# Patient Record
Sex: Male | Born: 1945
Health system: Southern US, Community
[De-identification: ages and names within clinical notes are randomized; demographics above are authoritative.]

## PROBLEM LIST (undated history)

## (undated) DIAGNOSIS — Z720 Tobacco use: Secondary | ICD-10-CM

## (undated) DIAGNOSIS — I2510 Atherosclerotic heart disease of native coronary artery without angina pectoris: Secondary | ICD-10-CM

## (undated) DIAGNOSIS — M51369 Other intervertebral disc degeneration, lumbar region without mention of lumbar back pain or lower extremity pain: Secondary | ICD-10-CM

## (undated) DIAGNOSIS — Z9289 Personal history of other medical treatment: Secondary | ICD-10-CM

## (undated) DIAGNOSIS — R0602 Shortness of breath: Secondary | ICD-10-CM

## (undated) DIAGNOSIS — I10 Essential (primary) hypertension: Secondary | ICD-10-CM

## (undated) DIAGNOSIS — I209 Angina pectoris, unspecified: Secondary | ICD-10-CM

## (undated) DIAGNOSIS — I219 Acute myocardial infarction, unspecified: Secondary | ICD-10-CM

## (undated) DIAGNOSIS — H269 Unspecified cataract: Secondary | ICD-10-CM

## (undated) DIAGNOSIS — E785 Hyperlipidemia, unspecified: Secondary | ICD-10-CM

## (undated) DIAGNOSIS — E663 Overweight: Secondary | ICD-10-CM

## (undated) DIAGNOSIS — I429 Cardiomyopathy, unspecified: Secondary | ICD-10-CM

## (undated) DIAGNOSIS — I739 Peripheral vascular disease, unspecified: Secondary | ICD-10-CM

## (undated) DIAGNOSIS — M5136 Other intervertebral disc degeneration, lumbar region: Secondary | ICD-10-CM

## (undated) HISTORY — DX: Cardiomyopathy, unspecified: I42.9

## (undated) HISTORY — DX: Peripheral vascular disease, unspecified: I73.9

## (undated) HISTORY — PX: OTHER SURGICAL HISTORY: SHX169

## (undated) HISTORY — DX: Atherosclerotic heart disease of native coronary artery without angina pectoris: I25.10

## (undated) HISTORY — DX: Tobacco use: Z72.0

## (undated) HISTORY — DX: Acute myocardial infarction, unspecified: I21.9

## (undated) HISTORY — DX: Overweight: E66.3

## (undated) HISTORY — PX: FEMORAL-FEMORAL BYPASS GRAFT: SHX936

## (undated) HISTORY — PX: CORONARY ANGIOPLASTY WITH STENT PLACEMENT: SHX49

## (undated) HISTORY — DX: Hyperlipidemia, unspecified: E78.5

## (undated) HISTORY — PX: KNEE SURGERY: SHX244

## (undated) HISTORY — PX: SPINE SURGERY: SHX786

## (undated) HISTORY — PX: COLONOSCOPY: SHX174

---

## 2001-02-25 ENCOUNTER — Inpatient Hospital Stay (HOSPITAL_COMMUNITY): Admission: RE | Admit: 2001-02-25 | Discharge: 2001-02-26 | Payer: Self-pay | Admitting: *Deleted

## 2001-02-25 ENCOUNTER — Encounter (INDEPENDENT_AMBULATORY_CARE_PROVIDER_SITE_OTHER): Payer: Self-pay | Admitting: *Deleted

## 2003-02-22 ENCOUNTER — Ambulatory Visit (HOSPITAL_COMMUNITY): Admission: RE | Admit: 2003-02-22 | Discharge: 2003-02-24 | Payer: Self-pay | Admitting: Cardiology

## 2003-09-15 ENCOUNTER — Ambulatory Visit (HOSPITAL_COMMUNITY): Admission: RE | Admit: 2003-09-15 | Discharge: 2003-09-15 | Payer: Self-pay | Admitting: Vascular Surgery

## 2003-09-17 ENCOUNTER — Emergency Department (HOSPITAL_COMMUNITY): Admission: EM | Admit: 2003-09-17 | Discharge: 2003-09-17 | Payer: Self-pay

## 2003-09-28 ENCOUNTER — Inpatient Hospital Stay (HOSPITAL_COMMUNITY): Admission: RE | Admit: 2003-09-28 | Discharge: 2003-10-06 | Payer: Self-pay | Admitting: Vascular Surgery

## 2004-03-30 ENCOUNTER — Ambulatory Visit: Payer: Self-pay | Admitting: Cardiology

## 2004-07-26 ENCOUNTER — Ambulatory Visit (HOSPITAL_COMMUNITY): Admission: RE | Admit: 2004-07-26 | Discharge: 2004-07-26 | Payer: Self-pay | Admitting: Vascular Surgery

## 2004-07-30 ENCOUNTER — Inpatient Hospital Stay (HOSPITAL_COMMUNITY): Admission: RE | Admit: 2004-07-30 | Discharge: 2004-08-02 | Payer: Self-pay | Admitting: Vascular Surgery

## 2004-07-30 ENCOUNTER — Ambulatory Visit: Payer: Self-pay | Admitting: Cardiology

## 2004-09-05 ENCOUNTER — Ambulatory Visit: Payer: Self-pay | Admitting: Cardiology

## 2004-10-22 ENCOUNTER — Ambulatory Visit: Payer: Self-pay | Admitting: Cardiology

## 2005-05-27 ENCOUNTER — Ambulatory Visit: Payer: Self-pay | Admitting: Cardiology

## 2006-01-23 ENCOUNTER — Ambulatory Visit: Payer: Self-pay | Admitting: Cardiology

## 2006-08-21 ENCOUNTER — Encounter: Payer: Self-pay | Admitting: Physician Assistant

## 2006-08-21 ENCOUNTER — Ambulatory Visit: Payer: Self-pay | Admitting: Cardiology

## 2006-08-27 ENCOUNTER — Ambulatory Visit: Payer: Self-pay | Admitting: Cardiology

## 2007-01-13 ENCOUNTER — Ambulatory Visit: Payer: Self-pay | Admitting: Cardiology

## 2007-05-05 ENCOUNTER — Ambulatory Visit: Payer: Self-pay | Admitting: Internal Medicine

## 2007-05-05 ENCOUNTER — Encounter: Payer: Self-pay | Admitting: Internal Medicine

## 2007-05-05 ENCOUNTER — Ambulatory Visit (HOSPITAL_COMMUNITY): Admission: RE | Admit: 2007-05-05 | Discharge: 2007-05-05 | Payer: Self-pay | Admitting: Internal Medicine

## 2007-05-05 HISTORY — PX: OTHER SURGICAL HISTORY: SHX169

## 2007-10-21 ENCOUNTER — Encounter: Payer: Self-pay | Admitting: Cardiology

## 2007-11-27 ENCOUNTER — Ambulatory Visit: Payer: Self-pay | Admitting: Cardiology

## 2007-11-27 ENCOUNTER — Encounter: Payer: Self-pay | Admitting: Physician Assistant

## 2007-12-29 ENCOUNTER — Ambulatory Visit: Payer: Self-pay | Admitting: Vascular Surgery

## 2008-07-27 ENCOUNTER — Ambulatory Visit: Payer: Self-pay | Admitting: Cardiology

## 2008-09-26 ENCOUNTER — Encounter: Payer: Self-pay | Admitting: Cardiology

## 2008-11-23 ENCOUNTER — Encounter: Payer: Self-pay | Admitting: Physician Assistant

## 2009-02-16 ENCOUNTER — Encounter: Payer: Self-pay | Admitting: Cardiology

## 2009-02-16 DIAGNOSIS — E663 Overweight: Secondary | ICD-10-CM | POA: Insufficient documentation

## 2009-02-16 DIAGNOSIS — I428 Other cardiomyopathies: Secondary | ICD-10-CM

## 2009-02-16 DIAGNOSIS — E785 Hyperlipidemia, unspecified: Secondary | ICD-10-CM

## 2009-02-16 DIAGNOSIS — I739 Peripheral vascular disease, unspecified: Secondary | ICD-10-CM | POA: Insufficient documentation

## 2009-02-16 DIAGNOSIS — I2510 Atherosclerotic heart disease of native coronary artery without angina pectoris: Secondary | ICD-10-CM | POA: Insufficient documentation

## 2009-02-17 ENCOUNTER — Ambulatory Visit: Payer: Self-pay | Admitting: Cardiology

## 2009-02-17 ENCOUNTER — Encounter: Payer: Self-pay | Admitting: Physician Assistant

## 2009-02-17 DIAGNOSIS — R0602 Shortness of breath: Secondary | ICD-10-CM

## 2009-02-17 DIAGNOSIS — R29898 Other symptoms and signs involving the musculoskeletal system: Secondary | ICD-10-CM

## 2009-02-17 DIAGNOSIS — E7800 Pure hypercholesterolemia, unspecified: Secondary | ICD-10-CM | POA: Insufficient documentation

## 2009-02-17 DIAGNOSIS — R609 Edema, unspecified: Secondary | ICD-10-CM

## 2009-02-17 DIAGNOSIS — Z9861 Coronary angioplasty status: Secondary | ICD-10-CM

## 2009-02-17 DIAGNOSIS — I10 Essential (primary) hypertension: Secondary | ICD-10-CM | POA: Insufficient documentation

## 2009-02-17 DIAGNOSIS — I70299 Other atherosclerosis of native arteries of extremities, unspecified extremity: Secondary | ICD-10-CM

## 2009-02-17 DIAGNOSIS — I2510 Atherosclerotic heart disease of native coronary artery without angina pectoris: Secondary | ICD-10-CM

## 2009-02-20 ENCOUNTER — Encounter (INDEPENDENT_AMBULATORY_CARE_PROVIDER_SITE_OTHER): Payer: Self-pay | Admitting: *Deleted

## 2009-10-11 ENCOUNTER — Ambulatory Visit: Payer: Self-pay | Admitting: Cardiology

## 2009-10-26 ENCOUNTER — Encounter (INDEPENDENT_AMBULATORY_CARE_PROVIDER_SITE_OTHER): Payer: Self-pay | Admitting: *Deleted

## 2009-10-27 ENCOUNTER — Encounter: Payer: Self-pay | Admitting: Cardiology

## 2009-11-03 ENCOUNTER — Encounter (INDEPENDENT_AMBULATORY_CARE_PROVIDER_SITE_OTHER): Payer: Self-pay | Admitting: *Deleted

## 2010-06-19 NOTE — Letter (Signed)
Summary: Generic Engineer, agricultural at [facility] [address]   [place], [place] [address]   Phone: [phone]  Fax: [phone]        October 26, 2009 MRN: [id number]    GEVORK AYYAD [address] [place], [place]  [address]    Dear Mr. Picha,    You were asked to have fasting lab work done following your May 25th office visit to check you cholesterol and liver function. It does not appear this lab work has been completed.  Please, take the enclosed order to the [facility] to have this lab work done as soon as possible. Do not eat or drink after midnight.   If you will not be able to complete this lab work, please notify our office so that we can properly document this in your chart.      Sincerely,  Cyril Loosen, RN, BSN  This letter has been electronically signed by your physician.

## 2010-06-19 NOTE — Letter (Signed)
Summary: Generic Engineer, agricultural at [facility] [address]. [address]   [place], [place] [address]   Phone: [phone]  Fax: [phone]        November 03, 2009 MRN: [id number]    Logan [name] [address] [place], [place]  [address]    Dear Mr. Saddler,   Enclosed you will find information regarding lowering your triglycerides and increasing your HDL as we discussed on Friday, November 03, 2009.      Sincerely,  Cyril Loosen, RN, BSN  This letter has been electronically signed by your physician.

## 2010-06-19 NOTE — Assessment & Plan Note (Signed)
Summary: 6 MO FU PER APRIL REMINDER-SRS  Medications Added METOPROLOL TARTRATE 50 MG TABS (METOPROLOL TARTRATE) Take 1 tablet by mouth two times a day LISINOPRIL 40 MG TABS (LISINOPRIL) Take 1 tablet by mouth once a day FISH OIL 1000 MG CAPS (OMEGA-3 FATTY ACIDS) Take 2 tablet by mouth two times a day SIMVASTATIN 20 MG TABS (SIMVASTATIN) Take 1 tablet by mouth once a day SMZ-TMP DS 800-160 MG TABS (SULFAMETHOXAZOLE-TRIMETHOPRIM) Take 1 tablet by mouth two times a day      Allergies Added:   Visit Type:  Follow-up Primary Provider:  Kirstie Peri, MD  CC:  CAD.  History of Present Illness: The patient is seen for followup of coronary disease.  I saw him last in October, 2010.  Is not having chest pain.  He does have some discomfort in his legs when walking but overall he is relatively stable.  Preventive Screening-Counseling & Management  Alcohol-Tobacco     Smoking Status: current     Smoking Cessation Counseling: yes     Packs/Day: 1 PPD  Current Medications (verified): 1)  Metoprolol Tartrate 50 Mg Tabs (Metoprolol Tartrate) .... Take 1 Tablet By Mouth Two Times A Day 2)  Metformin Hcl 500 Mg Tabs (Metformin Hcl) .... 2 Tabs Two Times A Day 3)  Glimepiride 4 Mg Tabs (Glimepiride) .... Two Times A Day 4)  Lisinopril 40 Mg Tabs (Lisinopril) .... Take 1 Tablet By Mouth Once A Day 5)  Actos 45 Mg Tabs (Pioglitazone Hcl) .... Once Daily 6)  Aspirin 81 Mg Tbec (Aspirin) .... Take One Tablet By Mouth Daily 7)  Lantus 100 Unit/ml Soln (Insulin Glargine) .... As Directed 8)  Fish Oil 1000 Mg Caps (Omega-3 Fatty Acids) .... Take 2 Tablet By Mouth Two Times A Day 9)  Multivitamins   Tabs (Multiple Vitamin) .... Once Daily 10)  Simvastatin 20 Mg Tabs (Simvastatin) .... Take 1 Tablet By Mouth Once A Day 11)  Smz-Tmp Ds 800-160 Mg Tabs (Sulfamethoxazole-Trimethoprim) .... Take 1 Tablet By Mouth Two Times A Day  Allergies (verified): 1)  ! Pcn 2)  ! Asa  Comments:  Nurse/Medical  Assistant: The patient's medications and allergies were reviewed with the patient and were updated in the Medication and Allergy Lists. Bottles reviewed.  Past History:  Past Medical History: Last updated: 02/17/2009  OVERWEIGHT (ICD-278.02) DYSLIPIDEMIA (ICD-272.4) CARDIOMYOPATHY (ICD-425.4)...ejection fraction 35% in the past improved to 50% by echo April, 2000 and DM (ICD-250.00) CAD (ICD-414.00)... taxis DES stent circumflex October, 2004.. residual total distal circumflex and 50% LAD / Cardiolite April, 2008.. inferior scar no ischemia PERIPHERAL VASCULAR DISEASE (ICD-443.9)..right to left fem-fem bypass 1997.../ aortobifemoral bypass 2005../ right to left fem-fem bypass March 2006.  Secondary to occluded right limb of aortobifemoral bypass.. tobacco abuse  Social History: Smoking Status:  current Packs/Day:  1 PPD  Review of Systems       Patient denies fever, chills, headache, sweats, rash, change in vision, change in hearing, chest pain, cough, shortness of breath, nausea vomiting, urinary symptoms.  All of the systems are reviewed and are negative  Vital Signs:  Patient profile:   65 year old male Height:      69 inches Weight:      237 pounds Pulse rate:   91 / minute BP sitting:   110 / 71  (left arm) Cuff size:   large  Vitals Entered By: Carlye Grippe (Oct 11, 2009 12:41 PM) CC: CAD Comments Pt requesting refill on Simvastatin. No FLP/LFT done per pt  since Sept. Pt will have labs done.    Physical Exam  General:  patient stable. Eyes:  no xanthelasma. Neck:  no jugular venous distention. Lungs:  lungs are clear respiratory effort is normal. Heart:  cardiac exam reveals S1 and S2.  There no clicks or significant. Abdomen:  abdomen soft. Msk:  no musculoskeletal deformities. Extremities:  no peripheral edema. Psych:  patient is oriented to person time and place affect is normal.   Impression & Recommendations:  Problem # 1:  EDEMA (ICD-782.3) Patient  is not having significant edema.  No change in therapy.  I did talk to him before but be careful with salt and fluid intake.  Problem # 2:  HYPERTENSION (ICD-401.9) Blood pressure is controlled.  No change in therapy.  Problem # 3:  SHORTNESS OF BREATH (ICD-786.05) Patient is not having any shortness of breath at this time.  No change in therapy.  Problem # 4:  CORONARY ATHEROSCLEROSIS NATIVE CORONARY ARTERY (ICD-414.01) Coronary disease is stable.  No further workup needed at this time.  Problem # 5:  DYSLIPIDEMIA (ICD-272.4)  His updated medication list for this problem includes:    Simvastatin 20 Mg Tabs (Simvastatin) .Marland Kitchen... Take 1 tablet by mouth once a day Patient is receiving meds for his lipids.  Problem # 6:  PERIPHERAL VASCULAR DISEASE (ICD-443.9) The patient's peripheral disease stable.  I reminded him that walking is very good for his problem.  Other Orders: T-Lipid Profile [id number]) T-Hepatic Function [phone])  Patient Instructions: 1)  Your physician wants you to follow-up in: 9 months. You will receive a reminder letter in the mail one-two months in advance. If you don't receive a letter, please call our office to schedule the follow-up appointment. 2)  Your physician recommends that you continue on your current medications as directed. Please refer to the Current Medication list given to you today. Prescriptions: SIMVASTATIN 20 MG TABS (SIMVASTATIN) Take 1 tablet by mouth once a day  #30 x 0   Entered by:   Cyril Loosen, RN, BSN   Authorized by:   Talitha Givens, MD, [facility]   Signed by:   Cyril Loosen, RN, BSN on 10/11/2009   Method used:   Electronically to        [facility] (retail)       [address]       [place], [place]  [address]       Ph: [phone]       Fax: [phone]   RxID:   [id number]

## 2010-07-23 ENCOUNTER — Ambulatory Visit (INDEPENDENT_AMBULATORY_CARE_PROVIDER_SITE_OTHER): Payer: Medicare HMO | Admitting: Cardiology

## 2010-07-23 ENCOUNTER — Encounter: Payer: Self-pay | Admitting: Cardiology

## 2010-07-23 DIAGNOSIS — I2510 Atherosclerotic heart disease of native coronary artery without angina pectoris: Secondary | ICD-10-CM

## 2010-07-23 DIAGNOSIS — F17200 Nicotine dependence, unspecified, uncomplicated: Secondary | ICD-10-CM | POA: Insufficient documentation

## 2010-07-31 NOTE — Assessment & Plan Note (Signed)
Summary: [name]  Medications Added HYDROCHLOROTHIAZIDE 25 MG TABS (HYDROCHLOROTHIAZIDE) Take 1 tablet by mouth once a day        Visit Type:  Follow-up Primary Provider:  Kirstie Peri, MD  CC:  CAD.  History of Present Illness: The patient is seen for followup of coronary artery disease.  Is not have any chest pain.  He is functioning adequately with his peripheral vascular disease.  Preventive Screening-Counseling & Management  Alcohol-Tobacco     Smoking Status: current     Smoking Cessation Counseling: yes     Packs/Day: 1/2 PPD  Allergies: 1)  ! Pcn 2)  ! [name]  Past History:  Past Medical History:  OVERWEIGHT (ICD-278.02) DYSLIPIDEMIA (ICD-272.4) CARDIOMYOPATHY (ICD-425.4)...ejection fraction 35% in the past improved to 50% by echo April, 2000 and DM (ICD-250.00) CAD (ICD-414.00)... taxis DES stent circumflex October, 2004.. residual total distal circumflex and 50% LAD / Cardiolite April, 2008.. inferior scar no ischemia PERIPHERAL VASCULAR DISEASE (ICD-443.9)..right to left fem-fem bypass 1997.../ aortobifemoral bypass 2005../ right to left fem-fem bypass March 2006.  Secondary to occluded right limb of aortobifemoral bypass.. tobacco abuse..  Social History: Packs/Day:  1/2 PPD  Review of Systems       Patient denies fever, chills, headache, sweats, rash, change in vision, change in hearing, chest pain, cough, nausea vomiting, urinary symptoms.  All of the systems are reviewed and are negative.  Vital Signs:  Patient profile:   65 year old male Height:      69 inches Weight:      244 pounds BMI:     36.16 Pulse rate:   92 / minute BP sitting:   120 / 79  (left arm) Cuff size:   large  Vitals Entered By: Carlye Grippe (July 23, 2010 3:18 PM)  Nutrition Counseling: Patient's BMI is greater than 25 and therefore counseled on weight management options.  Physical Exam  General:  The patient is stable. Eyes:  no xanthelasma. Neck:  no jugular venous  distention. Lungs:  lungs are clear.  Respiratory effort is nonlabored. Heart:  cardiac exam reveals S1-S2.  No clicks or significant murmurs. Abdomen:  abdomen soft. Extremities:  no peripheral edema. Psych:  patient is oriented to person time and place.  Affect is normal.   Impression & Recommendations:  Problem # 1:  EDEMA (ICD-782.3) no significant edema.  No change in therapy.  Problem # 2:  HYPERTENSION (ICD-401.9) Blood pressure control.  No change in therapy.  Problem # 3:  CORONARY ATHEROSCLEROSIS NATIVE CORONARY ARTERY (ICD-414.01) Coronary disease is stable.  EKG is done and reviewed by me.  There is no significant abnormality.  Further testing is needed.  Problem # 4:  DYSLIPIDEMIA (ICD-272.4) the patient's lipids are treated.  No change in therapy.  Problem # 5:  DYSLIPIDEMIA (ICD-272.4) The patient's LDL is treated.  Labs have been checked within the past year revealing some elevation of his triglycerides.  I have spoken with him about his diet.  Problem # 6:  TOBACCO ABUSE (ICD-305.1) The patient continues to smoke one half pack per day.  Counseled him to stop.  Other Orders: EKG w/ Interpretation (93000)  Patient Instructions: 1)  Your physician wants you to follow-up in: 1 year. You will receive a reminder letter in the mail one-two months in advance. If you don't receive a letter, please call our office to schedule the follow-up appointment. 2)  Your physician recommends that you continue on your current medications as directed. Please refer to  the Current Medication list given to you today.

## 2010-10-02 NOTE — Assessment & Plan Note (Signed)
[name]                          [facility] OFF[facility] NOTE   EREZ, MCCALLUM                         MRN:          [id number]  DATE:07/27/2008                            DOB:          [birth year]    Logan [name] is stable.  He has known coronary artery disease and  significant peripheral vascular disease.  He does have  hypercholesterolemia.  Crestor has become too expensive for him and we  are suggesting that it be changed to simvastatin.  He did have some  problems taking Pravachol in the past.  He will have to be followed to  be sure that he feels well on simvastatin.   The patient is not having any significant chest pain.  He has no  significant shortness of breath.  He is going about full activities.   PAST MEDICAL HISTORY:   ALLERGIES:  PENICILLIN.  He tolerates low-dose aspirin.   MEDICATIONS:  Metoprolol, Crestor (to be switched to simvastatin),  metformin, glimepiride, lisinopril, Actos, aspirin, Lantus, fish oil,  and multivitamin.   OTHER MEDICAL PROBLEMS:  See the list on the note of November 27, 2007.   REVIEW OF SYSTEMS:  He is not having any fevers or chills.  He has no  skin rashes.  He has no GI or GU symptoms.  There are no headaches.   REVIEW OF SYSTEMS:  Otherwise is negative.   PHYSICAL EXAMINATION:  Blood pressure is 135/81 with a pulse of 85.  His  weight is up to 252.  The patient is oriented to person, time, and place.  Affect is normal.  HEENT:  No xanthelasma.  He has normal extraocular motion.  There are no  carotid bruits.  There is no jugular venous distention.  LUNGS:  Clear.  Respiratory effort is not labored.  CARDIAC:  S1 and S2.  There are no clicks or significant murmurs.  ABDOMEN:  Soft.  There is no significant peripheral edema.   No labs were done today.   Problems are listed extensively on the note of November 27, 2007.  I had a  long discussion with him about his medications.  His coronary artery  disease is stable.  He does not need any further testing.  Hyperlipidemia has been treated with Crestor.  He cannot afford it and  he will be  switched to simvastatin.  He will need followup labs.  I will see him  back for Cardiology followup in 6 months.     Luis Abed, MD, [facility]  Electronically Signed    JDK/MedQ  DD: 07/27/2008  DT: 07/28/2008  Job #: [id number]   cc:   Kirstie Peri, MD

## 2010-10-02 NOTE — Assessment & Plan Note (Signed)
[facility]                          [facility] NOTE   Logan [name], Logan [name]                         MRN:          [id number]  DATE:11/27/2007                            DOB:          [birth year]    CARDIOLOGIST:  Luis Abed, MD, [facility].   PRIMARY CARE PHYSICIAN:  Kirstie Peri, MD.   REASON FOR FOLLOWUP:  A 43-month followup.   HISTORY OF PRESENT ILLNESS:  Logan [name] is a 65 year old male patient  with a history of significant peripheral arterial disease as well as  coronary artery disease who presents to the office day for followup.  He  last saw Dr. Myrtis Ser in August 2008.  At that time, he was doing well.  Since last being seen, he denies any development of exertional chest  discomfort.  He does have exertional shortness of breath.  He describes  NYHA class IIB symptoms.  He denies any significant change.  He denies  orthopnea, PND, or pedal edema.  He denies syncope or near syncope.  He  does note some bilateral lower extremity numbness with exertion that  seems to go away with rest.  He says that these are similar symptoms to  what he had prior to his revascularization surgery for his lower  extremity peripheral arterial disease.   CURRENT MEDICATIONS:  1. Metformin 500 mg 2 tablets b.i.d.  2. Actos 45 mg daily.  3. Aspirin 81 mg daily.  4. Metoprolol 25 mg b.i.d.  5. Crestor 10 mg nightly.  6. Fish oil 1000 mg 2 tablets b.i.d.  7. Glimepiride 4 mg b.i.d.  8. Lantus insulin 65 units daily.   PHYSICAL EXAMINATION:  GENERAL:  He is a well nourished and well  developed man in no distress.  VITAL SIGNS:  Blood pressure 144/88, pulse 78, and weight 246.8 pounds.  HEENT:  Normal.  NECK:  Without JVD.  LYMPHATICS:  No lymphadenopathy.  CARDIAC:  Normal S1 and S2.  Regular rate and rhythm without murmur.  LUNGS:  Clear to auscultation bilaterally.  ABDOMEN:  Soft and nontender.  EXTREMITIES:  1+ edema bilaterally.  NEUROLOGIC:  He is  alert and oriented x3.  Cranial nerves II-XII are  grossly intact.  VASCULAR:  No carotid bruits noted bilaterally.  Dorsalis pedis and  posterior tibialis pulses are difficult to palpate bilaterally.   Electrocardiogram reveals sinus rhythm with a heart rate of 78, normal  axis, and no acute changes.   IMPRESSION:  1. Bilateral lower extremity exertional numbness.  2. Peripheral arterial disease.      a.     Status post left-to-right femoral-femoral bypass graft,       March 2006, secondary to an occluded right limb of the       aortobifemoral bypass graft.      b.     Status post multiple prior left lower extremity bypass       grafts:  Right-to-left femoral-femoral bypass graft in 1997;       aortobifemoral bypass graft in 2005.  3. Coronary artery disease.      a.  Status post Taxus stenting to the circumflex, October 2004.      b.     Residual totally occluded distal circumflex and 50% ostial       left anterior descending.      c.     Cardiolite study in April 2008 with inferior scar.  No       ischemia.  4. Ischemic cardiomyopathy with a previous ejection fraction of 35%.      a.     Ejection fraction improved to 50-55% by echocardiogram in       April 2008.  5. Diabetes mellitus.  6. Hyperlipidemia.  7. Obesity.   PLAN:  1. Mr. Wernick returns for followup.  He does complain of some      exertional leg numbness with relief upon rest.  This is similar to      what he has had in the past with his peripheral arterial disease.      This is somewhat new.  We will set the patient up for ankle-      brachial indexes and arterial Dopplers to reassess his peripheral      arterial disease.  2. He is a diabetic and his blood pressure is elevated.  He was on      lisinopril in the past.  I have talked to him briefly about the      benefits of ACE inhibitors on diabetics.  He would prefer to follow      up with his primary care physician to further discuss this.  3. The patient  will be brought back in follow up with Dr. Myrtis Ser in the      next 6 months or sooner p.r.n.      Tereso Newcomer, PA-C  Electronically Signed      Luis Abed, MD, [facility]  Electronically Signed   SW/MedQ  DD: 11/27/2007  DT: 11/28/2007  Job #: [id number]   cc:   Kirstie Peri, MD

## 2010-10-02 NOTE — Op Note (Signed)
NAME:  Logan [name], Logan [name]                  ACCOUNT NO.:  [id number]   MEDICAL RECORD NO.:  [id number]          PATIENT TYPE:  AMB   LOCATION:  DAY                           FACILITY:  [facility]   PHYSICIAN:  R. Roetta Sessions, M.D. DATE OF BIRTH:  [birth year]   DATE OF PROCEDURE:  05/05/2007  DATE OF DISCHARGE:                               OPERATIVE REPORT   PROCEDURE PERFORMED:  Ileocolonscopy and snare polypectomy.   INDICATIONS FOR PROCEDURE:  A 65 year old [place] American gentleman  with history of colonic adenomas removed from his colon previously.  His  last colonoscopy was in 2000 at [facility].  At that time he had  no polyps.  He has no lower GI tract symptoms.  There is no family  history of colorectal neoplasia.  He is here now for surveillance.  This  approach has been discussed with the patient at length.  The potential  risks, benefits and alternatives have been reviewed.  Questions are  answered.  He is agreeable.  Please see the documentation in the medical  record.   PROCEDURE NOTE:  O2 saturation, blood pressure, pulse and respirations  were monitored throughout the entire procedure.  Conscious sedation with  Versed 5 mg IV and Demerol 75 mg IV in divided doses.   INSTRUMENT USED:  Pentax video chip system.   FINDINGS:  Digital rectal examination revealed no abnormalities.  The  prep was adequate.   COLON:  Colonic mucosa was surveyed from the rectosigmoid junction to  the left transverse right colon.  The appendiceal orifice, ileocecal  valve and cecum __________  were well seen and photographed for the  record.  The terminal ileum was then surveyed to 10 cm, from this level  the scope was slowly withdrawn.  All previous mucosal surfaces were  again seen.  The patient was noted to have the following abnormalities.  1. Pancolonic diverticula.  2. A 5 mm pedunculated polyp in the mid descending colon, and a second      7 mm pedunculated polyp.  The smaller one was  cold snared, the      larger was hot snared.  Both polyps were recovered through the      scope.  The remainder of the colonic mucosa appeared normal.  The      scope was pulled down to the rectum, where a thorough examination      of the rectal mucosa including retroflexed view of the anal verge,      demonstrated only a prominent anal papilla.   The patient tolerated the procedure well and was reacted in endoscopy.   IMPRESSION:  1. Anal papilla, otherwise normal rectum pancolonic diverticula.  2. Pedunculated mid descending colon polyps, removed with snare; as      described above.  The remainder of the colonic mucosa and terminal      ileum mucosa appeared normal.   RECOMMENDATIONS:  Diverticulosis literature provided to Mr. Schupp.  Prompt on pathology.  Further recommendations are to follow.      Jonathon Bellows, M.D.  Electronically Signed  RMR/MEDQ  D:  05/05/2007  T:  05/05/2007  Job:  [id number]

## 2010-10-02 NOTE — Assessment & Plan Note (Signed)
OFFICE VISIT   DHANUSH, JOKERST  DOB:  [birth year]                                       12/29/2007  WUJWJ#:[id number]   This is an office visit.  The patient is a patient well known to our  practice having multiple vascular procedures including aortobifemoral  bypass, multiple right to left femoral-femoral bypass grafts and a left  popliteal to posterior tibial bypass graft many years ago which is known  to be occluded.  He has stable claudication in his left leg, ambulating  about one block.  He has no history of rest pain but does occasionally  develop numbness in the left foot with ambulation.  He has had no  nonhealing ulcers or gangrene.  He denies any neurologic symptoms such  as TIAs, amaurosis fugax, diplopia, blurred vision or syncope and has no  chest pain.   PHYSICAL EXAM:  Vital signs:  Blood pressure 122/76, heart rate 70,  respirations 14.  Neck:  Carotid pulses are 3+, no audible bruits.  Neurological:  Normal.  No palpable adenopathy in the neck.  Chest:  Clear to auscultation.  Abdomen:  Is obese.  No palpable masses.  Vascular:  He has 3+ femoral pulses and a pulse in the femoral-femoral  crossover graft at 3+.  The right leg has a 2-3+ popliteal and 2-3+  posterior tibial pulse with 1+ edema.  The left leg has absent popliteal  and distal pulses.  Both feet are adequately perfused.  ABIs done  recently at [facility] are 0.67 on the left and 1.0 on the  right.   He is certainly stable and has stable claudication and the only reason  to do further bypasses in this gentleman with severe vascular disease  would be if he develops limb threatening ischemia or severe claudication  which he does not have at the present time.  If he has worsening of the  symptoms he will be back in touch with [place].   Quita Skye Hart [place], M.D.  Electronically Signed   JDL/MEDQ  D:  12/29/2007  T:  12/30/2007  Job:  [id number]   cc:   Luis Abed, MD, [facility]  Tereso Newcomer, PA-C

## 2010-10-02 NOTE — Assessment & Plan Note (Signed)
[facility] HEALTHCARE                          [place] CARDIOLOGY OFFICE NOTE   [name], Logan                         MRN:          [id number]  DATE:01/13/2007                            DOB:          Nov 29, 1945    Logan [name] is actually doing well. He had been seen in the office in  April of 2008. At that time, decision was made to proceed with a  followup echocardiogram. Fortunately, he has had improvement in his LV  function, and his ejection fraction is 50-55% with some posterolateral  hypokinesis. He does have some diastolic dysfunction. There is trace MR  and trace TR. Since that time, he is doing relatively well. He is not  having any chest pain or shortness of breath. He has had no syncope or  presyncope.   PAST MEDICAL HISTORY:  ALLERGIES:  PENICILLIN.   MEDICATIONS:  1. Metformin.  2. Actos.  3. Aspirin.  4. Fish oil.  5. Metoprolol.  6. Lantus.  7. Crestor.  8. Multivitamin.   OTHER MEDICAL PROBLEMS:  See the complete list below.   REVIEW OF SYSTEMS:  He actually has no major complaints at this time.  Review of systems otherwise is negative.   PHYSICAL EXAMINATION:  Blood pressure today is 131/84 with a pulse of  85. His weight is 240 pounds. The patient is oriented to person, time  and place. Affect is normal.  HEENT:  Reveals no xanthelasma. He has normal extraocular motion. There  are no carotid bruits. There is no jugular venous distention.  LUNGS:  Are clear. Respiratory effort is not labored.  CARDIAC EXAM:  Reveals a S1 with a S2. There is a 2/6 crescendo,  decrescendo systolic murmur.  The abdomen is soft. He has normal bowel sounds.  There is trace peripheral edema. There are no major musculoskeletal  deformities.   Logan [name] is stable. Problems include:  1. Coronary disease post high-grade circumflex stenosis treated with      drug-eluting stent in the past.  2. Diabetes.  3. Peripheral vascular disease.  4. Lipid  abnormalities treated with Crestor.  5. History of nausea from penicillin.  6. History of right knee surgery.  7. Status post aorta to right common femoral and left profunda femoral      bypass graft in 2005 and status post FEM-FEM surgery.  8. Ejection fraction. Most recent one was 50-55% in April of 2008.  9. Residual 100% distal circumflex stenosis and 50% ostial left      anterior descending artery and a Cardiolite scan with no ischemia      in October 2005.  10.Obesity.  11.Smoking, and of course, he is always encouraged to smoke no longer.   Logan [name] is stable. I have not changed any of his medications. Because  he is diabetic, considering adding an ACE inhibitor would certainly be  worthwhile. I have chosen not to push for more medications at this time,  but he will be seeing Dr. Sherryll Burger soon.     Luis Abed, MD, [facility]  Electronically Signed    JDK/MedQ  DD: 01/13/2007  DT: 01/13/2007  Job #: [id number]   cc:   Kirstie Peri, MD

## 2010-10-05 NOTE — H&P (Signed)
NAME:  Logan [name], Logan [name]                  ACCOUNT NO.:  [id number]   MEDICAL RECORD NO.:  [id number]          PATIENT TYPE:  OIB   LOCATION:                               FACILITY:  [facility]   PHYSICIAN:  Quita Skye. Hart Rochester, M.D.  DATE OF BIRTH:  1945-12-26   DATE OF ADMISSION:  07/26/2004  DATE OF DISCHARGE:                                HISTORY & PHYSICAL   CHIEF COMPLAINT:  Recurrent right leg claudication symptoms for two months.   HISTORY OF PRESENT ILLNESS:  Mr. Fluckiger is a 65 year old African American  male patient who underwent aortobifemoral bypass grafting by Dr. Hart Rochester, in  May 2005, for severe aortoiliac occlusive disease and bilateral claudication  symptoms.  His bypass extended to the right common femoral on the left  profundis femoris artery.  He had a chronically occluded lower extremity  graft down to the tibial vessels on the left, performed by Dr. Nedra Hai many  years ago and this has been stable.  He had excellent resolution of his  symptoms for six months but two months ago developed recurrent symptoms in  the right leg only, beginning in the buttocks extending into the calf and  foot, after walking less than a block with numbness and tingling in the foot  and occasional rest pain.  He has had no non-healing ulcers.  Upon return  today for further evaluation, he was found to have an ABI of 0.43 on the  right compared to 1.0 in October 2005 and a 0.59 on the left.  He will be  admitted for left to right femoral femoral bypass grafting.   PAST MEDICAL HISTORY:  1.  Diabetes mellitus, type 2.  2.  Hyperlipidemia.  3.  Obesity.  4.  Coronary artery disease, status post PTCA and stenting, October 2004.  Negative  for stroke, emphysema, or hypertension.   PREVIOUS SURGERY:  1.  Hemorrhoidectomy.  2.  Right knee arthroscopy.  3.  Left post auricular lymph node removal, in 2002, which was benign.  4.  Several left lower extremity  bypass grafts by Dr. Micah Noel, in 1995,      with  left femoral to tibial graft and a left popliteal to posterior      tibial vein grafts with thrombectomy of the same in 1997.  A new right      to left femoral femoral bypass graft by Dr. Hart Rochester in 1997.  Also      aortobifemoral bypass graft by Dr. Hart Rochester in 2005.   MEDICATIONS:  1.  Glucophage 500 mg b.i.d.  2.  Actos 45 mg once a day.  3.  Coated aspirin one per day.   ALLERGIES:  1.  Develops a rash with PENICILLIN.  2.  Had a bleeding peptic ulcer on ASPIRIN but no problems with enteric      coated aspirin.   FAMILY HISTORY:  Mother died age 19 had a history of arthritis.  Had an  uncle died at age 79 with arthritis.   SOCIAL HISTORY:  He married and has three children.  He is a  retired  Curator.  He smoked from 84 to 2002.  He currently does not smoke and  does not use alcohol.   REVIEW OF SYSTEMS:  Denies any dyspnea on exertion, PND, orthopnea, angina  pectoris.  He has no history of non-healing ulcers.  No current GI symptoms  or GU symptoms.   PHYSICAL EXAMINATION:  VITAL SIGNS:  Blood pressure 158/80, heart rate is  94, respirations are 18.  GENERAL:  He is a healthy-appearing male in no apparent distress.  He is  alert and oriented  x 3.  NECK:  Supple.  He has 3+ carotid pulses palpable.  No bruits are audible.  No palpable adenopathy in the neck.  NEUROLOGIC:  Normal.  UPPER EXTREMITIES:  Pulses are 2 to 3+ bilaterally.  CHEST:  Clear to auscultation.  CARDIOVASCULAR:  Reveals a regular rhythm.  No murmurs.  ABDOMEN:  Obese.  No palpable masses.  LOWER EXTREMITIES:  Left leg has a 3+ femoral with absent distal pulses.  No  evidence of ischemia.  Right leg has an absent femoral and distal pulses  which the right foot is slightly cool and pale with no evidence of  infection.   IMPRESSION:  1.  Occlusion to right limb of aortobifemoral bypass graft with recurrent      severe claudication symptoms right leg.  2.  Coronary artery disease, status post  percutaneous transluminal coronary      angioplasty in 2004, now stable.      1.  He had a stress test done, in the Fall 2005, which was unremarkable.   PLAN:  We will admit, the patient on July 30, 2004, for an elective left to  right femoral femoral bypass graft.  The risks have been discussed with the  patient in the office and he is ready to proceed.       ___________________________________________  Quita Skye Hart Rochester, M.D.    JDL/MEDQ  D:  07/17/2004  T:  07/17/2004  Job:  [id number]   cc:   Marylene Land, Dr.

## 2010-10-05 NOTE — Op Note (Signed)
NAME:  ACIE, CUSTIS NO.:  [id number]   MEDICAL RECORD NO.:  [id number]                   PATIENT TYPE:  INP   LOCATION:  2313                                 FACILITY:  [facility]   PHYSICIAN:  Quita Skye. Hart Rochester, M.D.               DATE OF BIRTH:  Apr 18, 1946   DATE OF PROCEDURE:  09/28/2003  DATE OF DISCHARGE:                                 OPERATIVE REPORT   PREOPERATIVE DIAGNOSES:  Severe aortoiliac occlusive disease with occluded  femoral-femoral bypass and severe claudication of both lower extremities.   POSTOPERATIVE DIAGNOSES:  Severe aortoiliac occlusive disease with occluded  femoral-femoral bypass and severe claudication of both lower extremities.   OPERATION/PROCEDURE:  Aorto to right common femoral and left profunda  femoris bypass using a 14 x 8 mm Hemashield Dacron graft.   SURGEON:  Dr. Hart Rochester.   FIRST ASSISTANT:  Dr. Tawanna Cooler Early.   SECOND ASSISTANT:  Pecola Leisure.   ANESTHESIA:  General endotracheal.   PROCEDURE:  The patient was taken to the operating room and placed in the  supine position at which time, satisfactory general endotracheal anesthesia  was administered.  Anesthesia inserted a Swan-Ganz catheter and a left  radial arterial line for monitoring purposes.  The abdomen and groins were  prepped with Betadine scrub and solution and draped in the routine sterile  manner.  The incisions were made through the previous scars in the inguinal  regions bilaterally, carried down through scar tissue and the femoral-  femoral cross over graft which was chronically occluded  was dissected free  as was the common, superficial and profunda femoris arteries bilaterally.  On the right side, the common femoral artery was patent.  On the left side,  the common femoral artery was occluded and the profunda was dissected down  passed its secondary branches.  The midline incision was then made from  xiphoid to pubis, carried down through the  subcutaneous tissue and the linea  alba using the Bovie.  The peritoneal cavity was entered and thoroughly  explored.  The stomach, duodenum, small bowel and colon were unremarkable.  The liver was smooth with no palpable masses.  The gallbladder appeared  normal.  No stones were palpable.  The transverse colon was elevated and the  intestines were reflected to the right side.  The retroperitoneum was  incised, exposing the aorta from the renal arteries for bifurcation.  Circumferential control of the aorta was obtained and it was relatively free  of disease.  The iliac arteries were severely diseased bilaterally with  calcific plaque.  Retroperitoneal tunnels were created posterior to the  ureters.  The patient was given 25 g of Mannitol and then heparinized.  The  aorta was occluded distal to the renal arteries, transected 2 cm distally  and the distal end oversewn with two layers of 3-0 Prolene.  A 14 x 8 mm  Hemashield  Dacron graft was anastomosed end-to-end to the aortic stump with  continuous 3-0 Prolene, buttressing this with a strip of felt.  This was  checked for leaks.  None were present.  The limbs were developed through the  retroperitoneal tunnels.  On the right side, the femoral-femoral graft was  excised from the common femoral artery and some superficial laminated  thrombus was removed from the femoral artery leaving a very smooth surface  with good back bleeding from the profunda and the superficial femoral artery  on the right.  End-to-side anastomosis was done from graft to femoral artery  with 5-0 Prolene.  On the left side, the common femoral artery was  chronically occluded as was the superficial femoral artery.  The profunda  was transected at its origin and limited endarterectomy performed and an end-  to-end anastomosis was done between the left limb of the graft and the  profunda with a 6-0 Prolene.  Following this, the legs were opened  sequentially with no  significant hypotension.  Protamine was given to  reverse the heparin following adequate hemostasis.  The wounds were  irrigated with saline, closed in layers with Vicryl in a subcuticular  fashion.  The retroperitoneum was approximated over the graft with 3-0  Vicryl, the linea alba with #1 Prolene, the skin with clips and sterile  dressing applied.  The patient was taken to the recovery room in  satisfactory condition.  He received two units of packed blood cells from  the blood bank.  No Cell Saver blood, had an excellent urinary output and  was stable hemodynamically throughout the case.                                               Quita Skye Hart Rochester, M.D.    JDL/MEDQ  D:  09/28/2003  T:  09/29/2003  Job:  [id number]

## 2010-10-05 NOTE — Discharge Summary (Signed)
   NAME:  ADDIS, TUOHY NO.:  [id number]   MEDICAL RECORD NO.:  [id number]                   PATIENT TYPE:  OIB   LOCATION:  6531                                 FACILITY:  [facility]   PHYSICIAN:  Vida Roller, M.D.                DATE OF BIRTH:  June 30, 1945   DATE OF ADMISSION:  02/22/2003  DATE OF DISCHARGE:  02/24/2003                           DISCHARGE SUMMARY - REFERRING   ADDENDUM:  This is an addendum that will outline the plan for Mr. Lover.  Basically, there are some medications that are new that have been added:  1. He will go home on an ACE inhibitor, lisinopril, 10 mg daily.  2. Zocor 40 mg tablet one half tablet bedtime daily.  3. He will also have followup with Dr. Myrtis Ser in the [facility] office Wednesday,     March 02, 2003 at 11:30 in the morning, and he will have a fasting     lipid profile October 13 at 8:30 in the morning. It is also anticipated     that he will start on a beta blocker at the office visit on October 13,     most certainly for a decreased ejection fraction which is 35% by heart     catheterization October 5. Beta blocker of choice being possibly Coreg     with up titration.      Maple Mirza, P.A.                    Vida Roller, M.D.    GM/MEDQ  D:  02/24/2003  T:  02/24/2003  Job:  [id number]   cc:   Sherryll Burger, M.D.   [facility]  [address]  [place], [place] [address]

## 2010-10-05 NOTE — H&P (Signed)
NAME:  Logan [name], Logan [name]                            ACCOUNT NO.:  [id number]   MEDICAL RECORD NO.:  [id number]                   PATIENT TYPE:  INP   LOCATION:  NA                                   FACILITY:  [facility]   PHYSICIAN:  Quita Skye. Hart Rochester, M.D.               DATE OF BIRTH:  1945-12-19   DATE OF ADMISSION:  09/28/2003  DATE OF DISCHARGE:                                HISTORY & PHYSICAL   PRIMARY CARE PHYSICIAN:  A.C. Sherryll Burger, M.D.   CARDIOLOGIST:  Willa Rough, M.D.   CHIEF COMPLAINT:  Leg pain when walking for seven to eight months.   HISTORY OF PRESENT ILLNESS:  Logan [name] is a 65 year old African-American  male whose past medical history is significant for severe peripheral  vascular occlusive disease, requiring multiple surgeries which include a  left femoral to posterior tibial artery bypass graft and insertion of a  right femoral to left femoral bypass graft. He has required several  thrombectomies of these grafts in the past. Most recently in September, he  reported increasing leg pain with walking. Pain was worse in the right leg  than in the left. His pain was so severe that he eventually presented to the  emergency department. Following this visit, he was referred to Dr. Hart Rochester  for evaluation of his peripheral vascular occlusive disease. He was seen by  Dr. Hart Rochester at the [facility] office on February 15, 2003. At that time, ABIs were  done showing a reading of 0.36 on the right and 0.58 on the left. Dr. Hart Rochester  felt this was consistent with bilateral iliac occlusions. Based on these  findings and the patient's symptoms, Dr. Hart Rochester did feel the patient would  benefit from an aortobifemoral bypass graft; however, before scheduling it,  the patient did undergo a Cardiolite study which was positive. The patient  did eventually require PTCA and stenting of his coronary arteries followed  by six month course of Plavix. It was felt that after his six month course  of Plavix had expired  it would be safe to resume scheduling for his  aortobifemoral bypass grafting surgery. The patient has been off Plavix  since September 15, 2003. On the same day, he also underwent an abdominal  aortogram with bilateral lower extremity run via left transbrachial  approach. Findings showed total occlusion of the left common iliac artery,  left external iliac artery, and left femoral artery with patent left  profunda femoris artery. In addition, it showed chronic occlusion of the  left superficial femoral artery with patent popliteal artery below the knee  and one vessel run off to the peroneal artery, and occlusion of the right  external iliac artery with patent right common femoral, superficial femoral,  and profunda femoris artery, and good run off below the knee on the right.  After discussing the risks, benefits, and alternatives with Mr. [name], the  patient did  agreed to proceed, and his surgery was tentatively scheduled for  Sep 28, 2003. The patient does deny rest pain or skin ulcers.   PAST MEDICAL HISTORY:  1. Peripheral vascular occlusive disease.  2. Diabetes mellitus type 2.  3. History of back injury secondary to a fall in 1993.  4. Hyperlipidemia.  5. Obesity.  6. Coronary artery disease status post PTCA and stenting in October 2004.   PAST SURGICAL HISTORY:  1. Status post PTCA and stenting for coronary artery disease in October     2004.  2. History of left post auricular lymph node removal in 2002 which was     reportedly benign.  3. History of hemorrhoidectomy and polypectomy in 1998.  4. History of right knee arthroscopic surgery in April of 1993.  5. On May 01, 1992, he underwent a retroperitoneal approach to the left     common and external iliac arteries with endarterectomy and Dacron patch     angioplasty as well as a left above knee to superficial femoral artery to     posterior tibial artery bypass graft using a reversed saphenous vein     graft by Dr.  Nedra Hai.  6. On January 17, 1994, he underwent a left femoral thrombectomy with a right     to left femoral to femoral bypass graft by Dr. Madilyn Fireman using an 8-mm Dacron     graft.  7. On January 17, 1994, he underwent a thrombectomy of an occluded left     femoral to tibial bypass graft with an endarterectomy of the proximal     anastomosis and Gortex patch angioplasty by Dr. Nedra Hai.  8. On February 08, 1994, he underwent a thrombectomy of the left popliteal     to posterior tibial saphenous vein graft bypass graft by Dr. Nedra Hai.  9. On November 29, 1995, he underwent a thrombectomy of his right to left     femoral to femoral bypass graft with Dacron patch angioplasty of the left     common femoral artery.  10.      On December 01, 1995, he underwent insertion of a new right femoral to     left femoral bypass graft using an 8-mm Dacron Hemashield graft by Dr.     Hart Rochester.   MEDICATIONS:  1. Lantus insulin 35 units subcutaneously q.h.s.  2. Enteric-coated aspirin 325 mg one p.o. q.d.  3. Actos 30 mg one p.o. q.d.  4. Glucophage 1,000 mg p.o. b.i.d.  5. Percocet one to two tablets p.o. q.4-6h. p.r.n. pain.  6. He reportedly was told by Dr. Myrtis Ser that he may discontinue taking Plavix     75 mg one p.o. q.d. His last dose was on September 15, 2003.  7. Zocor 20 mg one half tablet p.o. q.h.s.; however, he quit this medication     on his own on September 15, 2003 due to joint pains. He is yet to discuss     these side effects with his primary physician.   ALLERGIES:  He develops a rash with PENICILLIN and has developed a bleeding  peptic ulcer on ASPIRIN; however, he was done well on enteric-coated  aspirin.   REVIEW OF SYSTEMS:  Please refer to history for significant positives and  negatives. In addition, he denies lower extremity swelling, chest pain,  shortness of breath, dyspnea on exertion, nausea, vomiting, diarrhea, or hematochezia. He does occasional mild urinary discomfort but denies urinary  frequency or  hematuria.   FAMILY HISTORY:  His  mother died at age 46 and had a history of arthritis.  He also had a uncle who died at age 36 with history of arthritis.   SOCIAL HISTORY:  He is married with three children. He is a retired  Curator. He denies alcohol use. He smoked one pack of cigarettes per day  from 1959 to 2002. He currently does not smoke.   PHYSICAL EXAMINATION:  VITAL SIGNS:  Blood pressure 142/92 in his left arm,  pulse 88, respirations 20.  GENERAL:  This is a 65 year old African-American male who was moderately  obese. He is alert, cooperative, and in no acute distress.  HEENT:  His head is normocephalic, atraumatic. His pupils are equal, round,  and reactive to light and accommodation. His extraocular movements are  intact. His sclerae are nonicteric. He does have complete upper dentures. He  is missing several of his lower teeth. His oral mucous membranes are pink  and moist. No lesions or erythema are noted.  NECK:  His neck is supple. No JVD, lymphadenopathy, or thyromegaly was  noted. He does have 2+ carotid pulses. No bruits were auscultated.  CHEST:  His chest is symmetrical on inspiration. His lung sounds were clear  throughout.  CARDIAC:  His heart has a regular rate and rhythm. No murmurs, rubs, or  gallops  were noted.  ABDOMEN:  His abdomen is soft, nontender, nondistended. His bowel sounds are  normal active x4 quadrants. No masses or hepatosplenomegaly were noted.  GENITOURINARY/RECTAL:  These exams were deferred.  EXTREMITIES:  His extremities are warm and without edema or ulcerations. He  has nonpalpable femoral pulses. He does have brisk Doppler signals of his  right posterior tibial and dorsalis pedis pulses. He has fairly weak Doppler  signals on the left of his dorsalis pedis and posterior tibial pulses. His  pulses are otherwise nonpalpable in his lower extremities.  NEUROLOGICAL:  His neurological exam is grossly intact. He is alert and  oriented x4.  His speech is clear, and his gait is steady. His bilateral  upper and lower muscle strength is 5/5.   ASSESSMENT/PLAN:  Mr. Goyer is a 65 year old African-American male with  significant history of peripheral vascular occlusive disease requiring  multiple surgeries. He now presents with debilitating lower extremity  claudication symptoms that have been worsening over the past seven to eight  months. Based on his symptoms and diagnostic findings, he will be admitted  to [facility] on Sep 28, 2003 where he will undergo an  aortobifemoral bypass graft by Dr. Josephina Gip. Dr. Hart Rochester has seen and  evaluated this patient prior to this admission and has explained the risks,  benefits of the procedure, and the patient has agreed to proceed. The  patient was given one bottle of mag citrate with instructions for use to  take prior to surgery.     Jerold Coombe, P.A.                  Quita Skye Hart Rochester, M.D.    AWZ/MEDQ  D:  09/26/2003  T:  09/26/2003  Job:  [id number]   cc:   Leia Alf. Sherryll Burger, M.D.   Willa Rough, M.D.

## 2010-10-05 NOTE — Consult Note (Signed)
NAME:  Logan [name], Logan [name]                  ACCOUNT NO.:  [id number]   MEDICAL RECORD NO.:  [id number]          PATIENT TYPE:  INP   LOCATION:  2899                         FACILITY:  [facility]   PHYSICIAN:  Olga Millers, M.D. [facility] OF BIRTH:  Oct 26, 1945   DATE OF CONSULTATION:  07/30/2004  DATE OF DISCHARGE:                                   CONSULTATION   PRIMARY CARE PHYSICIAN:  Dr. Clelia Croft, in [place] and Dr. Myrtis Ser, also in [place] at [address], [place], [place] [address].   SUMMARY OF HISTORY:  Logan [name] is a 65 year old African-American male who is  followed long term by [facility] for peripheral vascular disease.  He was seen on  July 17, 2004, by Dr. Hart Rochester and the patient had been complaining of  reoccurring right leg discomfort in the hip extending into his foot with  occasional breast discomfort.  ABIs on July 17, 2004, showed a right ABI  greater than 1.0, on the left 0.56.  He was brought in on the 9th for  angiogram with plans for admission on the 13th for surgery.   Today Logan [name] underwent a right-to-left fem-fem bypass secondary to his  progressive peripheral vascular disease by his angiogram on July 26, 2004,  without difficulty.  Estimated blood loss was approximately 150 mL.  During  surgery, vital signs remained stable.  In PACU, as patient awoke, his blood  pressure was noted to increase and he became tachycardic with a pulse  approximately in the 110s-120s, patient was asymptomatic.  On interview with  Logan [name], patient was unaware of any palpitations.  He denied any chest  discomfort or shortness of breath.  Preoperatively, patient denied any  nitroglycerin use problems or chest discomfort, palpitations or any cardiac  issues.  We were asked to consult secondary to postoperative tachycardia.   PAST MEDICAL HISTORY:  He is ALLERGIC TO PENICILLIN FOR WHICH HE DEVELOPS  RASH.  SUPPOSEDLY HE HAD A HISTORY OF STATIN INTOLERANCE; HOWEVER, WE CANNOT  FIND ANY DOCUMENTATION  IN OUR OFFICE NOTES OF THIS.  He also has a GI  intolerance to full strength uncoated aspirin.   MEDICATIONS PRIOR TO ADMISSION INCLUDED:  1.  Glucophage 500 mg b.i.d.  2.  Actos 45 mg daily.  3.  Enteric coated aspirin 81 mg daily.  4.  Fish oil.   MEDICAL HISTORY:  Is notable for abnormal Cardiolite in September, 2004  which led to a catheterization on February 23, 2003.  This showed an EF of 35%  and at which time he underwent dug-eluting stenting to the circumflex.  His  last stress __________ was on March 07, 2004, in [place], which showed an EF  of 39%, inferior scar but no ischemia.  His history is also notable for  hyperlipidemia, obesity, back injury, hemorrhoidectomy and right knee  surgery and diabetes type 2.  His peripheral vascular history is extensive  with interventions with a fem-fem, a left fem-pop and an aorta right CFA and  LP grafting in May, 2005.   SOCIAL HISTORY:  He resides in  [place] with his wife and is a retired Curator.  According to the patient, he does continue to smoke and is probably doing so  for at least the last 40+ years.  He also consumes some alcohol but cannot  elaborate at this time.  His exercise and diet is unclear.   FAMILY HISTORY AND EXTENSIVE REVIEW OF SYSTEMS:  Is unobtainable due to his  grogginess postoperatively.   PHYSICAL EXAM:  GENERAL:  A well-nourished, well-developed, pleasant African-  American male in no apparent distress, very groggy but arousable.  Blood  pressure is 160/82, pulse is 98 and regular, respirations 16, sats 99% on 2  liters.  HEENT:  Unremarkable.  NECK:  Supple without thyromegaly, adenopathy, JVD or carotid bruits.  HEART:  Slightly rapid, regular rate and rhythm.  S3 and S4 are not  appreciable.  Do not appreciate any murmurs, rubs, clicks or gallops.  LUNG SOUNDS:  Were clear to auscultation, although somewhat diminished  anterolaterally.  SKIN:  Integument was intact.  He does have some multiple incisions  on his  abdomen and lower extremities.  ABDOMEN:  Obese.  Bowel sounds were not present without organomegaly, masses  or tenderness.  He has bandages over both groins.  EXTREMITIES:  Negative cyanosis, clubbing or edema.  Trace left pedal pulse,  1+ right pedal pulse.  MUSCULOSKELETAL:  Is unremarkable.  NEURO:  Alert and oriented x2.   LABS:  Preoperative chest x-ray on July 25, 2004, shows COPD.  EKG  postoperatively on July 30, 2004, showed sinus tachycardia with a rate of  116, normal axis, normal intervals.  He had an early R-wave and inferior Q-  waves that remained unchanged from May, 2005.  I could not find a  preoperative EKG.  Preop H&H on July 25, 2004, was 15.0 and 43.7, platelets  258, WBCs 8.5, normal indices.  Sodium 136, potassium 4.6, BUN 10,  creatinine 0.8, glucose 185, normal LFTs.  PT 12.1, PTT 28.  Urinalysis  preoperatively was positive for glucose, protein, ketones.   IMPRESSION:  1.  Status post right/left femoral-femoral bypass secondary to progressive      peripheral vascular disease with claudication and documented by      angiogram on July 26, 2004.  2.  Postoperative sinus tachycardia.  3.  Hypertension.  Preoperatively, his blood pressure was 142/81.  Continue      tobacco use.  4.  Hyperglycemia with abnormal urinalysis with proteins, ketone and      glucose.  History as previously.   RECOMMENDATIONS:  Dr. Jens Som reviewed the patient's history, spoke with  and examined the patient.  He felt that his sinus tachycardia was most  likely secondary to hyperadrenergic state associated with surgery.  He  recommends to continue aspirin.  We will add a beta-blocker for his history  of coronary artery disease and decreased LV function.  His b.i.d. dosing  will need to be changed to Toprol long term.  He also may benefit from an  ACE inhibitor given his diabetic history.  Supposedly, he has not tolerated Lipitor and Zocor in the past secondary to arthralgias  and we will start  Pravachol and recommend rechecking his cholesterol and LFTs in approximately  6 weeks and followup with Dr. Myrtis Ser approximately 4 weeks after discharge.  [facility] has ordered an EKG in the morning and we will continue to follow while  he is in the hospital.      EW/MEDQ  D:  07/30/2004  T:  07/30/2004  Job:  [id number]

## 2010-10-05 NOTE — Op Note (Signed)
NAME:  Logan [name], Logan [name] NO.:  [id number]   MEDICAL RECORD NO.:  [id number]          PATIENT TYPE:  INP   LOCATION:  2899                         FACILITY:  [facility]   PHYSICIAN:  Quita Skye. Hart Rochester, M.D.  DATE OF BIRTH:  August 28, 1945   DATE OF PROCEDURE:  07/30/2004  DATE OF DISCHARGE:                                 OPERATIVE REPORT   PREOPERATIVE DIAGNOSIS:  Occlusion of right limb aortobifemoral bypass graft  with severe claudication, right leg.   POSTOPERATIVE DIAGNOSIS:  Occlusion of right limb aortobifemoral bypass  graft with severe claudication, right leg.   OPERATIONS:  Left-to-right femoral-femoral bypass using an 8 mm Hemashield  Dacron graft.   SURGEON:  Quita Skye. Hart Rochester, M.D.   FIRST ASSISTANT:  Pecola Leisure, PA.   ANESTHESIA:  General endotracheal.   PROCEDURE:  Patient was taken to the operating room, placed in the supine  position at which time satisfactory general endotracheal anesthesia was  administered.  Lower abdomen and both groins were prepped with Betadine  scrub and solution and draped in routine sterile manner. Incisions were made  in the previous scars in both inguinal regions, carried down through  subcutaneous tissue and dense scar tissue. There was an aortobifemoral  bypass graft with the right limb occluded and the left limb open. Both sides  were dissected free with a 15 blade, obtaining proximal control of the left  limb of aortobifemoral graft and obtaining control the common femoral,  superficial and profunda femoris arteries bilaterally. Right superficial  femoral artery was patent, the left one was chronically occluded. The  suprapubic tunnel was created and the patient was heparinized. Beginning on  the left side, the femoral vessels were occluded with vascular clamps inflow  occluded proximally and a longitudinal opening made in the Dacron graft  anastomosis of the left common femoral artery. An oval opening was then  created and the 8 mm Hemashield Dacron graft which had been tunneled in the  suprapubic position was spatulated and anastomosed end-to-side with 5-0  Prolene. Following this, on the right side, the previous aortobifemoral  graft was excised from the common femoral artery after occluding the vessels  proximally and distally. The superficial femoral and profunda femoris  arteries were widely patent. The femoral-femoral graft was spatulated and  anastomosed end-to-side with 5-0 Prolene. Clamps then released. There was  excellent pulse in both sides in the femoral arteries. Protamine was given  to reverse the heparin. Following adequate hemostasis, the wound was  irrigated with saline, closed in layers with Vicryl in subcuticular fashion  with Steri-Strips. Sterile dressing applied. The patient had to recovery in  satisfactory condition.     JDL/MEDQ  D:  07/30/2004  T:  07/30/2004  Job:  [id number]

## 2010-10-05 NOTE — Discharge Summary (Signed)
NAME:  DEAVEON, SCHOEN NO.:  [id number]   MEDICAL RECORD NO.:  [id number]                   PATIENT TYPE:  INP   LOCATION:  2007                                 FACILITY:  [facility]   PHYSICIAN:  Quita Skye. Hart Rochester, M.D.               DATE OF BIRTH:  [birth year]   DATE OF ADMISSION:  09/28/2003  DATE OF DISCHARGE:  10/05/2003                                 DISCHARGE SUMMARY   PRIMARY ADMITTING DIAGNOSIS:  Bilateral lower extremity claudication.   ADDITIONAL/DISCHARGE DIAGNOSES:  1. Bilateral lower extremity claudication.  2. Severe aortoiliac occlusive disease.  3. Occluded femoral-to-femoral bypass.  4. Diabetes mellitus, type 2.  5. Hyperlipidemia.  6. Coronary artery disease, status post percutaneous transluminal coronary     angioplasty and stenting in October of 2004.  7. History of a back injury secondary to a fall in 1993.  8. Obesity.   PROCEDURES PERFORMED:  Aorto-to-right-common-femoral and left profunda  femoris bypass using 14 x 8-mm Hemashield Dacron graft.   HISTORY:  The patient is a 65 year old black male with a long history of  peripheral vascular occlusive disease.  He has had several peripheral bypass  procedures including a left femoral-to-posterior-tibial-artery bypass graft  and right-to-left femoral-to-femoral bypass graft.  He has had several  occlusions of his graft and required thrombectomies.  Most recently, in  September 2004, he reported increasing leg pain with walking, worse on the  right than the left.  The pain was so severe that he eventually presented to  the emergency department.  He was referred to Dr. Quita Skye. Hart Rochester for  reevaluation.  At that time, ABIs showed reading of 0.36 on the right and  0.58 on the left.  Based on these findings, Dr. Hart Rochester felt that he had  bilateral iliac artery occlusions and recommended an aortobifemoral bypass  graft.  However, because of the patient's history of coronary artery  disease, a Cardiolite study was recommended to clear him from a cardiac  standpoint preoperatively.  This study ultimately was positive and he  eventually required a PTCA and stenting, followed by a 37-month course of  Plavix.  It was felt that after he completed his 34-month course of Plavix,  that he would be safe to proceed with his aortobifemoral bypass graft  surgery.  On September 15, 2003, he underwent an abdominal aortogram with  bilateral lower extremity runoff and this showed total occlusion of the left  common iliac artery, the left external iliac artery and the left femoral  artery with patent left profunda femoris artery.  Additionally, it showed  chronic occlusion of the left superficial femoral artery with patent  popliteal artery below the knee and 1-vessel runoff to the peroneal artery  and occlusion of the right external iliac artery, with a patent right common  femoral, superficial femoral and profunda femoris artery with good runoff  below the knee on  the right.  Dr. Hart Rochester recommended proceeding with surgery  at this time and the patient agreed.  He was scheduled for elective  admission on Sep 28, 2003.   HOSPITAL COURSE:  He was brought into [facility] on Sep 28, 2003  and was taken to the operating room, where he underwent an open aorto-to-  right-common-femoral-artery and left profunda femoris artery bypass  performed by Dr. Hart Rochester.  He tolerated the procedure well and was  transferred to the SICU in stable condition.  He was able to be extubated  shortly after surgery.  On postop day 1, his feet were warm and well-  performed with palpable dorsalis pedis pulses bilaterally.  He was kept  n.p.o. but his Swan-Ganz catheter and NG tube were removed.  He was  mobilized but kept in the unit for further observation.  By postop day 2, he  was doing well and was able to be transferred to the floor.  He did spike a  fever up to 101.3, which was felt to be secondary to  atelectasis.  He had no  elevation in his white blood cell count, which was 13.9.  On exam, he had no  other signs and symptoms of infection.  He was treated with aggressive  pulmonary toilet measures and incentive spirometry and the fever resolved.  By postop day 3, his bowel function was returning.  He was passing flatus  and was started on sips of liquids.  By postop day 4, he was started on a  clear liquid diet and this was advanced accordingly over the next 48 hours.  At the present time, his bowel function has completely returned.  He is  having normal bowel movements as well as passing flatus.  His abdominal exam  reveal normoactive bowel sounds.  His surgical incisions sites are all  healing well.  His feet are warm and well-perfused with palpable pulses  bilaterally.  He has been ambulating in the halls without difficulty.  He  has remained afebrile and other vital signs have been stable.  He has been  weaned from supplemental oxygen and is maintaining O2 saturations of greater  than 90% on room air.  His labs drawn on Oct 04, 2003 showed a hemoglobin of  11.7, hematocrit 34.5, platelets 249,000, white count 9.9; potassium 3.7,  BUN 8, creatinine 0.9.  He is tolerating a regular diet.  It is felt that if  he continues to remain stable over the next 24 hours, he will hopefully be  ready for discharge home on Oct 05, 2003.   DISCHARGE MEDICATIONS:  1. Pepcid 20 mg b.i.d.  2. Enteric-coated aspirin 325 mg daily.  3. Lantus 35 units nightly.  4. Actos 30 mg daily.  5. Glucophage 1000 mg b.i.d.  6. Zocor 10 mg nightly.  7. Tylox 1-2 q.4 h. p.r.n. for pain.   DISCHARGE INSTRUCTIONS:  He is to refrain from driving, heavy lifting or  strenuous activity.  He may continue ambulating daily and using his  incentive spirometer.  He will continue his same preoperative diet.  He may  shower daily and clean his incisions with soap and water.  DISCHARGE FOLLOWUP:  The [facility] nurse will remove  his staples on Tuesday, Oct 11, 2003, at 9 a.m.  He will then see Dr. Hart Rochester back in the office on  Tuesday, October 25, 2003, at 9:30 a.m., where ankle brachial indices will be  repeated.  Of note, his postoperative ABIs performed in-house showed  right  of 0.99 and left 0.43, which are improved from preop.  He is asked to call  our office if he experiences any problems or has questions.      Coral Ceo, P.A.                        Quita Skye Hart Rochester, M.D.    GC/MEDQ  D:  10/04/2003  T:  10/05/2003  Job:  [id number]   cc:   Willa Rough, M.D.   Kirstie Peri, MD  [address]  [place] [address]  Fax: [phone]

## 2010-10-05 NOTE — Op Note (Signed)
NAME:  AZEKIEL, CREMER NO.:  [id number]   MEDICAL RECORD NO.:  [id number]                   PATIENT TYPE:  OIB   LOCATION:  2899                                 FACILITY:  [facility]   PHYSICIAN:  Quita Skye. Hart Rochester, M.D.               DATE OF BIRTH:  [birth year]   DATE OF PROCEDURE:  09/15/2003  DATE OF DISCHARGE:                                 OPERATIVE REPORT   PREOPERATIVE DIAGNOSES:  Severe claudication in both lower extremities and  some rest pain secondary to severe iliac, femoral, popliteal and tibial  occlusive disease.   PROCEDURE:  Abdominal aortogram with bilateral lower extremity runoff via a  left transbrachial approach.   SURGEON:  Quita Skye. Hart Rochester, M.D.   ANESTHESIA:  Local Xylocaine and Versed 2 mg intravenously.   CONTRAST:  160 ml.   COMPLICATIONS:  None.   DESCRIPTION OF PROCEDURE:  The patient was taken to the [facility]  peripheral endovascular lab, placed in the supine position, at which time  the left arm was prepped with Betadine scrub and solution, draped in a  routine sterile matter.  After infiltration of 1% Xylocaine the left  brachial artery was entered percutaneously in the distal upper arm, guide  wire passed into the aortic arch under direct fluoroscopic guidance and a 5-  French sheath and dilator passed over the guide wire and the dilator  removed.  2000 units of heparin and 200 mcg of nitroglycerin were infused  through the sheath.  A standard pigtail catheter was then advanced into the  suprarenal abdominal aorta and abdominal aortogram was performed, injecting  20 ml of contrast at 20 ml per second.  This revealed the aorta to be widely  patent proximally with single widely patent renal arteries bilaterally.  There was total occlusion of the left common iliac artery with multiple  collaterals filling the left leg.  The right leg had a patent right common  and internal iliac artery but there was severe disease in the  external iliac  artery proximally and total occlusion distally.  The catheter was advanced  into the terminal aorta and bilateral lower extremity runoff performed,  injecting 88 ml of contrast at 8 ml per second.  This revealed the right  external iliac artery to be occluded with reconstitution of the right common  femoral artery with some dilatation in the common femoral artery at the site  of a previous femoral/femoral crossover graft.  The right profunda and  superficial femoral arteries were widely patent and normal in appearance and  there was three vessel runoff which was not totally visualized but was  patent to the ankle at least via the posterior tibial artery.  On the left  side there was total occlusion of the common, external and iliac arteries  and common femoral artery with reconstitution of the left profunda.  The  left superficial femoral  artery was also occluded with reconstitution of the  popliteal artery just above the knee where it was severely diseased,  traversing the knee joint, and it was patent below the knee joint with mild  irregularity and one vessel runoff on the left via the peroneal artery.  Better views of the runoff on the left were obtained using the peak hold  technique, and following completion of this the catheter was removed over  the guide wire, the sheath removed, adequate compression applied.  No  complications ensued.   FINDINGS:  1. Total occlusion of the left common iliac artery, left external iliac     artery and left common femoral artery with patent left profunda femoris     artery.  2. Chronic occlusion of the left superficial femoral artery with patent     popliteal artery below the knee and one     vessel runoff to the peroneal artery.  3. Occlusion of the right external iliac artery with patent right common     femoral, superficial femoral and profunda femoris artery and good runoff     below the knee on the right.                                                Quita Skye Hart Rochester, M.D.    JDL/MEDQ  D:  09/15/2003  T:  09/15/2003  Job:  [id number]

## 2010-10-05 NOTE — Op Note (Signed)
[place]  Patient:    [name], [name] Visit Number: [id number] MRN: [id number]          Service Type: SUR Location: [facility]M 2550 04 Attending Physician:  Carlena Sax Dictated by:   Veverly Fells. Arletha Grippe, M.D. Proc. Date: 02/25/01 Admit Date:  02/25/2001                             Operative Report  PREOPERATIVE DIAGNOSIS:  Neoplasm right parotid gland.  POSTOPERATIVE DIAGNOSIS:  Neoplasm right parotid gland.  PROCEDURE PERFORMED:  Right lateral parotidectomy with facial nerve preservation and facial nerve monitoring via the Nims facial nerve monitor for 2 hours.  SURGEON:  Veverly Fells. Arletha Grippe, M.D.  ASSISTANT:  Kinnie Scales. Annalee Genta, M.D.  ANESTHESIA:  General endotracheal.  INDICATIONS FOR PROCEDURE:  This is a 65 year old black male who gives a history of a slowly enlarging mass involving the inferior aspect of the parotid gland that has been there for the past few months.  It has been nontender, and he was referred to me by one of the oral surgeons in town.  PHYSICAL EXAMINATION:  Did show about a 2 x 3 cm mass involving the tail of the right parotid gland.  This was confirmed with MRI of the neck with and without gadolinium.  Based on his history and physical examination and findings, I have recommended proceeding with the above noted surgical procedure.  I discussed extensively with him and his family the risks and benefits of surgery, including the risks of general anesthesia, infection, and bleeding, facial nerve injury, either temporary or permanent, numbness of face, cylocele, and possible Frye syndrome.  I have entertained any questions, answered them appropriately.  Informed consent has been obtained, and the patient presents for the above noted procedure.  OPERATIVE FINDINGS:  A cystic mass involving the tail of the right parotid gland with two small periparietal lymph nodes sent as separate specimens.  DESCRIPTION OF PROCEDURE:   The patient is brought to the operating room, placed in supine position.  General endotracheal anesthesia administered via the anesthesiologist without complications.  The patient was administered 600 mg of clindamycin without difficulty.  The patients shoulders were placed in a shoulder roll, his head was placed on a doughnut.  The head of the table is turned 90 degrees.  A doubly modified Blair incision was marked out of the patients neck, was injected with a total of 10 cc of 1% lidocaine solution with 1:100,000 epinephrine.  The ______ Nims monitor was placed per protocol, both monitoring the right eye and right mouth musculature, and was set up standard per protocol without difficulty.  The patients right face was sterilely prepped and draped in standard fashion.  The doubly modified Blair incision was then made with a scalpel blade.  Dissection was carried down sharply and through the subcutaneous tissues and through platysma. subplatysmal planes and the plane just above the periparotid fascia was elevated using sharp dissection to create a skin flap in a facelip manner. The tail of the parotid gland was then sharply and bluntly dissected off of the anterior portion of the SEM muscle down to digastric, and this was traced up to the skull base.  Periparotid fascia then between connecting parotid gland to the tragal cartilage was then excised both bluntly and sharply. Dissection was carried down between the cartilage and the mastoid tip, and the parotid gland until the temporal squamous suture was identified  just inferior to the tragal pointer.  Blunt dissection of this area was used to identify the main branch of the facial nerve.  It was actually splitting around a small artery which was ligated and tied with 3-0 silk stitch.  It was stimulated on a 0.3 setting with good stimulation of all branches.  The inferior aspect of the nerve was then dissected out into the gland, and since the  mass was very inferiorly located, this main inferior branch was then dissected out into the gland and the parotid tissue overlying the inferior branch was then divided without injury to the nerve.  The nerve was always kept in good visualization, and was protected at all times throughout the case.  Once the inferior division of the nerve was traced out into the gland, blunt and sharp dissection was used to remove the inferior aspect of the gland, not to injure the facial nerve itself.  This aspect of the gland did contain the cystic mass which was then removed and sent to surgical pathology for permanent section analysis.  Two periparietal lymph nodes were also identified within the substance of the gland, and were somewhat enlarged, about maybe 1.5 cm in dimension.  These were dissected out sharply and sent to surgical pathology for lymphoma study.  This was done in such a fashion as to not injure the facial nerve.  At the end of this procedure, bleeding was controlled with bipolar cautery.  The wound was then irrigated with copious amounts of irrigation, fluid suctioned dry.  There was no evidence of any active bleeding.  The facial nerve was once again stimulated with the probe centered at 0.3 setting with good facial movement of all branches on the right side.  A separate stab incision was made posteriorly in the inferior skin flap with scalpel blade.  A 10 mm fully fluted flat Blake drain was placed through this incision, was trimmed, and sutured to the neck with a 2-0 silk stitch. Subcutaneous tissues were reapproximated with interrupted 4-0 Vicryl suture, and final skin closure was achieved with a running 5-0 nylon stitch.  Fluids given during procedure were approximately 1 L of crystalloids, and blood loss was less than 50 cc.  Urine output was not measured.  The above noted closed suction drain was placed.  Her nose packed.  Specimens sent were two periparietal lymph nodes and  right lateral parotid tissue containing tumor. The patient tolerated the procedure well without complications.  Was extubated  in the operating room and transferred to the recovery room in stable condition.  Sponge, needle, and instrument counts were correct at the end of the procedure.  Total duration of procedure was approximately 2-1/2 hours. The patient will be admitted for overnight recovery.  If he recovers well and the drain output is minimal, we will remove the drain and send him home with a pressure bandage.  He will also be sent home on some Levaquin 500 mg p.o. q.d. x 14 days and Vicodin #30 with two refills one or two tabs p.o. q.4h. p.r.n. pain.  He is to remove the dressing on postoperative day #1, keep the wound dry for two more days, and then may put antibiotic ointment on it t.i.d.  Both him and his family were given oral and written instructions.  They are to call with any problems with bleeding, fever, or vomiting, pain, or extra medications, or any other questions.  He will follow up for suture removal in one week after discharge.Dictated by:  Veverly Fells. Arletha Grippe, M.D. Attending Physician:  Carlena Sax DD:  02/25/01 TD:  02/25/01 Job: [id number]

## 2010-10-05 NOTE — Discharge Summary (Signed)
NAME:  Logan [name], Logan [name] NO.:  [id number]   MEDICAL RECORD NO.:  [id number]                   PATIENT TYPE:  OIB   LOCATION:  6531                                 FACILITY:  [facility]   PHYSICIAN:  Vida Roller, M.D.                DATE OF BIRTH:  October 29, 1945   DATE OF ADMISSION:  02/22/2003  DATE OF DISCHARGE:  02/24/2003                           DISCHARGE SUMMARY - REFERRING   DISCHARGE DIAGNOSES:  1. Preoperative clearance with abnormal Cardiolite study.  2. Severe single-vessel coronary artery disease on left heart     catheterization February 22, 2003.  3. Status post stent to the proximal circumflex lesion (99% to 0) with     residual disease in the proximal left anterior descending artery (40%),     ejection fraction 35%, no mitral regurgitation.   SECONDARY DIAGNOSES:  1. Severe aortoiliac occlusive disease.  2. History of claudication with current need for surgery to the right lower     extremity, ankle/brachial indices taken February 16, 2003 on the right     0.36, on the left 0.58.  3. 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 Hart Rochester.  4. Type 2 diabetes mellitus.  5. Status post right knee surgery.   PROCEDURE:  1. February 22, 2003, left heart catheterization by way of the right radial     artery axis. The study shows severe single-vessel coronary artery disease     with a 99% lesion in the left circumflex after the first obtuse marginal.     There is residual 40% disease in the proximal LAD. No mitral     regurgitation. Ejection fraction diminished at 35% with inferior     akinesis. Dr. Dorethea Clan is the interventional cardiologist, and plan for     percutaneous coronary intervention discussed and scheduled for October 6.  2. February 23, 2003:  PCI of the left circumflex lesion through the left     brachial artery approach. A 2.75 x 20 Taxus drug-eluding stent was     placed,  reducing the stenosis from 95 to 0%, no dissection and no post     procedural sequelae. Dr. Riley Kill is the interventional cardiologist.   DISCHARGE DISPOSITION:  Mr. Bronte Kropf is ready for discharge post PCI which  was done October 6. He is ready for discharge October 7. He has good  perfusion to both upper extremities. There is no swelling or occlusion where  the interventions were done on either upper extremity. He is alert and  oriented x3. He does have mild temperature elevation of 100 at the time of  examination. His blood pressures the last 24 hours are running from 120 to  130 to 150, and in the setting of increased ejection fraction, Mr. Gremillion  will certainly tolerate beta blocker therapy. Mr. Mangas has had no chest  pain, no shortness of breath during this hospitalization, and he has  maintained a sinus rhythm throughout this hospitalization.   DISCHARGE MEDICATIONS:  He will go home on the following medications:  1. Enteric-coated aspirin 325 mg daily.  2. Plavix 75 mg daily for six months.  3. Amaryl 8 mg daily.  4. Actos 30 mg daily.  5. On hold currently for 48 hours, Glucophage 1,000 mg twice daily. He may     restart this Sunday, October 10.  6. Percocet 5/325 one to two tablets as needed for pain.  7. Beta blocker. I will confer with Dr. Myrtis Ser as to which beta blocker he     would chose, either Toprol or Coreg.   BRIEF HISTORY:  Mr. Lasch is 65 years old. He has known significant vascular  disease. He has had surgery by Dr. Quita Skye. Hart Rochester in the past on the left  lower extremity for left superficial femoral artery occlusion. He is now  having ongoing symptoms to the right leg and tentative surgery is scheduled  for October 11. A Cardiolite study was ordered by his primary care giver,  Dr. Sherryll Burger. Dr. Diona Browner read this on September 29. It was adenosine  Cardiolite. There was significant abnormalities. The patient had ejection  fraction about 34%. There was apical  akinesis and inferior hypokinesis.  There was a fixed inferior defect, and there was an apical defect which  showed some reversibility. There was some question of ischemia. There is no  prior nuclear scan for comparison, and the patient will be admitted to [facility] [place] for a left heart catheterization. In addition,  electrocardiogram shows some small inferior Q waves.   HOSPITAL COURSE:  Mr. Vanatta was admitted to [facility] for further  evaluation of possible coronary artery disease to include left heart  catheterization. This was done on October 5 with finding of a focal lesion  in the left circumflex of 99% and a proximal lesion in the LAD of 40%. His  ejection fraction was reduced at a level of 35%. No mitral regurgitation.  The patient tolerated the catheterization study very well. His case was  reviewed with Dr. Hart Rochester, his vascular surgeon, and Dr. Donata Clay, cardiac  surgeon, as well as his cardiologist, Dr. Myrtis Ser and Dr. Dorethea Clan. Plan is for  left brachial approach for PCI of the left circumflex. This was done October  6 without complications. The occlusion of 95% was reduced to 0% with no  dissection. The patient tolerated the procedure well. He has been chest pain  free both before and after the procedure, and he was scheduled for discharge  post procedure day #1 with no post procedural complications to the sites of  entrance for intervention. Mr. Mccaughan goes home with medications and followup  as dictated above.   LABORATORY DATA:  His laboratories post procedure October 7, CBC:  White  cells 6.1, hemoglobin 12.5, hematocrit 36.2, platelets 268. Serum  electrolytes October 7:  Sodium 137, potassium 3.8, chloride 102,  bicarbonate 26, BUN 12, creatinine 0.9, glucose 230.   DISCHARGE INSTRUCTIONS:  The plan is for Mr. Jeshawn Melucci to continue Plavix  and aspirin for a six month period as prescribed. He is also to followup with his vascular surgeon, Dr. Hart Rochester. He  will have a two week followup with  Dr. Nona Dell in the [facility] office of [facility] cardiology. This will be  scheduled, and he will also be started on a beta blocker. Another element  of  the plan would be to assess fasting lipid profile. This can be done as an  outpatient, and in addition, we would seriously consider placing Mr. Jeancarlo Leffler on a statin. This will be done in conjunction with Dr. Willa Rough. I  will no doubt dictate an addendum to this discharge with that in mind.      Maple Mirza, P.A.                    Vida Roller, M.D.    GM/MEDQ  D:  02/24/2003  T:  02/24/2003  Job:  [id number]   cc:   Sherryll Burger, M.D.  [place], [place]   Willa Rough, M.D.   Quita Skye Hart Rochester, M.D.  [address]  [address]  [place] [address]

## 2010-10-05 NOTE — Assessment & Plan Note (Signed)
[facility]                          [facility] NOTE   Logan, [name]                         MRN:          [id number]  DATE:08/21/2006                            DOB:          21-Mar-1946    PRIMARY CARDIOLOGIST:  Logan Abed, MD, [facility]   REASON FOR VISIT:  Scheduled follow up.  Please refer to Dr. Myrtis Ser'  clinic note of September 2007 for full details.   Since last seen in the clinic, the patient reports development of  significant exertional dyspnea with no associated chest discomfort.  He  denies any dyspnea at rest, but has had occasional PND, has had to sleep  on more than one pillow, but has not had any exacerbation of some  chronic mild lower extremity edema.  The patient has known severe single  vessel coronary artery disease treated with TAXUS stenting of a high-  grade circumflex lesion in 2004.  He has not had any substernal  catheterizations, but did have an exercise stress Cardiolite one year  later, which was negative for any definite ischemia; EF of 39%.  Ejection fraction at the time of catheterization was approximately 35%.   The patient also has severe peripheral vascular disease and has  undergone prior bilateral femoral bypass grafting by Dr. Hart Rochester.  He has  had significant improvement of his lower extremity claudication since  undergoing surgery.   Electrocardiogram today reveals normal sinus rhythm at 73 beats per  minute with normal axis and no ischemic changes.   MEDICATIONS:  1. Metformin 1000 b.i.d.  2. Actos 45 daily.  3. Aspirin 81 daily.  4. Fish oil 1-2 tablets daily.  5. Metoprolol 25 b.i.d.  6. Lantus 45 units daily.  7. Zetia 10 daily.   PHYSICAL EXAMINATION:  VITAL SIGNS:  Blood pressure 139/87, pulse 69 and  regular, weight 247 (up 27 pounds).  GENERAL:  A 65 year old male sitting upright, in no distress.  HEENT:  Normocephalic and atraumatic.  NECK:  Palpable bilateral carotid pulses  without bruits.  No JVD.  LUNGS:  Clear to auscultation in all fields.  HEART:  Regular rate and rhythm (S1 and S2).  No significant murmurs.  ABDOMEN:  Protuberant, nontender.  EXTREMITIES:  Palpable distal pulses with 1+ bilateral non-pitting  edema.  NEUROLOGIC:  Flat affect, but no focal deficits.   REVIEW OF SYSTEMS:  No longer smokes cigarettes, but does have an  occasional cigar.  Reports compliance with medications and refraining  from added salt.  The remaining systems negative.   IMPRESSION:  1. Exertional dyspnea.      a.     Worrisome for anginal equivalent.  2. Severe single vessel coronary artery disease.      a.     Status post TAXUS stenting, high-grade circumflex artery       stenosis October 2004.      b.     Residual 100% distal circumflex stenosis, 50% ostial LAD.      c.     Ejection fraction approximately 35% by catheterization.      d.  Low risk exercise stress Cardiolite; EF 39%, October 2005.  3. Severe peripheral vascular disease.      a.     Status post left/right femoral-femoral bypass graft, March       2006, secondary to 100% occlusion, right limb aortobifemoral       bypass graft.      b.     Status post multiple prior left lower extremity bypass       grafts; right/left femoral-femoral bypass graft in 1997;       aortobifemoral bypass graft in 2005.  4. Type 2 diabetes mellitus.  5. Hyperlipidemia.  6. Obesity.  7. Tobacco.   PLAN:  1. Schedule exercise stress Cardiolite to exclude occult ischemia.  2. Scheduled 2-D echocardiogram for reassessment of left ventricular      function.  3. Chest fasting lipid profile and BNP level.  4. Consider addition of an ACE inhibitor to current medication      regimen, particularly if patient continues to have depressed left      ventricular function.  5. Schedule clinic follow up with myself and Dr. Willa Rough in 2      weeks for review of study results and further recommendations      including  consideration of repeat cardiac catheterization.  The      patient is agreeable with this plan.     Rozell Searing, PA-C  Electronically Signed      Logan Abed, MD, [facility]  Electronically Signed   GS/MedQ  DD: 08/21/2006  DT: 08/21/2006  Job #: [id number]   cc:   Kirstie Peri, MD

## 2010-10-05 NOTE — Assessment & Plan Note (Signed)
[facility]                            [facility] NOTE   LYDON, VANSICKLE                         MRN:          [id number]  DATE:01/23/2006                            DOB:          March 02, 1946    Mr. Dykema is relatively well.  He feels like he has excess sleeping at home.  He is not having any chest pain or shortness of breath.  His sugar has been  higher than before and he has been put back on Lantus by Dr. Sherryll Burger.  The  patient is on a beta blocker.  He is not on an ACE inhibitor.   He has coronary disease that is stable and his lipids are abnormal.   PAST MEDICAL HISTORY:   ALLERGIES:  PENICILLIN.   MEDICATIONS:  Metformin, Actos, aspirin, fish oil, metoprolol 25 b.i.d.   OTHER MEDICAL PROBLEMS:  See the list below.   REVIEW OF SYSTEMS:  His only complaint today is excess sleeping.   PHYSICAL EXAMINATION:  VITAL SIGNS:  Blood pressure is 120/72 with a pulse  of 80.  The patient is oriented to person, time and place and his affect is normal.  LUNGS:  Clear.  Respiratory effort is not labored.  HEENT:  Reveals no xanthelasma.  He has normal extraocular motion.  NECK:  There are no carotid bruits.  There is no jugular venous distention.  CARDIAC EXAM:  Reveals an S1 and S2.  There are no clicks or significant  murmurs.  ABDOMEN:  His abdomen is soft.  He has no significant peripheral edema.  There are no masses or bruits in his abdomen.   LABORATORY DATA:  No labs are done today.  I do have copies of labs from Dr.  Sherryll Burger.  The glucose was 272 and Lantus has been restarted.  HDL is low at 32.  Optimal treatment would include Niaspan, but the patient is not inclined.  LDL is calculated at 72.  There is not a direct LDL.  Triglycerides are 326.   PROBLEMS INCLUDE:  1. Coronary disease, post high-grade circumflex stenosis, treated with      drug eluting stent in the past.  2. Diabetes.  3. Peripheral vascular disease.  4. Lipid  abnormalities.  I would hope that Dr. Sherryll Burger can talk him into      taking an additional medication.  It may be worth considering using      fenofibrate for his triglycerides, although probably these will come      under better control with his diabetes controlled.  5. History of nausea from penicillin.  6. History of right-knee surgery.  7. Status post aorta to right common femoral and left profunda femoral      bypass graft in 2005 and status post fem-fem surgery.  8. Ejection fraction 40%.  I have tried, over time, to adjust his      medicines further.  I am not able to make any further changes at this      time.   Treatment of his glucose and his lipids will be of primary  concern.                                   Luis Abed, MD, [facility]   JDK/MedQ  DD:  01/23/2006  DT:  01/23/2006  Job #:  [id number]   cc:   Kirstie Peri, MD

## 2010-10-05 NOTE — Discharge Summary (Signed)
NAME:  Logan [name], Logan [name] NO.:  [id number]   MEDICAL RECORD NO.:  [id number]          PATIENT TYPE:  INP   LOCATION:  2024                         FACILITY:  [facility]   PHYSICIAN:  Quita Skye. Hart Rochester, M.D.  DATE OF BIRTH:  1946-04-06   DATE OF ADMISSION:  07/30/2004  DATE OF DISCHARGE:                                 DISCHARGE SUMMARY   PRIMARY DIAGNOSIS:  Occlusion of right limb aortobifemoral bypass graft with  severe claudication, right leg.   SECONDARY DIAGNOSES:  1.  Diabetes mellitus.  2.  Hyperlipidemia.  3.  Obesity.  4.  Coronary artery disease.  The patient is status post percutaneous      transluminal coronary angiography and stenting in October of 2004.  5.  History of peripheral vascular disease for which the patient has      undergone several left lower extremity bypass grafts by Dr. Nedra Hai in 1995      with left femoral to tibial graft and a left popliteal to posterior      tibial vein graft with thrombectomy of the same in 1997.  A new right to      left femorofemoral bypass graft in 1997.  Also aortobifemoral bypass      graft by Dr. Hart Rochester in 2005.   ALLERGIES:  The patient states he is allergic to PENICILLIN.  The patient  has had a bleeding peptic ulcer on aspirin, but has no problems with enteric-  coated aspirin.   IN-HOSPITAL OPERATIONS AND PROCEDURES:  Left to right femorofemoral bypass  using an 8 mm Hemashield Dacron graft.   HISTORY OF PRESENT ILLNESS:  Logan [name] is a 65 year old, African American,  male patient, who underwent aortobifemoral bypass grafting by Dr. Hart Rochester in  May of 2005 for severe aortoiliac occlusive disease and bilateral  claudication symptoms.  His bypass extended to the right common femoral on  the left profundus femoris artery.  He had a chronically occluded lower  extremity graft done to the tibial vessels on the left performed by Dr. Nedra Hai  many years ago and this has been stable.  He had excellent resolution of his  symptoms for six months, but two months ago developed recurrent symptoms in  the right leg only beginning in the buttocks and extending into the calf and  foot after walking less than a block with numbness and tingling in the foot  and occasional rest pain.  He has had no nonhealing ulcers.  The patient was  seen in the office and had ABIs showing 0.43 on the right compared to 1.0 in  October of 2005 and 0.59 on the left.  For details of the patient's past  medical history and physical exam, please see the dictated history and  physical.   HOSPITAL COURSE:  On July 30, 2004, Logan [name] was admitted to [facility] where he underwent left to right femoral to femoral bypass using an  8 mm Hemashield Dacron graft.  The patient tolerated the procedure well and  was transferred to the ICU in stable condition.  The patient's  postoperative  course was pretty much unremarkable on postoperative day #1, although he did  develop sinus tachycardia on the day of surgery.  Cardiology was consulted  to manage.  They did start the patient on Lopressor 25 mg twice a day.  Also, the patient was started on Pravachol 40 mg at night.  As noted, the  patient did not tolerate Lipitor or Zocor in the past secondary to  arthralgias, but was willing to try Pravachol.  For the remainder of the  hospital course, he would develop a heart rate in the 90s and then jump up  to about 115.  He will be continued on the Lopressor at discharge.  Postoperatively the patient's fem-fem graft was intact and functioning well.  He had 2+ graft pulses, as well as 2+ bilateral PT pulses postoperatively  and at discharge.  He did develop a temperature of 101.5 degrees on August 01, 2004.  This was considered to be due to atelectasis.  The patient was  encouraged to continue doing his breathing treatments.  A urinalysis was  sent to evaluate and a CBC was ordered for a.m.  Prior white blood cell  counts had been normal.  His  temperature did come prior to discharge home.  The white count was also in normal limits at discharge.  The patient was out  of bed and ambulating well.  He was off of the oxygen with room air  saturations of 96%.  Appetite was improving.  While in the hospital, the  patient received smoking cessation education.  The patient stated that he  quit about a week ago and he was encouraged to continue with smoking  cessation.  The patient states that he does want to quit.  The patient was  discharged to home on postoperative day #3 in stable condition.  The  urinalysis was negative.  A followup appointment will be made with Dr.  Hart Rochester in three weeks.  The patient is to follow up with Dr. Myrtis Ser in four  weeks.  Logan [name] received instructions on diet, activity level and  incisional care.  He was told no driving until released to do so and no  heavy lifting over 10 pounds.  He was also remained of smoking cessation, a  heart healthy diet and to wash his incision with soap and water.  The  patient acknowledges understanding.  He was told to contact the office if he  develops any draining or opening of the incision.  The patient acknowledged  understanding.  Logan [name] was encouraged to ambulate about three to four  time per day, progress as tolerate and to continue his breathing treatments.   DISCHARGE MEDICATIONS:  1.  Metformin 500 mg p.o. b.i.d.  2.  Actos 45 mg p.o. daily.  3.  Enteric-coated aspirin 81 mg p.o. daily.  4.  Fish oil one to two daily.  5.  Lopressor 25 mg p.o. b.i.d.  6.  Pravastatin 20 mg p.o. q.h.s.  7.  Tylox one tablet every four hours as needed for pain.      KMD/MEDQ  D:  08/01/2004  T:  08/01/2004  Job:  [id number]

## 2010-10-05 NOTE — Op Note (Signed)
NAME:  Logan [name], Logan [name] NO.:  [id number]   MEDICAL RECORD NO.:  [id number]          PATIENT TYPE:  OIB   LOCATION:  2899                         FACILITY:  [facility]   PHYSICIAN:  Quita Skye. Hart Rochester, M.D.  DATE OF BIRTH:  12-23-1945   DATE OF PROCEDURE:  07/26/2004  DATE OF DISCHARGE:                                 OPERATIVE REPORT   PREOPERATIVE DIAGNOSIS:  Severe claudication, right leg secondary to  occlusion right limb aortobifemoral bypass graft.   POSTOPERATIVE DIAGNOSIS:  Severe claudication, right leg secondary to  occlusion right limb aortobifemoral bypass   PROCEDURE:  Abdominal aortogram with bilateral lower extremity runoff via  the left common femoral approach.   SURGEON:  Dr. Hart Rochester.   ANESTHESIA:  1.  Local Xylocaine.  2.  Versed 2 mg intravenously.  3.  Fentanyl 50 mcg intravenously.   CONTRAST:  185 mL.   COMPLICATIONS:  None.   DESCRIPTION OF PROCEDURE:  The patient was taken to the [facility]  Peripheral Endovascular Lab, placed in a supine position at which time both  groins were prepped with Betadine scrub and solution, draped in routine  sterile manner.  After infiltration of 1% Xylocaine, the left common femoral  artery was entered percutaneously and using an Amplatz super-stiff wire,  guidewire was passed into the suprarenal aorta through the left limb of the  aortobifemoral bypass graft.  A 5-French sheath and dilator were passed up  over guidewire, dilator removed, and a standard pigtail catheter positioned  in the suprarenal aorta.  A flush abdominal aortogram was performed,  injecting 20 mL of contrast at 20 mL per second.  This revealed the aorta to  be widely patent down to the renal arteries with single renal arteries  bilaterally with no stenoses noted.  There was an infrarenal aortic graft  anastomosed, and the left limb was widely patent.  The right limb was  totally occluded at its origin.  The catheter was withdrawn into  the  terminal aorta and bilateral lower extremity runoff performed, injecting 88  mL of contrast at 8 mL per second.  This revealed the left common femoral  and profunda femoris arteries to be widely patent.  The left superficial  femoral artery was totally occluded with reconstitution of a diseased  popliteal artery just above the knee joint and diseased tibioperoneal trunk  with one-vessel runoff to the peroneal artery on the left.  On the right  side, the right limb of the aortobifemoral graft was occluded with  reconstitution of the common femoral artery at the level of the inguinal  ligament with a patent profunda femoris and a right superficial femoral  artery widely patent throughout the leg and two-vessel runoff on the right  through the peroneal and posterior tibial arteries but were not well-  visualized because of dilution of the contrast and the inflow occlusion.  Additional views of the right common femoral artery were obtained using the  peak hold technique, and also additional views of the left popliteal artery  and proximal runoff were also obtained.  Having tolerated  procedure well,  the sheath was removed, adequate compression applied.  No complications  ensued.   FINDINGS:  1.  Widely patent aorta to left common femoral artery with occlusion of      right limb of aortobifemoral graft.  2.  Widely patent single renal arteries bilaterally.  3.  Left superficial femoral artery occlusion with reconstitution diseased      popliteal artery above the knee joint, one-vessel runoff on the left      preperoneal artery.  4.  Widely patent right superficial femoral artery, profunda femoris artery,      popliteal artery, and two-vessel runoff on the right via posterior      tibial and peroneal arteries.      JDL/MEDQ  D:  07/26/2004  T:  07/26/2004  Job:  [id number]

## 2010-10-05 NOTE — Cardiovascular Report (Signed)
NAME:  Logan [name], Logan [name]                            ACCOUNT NO.:  [id number]   MEDICAL RECORD NO.:  [id number]                   PATIENT TYPE:  OIB   LOCATION:  6531                                 FACILITY:  [facility]   PHYSICIAN:  Arturo Morton. Riley Kill, M.D.             DATE OF BIRTH:  10-10-1945   DATE OF PROCEDURE:  02/23/2003  DATE OF DISCHARGE:                              CARDIAC CATHETERIZATION   PROCEDURES PERFORMED:  Percutaneous stenting of the proximal circumflex  coronary artery.   CARDIOLOGIST:  Arturo Morton. Riley Kill, M.D.   INDICATIONS:  Logan [name] is a 65 year old gentleman who has severe peripheral  vascular disease.  He has had a previous fem-fem bypass and then  subsequently probably has no distal circulation.  The current study was done  following catheterization, which was done because of an abnormal Cardiolite.  The catheterization demonstrated a total distal circumflex or AV circumflex  with a high-grade stenosis in the proximal circumflex leading into a large  marginal branch.  There was a 40-50% ostial LAD.  In addition, there was a  nondominant right, which collateralized part of the circumflex.   We had a thorough and extensive discussion abut the various strategies.  One  strategy was a non-drug-eluting stent followed by peripheral vascular  surgery.  However, the patient is diabetic.  His risks of a need for target  vessel revascularization are substantially increased because of his  diabetes.  Because this is an important vessel it was felt that a drug-  eluting platform would be optimal for long-term cardiac care.   After discussion with the family and our discussions with both Dr. Hart Rochester, I  also reviewed the case with Dr. Kathlee Nations Trigt as the patient had evidence  of some LV dysfunction, and we had limited access with regards to intra-  aortic balloon pump should a complication become necessary.  The family was  aware of all this.  They, and we, elected to  proceed.  Risks, benefits and  alternatives were discussed with the patient and his family in great detail.   DESCRIPTION OF THE PROCEDURE:  The patient was brought to the cath lab and  prepped and draped in the usual fashion.  Preprocedural Plavix had been  administered.  The left arm was prepped and draped.  Using a front wall  needle the left brachial artery was entered just above the crease.  A short  brachial insertion sheath was then placed without difficulty.  Following  this we ended up using a JL-3 guiding catheter, which sat nicely in the left  ostium.  A BMW wire was then passed down the artery after Angiomax had been  administered and an appropriate ACT had been documented.  The lesion was  then directly stented using a 2.75 x 20 TAXUS [employer] drug-eluting  stent.  This was taken up to 15 atmospheres.  Post dilatation was then  achieved using a 3.25 short PowerSail balloon, which was dilated throughout  the distribution of the stent.  There was marked improvement in the  appearance of the artery.   The patient experienced no ischemic complications in the arm and there was  no difficulty with the course of the procedure.   All catheters were subsequently removed.  The brachial sheath had previously  been sewn into place.   The patient was taken to the holding area in satisfactory clinical  condition.   ANGIOGRAPHIC DATA:  The left main coronary artery is free of critical  disease.   The left anterior descending artery has an approximate 50% ostial stenosis  with a minimal luminal diameter in the range of 2 mm, perhaps slightly more.   The proximal circumflex provides for a small marginal branch and a small  atrial circumflex branch.  Following this there is subtotally occluded tiny  marginal branch, which comes off the superior aspect of a 95% stenosis.  This leads into a large marginal branch.  The AV circumflex could not be  visualized and actually fills in  some respects retrograde by filling from  the nondominant right.  The marginal itself has at least four sub-branches  and is a rather dominant circumflex vessel supplying a significant portion  of the lateral myocardium.  Following stenting there was no residual  stenosis.  The edges appeared to be clean and smooth, and there was no  evidence of procedural or edge dissection that was visible angiographically.   Successful percutaneous stenting of the circumflex coronary artery using a  direct stent technique with postprocedural deployment dilatation with  reduction in the stenosis from 95 to 0%.   CONCLUSIONS:  Successful percutaneous stenting of the mid circumflex  coronary artery as described in the above-text.   DISPOSITION:  The patient will need a minimum of six months of aspirin and  Plavix.  He will be followed closely by Dr. Myrtis Ser and Dr. Hart Rochester.  Hopefully,  his peripheral vascular surgery can wait.  I would not be inclined to  discontinue his Plavix for any particular reason and would be more inclined  to accept the risks of bleeding during surgery and this became necessary  early on.                                                 Arturo Morton. Riley Kill, M.D.    TDS/MEDQ  D:  02/23/2003  T:  02/23/2003  Job:  [id number]   cc:   Cardiovascular Laboratory   Willa Rough, M.D.   Kirstie Peri, MD  [address]  [place] [address]  Fax: [phone]   Quita Skye. Hart Rochester, M.D.  [address]  [place]  [place] [address]   Vida Roller, M.D.  Fax: [phone]

## 2011-11-28 ENCOUNTER — Encounter: Payer: Self-pay | Admitting: Cardiology

## 2011-12-20 ENCOUNTER — Ambulatory Visit (INDEPENDENT_AMBULATORY_CARE_PROVIDER_SITE_OTHER): Payer: Medicare Other | Admitting: Vascular Surgery

## 2011-12-20 ENCOUNTER — Inpatient Hospital Stay (HOSPITAL_COMMUNITY): Payer: Medicare Other

## 2011-12-20 ENCOUNTER — Encounter (HOSPITAL_COMMUNITY): Payer: Self-pay | Admitting: *Deleted

## 2011-12-20 ENCOUNTER — Other Ambulatory Visit: Payer: Self-pay | Admitting: *Deleted

## 2011-12-20 ENCOUNTER — Encounter (HOSPITAL_COMMUNITY)
Admission: RE | Disposition: A | Discharge status: Skilled Nursing Facility | Payer: Self-pay | Source: Ambulatory Visit | Attending: Vascular Surgery

## 2011-12-20 ENCOUNTER — Inpatient Hospital Stay (HOSPITAL_COMMUNITY): Payer: Medicare Other | Admitting: *Deleted

## 2011-12-20 ENCOUNTER — Ambulatory Visit: Payer: Medicare Other | Admitting: Vascular Surgery

## 2011-12-20 ENCOUNTER — Encounter: Payer: Self-pay | Admitting: Vascular Surgery

## 2011-12-20 ENCOUNTER — Inpatient Hospital Stay (HOSPITAL_COMMUNITY)
Admission: RE | Admit: 2011-12-20 | Discharge: 2011-12-26 | DRG: 253 | Disposition: A | Discharge status: Skilled Nursing Facility | Payer: Medicare Other | Source: Ambulatory Visit | Attending: Vascular Surgery | Admitting: Vascular Surgery

## 2011-12-20 ENCOUNTER — Other Ambulatory Visit: Payer: Self-pay

## 2011-12-20 VITALS — BP 137/59 | HR 88 | Temp 98.5°F | Ht 69.0 in | Wt 221.0 lb

## 2011-12-20 DIAGNOSIS — I739 Peripheral vascular disease, unspecified: Secondary | ICD-10-CM

## 2011-12-20 DIAGNOSIS — I998 Other disorder of circulatory system: Secondary | ICD-10-CM

## 2011-12-20 DIAGNOSIS — I743 Embolism and thrombosis of arteries of the lower extremities: Secondary | ICD-10-CM

## 2011-12-20 DIAGNOSIS — I428 Other cardiomyopathies: Secondary | ICD-10-CM | POA: Diagnosis present

## 2011-12-20 DIAGNOSIS — I70219 Atherosclerosis of native arteries of extremities with intermittent claudication, unspecified extremity: Secondary | ICD-10-CM | POA: Insufficient documentation

## 2011-12-20 DIAGNOSIS — I999 Unspecified disorder of circulatory system: Secondary | ICD-10-CM | POA: Insufficient documentation

## 2011-12-20 DIAGNOSIS — Z6832 Body mass index (BMI) 32.0-32.9, adult: Secondary | ICD-10-CM

## 2011-12-20 DIAGNOSIS — Z7982 Long term (current) use of aspirin: Secondary | ICD-10-CM

## 2011-12-20 DIAGNOSIS — I70409 Unspecified atherosclerosis of autologous vein bypass graft(s) of the extremities, unspecified extremity: Principal | ICD-10-CM | POA: Diagnosis present

## 2011-12-20 DIAGNOSIS — Z48812 Encounter for surgical aftercare following surgery on the circulatory system: Secondary | ICD-10-CM

## 2011-12-20 DIAGNOSIS — T82898A Other specified complication of vascular prosthetic devices, implants and grafts, initial encounter: Secondary | ICD-10-CM

## 2011-12-20 DIAGNOSIS — F17200 Nicotine dependence, unspecified, uncomplicated: Secondary | ICD-10-CM | POA: Diagnosis present

## 2011-12-20 DIAGNOSIS — I2510 Atherosclerotic heart disease of native coronary artery without angina pectoris: Secondary | ICD-10-CM | POA: Diagnosis present

## 2011-12-20 DIAGNOSIS — D62 Acute posthemorrhagic anemia: Secondary | ICD-10-CM | POA: Diagnosis not present

## 2011-12-20 DIAGNOSIS — E119 Type 2 diabetes mellitus without complications: Secondary | ICD-10-CM | POA: Diagnosis present

## 2011-12-20 DIAGNOSIS — M79609 Pain in unspecified limb: Secondary | ICD-10-CM

## 2011-12-20 DIAGNOSIS — E785 Hyperlipidemia, unspecified: Secondary | ICD-10-CM | POA: Diagnosis present

## 2011-12-20 DIAGNOSIS — E663 Overweight: Secondary | ICD-10-CM | POA: Diagnosis present

## 2011-12-20 HISTORY — DX: Essential (primary) hypertension: I10

## 2011-12-20 HISTORY — PX: LOWER EXTREMITY ANGIOGRAM: SHX5508

## 2011-12-20 HISTORY — PX: AXILLARY-FEMORAL BYPASS GRAFT: SHX894

## 2011-12-20 HISTORY — PX: FASCIOTOMY: SHX132

## 2011-12-20 HISTORY — DX: Shortness of breath: R06.02

## 2011-12-20 HISTORY — DX: Angina pectoris, unspecified: I20.9

## 2011-12-20 LAB — COMPREHENSIVE METABOLIC PANEL
ALT: 16 U/L (ref 0–53)
Alkaline Phosphatase: 92 U/L (ref 39–117)
BUN: 22 mg/dL (ref 6–23)
Chloride: 100 mEq/L (ref 96–112)
GFR calc Af Amer: 71 mL/min — ABNORMAL LOW (ref >90)
Glucose, Bld: 199 mg/dL — ABNORMAL HIGH (ref 70–99)
Potassium: 4.3 mEq/L (ref 3.5–5.1)
Sodium: 136 mEq/L (ref 135–145)
Total Bilirubin: 0.4 mg/dL (ref 0.3–1.2)
Total Protein: 7.7 g/dL (ref 6.0–8.3)

## 2011-12-20 LAB — URINALYSIS, ROUTINE W REFLEX MICROSCOPIC
Nitrite: NEGATIVE
Specific Gravity, Urine: 1.028 (ref 1.005–1.030)
Urobilinogen, UA: 1 mg/dL (ref 0.0–1.0)
pH: 5 (ref 5.0–8.0)

## 2011-12-20 LAB — PREPARE RBC (CROSSMATCH)

## 2011-12-20 LAB — CBC
HCT: 41.1 % (ref 39.0–52.0)
Hemoglobin: 14.2 g/dL (ref 13.0–17.0)
RBC: 4.69 MIL/uL (ref 4.22–5.81)
WBC: 12.2 10*3/uL — ABNORMAL HIGH (ref 4.0–10.5)

## 2011-12-20 LAB — GLUCOSE, CAPILLARY
Glucose-Capillary: 173 mg/dL — ABNORMAL HIGH (ref 70–99)
Glucose-Capillary: 151 mg/dL — ABNORMAL HIGH (ref 70–99)

## 2011-12-20 LAB — BLOOD GAS, ARTERIAL
Bicarbonate: 19.8 mEq/L — ABNORMAL LOW (ref 20.0–24.0)
FIO2: 0.21 %
TCO2: 20.7 mmol/L (ref 0–100)
pCO2 arterial: 27 mmHg — ABNORMAL LOW (ref 35.0–45.0)
pH, Arterial: 7.479 — ABNORMAL HIGH (ref 7.350–7.450)
pO2, Arterial: 121 mmHg — ABNORMAL HIGH (ref 80.0–100.0)

## 2011-12-20 LAB — CARDIAC PANEL(CRET KIN+CKTOT+MB+TROPI)
Relative Index: 0.6 (ref 0.0–2.5)
Total CK: 296 U/L — ABNORMAL HIGH (ref 7–232)

## 2011-12-20 LAB — URINE MICROSCOPIC-ADD ON

## 2011-12-20 LAB — SURGICAL PCR SCREEN: MRSA, PCR: NEGATIVE

## 2011-12-20 SURGERY — CREATION, BYPASS, ARTERIAL, AXILLARY TO BILATERAL FEMORAL, USING GRAFT
Anesthesia: General | Site: Leg Lower | Laterality: Right

## 2011-12-20 MED ORDER — 0.9 % SODIUM CHLORIDE (POUR BTL) OPTIME
TOPICAL | Status: DC | PRN
  Administered 2011-12-20: 1000 mL
  Administered 2011-12-20: 3000 mL

## 2011-12-20 MED ORDER — SODIUM CHLORIDE 0.9 % IV SOLN
INTRAVENOUS | Status: DC | PRN
  Administered 2011-12-20 (×4): via INTRAVENOUS

## 2011-12-20 MED ORDER — MUPIROCIN 2 % EX OINT
TOPICAL_OINTMENT | Freq: Two times a day (BID) | CUTANEOUS | Status: DC
  Filled 2011-12-20: qty 22

## 2011-12-20 MED ORDER — HEMOSTATIC AGENTS (NO CHARGE) OPTIME
TOPICAL | Status: DC | PRN
  Administered 2011-12-20: 1 via TOPICAL

## 2011-12-20 MED ORDER — MUPIROCIN 2 % EX OINT
TOPICAL_OINTMENT | CUTANEOUS | Status: AC
  Filled 2011-12-20: qty 22

## 2011-12-20 MED ORDER — VANCOMYCIN HCL IN DEXTROSE 1-5 GM/200ML-% IV SOLN
INTRAVENOUS | Status: AC
  Filled 2011-12-20: qty 200

## 2011-12-20 MED ORDER — LACTATED RINGERS IV SOLN
INTRAVENOUS | Status: DC | PRN
  Administered 2011-12-20: 16:00:00 via INTRAVENOUS

## 2011-12-20 MED ORDER — DIPHENHYDRAMINE HCL 50 MG/ML IJ SOLN: 12.5000 mg | Freq: Four times a day (QID) | INTRAMUSCULAR | Status: DC | PRN

## 2011-12-20 MED ORDER — ONDANSETRON HCL 4 MG/2ML IJ SOLN
4.0000 mg | Freq: Four times a day (QID) | INTRAMUSCULAR | Status: DC | PRN
  Administered 2011-12-21: 4 mg via INTRAVENOUS

## 2011-12-20 MED ORDER — LIDOCAINE HCL (CARDIAC) 20 MG/ML IV SOLN
INTRAVENOUS | Status: DC | PRN
  Administered 2011-12-20: 100 mg via INTRAVENOUS

## 2011-12-20 MED ORDER — ONDANSETRON HCL 4 MG/2ML IJ SOLN
INTRAMUSCULAR | Status: DC | PRN
  Administered 2011-12-20: 4 mg via INTRAVENOUS

## 2011-12-20 MED ORDER — NALOXONE HCL 0.4 MG/ML IJ SOLN: 0.4000 mg | INTRAMUSCULAR | Status: DC | PRN

## 2011-12-20 MED ORDER — LABETALOL HCL 5 MG/ML IV SOLN
INTRAVENOUS | Status: AC
  Filled 2011-12-20: qty 4

## 2011-12-20 MED ORDER — PROTAMINE SULFATE 10 MG/ML IV SOLN
INTRAVENOUS | Status: DC | PRN
  Administered 2011-12-20: 50 mg via INTRAVENOUS

## 2011-12-20 MED ORDER — IODIXANOL 320 MG/ML IV SOLN
INTRAVENOUS | Status: DC | PRN
  Administered 2011-12-20: 25 mL via INTRAVENOUS

## 2011-12-20 MED ORDER — PHENYLEPHRINE HCL 10 MG/ML IJ SOLN
INTRAMUSCULAR | Status: DC | PRN
  Administered 2011-12-20: 80 ug via INTRAVENOUS

## 2011-12-20 MED ORDER — NEOSTIGMINE METHYLSULFATE 1 MG/ML IJ SOLN
INTRAMUSCULAR | Status: DC | PRN
  Administered 2011-12-20: 3 mg via INTRAVENOUS

## 2011-12-20 MED ORDER — LACTATED RINGERS IV SOLN
INTRAVENOUS | Status: DC
  Administered 2011-12-20: 15:00:00 via INTRAVENOUS

## 2011-12-20 MED ORDER — VANCOMYCIN HCL IN DEXTROSE 1-5 GM/200ML-% IV SOLN: 1000.0000 mg | Freq: Two times a day (BID) | INTRAVENOUS | Status: DC

## 2011-12-20 MED ORDER — VANCOMYCIN HCL IN DEXTROSE 1-5 GM/200ML-% IV SOLN: 1000.0000 mg | INTRAVENOUS | Status: DC

## 2011-12-20 MED ORDER — HYDRALAZINE HCL 20 MG/ML IJ SOLN
10.0000 mg | INTRAMUSCULAR | Status: DC | PRN
  Filled 2011-12-20: qty 0.5

## 2011-12-20 MED ORDER — GLYCOPYRROLATE 0.2 MG/ML IJ SOLN
INTRAMUSCULAR | Status: DC | PRN
  Administered 2011-12-20: 0.4 mg via INTRAVENOUS

## 2011-12-20 MED ORDER — HYDROMORPHONE HCL PF 1 MG/ML IJ SOLN
0.2500 mg | INTRAMUSCULAR | Status: DC | PRN
  Administered 2011-12-21: 0.5 mg via INTRAVENOUS

## 2011-12-20 MED ORDER — ALBUMIN HUMAN 5 % IV SOLN
INTRAVENOUS | Status: DC | PRN
  Administered 2011-12-20 (×3): via INTRAVENOUS

## 2011-12-20 MED ORDER — MIDAZOLAM HCL 5 MG/5ML IJ SOLN
INTRAMUSCULAR | Status: DC | PRN
  Administered 2011-12-20: 2 mg via INTRAVENOUS

## 2011-12-20 MED ORDER — HEPARIN SODIUM (PORCINE) 1000 UNIT/ML IJ SOLN
INTRAMUSCULAR | Status: DC | PRN
  Administered 2011-12-20: 8 mL via INTRAVENOUS
  Administered 2011-12-20 (×2): 2 mL via INTRAVENOUS

## 2011-12-20 MED ORDER — IOHEXOL 300 MG/ML  SOLN
INTRAMUSCULAR | Status: DC | PRN
  Administered 2011-12-20: 150 mL via INTRAVENOUS

## 2011-12-20 MED ORDER — SODIUM CHLORIDE 0.9 % IR SOLN
Status: DC | PRN
  Administered 2011-12-20 (×2)

## 2011-12-20 MED ORDER — SODIUM CHLORIDE 0.9 % IJ SOLN: 9.0000 mL | INTRAMUSCULAR | Status: DC | PRN

## 2011-12-20 MED ORDER — ROCURONIUM BROMIDE 100 MG/10ML IV SOLN
INTRAVENOUS | Status: DC | PRN
  Administered 2011-12-20: 50 mg via INTRAVENOUS

## 2011-12-20 MED ORDER — PROPOFOL 10 MG/ML IV EMUL
INTRAVENOUS | Status: DC | PRN
  Administered 2011-12-20: 130 mg via INTRAVENOUS

## 2011-12-20 MED ORDER — HEPARIN SODIUM (PORCINE) 1000 UNIT/ML IJ SOLN
INTRAMUSCULAR | Status: AC
  Filled 2011-12-20: qty 1

## 2011-12-20 MED ORDER — SODIUM CHLORIDE 0.9 % IV SOLN: INTRAVENOUS | Status: DC

## 2011-12-20 MED ORDER — DIPHENHYDRAMINE HCL 12.5 MG/5ML PO ELIX: 12.5000 mg | ORAL_SOLUTION | Freq: Four times a day (QID) | ORAL | Status: DC | PRN

## 2011-12-20 MED ORDER — MORPHINE SULFATE (PF) 1 MG/ML IV SOLN
INTRAVENOUS | Status: AC
  Filled 2011-12-20: qty 25

## 2011-12-20 MED ORDER — VANCOMYCIN HCL 1000 MG IV SOLR
1000.0000 mg | INTRAVENOUS | Status: DC | PRN
  Administered 2011-12-20: 1000 mg via INTRAVENOUS

## 2011-12-20 MED ORDER — MORPHINE SULFATE (PF) 1 MG/ML IV SOLN
INTRAVENOUS | Status: DC
  Administered 2011-12-21: 25 mL via INTRAVENOUS

## 2011-12-20 MED ORDER — LABETALOL HCL 5 MG/ML IV SOLN
10.0000 mg | INTRAVENOUS | Status: DC | PRN
  Administered 2011-12-20: 10 mg via INTRAVENOUS
  Filled 2011-12-20: qty 4

## 2011-12-20 MED ORDER — ONDANSETRON HCL 4 MG/2ML IJ SOLN: 4.0000 mg | Freq: Once | INTRAMUSCULAR | Status: AC | PRN

## 2011-12-20 MED ORDER — FENTANYL CITRATE 0.05 MG/ML IJ SOLN
INTRAMUSCULAR | Status: DC | PRN
  Administered 2011-12-20: 50 ug via INTRAVENOUS
  Administered 2011-12-20 (×4): 100 ug via INTRAVENOUS
  Administered 2011-12-20: 50 ug via INTRAVENOUS
  Administered 2011-12-20 (×4): 100 ug via INTRAVENOUS
  Administered 2011-12-20: 50 ug via INTRAVENOUS
  Administered 2011-12-20: 100 ug via INTRAVENOUS
  Administered 2011-12-20: 50 ug via INTRAVENOUS

## 2011-12-20 SURGICAL SUPPLY — 82 items
ADH SKN CLS LQ APL DERMABOND (GAUZE/BANDAGES/DRESSINGS) ×3
BAG ISL DRAPE 18X18 STRL (DRAPES) ×3
BAG ISOLATION DRAPE 18X18 (DRAPES) IMPLANT
BANDAGE ELASTIC 4 VELCRO ST LF (GAUZE/BANDAGES/DRESSINGS) IMPLANT
BANDAGE ELASTIC 6 VELCRO ST LF (GAUZE/BANDAGES/DRESSINGS) ×1 IMPLANT
BANDAGE GAUZE ELAST BULKY 4 IN (GAUZE/BANDAGES/DRESSINGS) ×1 IMPLANT
BLADE SURG 10 STRL SS (BLADE) ×1 IMPLANT
CANISTER SUCTION 2500CC (MISCELLANEOUS) ×4 IMPLANT
CATH EMB 3FR 80CM (CATHETERS) ×1 IMPLANT
CATH EMB 4FR 40CM (CATHETERS) ×1 IMPLANT
CATH EMB 5FR 80CM (CATHETERS) ×1 IMPLANT
CATH EMB 6FR 80CM (CATHETERS) ×1 IMPLANT
CLIP TI MEDIUM 24 (CLIP) ×4 IMPLANT
CLIP TI WIDE RED SMALL 24 (CLIP) ×4 IMPLANT
CLOTH BEACON ORANGE TIMEOUT ST (SAFETY) ×4 IMPLANT
COVER PROBE W GEL 5X96 (DRAPES) ×1 IMPLANT
COVER SURGICAL LIGHT HANDLE (MISCELLANEOUS) ×4 IMPLANT
DERMABOND ADHESIVE PROPEN (GAUZE/BANDAGES/DRESSINGS) ×1
DERMABOND ADVANCED .7 DNX6 (GAUZE/BANDAGES/DRESSINGS) IMPLANT
DRAIN SNY 10X20 3/4 PERF (WOUND CARE) IMPLANT
DRAPE C-ARM 42X72 X-RAY (DRAPES) ×1 IMPLANT
DRAPE INCISE IOBAN 66X45 STRL (DRAPES) ×5 IMPLANT
DRAPE ISOLATION BAG 18X18 (DRAPES) ×1
DRAPE WARM FLUID 44X44 (DRAPE) ×4 IMPLANT
DRSG COVADERM 4X6 (GAUZE/BANDAGES/DRESSINGS) ×1 IMPLANT
DRSG COVADERM 4X8 (GAUZE/BANDAGES/DRESSINGS) IMPLANT
DRSG PAD ABDOMINAL 8X10 ST (GAUZE/BANDAGES/DRESSINGS) ×1 IMPLANT
ELECT REM PT RETURN 9FT ADLT (ELECTROSURGICAL) ×4
ELECTRODE REM PT RTRN 9FT ADLT (ELECTROSURGICAL) ×3 IMPLANT
EVACUATOR SILICONE 100CC (DRAIN) IMPLANT
GAUZE SPONGE 4X4 16PLY XRAY LF (GAUZE/BANDAGES/DRESSINGS) ×1 IMPLANT
GLOVE BIO SURGEON STRL SZ7 (GLOVE) ×8 IMPLANT
GLOVE BIO SURGEON STRL SZ7.5 (GLOVE) ×3 IMPLANT
GLOVE BIOGEL PI IND STRL 6.5 (GLOVE) IMPLANT
GLOVE BIOGEL PI IND STRL 7.5 (GLOVE) ×3 IMPLANT
GLOVE BIOGEL PI INDICATOR 6.5 (GLOVE) ×4
GLOVE BIOGEL PI INDICATOR 7.5 (GLOVE) ×5
GLOVE ECLIPSE 6.0 STRL STRAW (GLOVE) ×1 IMPLANT
GLOVE ECLIPSE 6.5 STRL STRAW (GLOVE) ×1 IMPLANT
GLOVE SURG SS PI 7.5 STRL IVOR (GLOVE) ×2 IMPLANT
GOWN STRL NON-REIN LRG LVL3 (GOWN DISPOSABLE) ×14 IMPLANT
GRAFT HEMASHIELD 8MM (Vascular Products) ×4 IMPLANT
GRAFT VASC STRG 30X8KNIT (Vascular Products) IMPLANT
HEMOSTAT SURGICEL 2X14 (HEMOSTASIS) IMPLANT
INSERT FOGARTY SM (MISCELLANEOUS) IMPLANT
KIT BASIN OR (CUSTOM PROCEDURE TRAY) ×4 IMPLANT
KIT ROOM TURNOVER OR (KITS) ×4 IMPLANT
NS IRRIG 1000ML POUR BTL (IV SOLUTION) ×8 IMPLANT
PACK GENERAL/GYN (CUSTOM PROCEDURE TRAY) ×3 IMPLANT
PACK PERIPHERAL VASCULAR (CUSTOM PROCEDURE TRAY) ×4 IMPLANT
PACK UNIVERSAL I (CUSTOM PROCEDURE TRAY) ×3 IMPLANT
PAD ARMBOARD 7.5X6 YLW CONV (MISCELLANEOUS) ×8 IMPLANT
SET MICROPUNCTURE 5F STIFF (MISCELLANEOUS) ×1 IMPLANT
SPONGE GAUZE 4X4 12PLY (GAUZE/BANDAGES/DRESSINGS) ×4 IMPLANT
SPONGE SURGIFOAM ABS GEL 100 (HEMOSTASIS) ×1 IMPLANT
STAPLER VISISTAT 35W (STAPLE) ×1 IMPLANT
STOPCOCK MORSE 400PSI 3WAY (MISCELLANEOUS) ×1 IMPLANT
STRIP CLOSURE SKIN 1/2X4 (GAUZE/BANDAGES/DRESSINGS) IMPLANT
SUT ETHILON 3 0 PS 1 (SUTURE) IMPLANT
SUT MNCRL AB 4-0 PS2 18 (SUTURE) ×8 IMPLANT
SUT PROLENE 4 0 RB 1 (SUTURE) ×16
SUT PROLENE 4-0 RB1 .5 CRCL 36 (SUTURE) IMPLANT
SUT PROLENE 5 0 C 1 24 (SUTURE) ×18 IMPLANT
SUT PROLENE 6 0 BV (SUTURE) ×4 IMPLANT
SUT PROLENE 7 0 BV 1 (SUTURE) ×1 IMPLANT
SUT SILK 2 0 FS (SUTURE) IMPLANT
SUT SILK 2 0 SH (SUTURE) ×1 IMPLANT
SUT SILK 3 0 (SUTURE)
SUT SILK 3-0 18XBRD TIE 12 (SUTURE) IMPLANT
SUT VIC AB 2-0 CT1 27 (SUTURE) ×24
SUT VIC AB 2-0 CT1 TAPERPNT 27 (SUTURE) ×9 IMPLANT
SUT VIC AB 2-0 CTX 36 (SUTURE) IMPLANT
SUT VIC AB 3-0 SH 27 (SUTURE) ×8
SUT VIC AB 3-0 SH 27X BRD (SUTURE) ×9 IMPLANT
SYR 3ML LL SCALE MARK (SYRINGE) ×1 IMPLANT
SYR 5ML LL (SYRINGE) ×1 IMPLANT
SYR TB 1ML 25GX5/8 (SYRINGE) ×1 IMPLANT
TOWEL OR 17X24 6PK STRL BLUE (TOWEL DISPOSABLE) ×9 IMPLANT
TOWEL OR 17X26 10 PK STRL BLUE (TOWEL DISPOSABLE) ×8 IMPLANT
TRAY FOLEY CATH 14FRSI W/METER (CATHETERS) ×4 IMPLANT
TUBING EXTENTION W/L.L. (IV SETS) ×1 IMPLANT
WATER STERILE IRR 1000ML POUR (IV SOLUTION) ×4 IMPLANT

## 2011-12-20 NOTE — H&P (View-Only) (Signed)
[facility]  Referred by:  Self-referral  Reason for referral: Cold legs, problems walking  History of Present Illness  Logan [name] is a 66 y.o. (09/22/1945) male, s/p multiple revascularization procedures including Aobifem BPG, multiple fem-fem, and L fem-pop, who presents with chief complaint: Bilateral leg pain.  Pain moderate to severe, constant, and has worsened since 2 weeks ago.  Pt has had claudication for multiple years near.  He now has BLE rest pain with mildly decrease in sensation in both feets.  Past Medical History  Diagnosis Date  . Overweight   . Dyslipidemia   . Cardiomyopathy   . Diabetes mellitus   . Coronary artery disease     taxis DES stent circumflex 02/2003.... residual total distal circumflex and 50% LAD/ cardiolite 08/2006 inferior scar no ischemia  . PVD (peripheral vascular disease)     right to left fem -fem bypass 1997/ aortabifemoral bypass 2005 right to left fem-fem 07/2004. Secondary to occluded righ tlimb of aortobifemoral bypass  . Tobacco abuse     Past Surgical History  Procedure Date  . Knee surgery   . Stents     History   Social History  . Marital Status: Married    Spouse Name: N/A    Number of Children: N/A  . Years of Education: N/A   Occupational History  . Not on file.   Social History Main Topics  . Smoking status: Current Everyday Smoker -- 1.0 packs/day    Types: Cigarettes  . Smokeless tobacco: Never Used  . Alcohol Use: Yes     liquior sometimes  . Drug Use: No  . Sexually Active: Not on file   Other Topics Concern  . Not on file   Social History Narrative  . No narrative on file    No family history on file.  Current Outpatient Prescriptions on File Prior to Visit  Medication Sig Dispense Refill  . aspirin 81 MG tablet Take 81 mg by mouth daily.      . glimepiride (AMARYL) 4 MG tablet Take 4 mg by mouth 2 (two) times daily.      . hydrochlorothiazide (HYDRODIURIL) 25 MG  tablet Take 25 mg by mouth daily.      . insulin glargine (LANTUS) 100 UNIT/ML injection Inject into the skin as directed.      . lisinopril (PRINIVIL,ZESTRIL) 40 MG tablet Take 40 mg by mouth daily.      . metFORMIN (GLUCOPHAGE) 500 MG tablet Take 1,000 mg by mouth 2 (two) times daily with a meal.      . metoprolol (LOPRESSOR) 50 MG tablet Take 50 mg by mouth 2 (two) times daily.      . Multiple Vitamins-Minerals (MULTIVITAMIN PO) Take by mouth.      . Omega-3 Fatty Acids (FISH OIL) 1000 MG CAPS Take by mouth.      . pioglitazone (ACTOS) 45 MG tablet Take 45 mg by mouth daily.      . simvastatin (ZOCOR) 20 MG tablet Take 20 mg by mouth every evening.        Allergies  Allergen Reactions  . Aspirin   . Penicillins     Review of Systems (Positive items checked otherwise negative)  General: [ ] Weight loss, [ ] Weight gain, [ ]  Loss of appetite, [ ] Fever  Neurologic: [ ] Dizziness, [ ] Blackouts, [ ] Headaches, [ ] Seizure  Ear/Nose/Throat: [ ] Change in eyesight, [ ] Change in hearing, [ ]   Nose bleeds, [ ] Sore throat  Vascular: [x] Pain in legs with walking, [x] Pain in feet while lying flat, [ ] Non-healing ulcer, [ ] Stroke, [ ] "Mini stroke", [ ] Slurred speech, [ ] Temporary blindness, [ ] Blood clot in vein, [ ] Phlebitis  Pulmonary: [ ] Home oxygen, [ ] Productive cough, [ ] Bronchitis, [ ] Coughing up blood, [ ] Asthma, [ ] Wheezing  Musculoskeletal: [ ] Arthritis, [ ] Joint pain, [ ] Muscle pain  Cardiac: [ ] Chest pain, [ ] Chest tightness/pressure, [ ] Shortness of breath when lying flat, [ ] Shortness of breath with exertion, [ ] Palpitations, [ ] Heart murmur, [ ] Arrythmia, [ ] Atrial fibrillation  Hematologic: [ ] Bleeding problems, [ ] Clotting disorder, [ ] Anemia  Psychiatric:  [ ] Depression, [ ] Anxiety, [ ] Attention deficit disorder  Gastrointestinal:  [ ] Black stool,[ ]  Blood in stool, [ ] Peptic ulcer disease, [ ] Reflux, [ ] Hiatal hernia, [ ] Trouble  swallowing, [ ] Diarrhea, [ ] Constipation  Urinary:  [ ] Kidney disease, [ ] Burning with urination, [ ] Frequent urination, [ ] Difficulty urinating  Skin: [ ] Ulcers, [ ] Rashes   Physical Examination  Filed Vitals:   12/20/11 1151  BP: 137/59  Pulse: 88  Temp: 98.5 F (36.9 C)  TempSrc: Oral  Height: 5' 9" (1.753 m)  Weight: 221 lb (100.245 kg)  SpO2: 100%   Body mass index is 32.64 kg/(m^2).  General: A&O x 3, WDWN, obese  Head: [place]/AT  Ear/Nose/Throat: Hearing grossly intact, nares w/o erythema or drainage, oropharynx w/o Erythema/Exudate  Eyes: PERRLA, EOMI  Neck: Supple, no nuchal rigidity, no palpable LAD  Pulmonary: Sym exp, good air movt, CTAB, no rales, rhonchi, & wheezing  Cardiac: RRR, Nl S1, S2, no Murmurs, rubs or gallops  Vascular: Vessel Right Left  Radial Not-Palpable Palpable  Brachial Not-Palpable Palpable  Carotid Palpable, without bruit Palpable, without bruit  Aorta Non-palpable N/A  Femoral Non-Palpable Non-Palpable  Popliteal Non-palpable Non-palpable  PT Non-Palpable Non-Palpable  DP Non-Palpable Non-Palpable   Gastrointestinal: soft, NTND, -G/R, - HSM, - masses, - CVAT B  Musculoskeletal: BLE M/S 5/5, BLE 3-4/5, DF/PF intact bilaterally, Extremities without ischemic changes , cool BLE legs from thigh down, no gangrene or ulcers  Neurologic: CN 2-12 intact , Pain and light touch intact in upper extremities, decreased sensation in both feet , Motor exam as listed above  Psychiatric: Judgment intact, Mood & affect appropriate for pt's clinical situation  Dermatologic: See M/S exam for extremity exam, no rashes otherwise noted  Lymph : No Cervical, Axillary, or Inguinal lymphadenopathy   Non-Invasive Vascular Imaging  ABI (Date: 12/20/11)  RLE: 0  LLE: 0  Aortoiliac Duplex (Date: 12/20/11)  Technically difficult study  Aobifemoral BPG is occluded from mid-distal graft   Left limb is occluded  Fem-femoral is  occluded  Medical Decision Making  Logan [name] is a 66 y.o. male who presents with: BLE threatened limb.   This patient is NOT a candidate for redo Aobifemoral bypass given prior two retroperitoneal procedure already completed.  I would offer him an emergent: L ax-fem-fem BPG, BLE thrombectomy (likely groin and calf approaches), and likely BLE fasciotomies.  Both groins have been operated on multiple times, so he is at very high risk of injury of nerves, arteries, and veins in either groin.  This procedure also needs to be done emergently, so there   is no time for cardiac optimization.  With his significant cardiac history, he is at risk for myocardial infarction and death.  He is aware even with the operation he is at risk for limb loss bilaterally.  I discussed in depth with the patient the nature of atherosclerosis, and emphasized the importance of maximal medical management including strict control of blood pressure, blood glucose, and lipid levels, antiplatelet agents, obtaining regular exercise, and cessation of smoking.    The patient is aware that without maximal medical management the underlying atherosclerotic disease process will progress, limiting the benefit of any interventions. The risk, benefits, and alternative for bypass operations were discussed with the patient.  The patient is aware the risks include but are not limited to: bleeding, infection, myocardial infarction, stroke, limb loss, nerve damage, need for additional procedures in the future, wound complications, and inability to complete the bypass.   The patient is aware of these risks are high in his case and agreed to proceed.  Thank you for allowing us to participate in this patient's care.  Aaleah Hirsch, MD [facility] [place] [facility] Office: [phone] Pager: [phone]  12/20/2011, 12:27 PM   

## 2011-12-20 NOTE — Progress Notes (Signed)
[facility] [place]  Referred by:  Self-referral  Reason for referral: Cold legs, problems walking  History of Present Illness  Logan [name] is a 66 y.o. (05-Mar-1946) male, s/p multiple revascularization procedures including Aobifem BPG, multiple fem-fem, and L fem-pop, who presents with chief complaint: Bilateral leg pain.  Pain moderate to severe, constant, and has worsened since 2 weeks ago.  Pt has had claudication for multiple years near.  He now has BLE rest pain with mildly decrease in sensation in both feets.  Past Medical History  Diagnosis Date  . Overweight   . Dyslipidemia   . Cardiomyopathy   . Diabetes mellitus   . Coronary artery disease     taxis DES stent circumflex 02/2003.... residual total distal circumflex and 50% LAD/ cardiolite 08/2006 inferior scar no ischemia  . PVD (peripheral vascular disease)     right to left fem -fem bypass 1997/ aortabifemoral bypass 2005 right to left fem-fem 07/2004. Secondary to occluded righ tlimb of aortobifemoral bypass  . Tobacco abuse     Past Surgical History  Procedure Date  . Knee surgery   . Stents     History   Social History  . Marital Status: Married    Spouse Name: N/A    Number of Children: N/A  . Years of Education: N/A   Occupational History  . Not on file.   Social History Main Topics  . Smoking status: Current Everyday Smoker -- 1.0 packs/day    Types: Cigarettes  . Smokeless tobacco: Never Used  . Alcohol Use: Yes     liquior sometimes  . Drug Use: No  . Sexually Active: Not on file   Other Topics Concern  . Not on file   Social History Narrative  . No narrative on file    No family history on file.  Current Outpatient Prescriptions on File Prior to Visit  Medication Sig Dispense Refill  . aspirin 81 MG tablet Take 81 mg by mouth daily.      Marland Kitchen glimepiride (AMARYL) 4 MG tablet Take 4 mg by mouth 2 (two) times daily.      . hydrochlorothiazide (HYDRODIURIL) 25 MG  tablet Take 25 mg by mouth daily.      . insulin glargine (LANTUS) 100 UNIT/ML injection Inject into the skin as directed.      Marland Kitchen lisinopril (PRINIVIL,ZESTRIL) 40 MG tablet Take 40 mg by mouth daily.      . metFORMIN (GLUCOPHAGE) 500 MG tablet Take 1,000 mg by mouth 2 (two) times daily with a meal.      . metoprolol (LOPRESSOR) 50 MG tablet Take 50 mg by mouth 2 (two) times daily.      . Multiple Vitamins-Minerals (MULTIVITAMIN PO) Take by mouth.      . Omega-3 Fatty Acids (FISH OIL) 1000 MG CAPS Take by mouth.      . pioglitazone (ACTOS) 45 MG tablet Take 45 mg by mouth daily.      . simvastatin (ZOCOR) 20 MG tablet Take 20 mg by mouth every evening.        Allergies  Allergen Reactions  . Aspirin   . Penicillins     Review of Systems (Positive items checked otherwise negative)  General: [ ]  Weight loss, [ ]  Weight gain, [ ]   Loss of appetite, [ ]  Fever  Neurologic: [ ]  Dizziness, [ ]  Blackouts, [ ]  Headaches, [ ]  Seizure  Ear/Nose/Throat: [ ]  Change in eyesight, [ ]  Change in hearing, [ ]   Nose bleeds, [ ]  Sore throat  Vascular: [x]  Pain in legs with walking, [x]  Pain in feet while lying flat, [ ]  Non-healing ulcer, [ ]  Stroke, [ ]  "Mini stroke", [ ]  Slurred speech, [ ]  Temporary blindness, [ ]  Blood clot in vein, [ ]  Phlebitis  Pulmonary: [ ]  Home oxygen, [ ]  Productive cough, [ ]  Bronchitis, [ ]  Coughing up blood, [ ]  Asthma, [ ]  Wheezing  Musculoskeletal: [ ]  Arthritis, [ ]  Joint pain, [ ]  Muscle pain  Cardiac: [ ]  Chest pain, [ ]  Chest tightness/pressure, [ ]  Shortness of breath when lying flat, [ ]  Shortness of breath with exertion, [ ]  Palpitations, [ ]  Heart murmur, [ ]  Arrythmia, [ ]  Atrial fibrillation  Hematologic: [ ]  Bleeding problems, [ ]  Clotting disorder, [ ]  Anemia  Psychiatric:  [ ]  Depression, [ ]  Anxiety, [ ]  Attention deficit disorder  Gastrointestinal:  [ ]  Black stool,[ ]   Blood in stool, [ ]  Peptic ulcer disease, [ ]  Reflux, [ ]  Hiatal hernia, [ ]  Trouble  swallowing, [ ]  Diarrhea, [ ]  Constipation  Urinary:  [ ]  Kidney disease, [ ]  Burning with urination, [ ]  Frequent urination, [ ]  Difficulty urinating  Skin: [ ]  Ulcers, [ ]  Rashes   Physical Examination  Filed Vitals:   12/20/11 1151  BP: 137/59  Pulse: 88  Temp: 98.5 F (36.9 C)  TempSrc: Oral  Height: 5\' 9"  (1.753 m)  Weight: 221 lb (100.245 kg)  SpO2: 100%   Body mass index is 32.64 kg/(m^2).  General: A&O x 3, WDWN, obese  Head: [place]/AT  Ear/Nose/Throat: Hearing grossly intact, nares w/o erythema or drainage, oropharynx w/o Erythema/Exudate  Eyes: PERRLA, EOMI  Neck: Supple, no nuchal rigidity, no palpable LAD  Pulmonary: Sym exp, good air movt, CTAB, no rales, rhonchi, & wheezing  Cardiac: RRR, Nl S1, S2, no Murmurs, rubs or gallops  Vascular: Vessel Right Left  Radial Not-Palpable Palpable  Brachial Not-Palpable Palpable  Carotid Palpable, without bruit Palpable, without bruit  Aorta Non-palpable N/A  Femoral Non-Palpable Non-Palpable  Popliteal Non-palpable Non-palpable  PT Non-Palpable Non-Palpable  DP Non-Palpable Non-Palpable   Gastrointestinal: soft, NTND, -G/R, - HSM, - masses, - CVAT B  Musculoskeletal: BLE M/S 5/5, BLE 3-4/5, DF/PF intact bilaterally, Extremities without ischemic changes , cool BLE legs from thigh down, no gangrene or ulcers  Neurologic: CN 2-12 intact , Pain and light touch intact in upper extremities, decreased sensation in both feet , Motor exam as listed above  Psychiatric: Judgment intact, Mood & affect appropriate for pt's clinical situation  Dermatologic: See M/S exam for extremity exam, no rashes otherwise noted  Lymph : No Cervical, Axillary, or Inguinal lymphadenopathy   Non-Invasive Vascular Imaging  ABI (Date: 12/20/11)  RLE: 0  LLE: 0  Aortoiliac Duplex (Date: 12/20/11)  Technically difficult study  Aobifemoral BPG is occluded from mid-distal graft   Left limb is occluded  Fem-femoral is  occluded  Medical Decision Making  Logan [name] is a 66 y.o. male who presents with: BLE threatened limb.   This patient is NOT a candidate for redo Aobifemoral bypass given prior two retroperitoneal procedure already completed.  I would offer him an emergent: L ax-fem-fem BPG, BLE thrombectomy (likely groin and calf approaches), and likely BLE fasciotomies.  Both groins have been operated on multiple times, so he is at very high risk of injury of nerves, arteries, and veins in either groin.  This procedure also needs to be done emergently, so there  is no time for cardiac optimization.  With his significant cardiac history, he is at risk for myocardial infarction and death.  He is aware even with the operation he is at risk for limb loss bilaterally.  I discussed in depth with the patient the nature of atherosclerosis, and emphasized the importance of maximal medical management including strict control of blood pressure, blood glucose, and lipid levels, antiplatelet agents, obtaining regular exercise, and cessation of smoking.    The patient is aware that without maximal medical management the underlying atherosclerotic disease process will progress, limiting the benefit of any interventions. The risk, benefits, and alternative for bypass operations were discussed with the patient.  The patient is aware the risks include but are not limited to: bleeding, infection, myocardial infarction, stroke, limb loss, nerve damage, need for additional procedures in the future, wound complications, and inability to complete the bypass.   The patient is aware of these risks are high in his case and agreed to proceed.  Thank you for allowing [place] to participate in this patient's care.  Leonides Sake, MD [facility] [place] Office: [phone] Pager: [phone]  12/20/2011, 12:27 PM

## 2011-12-20 NOTE — Anesthesia Preprocedure Evaluation (Addendum)
Anesthesia Evaluation  Patient identified by MRN, date of birth, ID band Patient awake    Reviewed: Allergy & Precautions, H&P , NPO status , Patient's Chart, lab work & pertinent test results  Airway Mallampati: I TM Distance: >3 FB Neck ROM: full    Dental   Pulmonary shortness of breath, COPD         Cardiovascular hypertension, + angina + CAD and + Peripheral Vascular Disease Rhythm:regular Rate:Normal  Cardiomyopathy ef up to 50% from 20 % in 2000. S/p stent lad   Neuro/Psych    GI/Hepatic   Endo/Other  Type 2, Oral Hypoglycemic Agents  Renal/GU      Musculoskeletal   Abdominal   Peds  Hematology   Anesthesia Other Findings   Reproductive/Obstetrics                          Anesthesia Physical Anesthesia Plan  ASA: IV  Anesthesia Plan: General   Post-op Pain Management:    Induction: Intravenous  Airway Management Planned: Oral ETT  Additional Equipment: Arterial line  Intra-op Plan:   Post-operative Plan: Extubation in OR and Possible Post-op intubation/ventilation  Informed Consent: I have reviewed the patients History and Physical, chart, labs and discussed the procedure including the risks, benefits and alternatives for the proposed anesthesia with the patient or authorized representative who has indicated his/her understanding and acceptance.     Plan Discussed with: CRNA, Anesthesiologist and Surgeon  Anesthesia Plan Comments:         Anesthesia Quick Evaluation

## 2011-12-20 NOTE — Op Note (Signed)
OPERATIVE NOTE   PROCEDURE: 1. Left aortobifemoral limb thrombectomy 2. Femorofemoral graft thrombectomy 3. Revision of femorofemoral graft with interposition graft 4. Right tibial thrombectomy 5. Right four-compartment fasciotomies 6. Bilateral leg runoff  PRE-OPERATIVE DIAGNOSIS: Bilateral threatened limbs  POST-OPERATIVE DIAGNOSIS: same as above   SURGEON: Leonides Sake, MD  ASSISTANT(S): Della Goo, [facility]   ANESTHESIA: general  ESTIMATED BLOOD LOSS: 1800 cc  BLOOD: 2 units pRBC, 270 cc BRAT  FINDING(S): 1. Heavily calcified left aortobifemoral limb 2. Retrograde bleeding from both profunda arteries 3. Bilateral profunda blood flow intact on angiogram 4. Patent left peroneal artery on angiogram 5. Chronically occluded right anterior tibial artery  Right tibioperoneal trunk, peroneal and posterior tibial arteries open: weakly dopplerable signal Partial dead right anterior compartment musculature  SPECIMEN(S):  Thrombus from left aortobifemoral limb  INDICATIONS:   Logan [name] is a 66 y.o. male who presents with bilateral.threatened limbs.  Unfortunately, this patient's bilateral leg ischemia had been ongoing for 2 weeks prior to presentation.  I discussed with the patient and family the need to emergently proceed with revascularization and fasciotomies of both legs.  The risk, benefits, and alternative for bypass operations were discussed with the patient.  The patient is aware the risks include but are not limited to: bleeding, infection, myocardial infarction, stroke, limb loss, nerve damage, need for additional procedures in the future, wound complications, graft injection, and inability to complete the bypass.  I emphasized to the patient that he likely out of the six hour window in which his legs could be salvaged, but we would not known without trying to salvage both legs.  The patient is aware of these risks and agreed to proceed.   DESCRIPTION: After obtain full  informed written consent, the patient was brought back to the operating room and placed supine upon the operating table.  The patient received IV antibiotics prior to induction.  After obtaining adequate anesthesia, the patient was prepped and draped in the standard fashion for: left axillofemoral bypass.  I turned my attention first to the left groin.  I made an incision through the prior incision on this side.  Using blunt dissection and electrocautery, I carved the femorofemoral and aortobifemoral limb out of the severely scar groin tissue. I sharply took the femorofemoral graft off the left aortobifemoral limb.  There was retrograde bleeding from the profunda artery on this side.  There no bleeding from the left aortobifemoral limb.  I passed a 4 Fogarty up the profunda artery and extracted some fresh clot. There was arterial return from this artery.  I clamped the aortobifemoral graft proximal to its connection to the profunda.  I then placed a 5 Fogarty through the femorofemoral graft and obtained acute thrombus from the graft and return of weak arterial bleeding.  I then turned my attention to the right groin.  I made an incision through the prior incision on this side.  Using blunt dissection and electrocautery, I carved the femorofemoral limb out of the severely scar groin tissue. I made a graftotomy and passed a 4 Fogarty down into the distal femorofemoral graft into the native common femoral artery, superficial femoral artery and profunda femoral artery on the right side.  From these arteries, I was able to extract acute thrombus.  I was able to obtain brisk arterial backbleeding at this point.   I clamped the distal femorofemoral graft.  At this point, I gave 8000 units of Heparin intravenous which was therapeutic bolus.  In total, I gave  12000 units of Heparin, giving another 2000 units every subsequent hour.  After waiting three minutes, I pass the 5 Fogarty partial up the left aortobifemoral graft,  obtaining chronic thrombus.  Eventually, I had to switch to a 6 Fogarty balloon, with which, I was able to extract chronic clot.  Based on the tactile resistance, I could tell the proximal graft was heavily calcified.  Eventually, I was able to pop the arterial meniscus and return brisk aortic quality bleeding.  I clamped the aortobifemoral limb.  I tried to reapproximated the femorofemoral graft and the aortobifemoral limb, but there was too much tension to sew these two together, so I obtained a 8 mm piece of Dacron and fashioned sharply an interposition graft.  I spatulated this graft to function in such capacity.  I sewed the old femorofemoral graft to the interposition graft in an end-to-end fashion with a running stitch of 5-0 Prolene.  I sewed the interposition graft to the aortobifemoral limb in an end-to-side configuration.  Prior to completing this anastomosis, I back bled the profunda which was brisk and allowed the aortobifemoral graft to bleed antegrade, which was brisk.  The anastomosis was completed in the usual fashion.  I then turned my attention to the right groin.  I passed the 4 Fogarty back through the arteries in the right groin, extracting no further clot.  I repaired the graftotomy in the femorofemoral graft with a running stitch of 5-0 Prolene.  Prior to completing this repair, I backbled the right femoral arteries, which was brisk, and allowed the femorofemoral graft to bleed in antegrade fashion, which was also brisk.  The grafotomy was closed with the running stitch.  At this point, I check both feet for a doppler signal and none could be found.  I then cannulated the femorofemoral graft with a micropuncture needle and passed a microwire into the graft.  I then placed the microsheath into the graft.  Using this I completed an angiogram of the right limb.  Unfortunately, due to the poor blood flow in the right leg, I could only visualize the vasculature in the above the knee segment.  The  profunda and proximal superficial femoral artery appeared patent with occlusion of the at knee segment.  There was limited profunda arterial collaterals visualized.  I felt this was require a thrombectomy of the right tibial arteries.  I then placed a 5-0 Prolene Z-stitch around the microsheath and pulled the sheath while tying down the stitch.  In a similar fashion, I cannulated the left aortobifemoral limb.  I did hand injections on this side also, demonstrating a patent profunda artery and peroneal runoff to the left foot.  Subsequently, I did not think this patient needed a tibial thrombectomy and did not not immediately need fasciotomies on this leg.  I then placed a 5-0 Prolene Z-stitch around the microsheath and pulled the sheath while tying down the stitch.  I then placed a bump behind the right knee and externally rotated the lower leg.  I made an incision one finger width behind the tibia.  Using electrocautery and blunt dissection, I opened the subcutaneous tissue and fascia until I had the popliteal artery exposed.  The popliteal artery was more lateral than usual.  I dissected out the popliteal vein and pulled into medial.  Note, during this dissection, it was clear to me evidence of venous thrombus in the smaller veins.  Eventually, I had the distal popliteal artery and the trifurcation dissected out.  There  was an anterior tibial artery takeoff and a tibioperoneal trunk evident.  I made a transverse arteriotomy and passed 4 and 5 Fogarty balloons proximally.  I was able to extract acute close from the superficial femoral artery.  I clamped the proximal end of the popliteal artery.  I then passed a 4 Fogarty distally, extracting chronic clot from the posterior tibial artery and peroneal artery.  I was no able to thrombectomize the anterior tibial artery due to chronic total occlusion of this artery.  I repaired this artery with a running stitch of 6-0 Prolene.  Prior completing this repair, I  backbleeding the tibioperoneal trunk and allowed the popliteal artery to bleed in an antegraade fashion.  Both ends were bleeding briskly.  I finished tying off the repair.  At this point, there were weak posterior tibial artery and peroneal artery doppler signals.  At this point, I turned my attention to completing fasciotomies in this leg.  I extended the incision in the medial side of the calf.  I opened up the superficial and deep posterior compartments with electrocautery.  I then turned my attention to the lateral calf.  I made a longitudinal incision between the tibia and fibula.  I opened the anterior and lateral compartment along the lower leg.  All musculature was viable except in the anterior compartment where some muscle was no longer responsive to electrostimulation.  At this point, I gave the patient 50 mg Protamine to reverse the Heparin.  I repaired each groin incision with 2 layers of 2-0 Vicryl, 1 layer of 3-0 Vicryl, and 1 layer of 4-0 Monocryl in the skin.  I also loosely reapproximated some the deep muscle in the right calf to cover up the popliteal vessels to avoid desiccation and subsequent rupture.  The fasciotomies were packed with wet-to-dry dressings and ABD applied to the calf and wrapped with Kerllix and an ACE wrap.  Both groins closures were reinforced wth Dermabond.   In my opinion, the right leg is at risk for amputation, as the dead muscle in the anterior compartment , thrombosed veins, and chronic thrombus suggest this leg has been occluded for an extended period.  I am not proceeding with amputation per the patient's wishes.  The left leg is also at risk with single vessel runoff.  He already has a failed femoropopliteal bypass in this leg, so I suspect the single vessel runoff is his current baseline, which may not be enough.  Given the severity of the scarring, I do not recommend any further revascularization attempts.    COMPLICATIONS: none  CONDITION: stable  Leonides Sake, MD [facility] [place] Office: [phone] Pager: [phone]  12/20/2011, 11:09 PM

## 2011-12-20 NOTE — Progress Notes (Signed)
Ankle brachial index performed @ [facility] 12/20/2011

## 2011-12-20 NOTE — Preoperative (Signed)
Beta Blockers   Reason not to administer Beta Blockers:Not Applicable 

## 2011-12-20 NOTE — Anesthesia Postprocedure Evaluation (Signed)
Anesthesia Post Note  Patient: Logan [name]  Procedure(s) Performed: Procedure(s) (LRB): BYPASS GRAFT AXILLA-BIFEMORAL (Bilateral) FASCIOTOMY (Right) LOWER EXTREMITY ANGIOGRAM (Bilateral)  Anesthesia type: general  Patient location: PACU  Post pain: Pain level controlled  Post assessment: Patient's Cardiovascular Status Stable  Last Vitals:  Filed Vitals:   12/20/11 2309  BP:   Pulse: 102  Resp: 19    Post vital signs: Reviewed and stable  Level of consciousness: sedated  Complications: No apparent anesthesia complications

## 2011-12-20 NOTE — Transfer of Care (Signed)
Immediate Anesthesia Transfer of Care Note  Patient: Logan [name]  Procedure(s) Performed: Procedure(s) (LRB): BYPASS GRAFT AXILLA-BIFEMORAL (Bilateral) FASCIOTOMY (Right) LOWER EXTREMITY ANGIOGRAM (Bilateral)  Patient Location: PACU  Anesthesia Type: General  Level of Consciousness: awake and sedated  Airway & Oxygen Therapy: Patient Spontanous Breathing and Patient connected to face mask oxygen  Post-op Assessment: Report given to PACU RN, Post -op Vital signs reviewed and stable and Patient moving all extremities  Post vital signs: Reviewed and stable  Complications: No apparent anesthesia complications

## 2011-12-20 NOTE — Progress Notes (Signed)
ANTIBIOTIC CONSULT NOTE - INITIAL  Pharmacy Consult for vancomycin Indication: possible graft contamination  Allergies  Allergen Reactions  . Aspirin Hives  . Penicillins Other (See Comments)    Immune system shut down     Patient Measurements: Height: 5\' 9"  (175.3 cm) Weight: 214 lb 4.6 oz (97.2 kg) IBW/kg (Calculated) : 70.7   Vital Signs: Temp: 97.2 F (36.2 C) (08/02 2305) BP: 146/68 mmHg (08/02 2349) Pulse Rate: 119  (08/02 2349)  Labs:  Basename 12/20/11 1345  WBC 12.2*  HGB 14.2  PLT 172  LABCREA --  CREATININE 1.19   Estimated Creatinine Clearance: 69.3 ml/min (by C-G formula based on Cr of 1.19).  Microbiology: Recent Results (from the past 720 hour(s))  SURGICAL PCR SCREEN     Status: Normal   Collection Time   12/20/11  1:22 PM      Component Value Range Status Comment   MRSA, PCR NEGATIVE  NEGATIVE Final    Staphylococcus aureus NEGATIVE  NEGATIVE Final     Medical History: Past Medical History  Diagnosis Date  . Overweight   . Dyslipidemia   . Cardiomyopathy   . Diabetes mellitus   . Coronary artery disease     taxis DES stent circumflex 02/2003.... residual total distal circumflex and 50% LAD/ cardiolite 08/2006 inferior scar no ischemia  . PVD (peripheral vascular disease)     right to left fem -fem bypass 1997/ aortabifemoral bypass 2005 right to left fem-fem 07/2004. Secondary to occluded righ tlimb of aortobifemoral bypass  . Tobacco abuse   . Anginal pain   . Hypertension   . Shortness of breath   . No pertinent past medical history     cardiomyopathy      Medications:  Prescriptions prior to admission  Medication Sig Dispense Refill  . aspirin EC 81 MG tablet Take 81 mg by mouth daily.      Marland Kitchen glimepiride (AMARYL) 4 MG tablet Take 4 mg by mouth 2 (two) times daily.      . hydrochlorothiazide (HYDRODIURIL) 25 MG tablet Take 25 mg by mouth daily.      . insulin glargine (LANTUS) 100 UNIT/ML injection Inject 100 Units into the skin  daily.       Marland Kitchen lisinopril (PRINIVIL,ZESTRIL) 40 MG tablet Take 40 mg by mouth daily.      . metFORMIN (GLUCOPHAGE) 500 MG tablet Take 1,000 mg by mouth 2 (two) times daily with a meal.      . metoprolol (LOPRESSOR) 50 MG tablet Take 50 mg by mouth 2 (two) times daily.      . Multiple Vitamins-Minerals (MULTIVITAMIN PO) Take 1 tablet by mouth daily.       . Omega-3 Fatty Acids (FISH OIL) 1000 MG CAPS Take 1 capsule by mouth daily.       . pioglitazone (ACTOS) 45 MG tablet Take 45 mg by mouth daily.      . simvastatin (ZOCOR) 20 MG tablet Take 20 mg by mouth every evening.       Scheduled:    . labetalol      . morphine   Intravenous Q4H  . morphine      . mupirocin ointment   Nasal BID  . mupirocin ointment      . vancomycin      . DISCONTD: vancomycin  1,000 mg Intravenous 60 min Pre-Op  . DISCONTD: vancomycin  1,000 mg Intravenous Q12H    Assessment: 66yo male w/ h/o multiple revascularization procedures c/o worsening persistent  BLE pain at rest and decreased sensation in both feet, now s/p bypass graft and fasciotomy, to begin IV ABX for concern for graft contamination of both groins given multiple procedures.  Goal of Therapy:  Vancomycin trough level 15-20 mcg/ml  Plan:  Rec'd vancomycin 1g IV pre-op; will continue with vancomycin 1250mg  IV Q12H and monitor CBC, Cx, levels prn.  Colleen Can PharmD BCPS 12/20/2011,11:59 PM

## 2011-12-20 NOTE — Interval H&P Note (Signed)
[facility] [place]  History and Physical Update  The patient was interviewed and re-examined.  The patient's previous History and Physical has been reviewed and is unchanged.  There is no change in the plan of care.  Leonides Sake, MD [facility] [place] Office: [phone] Pager: [phone]  12/20/2011, 1:49 PM

## 2011-12-21 LAB — GLUCOSE, CAPILLARY
Glucose-Capillary: 221 mg/dL — ABNORMAL HIGH (ref 70–99)
Glucose-Capillary: 180 mg/dL — ABNORMAL HIGH (ref 70–99)

## 2011-12-21 LAB — BASIC METABOLIC PANEL
BUN: 19 mg/dL (ref 6–23)
Chloride: 107 mEq/L (ref 96–112)
GFR calc Af Amer: 76 mL/min — ABNORMAL LOW (ref >90)
Potassium: 5.1 mEq/L (ref 3.5–5.1)
Sodium: 138 mEq/L (ref 135–145)

## 2011-12-21 LAB — CBC
HCT: 37.8 % — ABNORMAL LOW (ref 39.0–52.0)
RDW: 14.1 % (ref 11.5–15.5)
WBC: 12.7 10*3/uL — ABNORMAL HIGH (ref 4.0–10.5)

## 2011-12-21 LAB — MYOGLOBIN, SERUM: Myoglobin: 176 ng/mL — ABNORMAL HIGH (ref <111)

## 2011-12-21 LAB — CARDIAC PANEL(CRET KIN+CKTOT+MB+TROPI)
Relative Index: 0.1 (ref 0.0–2.5)
Total CK: 22048 U/L — ABNORMAL HIGH (ref 7–232)

## 2011-12-21 MED ORDER — INSULIN ASPART 100 UNIT/ML ~~LOC~~ SOLN
0.0000 [IU] | Freq: Three times a day (TID) | SUBCUTANEOUS | Status: DC
  Administered 2011-12-21: 3 [IU] via SUBCUTANEOUS
  Administered 2011-12-21: 2 [IU] via SUBCUTANEOUS
  Administered 2011-12-21: 3 [IU] via SUBCUTANEOUS
  Administered 2011-12-22: 2 [IU] via SUBCUTANEOUS
  Administered 2011-12-22 – 2011-12-26 (×2): 5 [IU] via SUBCUTANEOUS

## 2011-12-21 MED ORDER — VANCOMYCIN HCL IN DEXTROSE 1-5 GM/200ML-% IV SOLN
1000.0000 mg | Freq: Two times a day (BID) | INTRAVENOUS | Status: DC
  Administered 2011-12-21 – 2011-12-23 (×4): 1000 mg via INTRAVENOUS
  Filled 2011-12-21 (×4): qty 200

## 2011-12-21 MED ORDER — SIMVASTATIN 20 MG PO TABS
20.0000 mg | ORAL_TABLET | Freq: Every evening | ORAL | Status: DC
  Administered 2011-12-21 – 2011-12-25 (×5): 20 mg via ORAL
  Filled 2011-12-21 (×7): qty 1

## 2011-12-21 MED ORDER — INSULIN GLARGINE 100 UNIT/ML ~~LOC~~ SOLN
40.0000 [IU] | Freq: Every day | SUBCUTANEOUS | Status: DC
  Administered 2011-12-21 – 2011-12-24 (×3): 40 [IU] via SUBCUTANEOUS

## 2011-12-21 MED ORDER — POTASSIUM CHLORIDE CRYS ER 20 MEQ PO TBCR
20.0000 meq | EXTENDED_RELEASE_TABLET | Freq: Every day | ORAL | Status: DC | PRN
  Filled 2011-12-21: qty 2

## 2011-12-21 MED ORDER — SODIUM CHLORIDE 0.9 % IV SOLN
INTRAVENOUS | Status: DC
  Administered 2011-12-21 – 2011-12-24 (×5): via INTRAVENOUS

## 2011-12-21 MED ORDER — SODIUM CHLORIDE 0.9 % IV SOLN: 500.0000 mL | Freq: Once | INTRAVENOUS | Status: AC | PRN

## 2011-12-21 MED ORDER — WHITE PETROLATUM GEL
Status: AC
  Filled 2011-12-21: qty 5

## 2011-12-21 MED ORDER — DOPAMINE-DEXTROSE 3.2-5 MG/ML-% IV SOLN
3.0000 ug/kg/min | INTRAVENOUS | Status: DC | PRN
  Filled 2011-12-21: qty 250

## 2011-12-21 MED ORDER — DOCUSATE SODIUM 100 MG PO CAPS
100.0000 mg | ORAL_CAPSULE | Freq: Every day | ORAL | Status: DC
  Administered 2011-12-21 – 2011-12-24 (×3): 100 mg via ORAL
  Filled 2011-12-21 (×4): qty 1

## 2011-12-21 MED ORDER — VANCOMYCIN HCL 1000 MG IV SOLR
1250.0000 mg | Freq: Two times a day (BID) | INTRAVENOUS | Status: DC
  Administered 2011-12-21: 1250 mg via INTRAVENOUS
  Filled 2011-12-21 (×2): qty 1250

## 2011-12-21 MED ORDER — PANTOPRAZOLE SODIUM 40 MG PO TBEC
40.0000 mg | DELAYED_RELEASE_TABLET | Freq: Every day | ORAL | Status: DC
  Administered 2011-12-21 – 2011-12-24 (×4): 40 mg via ORAL
  Filled 2011-12-21 (×5): qty 1

## 2011-12-21 MED ORDER — SENNOSIDES-DOCUSATE SODIUM 8.6-50 MG PO TABS
1.0000 | ORAL_TABLET | Freq: Every evening | ORAL | Status: DC | PRN
  Administered 2011-12-23: 1 via ORAL
  Filled 2011-12-21 (×2): qty 1

## 2011-12-21 MED ORDER — NALOXONE HCL 0.4 MG/ML IJ SOLN: 0.4000 mg | INTRAMUSCULAR | Status: DC | PRN

## 2011-12-21 MED ORDER — ONDANSETRON HCL 4 MG/2ML IJ SOLN
4.0000 mg | Freq: Four times a day (QID) | INTRAMUSCULAR | Status: DC | PRN
  Filled 2011-12-21: qty 2

## 2011-12-21 MED ORDER — PIOGLITAZONE HCL 45 MG PO TABS
45.0000 mg | ORAL_TABLET | Freq: Every day | ORAL | Status: DC
  Administered 2011-12-22 – 2011-12-23 (×2): 45 mg via ORAL
  Filled 2011-12-21 (×5): qty 1

## 2011-12-21 MED ORDER — METOPROLOL TARTRATE 50 MG PO TABS
50.0000 mg | ORAL_TABLET | Freq: Two times a day (BID) | ORAL | Status: DC
  Administered 2011-12-21 – 2011-12-26 (×10): 50 mg via ORAL
  Filled 2011-12-21 (×13): qty 1

## 2011-12-21 MED ORDER — DIPHENHYDRAMINE HCL 50 MG/ML IJ SOLN: 12.5000 mg | Freq: Four times a day (QID) | INTRAMUSCULAR | Status: DC | PRN

## 2011-12-21 MED ORDER — GUAIFENESIN-DM 100-10 MG/5ML PO SYRP: 15.0000 mL | ORAL_SOLUTION | ORAL | Status: DC | PRN

## 2011-12-21 MED ORDER — CLOPIDOGREL BISULFATE 75 MG PO TABS
75.0000 mg | ORAL_TABLET | Freq: Every day | ORAL | Status: DC
  Administered 2011-12-21 – 2011-12-26 (×6): 75 mg via ORAL
  Filled 2011-12-21 (×7): qty 1

## 2011-12-21 MED ORDER — INSULIN GLARGINE 100 UNIT/ML ~~LOC~~ SOLN
60.0000 [IU] | Freq: Every day | SUBCUTANEOUS | Status: DC
  Administered 2011-12-21 – 2011-12-23 (×3): 60 [IU] via SUBCUTANEOUS

## 2011-12-21 MED ORDER — BISACODYL 10 MG RE SUPP: 10.0000 mg | Freq: Every day | RECTAL | Status: DC | PRN

## 2011-12-21 MED ORDER — ACETAMINOPHEN 650 MG RE SUPP: 325.0000 mg | RECTAL | Status: DC | PRN

## 2011-12-21 MED ORDER — INSULIN ASPART 100 UNIT/ML ~~LOC~~ SOLN: 0.0000 [IU] | Freq: Three times a day (TID) | SUBCUTANEOUS | Status: DC

## 2011-12-21 MED ORDER — HYDROMORPHONE HCL PF 1 MG/ML IJ SOLN: 0.5000 mg | INTRAMUSCULAR | Status: DC | PRN

## 2011-12-21 MED ORDER — INSULIN GLARGINE 100 UNIT/ML ~~LOC~~ SOLN: 100.0000 [IU] | Freq: Every day | SUBCUTANEOUS | Status: DC

## 2011-12-21 MED ORDER — HYDROMORPHONE HCL PF 1 MG/ML IJ SOLN
INTRAMUSCULAR | Status: AC
  Filled 2011-12-21: qty 1

## 2011-12-21 MED ORDER — METOPROLOL TARTRATE 1 MG/ML IV SOLN: 2.0000 mg | INTRAVENOUS | Status: DC | PRN

## 2011-12-21 MED ORDER — LISINOPRIL 40 MG PO TABS
40.0000 mg | ORAL_TABLET | Freq: Every day | ORAL | Status: DC
  Administered 2011-12-21 – 2011-12-26 (×5): 40 mg via ORAL
  Filled 2011-12-21 (×6): qty 1

## 2011-12-21 MED ORDER — GLIMEPIRIDE 4 MG PO TABS
4.0000 mg | ORAL_TABLET | Freq: Two times a day (BID) | ORAL | Status: DC
  Administered 2011-12-21 – 2011-12-24 (×6): 4 mg via ORAL
  Filled 2011-12-21 (×13): qty 1

## 2011-12-21 MED ORDER — DIPHENHYDRAMINE HCL 12.5 MG/5ML PO ELIX: 12.5000 mg | ORAL_SOLUTION | Freq: Four times a day (QID) | ORAL | Status: DC | PRN

## 2011-12-21 MED ORDER — PHENOL 1.4 % MT LIQD: 1.0000 | OROMUCOSAL | Status: DC | PRN

## 2011-12-21 MED ORDER — ACETAMINOPHEN 325 MG PO TABS
325.0000 mg | ORAL_TABLET | ORAL | Status: DC | PRN
  Administered 2011-12-21: 650 mg via ORAL
  Filled 2011-12-21: qty 2

## 2011-12-21 MED ORDER — METFORMIN HCL 500 MG PO TABS
1000.0000 mg | ORAL_TABLET | Freq: Two times a day (BID) | ORAL | Status: DC
  Administered 2011-12-22 – 2011-12-26 (×3): 1000 mg via ORAL
  Filled 2011-12-21 (×11): qty 2

## 2011-12-21 MED ORDER — ASPIRIN EC 81 MG PO TBEC
81.0000 mg | DELAYED_RELEASE_TABLET | Freq: Every day | ORAL | Status: DC
  Administered 2011-12-21: 81 mg via ORAL
  Filled 2011-12-21 (×2): qty 1

## 2011-12-21 MED ORDER — OXYCODONE-ACETAMINOPHEN 5-325 MG PO TABS
1.0000 | ORAL_TABLET | ORAL | Status: DC | PRN
  Administered 2011-12-21 – 2011-12-26 (×13): 2 via ORAL
  Filled 2011-12-21 (×13): qty 2

## 2011-12-21 MED ORDER — INSULIN ASPART 100 UNIT/ML ~~LOC~~ SOLN: 0.0000 [IU] | Freq: Every day | SUBCUTANEOUS | Status: DC

## 2011-12-21 MED ORDER — ONDANSETRON HCL 4 MG/2ML IJ SOLN
4.0000 mg | Freq: Four times a day (QID) | INTRAMUSCULAR | Status: DC | PRN
  Administered 2011-12-23 (×2): 4 mg via INTRAVENOUS
  Filled 2011-12-21: qty 2

## 2011-12-21 MED ORDER — MORPHINE SULFATE (PF) 1 MG/ML IV SOLN
INTRAVENOUS | Status: DC
  Administered 2011-12-21: 12 mg via INTRAVENOUS
  Administered 2011-12-21: 9.63 mg via INTRAVENOUS
  Administered 2011-12-21: 09:00:00 via INTRAVENOUS
  Administered 2011-12-21: 21 mg via INTRAVENOUS
  Administered 2011-12-22: 13.6 mg via INTRAVENOUS
  Administered 2011-12-22: 17.45 mg via INTRAVENOUS
  Administered 2011-12-22: 22.41 mg via INTRAVENOUS
  Administered 2011-12-23: 09:00:00 via INTRAVENOUS
  Administered 2011-12-23: 25 mg via INTRAVENOUS
  Administered 2011-12-23: 6 mg via INTRAVENOUS
  Filled 2011-12-21 (×7): qty 25

## 2011-12-21 MED ORDER — ONDANSETRON HCL 4 MG/2ML IJ SOLN
INTRAMUSCULAR | Status: AC
  Filled 2011-12-21: qty 2

## 2011-12-21 MED ORDER — HYDROCHLOROTHIAZIDE 25 MG PO TABS
25.0000 mg | ORAL_TABLET | Freq: Every day | ORAL | Status: DC
  Administered 2011-12-21 – 2011-12-26 (×6): 25 mg via ORAL
  Filled 2011-12-21 (×6): qty 1

## 2011-12-21 NOTE — Progress Notes (Signed)
PT Cancellation Note  Treatment cancelled today due to patient's refusal to participate. Attempted evaluation in morning and afternoon, pt not willing to participate. Will attempt tomorrow.  12/21/2011 Milana Kidney DPT PAGER: [phone] OFFICE: [phone]    Milana Kidney 12/21/2011, 3:04 PM

## 2011-12-21 NOTE — Progress Notes (Addendum)
[facility] [place]  Progress Note Bypass Surgery  Date of Surgery: 12/20/2011  Procedure(s): BYPASS GRAFT AXILLA-BIFEMORAL FASCIOTOMY LOWER EXTREMITY ANGIOGRAM Surgeon: Surgeon(s): Fransisco Hertz, MD  1 Day Post-Op  History of Present Illness  Logan [name] is a 66 y.o. male who is S/P Procedure(s): BYPASS GRAFT AXILLA-BIFEMORAL FASCIOTOMY LOWER EXTREMITY ANGIOGRAM bilateral.  The patient's pre-op symptoms of pain and parthesias are Improved . Patients pain is well controlled.    VASC. LAB Studies:       pending   Imaging: Dg Chest 2 View  12/20/2011  *RADIOLOGY REPORT*  Clinical Data: Preoperative examination (axillary bifemoral bypass graft)  CHEST - 2 VIEW  Comparison: 07/25/2004; 10/02/2003  Findings: Grossly unchanged cardiac silhouette and mediastinal contours.  The lungs remain hyperinflated with grossly unchanged bibasilar heterogeneous opacities.  No new focal airspace opacity. No pleural effusion or pneumothorax.  Grossly unchanged bones.  IMPRESSION: Hyperexpanded lungs and chronic bronchitic change without acute cardiopulmonary disease.  Original Report Authenticated By: Waynard Reeds, M.D.   Dg C-arm 1-60 Min-no Report  12/20/2011  CLINICAL DATA: diagnotic to check flow   C-ARM 1-60 MINUTES  Fluoroscopy was utilized by the requesting physician.  No radiographic  interpretation.      Significant Diagnostic Studies: CBC Lab Results  Component Value Date   WBC 12.7* 12/21/2011   HGB 13.3 12/21/2011   HCT 37.8* 12/21/2011   MCV 86.5 12/21/2011   PLT 109* 12/21/2011    BMET     Component Value Date/Time   NA 138 12/21/2011 0430   K 5.1 12/21/2011 0430   CL 107 12/21/2011 0430   CO2 20 12/21/2011 0430   GLUCOSE 254* 12/21/2011 0430   BUN 19 12/21/2011 0430   CREATININE 1.13 12/21/2011 0430   CALCIUM 7.5* 12/21/2011 0430   GFRNONAA 65* 12/21/2011 0430   GFRAA 76* 12/21/2011 0430    COAG Lab Results  Component Value Date   INR 0.97 12/20/2011   No results found  for this basename: PTT    Physical Examination  BP Readings from Last 3 Encounters:  12/21/11 111/59  12/20/11 137/59  12/21/11 111/59   Temp Readings from Last 3 Encounters:  12/21/11 99 F (37.2 C) Oral  12/20/11 98.5 F (36.9 C) Oral  12/21/11 99 F (37.2 C) Oral   SpO2 Readings from Last 3 Encounters:  12/21/11 99%  12/20/11 100%  12/21/11 99%   Pulse Readings from Last 3 Encounters:  12/21/11 103  12/20/11 88  12/21/11 103    Pt is A&O x 3  Femoral : Incision/s is/are clean,dry.intact, and  healing without hematoma, erythema or drainage Limb is warm; with good color  Bilateral doppler monophasic PT pulses weaker on the right. Sensation and decreased active range of motion on the right LE. Right fasciotomy dressing is C/D/I   Assessment/Plan: Pt. Doing well Post-op pain is controlled Wounds are clean, dry, intact or healing well PT/OT for ambulation Continue wound care as ordered  [name] [phone] 12/21/2011 8:47 AM  Addendum  I have independently interviewed and examined the patient, and I agree with the physician assistant's findings.  Pt's perfusion in both limbs is improved from immediate post-op.  I will change the fasciotomy incision bandages a little later to recheck the muscles in the right calf.  Keep in stepdown and watch for development of any ARF, likely due to CIN or myoglobinuria.    Leonides Sake, MD [facility] [place] Office: [phone]  Pager: [phone]  12/21/2011, 10:36 AM

## 2011-12-21 NOTE — Progress Notes (Signed)
Pt transferred from PACU, via stretcher with RN and NT, on monitor. PT VVS. Pt is resting but arousable and oriented. PCA already set up. No family in waiting area. Left pedal pulse dopplered, right no pulses heard. MD aware, findings same as in OR. Will continue to monitor.

## 2011-12-21 NOTE — Progress Notes (Signed)
[facility] [place]  Right leg dressing changed.  Good appearing viable muscle in all compartment including the anterior compartment.  Muscle looks substantially better than before.  Both feet are now warm with intact DF/PF in left foot though some foot drop appears to be present.  Pt unable to move right foot due to pain.  The left calf is soft.  Dopplerable signals in both feet, better than intraoperative.  Not surprisingly CPK is elevated, probably reflects clearance of dead muscle since reestablishment of blood flow to right leg.  UOP remains good.  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

## 2011-12-21 NOTE — Progress Notes (Addendum)
CRITICAL VALUE ALERT  Critical value received:  CKMB 30.8  Date of notification:  12/21/11   Time of notification:  1210  Critical value read back:yes  Nurse who received alert:  Roselie Awkward, RN  MD notified (1st page):  Dr. Imogene Burn   Time of first page:  1233  MD notified (2nd page): Lianne Cure, [place]  Time of second page:1241  [phone] paged Dr. Imogene Burn [phone] paged Dr. Imogene Burn  Responding MD:  Dr. Imogene Burn   Time MD responded:  [phone]

## 2011-12-21 NOTE — Progress Notes (Signed)
ANTIBIOTIC CONSULT NOTE - FOLLOW UP  Pharmacy Consult for Vancomycin Indication: Possible infection of graft  Allergies  Allergen Reactions  . Aspirin Hives  . Penicillins Other (See Comments)    Immune system shut down     Patient Measurements: Height: 5\' 9"  (175.3 cm) Weight: 231 lb 0.7 oz (104.8 kg) IBW/kg (Calculated) : 70.7   Vital Signs: Temp: 99 F (37.2 C) (08/03 0800) Temp src: Oral (08/03 0800) BP: 111/59 mmHg (08/03 0800) Pulse Rate: 103  (08/03 0800) Intake/Output from previous day: 08/02 0701 - 08/03 0700 In: 7720 [I.V.:5750; Blood:970; IV Piggyback:1000] Out: 2825 [Urine:1025; Blood:1800] Intake/Output from this shift: Total I/O In: 125 [I.V.:125] Out: 150 [Urine:150]  Labs:  Basename 12/21/11 0430 12/20/11 1345  WBC 12.7* 12.2*  HGB 13.3 14.2  PLT 109* 172  LABCREA -- --  CREATININE 1.13 1.19   Estimated Creatinine Clearance: 75.6 ml/min (by C-G formula based on Cr of 1.13). No results found for this basename: VANCOTROUGH:2,VANCOPEAK:2,VANCORANDOM:2,GENTTROUGH:2,GENTPEAK:2,GENTRANDOM:2,TOBRATROUGH:2,TOBRAPEAK:2,TOBRARND:2,AMIKACINPEAK:2,AMIKACINTROU:2,AMIKACIN:2, in the last 72 hours   Microbiology: Recent Results (from the past 720 hour(s))  SURGICAL PCR SCREEN     Status: Normal   Collection Time   12/20/11  1:22 PM      Component Value Range Status Comment   MRSA, PCR NEGATIVE  NEGATIVE Final    Staphylococcus aureus NEGATIVE  NEGATIVE Final     Anti-infectives     Start     Dose/Rate Route Frequency Ordered Stop   12/21/11 0400   vancomycin (VANCOCIN) 1,250 mg in sodium chloride 0.9 % 250 mL IVPB        1,250 mg 166.7 mL/hr over 90 Minutes Intravenous Every 12 hours 12/21/11 0137     12/20/11 2345   vancomycin (VANCOCIN) IVPB 1000 mg/200 mL premix  Status:  Discontinued        1,000 mg 200 mL/hr over 60 Minutes Intravenous Every 12 hours 12/20/11 2342 12/20/11 2345   12/20/11 1329   vancomycin (VANCOCIN) 1 GM/200ML IVPB     Comments:  SOMERS, STACY: cabinet override         12/20/11 1329 12/21/11 0129   12/20/11 1325   vancomycin (VANCOCIN) IVPB 1000 mg/200 mL premix  Status:  Discontinued        1,000 mg 200 mL/hr over 60 Minutes Intravenous 60 min pre-op 12/20/11 1325 12/20/11 2351          Assessment: Pt is a 65 YOM who was admitted leg pain and underwent emergency revascularization procedures of the lower extremities. Pharmacy is consulted to dose vancomycin for possible infection of graft. WBC 12.7, Tmax 99.2, SCr 1.13.  Goal of Therapy:  Vancomycin trough level 15-20 mcg/ml  Plan:  1. Decrease vancomycin to 1000mg  IV q12h 2. Vancomycin trough level at steady state 3. Monitor renal function, CBC, WBC 4. Follow clinical progression  Abran Duke, PharmD Clinical Pharmacist Phone: [phone] Pager: [phone] 12/21/2011 9:42 AM

## 2011-12-21 NOTE — Progress Notes (Signed)
CRITICAL VALUE ALERT  Critical value received:  CK total  Date of notification:  12/21/2011  Time of notification:  17:00  Critical value read back:yes  Nurse who received alert:  Jane Canary, RN  MD notified (1st page):  Dr. Imogene Burn  Time of first page:  17:00  MD notified (2nd page):  Time of second page:  Responding MD: Dr. Imogene Burn  Time MD responded:  17:03

## 2011-12-21 NOTE — Progress Notes (Signed)
After notified of CK total value Dr. Imogene Burn came to unit to do a dressing change on right lower leg.  Patient tolerated well, will cont. To monitor.

## 2011-12-22 LAB — MYOGLOBIN, SERUM: Myoglobin: 8179 ng/mL — ABNORMAL HIGH (ref <111)

## 2011-12-22 LAB — CBC
HCT: 34.1 % — ABNORMAL LOW (ref 39.0–52.0)
Hemoglobin: 11.6 g/dL — ABNORMAL LOW (ref 13.0–17.0)
MCH: 30.1 pg (ref 26.0–34.0)
MCHC: 34 g/dL (ref 30.0–36.0)
MCV: 88.3 fL (ref 78.0–100.0)
RBC: 3.86 MIL/uL — ABNORMAL LOW (ref 4.22–5.81)

## 2011-12-22 LAB — GLUCOSE, CAPILLARY
Glucose-Capillary: 58 mg/dL — ABNORMAL LOW (ref 70–99)
Glucose-Capillary: 65 mg/dL — ABNORMAL LOW (ref 70–99)

## 2011-12-22 LAB — BASIC METABOLIC PANEL
BUN: 15 mg/dL (ref 6–23)
CO2: 20 mEq/L (ref 19–32)
Chloride: 101 mEq/L (ref 96–112)
Glucose, Bld: 69 mg/dL — ABNORMAL LOW (ref 70–99)
Potassium: 3.3 mEq/L — ABNORMAL LOW (ref 3.5–5.1)

## 2011-12-22 MED ORDER — DEXTROSE 50 % IV SOLN: 25.0000 mL | Freq: Once | INTRAVENOUS | Status: AC | PRN

## 2011-12-22 MED ORDER — DEXTROSE 50 % IV SOLN: 50.0000 mL | Freq: Once | INTRAVENOUS | Status: AC | PRN

## 2011-12-22 MED ORDER — HEPARIN SODIUM (PORCINE) 5000 UNIT/ML IJ SOLN
5000.0000 [IU] | Freq: Three times a day (TID) | INTRAMUSCULAR | Status: DC
  Administered 2011-12-22 – 2011-12-24 (×5): 5000 [IU] via SUBCUTANEOUS
  Filled 2011-12-22 (×17): qty 1

## 2011-12-22 MED ORDER — CIPROFLOXACIN IN D5W 400 MG/200ML IV SOLN
400.0000 mg | Freq: Two times a day (BID) | INTRAVENOUS | Status: DC
  Administered 2011-12-22 – 2011-12-26 (×9): 400 mg via INTRAVENOUS
  Filled 2011-12-22 (×12): qty 200

## 2011-12-22 NOTE — Progress Notes (Signed)
Hypoglycemic Event  CBG: 58  Treatment: 15 GM carbohydrate snack  Symptoms: None  Follow-up CBG: Time:0755 CBG Result:65  Possible Reasons for Event: Inadequate meal intake  Comments/MD notified:Dr. Chein notified at 0730, ordered to hold AM dose of 1,000 Metformin     Logan [name], [name]  Remember to initiate Hypoglycemia Order Set & complete

## 2011-12-22 NOTE — Evaluation (Signed)
Physical Therapy Evaluation Patient Details Name: WILFORD MERRYFIELD MRN: [id number] DOB: [birth year] Today's Date: 12/22/2011 Time: 4098-1191 PT Time Calculation (min): 24 min  PT Assessment / Plan / Recommendation Clinical Impression  pt rpesents with R LE Fasciotomies, Bil Bifem BPG, and Bil LE Angiograms.  pt refusing to get OOB and only agreeable to sit on EOB today.  pt inconsistently responds to questions and at times needs questions repeated.  pt often grunts as a response to questions.  Unclear PLOF or Home situation.  MAy need to consider SNF at D/C pending family ability to provide care and pt's progress.      PT Assessment  Patient needs continued PT services    Follow Up Recommendations  Home health PT;Supervision/Assistance - 24 hour    Barriers to Discharge  (Unclear at this time.  )      Equipment Recommendations  Rolling walker with 5" wheels    Recommendations for Other Services     Frequency Min 3X/week    Precautions / Restrictions Precautions Precautions: Fall Restrictions Weight Bearing Restrictions: No   Pertinent Vitals/Pain Pt notes R LE 9/10, using PCA.        Mobility  Bed Mobility Bed Mobility: Supine to Sit;Sitting - Scoot to Edge of Bed Supine to Sit: 1: +2 Total assist;HOB elevated;With rails Supine to Sit: Patient Percentage: 50% Sitting - Scoot to Edge of Bed: 3: Mod assist Details for Bed Mobility Assistance: pt moves slowly and insists on HOB elevated.  A with R LE and trunk.   Transfers Transfers: Not assessed Ambulation/Gait Ambulation/Gait Assistance: Not tested (comment) Stairs: No Wheelchair Mobility Wheelchair Mobility: No    Exercises     PT Diagnosis: Acute pain;Generalized weakness  PT Problem List: Decreased strength;Decreased range of motion;Decreased activity tolerance;Decreased balance;Decreased mobility;Decreased cognition;Decreased knowledge of use of DME;Pain;Impaired sensation PT Treatment Interventions: DME  instruction;Gait training;Stair training;Functional mobility training;Therapeutic activities;Therapeutic exercise;Balance training;Patient/family education   PT Goals Acute Rehab PT Goals PT Goal Formulation: With patient Time For Goal Achievement: 01/05/12 Potential to Achieve Goals: Good Pt will go Supine/Side to Sit: Independently PT Goal: Supine/Side to Sit - Progress: Goal set today Pt will go Sit to Supine/Side: Independently PT Goal: Sit to Supine/Side - Progress: Goal set today Pt will go Sit to Stand: with modified independence PT Goal: Sit to Stand - Progress: Goal set today Pt will go Stand to Sit: with modified independence PT Goal: Stand to Sit - Progress: Goal set today Pt will Ambulate: >150 feet;with modified independence;with least restrictive assistive device PT Goal: Ambulate - Progress: Goal set today Pt will Go Up / Down Stairs: Flight;with supervision;with rail(s) PT Goal: Up/Down Stairs - Progress: Goal set today  Visit Information  Last PT Received On: 12/22/11 Assistance Needed: +2    Subjective Data  Subjective: No, I'm not getting up.  pt only agreeable to sit EOB.   Patient Stated Goal: None stated.     Prior Functioning  Home Living Lives With: Spouse Available Help at Discharge:  (pt unclear on level of A at home.  ) Type of Home: House Home Access: Stairs to enter [employer] of Steps:  ("some") Home Layout: Two level;1/2 bath on main level (pt notes he can sleep on couch on main level if needed.  ) Alternate Level Stairs-Number of Steps: flight Additional Comments: pt vague on some answers and just doesn't answer other times.   Prior Function Level of Independence: Independent Able to Take Stairs?: Yes Comments: pt not answering  all questions.   Communication Communication: No difficulties    Cognition  Overall Cognitive Status: Difficult to assess Difficult to assess due to:  (pt doesn't answer some questions, but answers  others.  ) Arousal/Alertness: Awake/alert Behavior During Session: Flat affect Cognition - Other Comments: pt difficult to assess.  At times answers questions appropriately, others doesn't respond or simply says "mmm".      Extremity/Trunk Assessment Right Lower Extremity Assessment RLE ROM/Strength/Tone: Deficits;Unable to fully assess;Due to pain RLE ROM/Strength/Tone Deficits: Not formally assessed secondary to R fasciotomies and edema causing decreased ROM at ankle, knee.  Able to move hip against gravity.   RLE Sensation: Deficits RLE Sensation Deficits: pt indicates numbness in foot, but not assessed secondary to facsiotomies and bandage.   Left Lower Extremity Assessment LLE ROM/Strength/Tone: Deficits LLE ROM/Strength/Tone Deficits: pt indicates mild stiffness, but grossly > 3+/5.   LLE Sensation: Deficits LLE Sensation Deficits: pt indicates numbness in foot   Balance Balance Balance Assessed: No  End of Session PT - End of Session Activity Tolerance: Patient limited by pain Patient left: in bed;with call bell/phone within reach (pt requesting to sit EOB.  ) Nurse Communication: Mobility status  GP     RitenourAlison Murray, PT [phone] 12/22/2011, 10:38 AM

## 2011-12-22 NOTE — Progress Notes (Signed)
Hypoglycemic Event  CBG: 65  Treatment: 15 GM carbohydrate snack  Symptoms: None  Follow-up CBG: Time:0810 CBG Result:75  Possible Reasons for Event: Inadequate meal intake  Comments/MD notified:Dr. Buelah Manis notified at this time     Logan [name]  Remember to initiate Hypoglycemia Order Set & complete

## 2011-12-22 NOTE — Progress Notes (Signed)
  Infectious Disease Pharmacy     Total Antibiotics Day #2         Vancomycin   8/3--->          Ciprofloxacin   8/4--->             Filed Vitals:   12/22/11 0732  BP: 117/49  Pulse: 108  Temp: 97.2 F (36.2 C)  Resp: 22    Lab 12/22/11 0503 12/21/11 0430 12/20/11 1345  NA 133* 138 136  K 3.3* 5.1 --  CL 101 107 100  CO2 20 20 20   GLUCOSE 69* 254* 199*  BUN 15 19 22   CREATININE 1.43* 1.13 1.19  CALCIUM 7.4* 7.5* 9.8  MG -- -- --  PHOS -- -- --    Lab 12/22/11 0503 12/21/11 0430 12/20/11 1345  WBC 11.3* 12.7* 12.2*     Recent Results (from the past 720 hour(s))  SURGICAL PCR SCREEN     Status: Normal   Collection Time   12/20/11  1:22 PM      Component Value Range Status Comment   MRSA, PCR NEGATIVE  NEGATIVE Final    Staphylococcus aureus NEGATIVE  NEGATIVE Final     Assessment: Adding ciprofloxacin to vancomycin for possible graft infection.  Plan:  Ciprofloxacin 400mg  IV q12.  Follow renal function and any cultures.  Celedonio Miyamoto, PharmD, BCPS [phone]

## 2011-12-22 NOTE — Progress Notes (Signed)
[facility]  Daily Progress Note  Assessment/Planning: POD #2 s/p L ABF limb TE, Femfem TE, rev. femfem BPG, R tib TE, R calf fasiotomies, BRo   One spike to 103 deg F: add cipro as precaution, check blood cultures  Creatinine increase not surprised as 200 cc contrast used for intraoperative angio and CPK c/w muscle death.  Good UOP with mobilization of fluid at this point evident by increase in UOP today.  Continue IVF at this point.  Keep foley in place while kidney function is question  R anterior compartment appears viable at this point so I would not proceed with amputation at this point  B feet well perfused at this point  R leg VAC placement tomorrow  OR on Wed to try to close compartment  IS x 10 q1 hr  Subjective  - 2 Days Post-Op  C/o pain but somulent on interview  Objective Filed Vitals:   12/22/11 0000 12/22/11 0300 12/22/11 0400 12/22/11 0732  BP:  100/48  117/49  Pulse:  101  108  Temp:  99 F (37.2 C)  97.2 F (36.2 C)  TempSrc:  Oral  Oral  Resp: 19 15 15 22   Height:      Weight:      SpO2: 97% 98% 98% 99%    Intake/Output Summary (Last 24 hours) at 12/22/11 1014 Last data filed at 12/22/11 0800  Gross per 24 hour  Intake   1937 ml  Output   3425 ml  Net  -1488 ml    PULM  BLL rales CV  RR, tachy GI  soft, NTND VASC  B groin c/d/i, will check right leg dressing later this AM  Laboratory CBC    Component Value Date/Time   WBC 11.3* 12/22/2011 0503   HGB 11.6* 12/22/2011 0503   HCT 34.1* 12/22/2011 0503   PLT 108* 12/22/2011 0503    BMET    Component Value Date/Time   NA 133* 12/22/2011 0503   K 3.3* 12/22/2011 0503   CL 101 12/22/2011 0503   CO2 20 12/22/2011 0503   GLUCOSE 69* 12/22/2011 0503   BUN 15 12/22/2011 0503   CREATININE 1.43* 12/22/2011 0503   CALCIUM 7.4* 12/22/2011 0503   GFRNONAA 49* 12/22/2011 0503   GFRAA 57* 12/22/2011 0503    Leonides Sake, MD [facility] [place] Office:  [phone] Pager: [phone]  12/22/2011, 10:14 AM

## 2011-12-23 ENCOUNTER — Encounter (HOSPITAL_COMMUNITY): Payer: Self-pay | Admitting: Vascular Surgery

## 2011-12-23 LAB — POCT I-STAT 4, (NA,K, GLUC, HGB,HCT)
Glucose, Bld: 149 mg/dL — ABNORMAL HIGH (ref 70–99)
Glucose, Bld: 144 mg/dL — ABNORMAL HIGH (ref 70–99)
HCT: 34 % — ABNORMAL LOW (ref 39.0–52.0)
HCT: 30 % — ABNORMAL LOW (ref 39.0–52.0)
Hemoglobin: 11.6 g/dL — ABNORMAL LOW (ref 13.0–17.0)
Potassium: 3.8 mEq/L (ref 3.5–5.1)
Potassium: 3.6 mEq/L (ref 3.5–5.1)
Sodium: 139 mEq/L (ref 135–145)

## 2011-12-23 LAB — POCT I-STAT 7, (LYTES, BLD GAS, ICA,H+H)
Bicarbonate: 23.3 mEq/L (ref 20.0–24.0)
Calcium, Ion: 1.18 mmol/L (ref 1.13–1.30)
HCT: 30 % — ABNORMAL LOW (ref 39.0–52.0)
Hemoglobin: 10.2 g/dL — ABNORMAL LOW (ref 13.0–17.0)
Potassium: 3.7 mEq/L (ref 3.5–5.1)
Sodium: 139 mEq/L (ref 135–145)

## 2011-12-23 LAB — GLUCOSE, CAPILLARY
Glucose-Capillary: 100 mg/dL — ABNORMAL HIGH (ref 70–99)
Glucose-Capillary: 67 mg/dL — ABNORMAL LOW (ref 70–99)

## 2011-12-23 MED ORDER — NICOTINE 21 MG/24HR TD PT24
21.0000 mg | MEDICATED_PATCH | Freq: Every day | TRANSDERMAL | Status: DC
  Filled 2011-12-23 (×4): qty 1

## 2011-12-23 MED ORDER — POTASSIUM CHLORIDE CRYS ER 20 MEQ PO TBCR
40.0000 meq | EXTENDED_RELEASE_TABLET | Freq: Once | ORAL | Status: AC
  Administered 2011-12-23: 40 meq via ORAL

## 2011-12-23 MED ORDER — VANCOMYCIN HCL 1000 MG IV SOLR
1250.0000 mg | Freq: Two times a day (BID) | INTRAVENOUS | Status: DC
  Administered 2011-12-23 – 2011-12-26 (×6): 1250 mg via INTRAVENOUS
  Filled 2011-12-23 (×8): qty 1250

## 2011-12-23 MED ORDER — POLYETHYLENE GLYCOL 3350 17 G PO PACK
17.0000 g | PACK | Freq: Every day | ORAL | Status: DC
  Administered 2011-12-23 – 2011-12-24 (×2): 17 g via ORAL
  Filled 2011-12-23 (×4): qty 1

## 2011-12-23 MED FILL — Mupirocin Oint 2%: CUTANEOUS | Qty: 22 | Status: AC

## 2011-12-23 NOTE — Progress Notes (Signed)
Patient CBG 59. Apple juice given. Pt was asymptomatic. CBG rechecked 15 minutes later. CBG 67. Ginger ale given and patient was currently eating dinner. Will continue to monitor. Dion Saucier

## 2011-12-23 NOTE — Progress Notes (Signed)
I agree with the following treatment note after reviewing documentation.   Johnston, Lesli Issa Brynn   OTR/L Pager: [phone] Office: [phone] .   

## 2011-12-23 NOTE — Progress Notes (Signed)
Occupational Therapy Evaluation Patient Details Name: Logan [name] MRN: [id number] DOB: 1946/01/01 Today's Date: 12/23/2011 Time: 1191-4782 OT Time Calculation (min): 20 min  OT Assessment / Plan / Recommendation Clinical Impression  66 y.o. pt. presents with R LE Fasciotomies, Bil Bifem BPG, and Bil LE Angiograms. Unclear PLOF or Home situation. Pt. will benefit from OT to maximize independence and safety in ADLs prior to d/c.  May need to consider SNF at D/C.    OT Assessment  Patient needs continued OT Services    Follow Up Recommendations  Skilled nursing facility    Barriers to Discharge Other (comment) (unsure of help at home)    Equipment Recommendations  Other (comment) (TBD)    Recommendations for Other Services    Frequency  Min 2X/week    Precautions / Restrictions Precautions Precautions: Fall Restrictions Weight Bearing Restrictions: No   Pertinent Vitals/Pain Pain 10/10 in RLE.     ADL  Grooming: Performed;Wash/dry face;Set up Where Assessed - Grooming: Unsupported sitting Upper Body Bathing: Simulated;Set up Where Assessed - Upper Body Bathing: Supported sitting Upper Body Dressing: Simulated;Set up Where Assessed - Upper Body Dressing: Supported sitting Toilet Transfer: Simulated;Min A Toilet Transfer Method: Sit to Barista:  Equipment Used: Gait belt;Rolling walker ADL Comments: Pt. in pain during session. Pt. performed simulated toilet transfer with Min A from the chair. Pt. at Setup A for UB dressing/bathing.     OT Diagnosis: Acute pain  OT Problem List: Decreased activity tolerance;Impaired balance (sitting and/or standing);Pain OT Treatment Interventions: Self-care/ADL training;DME and/or AE instruction;Therapeutic activities;Patient/family education;Balance training   OT Goals Acute Rehab OT Goals OT Goal Formulation: With patient Time For Goal Achievement: 01/06/12 Potential to Achieve Goals: Good ADL Goals Pt Will  Perform Grooming: with set-up;Standing at sink ADL Goal: Grooming - Progress: Goal set today Pt Will Perform Lower Body Bathing: Sit to stand from chair;with set-up ADL Goal: Lower Body Bathing - Progress: Goal set today Pt Will Perform Lower Body Dressing: with set-up;Sit to stand from chair;Sit to stand from bed ADL Goal: Lower Body Dressing - Progress: Goal set today Pt Will Transfer to Toilet: with supervision;Ambulation ADL Goal: Toilet Transfer - Progress: Goal set today Pt Will Perform Toileting - Clothing Manipulation: with supervision;Standing ADL Goal: Toileting - Clothing Manipulation - Progress: Goal set today Pt Will Perform Toileting - Hygiene: with supervision;Sit to stand from 3-in-1/toilet ADL Goal: Toileting - Hygiene - Progress: Goal set today  Visit Information  Last OT Received On: 12/23/11 Assistance Needed: +2 PT/OT Co-Evaluation/Treatment: Yes    Subjective Data    Pt. Did not speak much in session and was in pain.    Prior Functioning  Vision/Perception  Home Living Lives With: Spouse Available Help at Discharge: Other (Comment) (pt unclear on level of A at home) Type of Home: House Home Access: Stairs to enter Home Layout: Two level;1/2 bath on main level Alternate Level Stairs-Number of Steps: flight Additional Comments: pt vague on some answers and just doesn't answer other times.   Prior Function Level of Independence: Independent Able to Take Stairs?: Yes Communication Communication: No difficulties      Cognition  Overall Cognitive Status: Appears within functional limits for tasks assessed/performed Arousal/Alertness: Awake/alert Orientation Level: Appears intact for tasks assessed Behavior During Session: Flat affect Cognition - Other Comments: pt difficult to assess.  At times answers questions appropriately, others doesn't respond or simply says "mmm".      Extremity/Trunk Assessment Right Upper Extremity Assessment RUE  ROM/Strength/Tone:  WFL for tasks assessed Left Upper Extremity Assessment LUE ROM/Strength/Tone: WFL for tasks assessed   Mobility Bed Mobility Bed Mobility: Not assessed Transfers Transfers: Sit to Stand;Stand to Sit Sit to Stand: 1: +2 Total assist;With upper extremity assist;From bed Sit to Stand: Patient Percentage: 60% Stand to Sit: 4: Min assist;With upper extremity assist;To bed;To chair/3-in-1 Details for Transfer Assistance: vc's for hand placement/ safety         End of Session OT - End of Session Activity Tolerance: Patient limited by pain Patient left: in bed;with call bell/phone within reach  GO     Jenell Milliner 12/23/2011, 3:56 PM

## 2011-12-23 NOTE — Progress Notes (Addendum)
[facility] [place]  Progress Note Bypass Surgery  Date of Surgery: 12/20/2011  Procedure(s): BYPASS GRAFT AXILLA-BIFEMORAL FASCIOTOMY LOWER EXTREMITY ANGIOGRAM Surgeon: Surgeon(s): Fransisco Hertz, MD  3 Days Post-Op  History of Present Illness  Logan [name] is a 66 y.o. male who is S/P Procedure(s): BYPASS GRAFT AXILLA-BIFEMORAL FASCIOTOMY LOWER EXTREMITY ANGIOGRAM bilateral.  The patient's pre-op symptoms of pain are Improved . Patients pain is well controlled.       Imaging: No results found.  Significant Diagnostic Studies: CBC Lab Results  Component Value Date   WBC 11.3* 12/22/2011   HGB 11.6* 12/22/2011   HCT 34.1* 12/22/2011   MCV 88.3 12/22/2011   PLT 108* 12/22/2011    BMET     Component Value Date/Time   NA 133* 12/22/2011 0503   K 3.3* 12/22/2011 0503   CL 101 12/22/2011 0503   CO2 20 12/22/2011 0503   GLUCOSE 69* 12/22/2011 0503   BUN 15 12/22/2011 0503   CREATININE 1.43* 12/22/2011 0503   CALCIUM 7.4* 12/22/2011 0503   GFRNONAA 49* 12/22/2011 0503   GFRAA 57* 12/22/2011 0503    COAG Lab Results  Component Value Date   INR 0.97 12/20/2011   No results found for this basename: PTT    Physical Examination  BP Readings from Last 3 Encounters:  12/23/11 113/56  12/20/11 137/59  12/23/11 113/56   Temp Readings from Last 3 Encounters:  12/23/11 98.3 F (36.8 C) Oral  12/20/11 98.5 F (36.9 C) Oral  12/23/11 98.3 F (36.8 C) Oral   SpO2 Readings from Last 3 Encounters:  12/23/11 95%  12/20/11 100%  12/23/11 95%   Pulse Readings from Last 3 Encounters:  12/23/11 94  12/20/11 88  12/23/11 94    Pt is A&O x 3 Bilateral  lower extremity: Incision/s is/are clean,dry.intact, and  healing without hematoma, erythema or drainage Right fasciotomy incisional dressing clean and dry Limb is warm; with good color PT pulses intact bilateral monophasic via doppler. AROM of both feet and ankle grossly intact with improvement on the right from  Friday.     Assessment/Plan: Pt. Doing well Post-op pain is controlled Wounds are healing well PT/OT for ambulation Continue wound care as ordered plan wound vac placement today.  Clinton Gallant [facility] [phone] 12/23/2011 8:48 AM  Addendum  I have independently interviewed and examined the patient, and I agree with the physician assistant's findings.  Having some issues with nausea.  +BS, no bowel movements recently.  Prn constipation agents already available.  Pt continue to void well with good volume.  Recheck BMP tomorrow.  Pain better control.  Transition to PO pain rx.  VAC today to right leg.  Then trsf to 2000.  OR Wednesday for fasciotomy closure.  Leonides Sake, MD [facility] [place] Office: [phone] Pager: [phone]  12/23/2011, 9:28 AM

## 2011-12-23 NOTE — Progress Notes (Signed)
ANTIBIOTIC CONSULT NOTE - FOLLOW UP  Pharmacy Consult for vancomycin Indication: possible graft infection  Labs:  Basename 12/22/11 0503 12/21/11 0430 12/20/11 1345  WBC 11.3* 12.7* 12.2*  HGB 11.6* 13.3 14.2  PLT 108* 109* 172  LABCREA -- -- --  CREATININE 1.43* 1.13 1.19   Estimated Creatinine Clearance: 59.8 ml/min (by C-G formula based on Cr of 1.43).  Basename 12/23/11 0325  VANCOTROUGH 14.7  VANCOPEAK --  VANCORANDOM --  GENTTROUGH --  GENTPEAK --  GENTRANDOM --  TOBRATROUGH --  TOBRAPEAK --  TOBRARND --  AMIKACINPEAK --  AMIKACINTROU --  AMIKACIN --     Assessment: 66yo male on IV ABX for possible graft infection slightly subtherapeutic on vancomycin with initial dosing.  Last dose was hung a little late and trough drawn prior to next dose due so true trough slightly lower.  Goal of Therapy:  Vancomycin trough level 15-20 mcg/ml  Plan:  Will increase vancomycin to 1250mg  IV Q12H for calculated trough of ~18 and continue to monitor.  Colleen Can PharmD BCPS 12/23/2011,4:15 AM

## 2011-12-23 NOTE — Progress Notes (Signed)
Pt transferred to 2000 after wound vac placement. VSS, family at bedside.

## 2011-12-23 NOTE — Progress Notes (Signed)
Inpatient Diabetes Program Recommendations  AACE/ADA: New Consensus Statement on Inpatient Glycemic Control (2013)  Target Ranges:  Prepandial:   less than 140 mg/dL      Peak postprandial:   less than 180 mg/dL (1-2 hours)      Critically ill patients:  140 - 180 mg/dL   Reason for Visit: Hypoglycemia and Elevated HgbA1C  Logan [name] is a 66 y.o. (Mar 01, 1946) male, s/p multiple revascularization procedures including Aobifem BPG, multiple fem-fem, and L fem-pop, who presents with chief complaint: Bilateral leg pain. Pain moderate to severe, constant, and has worsened since 2 weeks ago. Pt has had claudication for multiple years near. He now has BLE rest pain with mildly decrease in sensation in both feets.  On Lantus 60 units in am and 40 units QHS. States he checks blood sugars daily.  Does not take rapid acting insulin at home "because I don't to."    Results for [name], [name] (MRN [id number]) as of 12/23/2011 17:38  Ref. Range 12/23/2011 07:17 12/23/2011 11:22 12/23/2011 16:46 12/23/2011 17:06  Glucose-Capillary Latest Range: 70-99 mg/dL 409 (H) 811 (H) 59 (L) 67 (L)   Results for [name], [name] (MRN [id number]) as of 12/23/2011 17:38  Ref. Range 12/21/2011 02:00  Hemoglobin A1C Latest Range: <5.7 % 9.7 (H)   Encouraged him to followup with PCP regarding elevated HgbA1C.  Was not interested in discussing diabetes.  Will follow up.  No new recommendations at this time.

## 2011-12-23 NOTE — Progress Notes (Signed)
Utilization review completed.  

## 2011-12-23 NOTE — Consult Note (Signed)
WOC consult Note Reason for Consult: Consult requested for right leg fasciotomy sites X2 Wound type: Full thickness wounds to inner and outer right leg. Measurement:Medial 33X5.5X2.5cm Lateral 28.5X4X1cm Wound bed: Both sites beefy red with exposed muscles and tendons. Areas very painful. Drainage (amount, consistency, odor)  Scant pink drainage. Periwound: intact skin surrounding. Dressing procedure/placement/frequency: Bedside nurse has applied vac dressings to each site earlier this afternoon.  One piece Mepitel to each site and one piece black sponge to cont suction.  Pt tolerated with mod discomfort and pain meds given.  Plans to possibly go to the OR for closure on Wed, according to progress notes.  Will plan dressing change Wed if does not go to OR.  Cammie Mcgee, RN, MSN, [employer]  [phone]

## 2011-12-24 LAB — TYPE AND SCREEN
ABO/RH(D): A POS
Unit division: 0
Unit division: 0

## 2011-12-24 LAB — CBC
Platelets: 165 10*3/uL (ref 150–400)
RDW: 13.9 % (ref 11.5–15.5)
WBC: 11.6 10*3/uL — ABNORMAL HIGH (ref 4.0–10.5)

## 2011-12-24 LAB — GLUCOSE, CAPILLARY
Glucose-Capillary: 67 mg/dL — ABNORMAL LOW (ref 70–99)
Glucose-Capillary: 113 mg/dL — ABNORMAL HIGH (ref 70–99)
Glucose-Capillary: 76 mg/dL (ref 70–99)

## 2011-12-24 LAB — BASIC METABOLIC PANEL
Calcium: 8.7 mg/dL (ref 8.4–10.5)
Chloride: 104 mEq/L (ref 96–112)
Creatinine, Ser: 1.21 mg/dL (ref 0.50–1.35)
GFR calc Af Amer: 70 mL/min — ABNORMAL LOW (ref >90)

## 2011-12-24 MED ORDER — DEXTROSE-NACL 5-0.45 % IV SOLN
INTRAVENOUS | Status: DC
  Administered 2011-12-24 – 2011-12-25 (×2): via INTRAVENOUS
  Administered 2011-12-25: 125 mL/h via INTRAVENOUS
  Administered 2011-12-26: 05:00:00 via INTRAVENOUS

## 2011-12-24 MED ORDER — POTASSIUM CHLORIDE CRYS ER 20 MEQ PO TBCR
20.0000 meq | EXTENDED_RELEASE_TABLET | Freq: Once | ORAL | Status: AC
  Administered 2011-12-24: 20 meq via ORAL
  Filled 2011-12-24: qty 1

## 2011-12-24 MED ORDER — GLUCOSE 40 % PO GEL
ORAL | Status: AC
  Administered 2011-12-24: 37.5 g
  Filled 2011-12-24: qty 1

## 2011-12-24 MED ORDER — GLUCOSE 40 % PO GEL
ORAL | Status: AC
  Filled 2011-12-24: qty 1

## 2011-12-24 MED FILL — Sodium Chloride Irrigation Soln 0.9%: Qty: 3000 | Status: AC

## 2011-12-24 MED FILL — Sodium Chloride IV Soln 0.9%: INTRAVENOUS | Qty: 1000 | Status: AC

## 2011-12-24 NOTE — Care Management Note (Unsigned)
    Page 1 of 1   12/24/2011     10:56:46 AM   CARE MANAGEMENT NOTE 12/24/2011  Patient:  CAELUM, FEDERICI   Account Number:  [id number]  Date Initiated:  12/24/2011  Documentation initiated by:  SIMMONS,Alexys Gassett  Subjective/Objective Assessment:   ADMITTED WITH PAD; LIVES AT HOME WITH WIFE- JESSIE; WAS IPTA; USES MAIL ORDER AND [facility] IN [place] FOR RX; MAY NEED RW AT DISCHARGE.     Action/Plan:   DISCHARGE PLANNING DISCUSSED AT BEDSIDE.   Anticipated DC Date:  12/26/2011   Anticipated DC Plan:  HOME W HOME HEALTH SERVICES      DC Planning Services  CM consult      Choice offered to / List presented to:             Status of service:  In process, will continue to follow Medicare Important Message given?   (If response is "NO", the following Medicare IM given date fields will be blank) Date Medicare IM given:   Date Additional Medicare IM given:    Discharge Disposition:    Per UR Regulation:  Reviewed for med. necessity/level of care/duration of stay  If discussed at Long Length of Stay Meetings, dates discussed:    Comments:  12/24/11  1055  Depaul Arizpe SIMMONS RN, BSN [phone] TO OR TOMORROW 12/25/11 FOR WOUND CLOSURE; NCM WILL FOLLOW.

## 2011-12-24 NOTE — Progress Notes (Signed)
Patient's CBG 67 before eating lunch. Apple juice given to patient. Patient then ate 100% of his meal. CBG rechecked and was 55. 8 oz of orange juice administered. PA notified and MD notified. Dr. Edilia Bo wrote order for D5 1/2 NS at 125 for maintenance fluids. CBG rechecked, 73. Will continue to monitor patient. Eartha Inch Taylor]

## 2011-12-24 NOTE — Progress Notes (Signed)
Inpatient Diabetes Program Recommendations  AACE/ADA: New Consensus Statement on Inpatient Glycemic Control (2013)  Target Ranges:  Prepandial:   less than 140 mg/dL      Peak postprandial:   less than 180 mg/dL (1-2 hours)      Critically ill patients:  140 - 180 mg/dL   Reason for Visit: Hypoglycemia   Results for EINER, MEALS (MRN [id number]) as of 12/24/2011 12:49  Ref. Range 12/23/2011 17:06 12/23/2011 20:46 12/24/2011 05:30 12/24/2011 06:19 12/24/2011 11:10  Glucose-Capillary Latest Range: 70-99 mg/dL 67 (L) 64 (L) 62 (L) 409 (H) 67 (L)   Results for SERAJ, DUNNAM (MRN [id number]) as of 12/24/2011 12:49  Ref. Range 12/22/2011 05:03 12/24/2011 05:50  Glucose Latest Range: 70-99 mg/dL 69 (L) 59 (L)   Recommendations:  Decrease Lantus to 30 units in am and 45 units QHS.    Will continue to follow.

## 2011-12-24 NOTE — Progress Notes (Signed)
Patient evaluated for long-term disease management services with [facility] Care Management Program as a benefit of his [employer]. Patient will receive a post discharge transition of care call and monthly home visits for assessments and for education. [name] states he will go home with home health services. States he is not sure which one. Nevertheless, explained that [facility] Care Management will not visit until after home health has ended. [facility] Care Management services do not duplicate, replace, or interfere with what is arranged by inpatient case manager. Consents signed at bedside. Appreciative of visit. Made inpatient RNCM aware of visit as well.       Logan Noble, MSN- Ed, RN,BSN [facility] Liaison [phone]

## 2011-12-24 NOTE — Plan of Care (Signed)
Problem: Phase III Progression Outcomes Goal: Tolerating progressive ambulation/activity Outcome: Not Progressing Pt still working with PT. Good mobility while in bed. Goal: Wounds healing without signs/symptoms of infection Outcome: Progressing Wound vac intact. No s/s of infection. Goal: Initiating ADLs with minimal assistance Outcome: Not Progressing Continues to need minimum assist with ADL's. Goal: O2 Sats > or equal to 90% on room air Outcome: Completed/Met Date Met:  12/24/11 Denies sob or trouble breathing. No signs or labored breathing or use of accessory muscles. Goal: Pain controlled on oral analgesia Outcome: Progressing Denies pain. Goal: Tolerating diet Outcome: Not Progressing Intermittent nausea and vomiting relieved by Zofran. Goal: If Diabetic, blood sugar < 150 Outcome: Completed/Met Date Met:  12/24/11 Blood sugar<100

## 2011-12-24 NOTE — Progress Notes (Signed)
New canister applied to wound vac. 350 cc of output from previous canister. Logan [name]

## 2011-12-24 NOTE — Progress Notes (Addendum)
[facility] Progress Note  12/24/2011 9:45 AM POD 4  Subjective:  No complaints  Tm 99.3  Otherwise VSS Filed Vitals:   12/24/11 0522  BP: 130/59  Pulse: 92  Temp: 99.3 F (37.4 C)  Resp: 18    Physical Exam: Incisions:  Right groin incision is moist, but incision is in tact. Extremities:  RLE with medial and lateral wound vac in place.  Bilateral feet are warm. Abdomen:  Soft; NT;ND  +BS; +BM Cardiac:  RRR Lungs:  CTAB  CBC    Component Value Date/Time   WBC 11.6* 12/24/2011 0550   RBC 4.19* 12/24/2011 0550   HGB 12.5* 12/24/2011 0550   HCT 36.6* 12/24/2011 0550   PLT 165 12/24/2011 0550   MCV 87.4 12/24/2011 0550   MCH 29.8 12/24/2011 0550   MCHC 34.2 12/24/2011 0550   RDW 13.9 12/24/2011 0550    BMET    Component Value Date/Time   NA 137 12/24/2011 0550   K 3.6 12/24/2011 0550   CL 104 12/24/2011 0550   CO2 22 12/24/2011 0550   GLUCOSE 59* 12/24/2011 0550   BUN 7 12/24/2011 0550   CREATININE 1.21 12/24/2011 0550   CALCIUM 8.7 12/24/2011 0550   GFRNONAA 60* 12/24/2011 0550   GFRAA 70* 12/24/2011 0550    INR    Component Value Date/Time   INR 0.97 12/20/2011 1345   Blood cx from 12/22/11:  NGTD   Intake/Output Summary (Last 24 hours) at 12/24/11 0945 Last data filed at 12/24/11 0857  Gross per 24 hour  Intake      0 ml  Output   4575 ml  Net  -4575 ml     Assessment/Plan:  66 y.o. male is s/p:  1. Left aortobifemoral limb thrombectomy 2. Femorofemoral graft thrombectomy 3. Revision of femorofemoral graft with interposition graft 4. Right tibial thrombectomy 5. Right four-compartment fasciotomies 6. Bilateral leg runoff   POD 4  -creatinine down to 1.21 today from 1.43. -WBC still elevated, but stable at 11.6 -for OR tomorrow for fasciotomy closure -acute surgical blood loss anemia is improving -labs tomorrow am -will place dry gauze to right groin daily to try to prevent breakdown. -get consent for fasciotomy closures -NPO after MN -pt on Vanc - no ABx  ordered for on call to OR  Doreatha Massed, PA-C [facility] [phone] 12/24/2011 9:45 AM  Addendum  I have independently interviewed and examined the patient, and I agree with the physician assistant's findings.  Kidney function recovering from dead muscle and contrast exposure.  R calf fasciotomy closure w/ possible VAC placement if not able to close medial incision tomorrow.  SNF placement after that is completed.  Leonides Sake, MD [facility] [place] Office: [phone] Pager: [phone]  12/24/2011, 11:44 AM

## 2011-12-24 NOTE — Consult Note (Signed)
Wound care follow-up:  Pt scheduled for fasciotomy closure in OR tomorrow.  Will not plan to follow further unless re consulted. Will not plan to follow further unless re-consulted.  [address] RN, MSN, [employer]  [phone]

## 2011-12-24 NOTE — Progress Notes (Signed)
Dry dressing applied to right groin per MD order.  Shatha Hooser, [place], RN

## 2011-12-25 ENCOUNTER — Encounter (HOSPITAL_COMMUNITY): Payer: Self-pay | Admitting: Anesthesiology

## 2011-12-25 ENCOUNTER — Encounter (HOSPITAL_COMMUNITY)
Admission: RE | Disposition: A | Discharge status: Skilled Nursing Facility | Payer: Self-pay | Source: Ambulatory Visit | Attending: Vascular Surgery

## 2011-12-25 ENCOUNTER — Inpatient Hospital Stay (HOSPITAL_COMMUNITY): Payer: Medicare Other | Admitting: Anesthesiology

## 2011-12-25 DIAGNOSIS — I70219 Atherosclerosis of native arteries of extremities with intermittent claudication, unspecified extremity: Secondary | ICD-10-CM

## 2011-12-25 HISTORY — PX: DEBRIDEMENT LEG: SUR390

## 2011-12-25 LAB — GLUCOSE, CAPILLARY
Glucose-Capillary: 71 mg/dL (ref 70–99)
Glucose-Capillary: 52 mg/dL — ABNORMAL LOW (ref 70–99)
Glucose-Capillary: 157 mg/dL — ABNORMAL HIGH (ref 70–99)
Glucose-Capillary: 92 mg/dL (ref 70–99)

## 2011-12-25 SURGERY — FASCIOTOMY CLOSURE
Anesthesia: General | Site: Leg Lower | Laterality: Right

## 2011-12-25 MED ORDER — HYDROMORPHONE HCL PF 1 MG/ML IJ SOLN
INTRAMUSCULAR | Status: AC
  Filled 2011-12-25: qty 1

## 2011-12-25 MED ORDER — PROPOFOL 10 MG/ML IV EMUL
INTRAVENOUS | Status: DC | PRN
  Administered 2011-12-25: 200 mg via INTRAVENOUS

## 2011-12-25 MED ORDER — HYDROMORPHONE HCL PF 1 MG/ML IJ SOLN: 0.5000 mg | INTRAMUSCULAR | Status: DC | PRN

## 2011-12-25 MED ORDER — HYDROMORPHONE HCL PF 1 MG/ML IJ SOLN
INTRAMUSCULAR | Status: AC
  Administered 2011-12-25: 0.5 mg
  Filled 2011-12-25: qty 1

## 2011-12-25 MED ORDER — HYDROMORPHONE HCL PF 1 MG/ML IJ SOLN: 0.2500 mg | INTRAMUSCULAR | Status: DC | PRN

## 2011-12-25 MED ORDER — LIDOCAINE HCL (CARDIAC) 20 MG/ML IV SOLN
INTRAVENOUS | Status: DC | PRN
  Administered 2011-12-25: 100 mg via INTRAVENOUS

## 2011-12-25 MED ORDER — DEXTROSE 50 % IV SOLN
INTRAVENOUS | Status: AC
  Filled 2011-12-25: qty 50

## 2011-12-25 MED ORDER — 0.9 % SODIUM CHLORIDE (POUR BTL) OPTIME
TOPICAL | Status: DC | PRN
  Administered 2011-12-25: 1000 mL

## 2011-12-25 MED ORDER — DEXTROSE 50 % IV SOLN
50.0000 mL | Freq: Once | INTRAVENOUS | Status: AC
  Administered 2011-12-25: 50 mL via INTRAVENOUS

## 2011-12-25 MED ORDER — ONDANSETRON HCL 4 MG/2ML IJ SOLN
INTRAMUSCULAR | Status: DC | PRN
  Administered 2011-12-25: 4 mg via INTRAVENOUS

## 2011-12-25 MED ORDER — HYDROMORPHONE HCL PF 1 MG/ML IJ SOLN
0.2500 mg | INTRAMUSCULAR | Status: DC | PRN
  Administered 2011-12-25 (×3): 0.5 mg via INTRAVENOUS

## 2011-12-25 MED ORDER — LACTATED RINGERS IV SOLN
INTRAVENOUS | Status: DC | PRN
  Administered 2011-12-25: 13:00:00 via INTRAVENOUS

## 2011-12-25 MED ORDER — FENTANYL CITRATE 0.05 MG/ML IJ SOLN
INTRAMUSCULAR | Status: DC | PRN
  Administered 2011-12-25 (×3): 50 ug via INTRAVENOUS
  Administered 2011-12-25: 100 ug via INTRAVENOUS

## 2011-12-25 SURGICAL SUPPLY — 39 items
BAG ISL DRAPE 18X18 STRL (DRAPES) ×1
BAG ISOLATION DRAPE 18X18 (DRAPES) IMPLANT
BANDAGE ELASTIC 4 VELCRO ST LF (GAUZE/BANDAGES/DRESSINGS) ×1 IMPLANT
BANDAGE ELASTIC 6 VELCRO ST LF (GAUZE/BANDAGES/DRESSINGS) ×1 IMPLANT
BANDAGE GAUZE ELAST BULKY 4 IN (GAUZE/BANDAGES/DRESSINGS) ×1 IMPLANT
CANISTER SUCTION 2500CC (MISCELLANEOUS) ×2 IMPLANT
CANISTER WOUND CARE 500ML ATS (WOUND CARE) ×1 IMPLANT
CLOTH BEACON ORANGE TIMEOUT ST (SAFETY) ×2 IMPLANT
COVER SURGICAL LIGHT HANDLE (MISCELLANEOUS) ×2 IMPLANT
DRAPE INCISE IOBAN 66X45 STRL (DRAPES) IMPLANT
DRAPE ISOLATION BAG 18X18 (DRAPES) ×1
DRSG VAC ATS MED SENSATRAC (GAUZE/BANDAGES/DRESSINGS) ×2 IMPLANT
ELECT REM PT RETURN 9FT ADLT (ELECTROSURGICAL) ×2
ELECTRODE REM PT RTRN 9FT ADLT (ELECTROSURGICAL) ×1 IMPLANT
GLOVE BIO SURGEON STRL SZ 6.5 (GLOVE) ×1 IMPLANT
GLOVE BIO SURGEON STRL SZ7 (GLOVE) ×2 IMPLANT
GLOVE BIOGEL PI IND STRL 7.0 (GLOVE) IMPLANT
GLOVE BIOGEL PI IND STRL 7.5 (GLOVE) ×1 IMPLANT
GLOVE BIOGEL PI INDICATOR 7.0 (GLOVE) ×1
GLOVE BIOGEL PI INDICATOR 7.5 (GLOVE) ×1
GOWN STRL NON-REIN LRG LVL3 (GOWN DISPOSABLE) ×4 IMPLANT
KIT BASIN OR (CUSTOM PROCEDURE TRAY) ×2 IMPLANT
KIT ROOM TURNOVER OR (KITS) ×2 IMPLANT
NS IRRIG 1000ML POUR BTL (IV SOLUTION) ×2 IMPLANT
PACK GENERAL/GYN (CUSTOM PROCEDURE TRAY) ×2 IMPLANT
PACK UNIVERSAL I (CUSTOM PROCEDURE TRAY) ×2 IMPLANT
PAD ARMBOARD 7.5X6 YLW CONV (MISCELLANEOUS) ×4 IMPLANT
SPONGE GAUZE 4X4 12PLY (GAUZE/BANDAGES/DRESSINGS) ×3 IMPLANT
STAPLER VISISTAT 35W (STAPLE) ×2 IMPLANT
SUT ETHILON 2 0 PSLX (SUTURE) ×4 IMPLANT
SUT ETHILON 3 0 PS 1 (SUTURE) IMPLANT
SUT MNCRL AB 4-0 PS2 18 (SUTURE) IMPLANT
SUT VIC AB 2-0 CTX 36 (SUTURE) IMPLANT
SUT VIC AB 3-0 SH 27 (SUTURE)
SUT VIC AB 3-0 SH 27X BRD (SUTURE) IMPLANT
TAPE CLOTH SURG 4X10 WHT LF (GAUZE/BANDAGES/DRESSINGS) ×1 IMPLANT
TOWEL OR 17X24 6PK STRL BLUE (TOWEL DISPOSABLE) ×2 IMPLANT
TOWEL OR 17X26 10 PK STRL BLUE (TOWEL DISPOSABLE) ×2 IMPLANT
WATER STERILE IRR 1000ML POUR (IV SOLUTION) ×2 IMPLANT

## 2011-12-25 NOTE — Progress Notes (Addendum)
PA notified of low CBGs this AM. Was told to hold all oral diabetes medications & lantus.  Patients BP was 138/72 and HR 89- PA confirmed it was ok to give his BP medications this AM.  Logan [name], Logan [name]

## 2011-12-25 NOTE — Progress Notes (Signed)
PT Cancellation Note  Treatment cancelled today due to patient's refusal to participate.  Logan [name] 12/25/2011, 11:42 AM

## 2011-12-25 NOTE — Op Note (Addendum)
OPERATIVE NOTE   PROCEDURE:  1. Closure of right medial calf fasciotomy incision (30 cm) 2. Partial closure of right lateral calf fasciotomy (5 cm) 3. Placement of negative pressure dressing  PRE-OPERATIVE DIAGNOSIS: Right calf fasciotomy incisions   POST-OPERATIVE DIAGNOSIS: same as above   SURGEON: Leonides Sake, MD   ASSISTANT(S): Doreatha Massed, PAC   ANESTHESIA: general   ESTIMATED BLOOD LOSS: 30 cc   FINDING(S):  1. Viable muscle in all compartments but edema throughout 2. Left medial incision closed 3. Left lateral incision partial closed  SPECIMEN(S): none   INDICATIONS:  Logan [name] is a 66 y.o. male who presents with s/p R calf fasciotomies done as part of right leg revascularization. The patient returns to operating room today for attempt at closure of incision. The patient is aware the risks include but are not limited to: bleeding, incision, inability to close the incision, and need for further procedures .   DESCRIPTION:  After obtain full informed written consent, the patient was brought back to the operating room and placed supine upon the operating table. The patient received IV antibiotics prior to induction. After obtaining adequate anesthesia, the patient was prepped and draped in the standard fashion for: right leg fasciotomy incision closure. I turned my attention to the medial incision. I tested all muscles with electrocautery and all muscles contracted to stimulation. There was significant amount of edema throughout. Using multiple horizontal mattress sutures of 2-0 Nylon and staples between the sutures, 30 cm of medial incision was reapproximated, closing the medial incision. I turned my attention to the lateral incision. I tested all muscles with electrocautery and all muscles contracted to stimulation. There was significant amount of edema throughout. Using multiple horizontal mattress sutures of 2-0 Nylon and staples between the sutures, only 5 cm of lateral  incision was reapproximated. I then fashioned a VAC sponge to meet the geometry of the residual incision on the lateral surface. The sponge was affixed with adhesive sheets and then a hole was cut into the bandage. The lilypad was attached to the bandage and the VAC circuit connected to 125 of suction from the VAC pump. The VAC sponge was adherent to the left calf wound bed without any leaks. Sterile dressings were applied to the medial incision and affixed with sterile tape. Sterile dressings were applied to both groins and affixed with sterile tape.  I doubt his lateral fasciotomy will close anytime soon, so his VAC dressing will continue for the next week.  If his incision fails to shrink enough in the next two weeks to close, I will have him referred to plastic surgery for skin graft placement.    COMPLICATIONS: none   CONDITION: stable   Leonides Sake, MD  [facility] [place]  Office: [phone]  Pager: [phone]  12/25/2011, 2:40 PM

## 2011-12-25 NOTE — H&P (Signed)
[facility] [place]  History and Physical Update  The patient was interviewed and re-examined.  The patient's previous History and Physical has been reviewed and is unchanged except for: interval thrombectomies and R calf fasciotomies.  There is no change in the plan of care: attempted closure of fasciotomies.  Leonides Sake, MD [facility] [place] Office: [phone] Pager: [phone]  12/25/2011, 7:45 AM

## 2011-12-25 NOTE — Progress Notes (Signed)
pts bs=52 pt given regular sprite/peanut buteer crackers

## 2011-12-25 NOTE — Preoperative (Signed)
Beta Blockers   Reason not to administer Beta Blockers:Metoprolol 12/24/11 2230

## 2011-12-25 NOTE — Anesthesia Postprocedure Evaluation (Signed)
  Anesthesia Post-op Note  Patient: Logan [name]  Procedure(s) Performed: Procedure(s) (LRB): FASCIOTOMY CLOSURE (Right)  Patient Location: PACU  Anesthesia Type: General  Level of Consciousness: awake, alert  and oriented  Airway and Oxygen Therapy: Patient Spontanous Breathing and Patient connected to nasal cannula oxygen  Post-op Pain: mild  Post-op Assessment: Post-op Vital signs reviewed, Patient's Cardiovascular Status Stable, Respiratory Function Stable, Patent Airway, No signs of Nausea or vomiting and Pain level controlled  Post-op Vital Signs: Reviewed and stable  Complications: No apparent anesthesia complications

## 2011-12-25 NOTE — Progress Notes (Signed)
cbg 4    Dr Jean Rosenthal aware.  Order received to give 1 amp D50

## 2011-12-25 NOTE — Progress Notes (Signed)
CSW spoke to Logan [name] and wife, Pam.  Per Pam's req searching for SNF in [facility].  Once bed offers have been received, CSW will relay to Logan [name] and family for decision. Vickii Penna, LCSWA [phone]  Clinical Social Work

## 2011-12-25 NOTE — Progress Notes (Signed)
Report given to rebecca rn as caregiver 

## 2011-12-25 NOTE — Progress Notes (Signed)
Dr Jean Rosenthal here to see pt  Informed of pts bs will recheck in 15 minutes asnd notify her of results

## 2011-12-25 NOTE — Transfer of Care (Signed)
Immediate Anesthesia Transfer of Care Note  Patient: Logan [name]  Procedure(s) Performed: Procedure(s) (LRB): FASCIOTOMY CLOSURE (Right)  Patient Location: PACU  Anesthesia Type: General  Level of Consciousness: awake, alert , oriented and sedated  Airway & Oxygen Therapy: Patient Spontanous Breathing and Patient connected to nasal cannula oxygen  Post-op Assessment: Report given to PACU RN, Post -op Vital signs reviewed and stable and Patient moving all extremities  Post vital signs: Reviewed and stable  Complications: No apparent anesthesia complications

## 2011-12-25 NOTE — Progress Notes (Signed)
cbg  157  Dr Jean Rosenthal aware.

## 2011-12-25 NOTE — Progress Notes (Signed)
[facility] [place]  Pt ate so will delay his case until 3rd or 4th.  If emergency case conflicts, may have to delay until tomorrow.  Leonides Sake, MD [facility] [place] Office: [phone] Pager: [phone]  12/25/2011, 8:35 AM

## 2011-12-25 NOTE — Anesthesia Preprocedure Evaluation (Addendum)
Anesthesia Evaluation  Patient identified by MRN, date of birth, ID band Patient awake    Reviewed: Allergy & Precautions, H&P , NPO status , Patient's Chart, lab work & pertinent test results, reviewed documented beta blocker date and time   History of Anesthesia Complications Negative for: history of anesthetic complications  Airway Mallampati: II      Dental   Pulmonary shortness of breath and with exertion, Current Smoker,  breath sounds clear to auscultation        Cardiovascular hypertension, Pt. on medications and Pt. on home beta blockers + angina with exertion + CAD and + Peripheral Vascular Disease Rhythm:Regular Rate:Normal  Cardiomyopathy ef up to 50% from 20 % in 2000. S/p stent lad    Neuro/Psych PSYCHIATRIC DISORDERS negative neurological ROS     GI/Hepatic negative GI ROS, Neg liver ROS,   Endo/Other  Type 2, Oral Hypoglycemic Agents and Insulin DependentMorbid obesity  Renal/GU Renal InsufficiencyRenal disease (Cr 1.21)     Musculoskeletal negative musculoskeletal ROS (+)   Abdominal   Peds negative pediatric ROS (+)  Hematology negative hematology ROS (+)   Anesthesia Other Findings   Reproductive/Obstetrics                        Anesthesia Physical Anesthesia Plan  ASA: III  Anesthesia Plan: General   Post-op Pain Management:    Induction: Intravenous  Airway Management Planned: LMA  Additional Equipment:   Intra-op Plan:   Post-operative Plan: Extubation in OR  Informed Consent: I have reviewed the patients History and Physical, chart, labs and discussed the procedure including the risks, benefits and alternatives for the proposed anesthesia with the patient or authorized representative who has indicated his/her understanding and acceptance.     Plan Discussed with: CRNA, Anesthesiologist and Surgeon  Anesthesia Plan Comments:         Anesthesia Quick  Evaluation

## 2011-12-25 NOTE — Clinical Social Work Psychosocial (Signed)
Clinical Social Work Department BRIEF PSYCHOSOCIAL ASSESSMENT 12/25/2011  Patient:  Logan [name], Logan [name]     Account Number:  [id number]     Admit date:  12/20/2011  Clinical Social Worker:  Read Drivers  Date/Time:  12/25/2011 11:00 AM  Referred by:  RN  Date Referred:  12/25/2011 Referred for  SNF Placement   Other Referral:   none   Interview type:  Patient Other interview type:   none    PSYCHOSOCIAL DATA Living Status:  WIFE Admitted from facility:   Level of care:   Primary support name:  Pam Primary support relationship to patient:  SPOUSE Degree of support available:   fair    CURRENT CONCERNS Current Concerns  Post-Acute Placement   Other Concerns:   Pt refusal to follow MD recommedations for SNF or rehab    SOCIAL WORK ASSESSMENT / PLAN CSW will continue to f/u with pt regarding d/c plans   Assessment/plan status:  Other - See comment Other assessment/ plan:   CSW will work closely with pt and family regarding d/c plans   Information/referral to community resources:   SNF    PATIENT'S/FAMILY'S RESPONSE TO PLAN OF CARE: Pt was not receptive to SNF assistance or rehab.  Pt was willing to talk openly to CSW.  CSW will f/u   Vickii Penna, LCSWA [phone]  Clinical Social Work

## 2011-12-26 ENCOUNTER — Inpatient Hospital Stay
Admission: RE | Admit: 2011-12-26 | Discharge: 2012-02-17 | Disposition: A | Discharge status: Hospice | Payer: Medicare Other | Source: Ambulatory Visit | Attending: Internal Medicine | Admitting: Internal Medicine

## 2011-12-26 DIAGNOSIS — R609 Edema, unspecified: Principal | ICD-10-CM

## 2011-12-26 LAB — GLUCOSE, CAPILLARY
Glucose-Capillary: 221 mg/dL — ABNORMAL HIGH (ref 70–99)
Glucose-Capillary: 212 mg/dL — ABNORMAL HIGH (ref 70–99)

## 2011-12-26 MED ORDER — SULFAMETHOXAZOLE-TRIMETHOPRIM 800-160 MG PO TABS: 1.0000 | ORAL_TABLET | Freq: Two times a day (BID) | ORAL | Status: AC

## 2011-12-26 MED ORDER — OXYCODONE-ACETAMINOPHEN 5-325 MG PO TABS: 1.0000 | ORAL_TABLET | ORAL | Status: AC | PRN

## 2011-12-26 MED ORDER — INSULIN ASPART 100 UNIT/ML ~~LOC~~ SOLN: 0.0000 [IU] | Freq: Three times a day (TID) | SUBCUTANEOUS | Status: DC

## 2011-12-26 MED ORDER — PANTOPRAZOLE SODIUM 40 MG PO TBEC: 40.0000 mg | DELAYED_RELEASE_TABLET | Freq: Every day | ORAL | Status: DC

## 2011-12-26 MED ORDER — CIPROFLOXACIN HCL 500 MG PO TABS: 500.0000 mg | ORAL_TABLET | Freq: Two times a day (BID) | ORAL | Status: AC

## 2011-12-26 MED ORDER — DSS 100 MG PO CAPS: 100.0000 mg | ORAL_CAPSULE | Freq: Every day | ORAL | Status: AC

## 2011-12-26 MED ORDER — FUROSEMIDE 10 MG/ML IJ SOLN
INTRAMUSCULAR | Status: DC
Start: 2011-12-26 — End: 2011-12-26
  Filled 2011-12-26: qty 4

## 2011-12-26 MED ORDER — FUROSEMIDE 10 MG/ML IJ SOLN
20.0000 mg | Freq: Once | INTRAMUSCULAR | Status: AC
  Administered 2011-12-26: 20 mg via INTRAVENOUS

## 2011-12-26 MED ORDER — NICOTINE 21 MG/24HR TD PT24: 1.0000 | MEDICATED_PATCH | Freq: Every day | TRANSDERMAL | Status: AC

## 2011-12-26 MED ORDER — POLYETHYLENE GLYCOL 3350 17 G PO PACK: 17.0000 g | PACK | Freq: Every day | ORAL | Status: AC

## 2011-12-26 MED ORDER — ACETAMINOPHEN 325 MG PO TABS: 325.0000 mg | ORAL_TABLET | ORAL | Status: DC | PRN

## 2011-12-26 MED ORDER — INSULIN GLARGINE 100 UNIT/ML ~~LOC~~ SOLN: 60.0000 [IU] | Freq: Every day | SUBCUTANEOUS | Status: DC

## 2011-12-26 MED ORDER — CLOPIDOGREL BISULFATE 75 MG PO TABS: 75.0000 mg | ORAL_TABLET | Freq: Every day | ORAL | Status: AC

## 2011-12-26 MED FILL — Thrombin For Soln Kit 20000 Unit: CUTANEOUS | Qty: 1 | Status: AC

## 2011-12-26 NOTE — Progress Notes (Signed)
PT Cancellation Note  Treatment cancelled today due to pt declined any mobility at this time.  Will try another time.    Logan [name], [place] [phone] 12/26/2011, 9:21 AM

## 2011-12-26 NOTE — Discharge Summary (Signed)
[facility] Discharge Summary   Patient ID:  Logan [name] MRN: [id number] DOB/AGE: September 16, 1945 66 y.o.  Admit date: 12/20/2011 Discharge date: 12/26/2011 Date of Surgery: 12/20/2011 - 12/25/2011 Surgeon: Moishe Spice): Fransisco Hertz, MD  Admission Diagnosis: Occlusion  of bypass graft bilateral Peripheral Arterial Disease occlusion bypass graft   Discharge Diagnoses:  Occlusion  of bypass graft bilateral Peripheral Arterial Disease occlusion bypass graft   Secondary Diagnoses: Past Medical History  Diagnosis Date  . Overweight   . Dyslipidemia   . Cardiomyopathy   . Diabetes mellitus   . Coronary artery disease     taxis DES stent circumflex 02/2003.... residual total distal circumflex and 50% LAD/ cardiolite 08/2006 inferior scar no ischemia  . PVD (peripheral vascular disease)     right to left fem -fem bypass 1997/ aortabifemoral bypass 2005 right to left fem-fem 07/2004. Secondary to occluded righ tlimb of aortobifemoral bypass  . Tobacco abuse   . Anginal pain   . Hypertension   . Shortness of breath   . No pertinent past medical history     cardiomyopathy      Procedure(s): Date of Surgery: 12/20/2011  1. Left aortobifemoral limb thrombectomy 2. Femorofemoral graft thrombectomy 3. Revision of femorofemoral graft with interposition graft 4. Right tibial thrombectomy 5. Right four-compartment fasciotomies 6. Bilateral leg runoff Procedure(s): 12/25/2011  7. Closure of right medial calf fasciotomy incision (30 cm) 8. Partial closure of right lateral calf fasciotomy (5 cm) 9. Placement of negative pressure dressing Surgeon: Surgeon(s):  Fransisco Hertz, MD  Discharged Condition: stable  HPI:  Logan [name] is a 67 y.o. (07-26-1945) male, s/p multiple revascularization procedures including Aobifem BPG, multiple fem-fem, and L fem-pop, who presents with chief complaint: Bilateral leg pain. Pain moderate to severe, constant, and has worsened since 2 weeks ago. Pt  has had claudication for multiple years near. He now has BLE rest pain with mildly decrease in sensation in both feets. This patient is NOT a candidate for redo Aobifemoral bypass given prior two retroperitoneal procedure already completed. I would offer him an emergent: L ax-fem-fem BPG, BLE thrombectomy (likely groin and calf approaches), and likely BLE fasciotomies. Both groins have been operated on multiple times, so he is at very high risk of injury of nerves, arteries, and veins in either groin. This procedure also needs to be done emergently, so there is no time for cardiac optimization. With his significant cardiac history, he is at risk for myocardial infarction and death. He is aware even with the operation he is at risk for limb loss bilaterally.   Hospital Course:  Logan [name] is a 66 y.o. male is S/P procedures as listed above. Initial surgery on 12/20/11 for limb ischemia and re-op on 8/7 for closure of fasciotomies  He did well throughout hospital course. He had a second procedure as listed above His ischemic limbs have improved significantly and both LE are viable.  He had wound vacs placed and will need these continued.  He will be on a 7 day course of Cipro and Bactrim Post-op wounds healing well Pt. Ambulating, voiding and taking PO diet without difficulty. Pt pain controlled with PO pain meds. Labs as below Complications:see above  Consults:     Significant Diagnostic Studies: CBC Lab Results  Component Value Date   WBC 11.6* 12/24/2011   HGB 12.5* 12/24/2011   HCT 36.6* 12/24/2011   MCV 87.4 12/24/2011   PLT 165 12/24/2011    BMET  Component Value Date/Time   NA 137 12/24/2011 0550   K 3.6 12/24/2011 0550   CL 104 12/24/2011 0550   CO2 22 12/24/2011 0550   GLUCOSE 59* 12/24/2011 0550   BUN 7 12/24/2011 0550   CREATININE 1.21 12/24/2011 0550   CALCIUM 8.7 12/24/2011 0550   GFRNONAA 60* 12/24/2011 0550   GFRAA 70* 12/24/2011 0550   COAG Lab Results  Component Value Date   INR  0.97 12/20/2011     Disposition:  Discharge to :Skilled nursing facility Discharge Orders    Future Orders Please Complete By Expires   Resume previous diet      Call MD for:  temperature >100.5      Call MD for:  redness, tenderness, or signs of infection (pain, swelling, bleeding, redness, odor or green/yellow discharge around incision site)      Call MD for:  severe or increased pain, loss or decreased feeling  in affected limb(s)      Increase activity slowly      Comments:   Walk with assistance use walker or cane as needed   Walker       Discharge wound care:      Comments:   Change wound vac every M,W,F      Jeanpaul, Biehl  Home Medication Instructions ZOX:[id number]   Printed on:12/26/11 1053  Medication Information                    pioglitazone (ACTOS) 45 MG tablet Take 45 mg by mouth daily.           Omega-3 Fatty Acids (FISH OIL) 1000 MG CAPS Take 1 capsule by mouth daily.            glimepiride (AMARYL) 4 MG tablet Take 4 mg by mouth 2 (two) times daily.           hydrochlorothiazide (HYDRODIURIL) 25 MG tablet Take 25 mg by mouth daily.           lisinopril (PRINIVIL,ZESTRIL) 40 MG tablet Take 40 mg by mouth daily.           metFORMIN (GLUCOPHAGE) 500 MG tablet Take 1,000 mg by mouth 2 (two) times daily with a meal.           metoprolol (LOPRESSOR) 50 MG tablet Take 50 mg by mouth 2 (two) times daily.           Multiple Vitamins-Minerals (MULTIVITAMIN PO) Take 1 tablet by mouth daily.            simvastatin (ZOCOR) 20 MG tablet Take 20 mg by mouth every evening.           aspirin EC 81 MG tablet Take 81 mg by mouth daily.           acetaminophen (TYLENOL) 325 MG tablet Take 1-2 tablets (325-650 mg total) by mouth every 4 (four) hours as needed (or temp >/= 101 F).           clopidogrel (PLAVIX) 75 MG tablet Take 1 tablet (75 mg total) by mouth daily with breakfast.           docusate sodium 100 MG CAPS Take 100 mg by mouth daily.             insulin aspart (NOVOLOG) 100 UNIT/ML injection Inject 0-15 Units into the skin 3 (three) times daily with meals.           insulin glargine (LANTUS) 100 UNIT/ML injection Inject 60 Units  into the skin daily.           nicotine (NICODERM CQ - DOSED IN MG/24 HOURS) 21 mg/24hr patch Place 1 patch onto the skin daily.           oxyCODONE-acetaminophen (PERCOCET/ROXICET) 5-325 MG per tablet Take 1-2 tablets by mouth every 4 (four) hours as needed.           pantoprazole (PROTONIX) 40 MG tablet Take 1 tablet (40 mg total) by mouth daily at 12 noon.           polyethylene glycol (MIRALAX / GLYCOLAX) packet Take 17 g by mouth daily.           ciprofloxacin (CIPRO) 500 MG tablet Take 1 tablet (500 mg total) by mouth 2 (two) times daily.           sulfamethoxazole-trimethoprim (BACTRIM DS) 800-160 MG per tablet Take 1 tablet by mouth 2 (two) times daily.            Verbal and written Discharge instructions given to the patient. Wound care per Discharge AVS Follow-up Information    Follow up with Nilda Simmer, MD in 2 weeks. (office will arrange -sent)    Contact information:   [address] [place] [address] [phone]          Signed: Marlowe Shores 12/26/2011, 10:53 AM  Addendum  I have independently interviewed and examined the patient, and I agree with the physician assistant's discharge summary.  This patient has had 7-8 vascular reconstructions which make his groin access nearly impossible as evident by the fact, I had to essentially carve the bypass grafts out of the severe scar tissue in both groins.  The patient is aware he is running out of options.  I re-emphasized throughout his hospitalization the importance of maximal medical management including quitting smoking and getting better control of his diabetes.  He is aware he is at high risk for amputation bilaterally.  He will follow up with me in the office in 2 weeks for re-evaluation of the  right lateral fasciotomy incision which will be managed with a wound VAC.  The sutures and staples in the medial incision may be removed in 2 weeks.  After he recovers from this event, I return him to Dr. Candie Chroman care, whom he has been following for a while.  Leonides Sake, MD [facility] Head Office: [phone] Pager: [phone]  12/26/2011, 11:19 AM

## 2011-12-26 NOTE — Progress Notes (Signed)
CSW contacted Dr. Imogene Burn to sign FL2 and Rene Kocher to complete d/c summary.  Per Dr. Imogene Burn and Rene Kocher, pt is ready to d/c to SNF.  CSW visited with pt while in his room with his sister.  He is still sceptical about going to a SNF, but seems compliant at this point.  CSW will contact wife, Elita Quick and daughter Shanda Bumps to discuss bed offer at [facility] in [place]. Vickii Penna, LCSWA [phone]  Clinical Social Work

## 2011-12-26 NOTE — Progress Notes (Addendum)
[facility] [place]  Progress Note Bypass Surgery  Date of Surgery: 12/20/2011  1. Left aortobifemoral limb thrombectomy 2. Femorofemoral graft thrombectomy 3. Revision of femorofemoral graft with interposition graft 4. Right tibial thrombectomy 5. Right four-compartment fasciotomies 6. Bilateral leg runoff  Procedure(s): 12/25/2011 7. Closure of right medial calf fasciotomy incision (30 cm) 8. Partial closure of right lateral calf fasciotomy (5 cm) 9. Placement of negative pressure dressing Surgeon: Surgeon(s): Fransisco Hertz, MD  1 Day Post-Op  History of Present Illness  Logan [name] is a 66 y.o. male who is doing well after fasciotomy closure Patients pain is well controlled.    Significant Diagnostic Studies: CBC Lab Results  Component Value Date   WBC 11.6* 12/24/2011   HGB 12.5* 12/24/2011   HCT 36.6* 12/24/2011   MCV 87.4 12/24/2011   PLT 165 12/24/2011    BMET     Component Value Date/Time   NA 137 12/24/2011 0550   K 3.6 12/24/2011 0550   CL 104 12/24/2011 0550   CO2 22 12/24/2011 0550   GLUCOSE 59* 12/24/2011 0550   BUN 7 12/24/2011 0550   CREATININE 1.21 12/24/2011 0550   CALCIUM 8.7 12/24/2011 0550   GFRNONAA 60* 12/24/2011 0550   GFRAA 70* 12/24/2011 0550    COAG Lab Results  Component Value Date   INR 0.97 12/20/2011   No results found for this basename: PTT    Physical Examination  BP Readings from Last 3 Encounters:  12/26/11 137/62  12/26/11 137/62  12/20/11 137/59   Temp Readings from Last 3 Encounters:  12/26/11 100.3 F (37.9 C) Oral  12/26/11 100.3 F (37.9 C) Oral  12/20/11 98.5 F (36.9 C) Oral   SpO2 Readings from Last 3 Encounters:  12/26/11 98%  12/26/11 98%  12/20/11 100%   Pulse Readings from Last 3 Encounters:  12/26/11 95  12/26/11 95  12/20/11 88    Pt is A&O x 3 right lower extremity: Incision/s is/are clean,dry.intact, and  healing without hematoma, erythema or drainage Limb is warm; with good  color Assessment/Plan: Pt. Doing well Post-op pain is controlled Wounds are healing well - wound vac in place Will need wound vac for SNF PT/OT for ambulation Continue wound care as ordered  Marlowe Shores [phone] 12/26/2011 8:23 AM  Addendum  I have independently interviewed and examined the patient, and I agree with the physician assistant's findings.  I doubt the lateral fasciotomy incision will close within the next week given the significant edema in the muscle bed of the right calf.  At this point, patient can be discharged to SNF.  Family states they are not able to take care of him adequately at home.  Continue wound VAC at SNF for two weeks then return to the office for re-evaluation.  MIVF d/c.  Lasix 20 mg IV x 1 given.  Check BMP tomorrow.  Leonides Sake, MD [facility] [place] Office: [phone] Pager: [phone]  12/26/2011, 9:21 AM

## 2011-12-26 NOTE — Progress Notes (Signed)
CSW contacted pt wife, Logan [name] re: d/c plans.  Wife and pt chose [facility] for rehab stay.  Wife, Logan [name] asked me to approve these plans with daughter, Logan [name].  CSW contacted daughter, Logan [name].  Daughter, Logan [name] was agreeable to plans that pt and wife made re: d/c plans to [facility].  Pt reluctant to the idea of needing PT at a SNF, but is open to this being a need.  CSW called [facility] for 1:30 transport.  CSW texted daughter, Logan [name] to inform her that pt is ready and the transportation call had been placed.  CSW has also contacted Tami at [facility] re: transportation time. Vickii Penna, LCSWA [phone]  Clinical Social Work

## 2011-12-26 NOTE — Progress Notes (Signed)
Pt discharge instructions in packet to go to SNF. IV site d/c. Site WNL. Wound vac disconnected for transportation. Pt and family were updated. Report called to [facility]. Pt transported to [facility] via ambulance. Dion Saucier

## 2011-12-27 ENCOUNTER — Other Ambulatory Visit: Payer: Self-pay

## 2011-12-27 ENCOUNTER — Telehealth: Payer: Self-pay | Admitting: Vascular Surgery

## 2011-12-27 DIAGNOSIS — I739 Peripheral vascular disease, unspecified: Secondary | ICD-10-CM

## 2011-12-27 LAB — GLUCOSE, CAPILLARY
Glucose-Capillary: 113 mg/dL — ABNORMAL HIGH (ref 70–99)
Glucose-Capillary: 198 mg/dL — ABNORMAL HIGH (ref 70–99)
Glucose-Capillary: 186 mg/dL — ABNORMAL HIGH (ref 70–99)
Glucose-Capillary: 99 mg/dL (ref 70–99)

## 2011-12-27 NOTE — Clinical Social Work Placement (Signed)
Clinical Social Work Department CLINICAL SOCIAL WORK PLACEMENT NOTE 12/27/2011  Patient:  Logan [name], Logan [name]  Account Number:  [id number] Admit date:  12/20/2011  Clinical Social Worker:  Read Drivers  Date/time:  12/25/2011 11:05 AM  Clinical Social Work is seeking post-discharge placement for this patient at the following level of care:   SKILLED NURSING   (*CSW will update this form in Epic as items are completed)   12/25/2011  Patient/family provided with [facility] Department of Clinical Social Work's list of facilities offering this level of care within the geographic area requested by the patient (or if unable, by the patient's family).  12/25/2011  Patient/family informed of their freedom to choose among providers that offer the needed level of care, that participate in Medicare, Medicaid or managed care program needed by the patient, have an available bed and are willing to accept the patient.  12/25/2011  Patient/family informed of [facility]' ownership interest in [facility], as well as of the fact that they are under no obligation to receive care at this facility.  PASARR submitted to EDS on 12/25/2011 PASARR number received from EDS on   FL2 transmitted to all facilities in geographic area requested by pt/family on  12/25/2011 FL2 transmitted to all facilities within larger geographic area on   Patient informed that his/her managed care company has contracts with or will negotiate with  certain facilities, including the following:     Patient/family informed of bed offers received:  12/26/2011 Patient chooses bed at [facility] Physician recommends and patient chooses bed at    Patient to be transferred to [facility] on  12/26/2011 Patient to be transferred to facility by EMS  The following physician request were entered in Epic:   Additional Comments:  Vickii Penna, LCSWA [phone]  Clinical Social Work

## 2011-12-27 NOTE — Telephone Encounter (Addendum)
Message copied by Rosalyn Charters on Fri Dec 27, 2011  9:48 AM ------      Message from: Phillips Odor      Created: Fri Dec 27, 2011  9:11 AM       I checked w/ Dr. Imogene Burn- pt. Does need ABI's @ time of f/u, and I will put in computer/CP            ----- Message -----         From: Marlowe Shores, PA         Sent: 12/26/2011   8:43 AM           To: Melene Plan, RN            2 week F/U Imogene Burn - ? ABI's  not able to reach pt. by phone mailed appt. info

## 2011-12-28 LAB — CULTURE, BLOOD (ROUTINE X 2): Culture: NO GROWTH

## 2011-12-28 LAB — GLUCOSE, CAPILLARY: Glucose-Capillary: 152 mg/dL — ABNORMAL HIGH (ref 70–99)

## 2011-12-29 LAB — GLUCOSE, CAPILLARY
Glucose-Capillary: 134 mg/dL — ABNORMAL HIGH (ref 70–99)
Glucose-Capillary: 54 mg/dL — ABNORMAL LOW (ref 70–99)
Glucose-Capillary: 65 mg/dL — ABNORMAL LOW (ref 70–99)
Glucose-Capillary: 57 mg/dL — ABNORMAL LOW (ref 70–99)
Glucose-Capillary: 67 mg/dL — ABNORMAL LOW (ref 70–99)

## 2011-12-30 LAB — GLUCOSE, CAPILLARY: Glucose-Capillary: 240 mg/dL — ABNORMAL HIGH (ref 70–99)

## 2011-12-30 NOTE — Procedures (Unsigned)
AORTA-ILIAC DUPLEX EVALUATION  INDICATION:  Peripheral vascular disease.  HISTORY: Diabetes:  Yes. Cardiac: Hypertension:  Yes. Smoking:  Currently. Previous Surgery:  Left to right femoral to femoral bypass done 07/30/2004; aorta to right common femoral and left profunda femorial artery bypass on 09/28/2003.              SINGLE LEVEL ARTERIAL EXAM                             RIGHT                  LEFT Brachial: Anterior tibial: Posterior tibial: Peroneal: Ankle/brachial index:      0                      0 Previous ABI/date:         08/02/2004; 0.95       08/02/2004; 0.61  AORTA-ILIAC DUPLEX EXAM Aorta - Proximal     23.1 cm/s Aorta - Mid          0 Aorta - Distal       0  RIGHT                                   LEFT                   CIA-PROXIMAL                   CIA-DISTAL                   HYPOGASTRIC                   EIA-PROXIMAL                   EIA-MID                   EIA-DISTAL  IMPRESSION: 1. Technically difficult and limited evaluation of the abdominal aorta     due to body habitus and patient is not n.p.o.  Limited     visualization of mid to distal aorto-bifemoral bypass appears     occluded, no color flow or spectral Doppler waveform could be     obtained. 2. Left proximal anastomosis of the left to right femoral to femoral     bypass graft is visualized and appears occluded, no color flow or     spectral Doppler waveform could be obtained. 3. Left limb of the aorto-bifemoral is visualized and appears     occluded, no color flow or spectral Doppler waveform could be     obtained. 4. Right limb of aorto-bifemoral bypass is not visualized.  ___________________________________________ Fransisco Hertz, MD  SH/MEDQ  D:  12/20/2011  T:  12/20/2011  Job:  [id number]

## 2011-12-31 LAB — GLUCOSE, CAPILLARY
Glucose-Capillary: 168 mg/dL — ABNORMAL HIGH (ref 70–99)
Glucose-Capillary: 165 mg/dL — ABNORMAL HIGH (ref 70–99)

## 2012-01-01 LAB — GLUCOSE, CAPILLARY: Glucose-Capillary: 309 mg/dL — ABNORMAL HIGH (ref 70–99)

## 2012-01-02 ENCOUNTER — Ambulatory Visit (HOSPITAL_COMMUNITY): Payer: Medicare Other

## 2012-01-02 ENCOUNTER — Ambulatory Visit (HOSPITAL_COMMUNITY)
Admit: 2012-01-02 | Discharge: 2012-01-02 | Disposition: A | Discharge status: Home / Self Care | Payer: Medicare Other | Source: Skilled Nursing Facility | Attending: Internal Medicine | Admitting: Internal Medicine

## 2012-01-02 ENCOUNTER — Encounter: Payer: Self-pay | Admitting: Vascular Surgery

## 2012-01-02 DIAGNOSIS — Z9889 Other specified postprocedural states: Secondary | ICD-10-CM | POA: Insufficient documentation

## 2012-01-02 DIAGNOSIS — M7989 Other specified soft tissue disorders: Secondary | ICD-10-CM | POA: Insufficient documentation

## 2012-01-02 LAB — GLUCOSE, CAPILLARY: Glucose-Capillary: 76 mg/dL (ref 70–99)

## 2012-01-03 ENCOUNTER — Encounter: Payer: Self-pay | Admitting: Vascular Surgery

## 2012-01-03 ENCOUNTER — Ambulatory Visit (INDEPENDENT_AMBULATORY_CARE_PROVIDER_SITE_OTHER): Payer: Medicare Other | Admitting: Vascular Surgery

## 2012-01-03 VITALS — BP 100/65 | HR 82 | Temp 98.1°F | Resp 20 | Ht 69.0 in | Wt 220.0 lb

## 2012-01-03 DIAGNOSIS — I739 Peripheral vascular disease, unspecified: Secondary | ICD-10-CM | POA: Insufficient documentation

## 2012-01-03 LAB — GLUCOSE, CAPILLARY
Glucose-Capillary: 103 mg/dL — ABNORMAL HIGH (ref 70–99)
Glucose-Capillary: 204 mg/dL — ABNORMAL HIGH (ref 70–99)
Glucose-Capillary: 125 mg/dL — ABNORMAL HIGH (ref 70–99)
Glucose-Capillary: 162 mg/dL — ABNORMAL HIGH (ref 70–99)
Glucose-Capillary: 243 mg/dL — ABNORMAL HIGH (ref 70–99)

## 2012-01-03 NOTE — Progress Notes (Signed)
[facility] [place]  Postoperative Visit Bypass Surgery Date of Surgery: 12/21/11 PROCEDURE:  1. Left aortobifemoral limb thrombectomy 2. Femorofemoral graft thrombectomy 3. Revision of femorofemoral graft with interposition graft 4. Right tibial thrombectomy 5. Right four-compartment fasciotomies 6. Bilateral leg runoff  Surgeon: Loraine Maple, MD  History of Present Illness  Logan [name] is a 66 y.o. male who presents for postoperative follow-up for: wound check for right groin and right lateral fasciotomy wound. The patient's wound in the groin is dehisced but clean with some necrotic fat and exudative tissue - no graft exposed.  Patients pain is well controlled.  The patient's pre-operative symptoms of pain/ischemia in both legs are Improved.  Physical Examination  Filed Vitals:   01/03/12 1519  BP: 100/65  Pulse: 82  Temp: 98.1 F (36.7 C)  Resp: 20    Pt is A&O x 3 Gait is normal right lower extremity: Incision/s is/are separated but clean,, and left healed. BLE have min  Edema, with no erythema; with no hematoma Right lateral fasciotomy wound is still open but very clean with good granulation tissue  Right Dorsalis Pedis pulse is monophasic by Doppler Right Posterior tibial pulse is  biphasic by Doppler    Medical Decision Making  Logan [name] is a 66 y.o. year old male who presents s/p Left aortobifemoral limb thrombectomy Femorofemoral graft thrombectomy Revision of femorofemoral graft with interposition graft Right tibial thrombectomy Right four-compartment fasciotomies Bilateral leg runoff   Right groin wound is dehisced top layer and has some necrotic fat and exudative tissue.  We will put santyl in wound to clean this wound up and will possibly apply wound vac next week  Left groin wound needs dry dressing  Right lateral fasciotomy wound - DC VAC as skin and underlying tissue at same level re-epithelization may occur spontaneously.   Wet-to-dry dressing to lateral fasciotomy wound  Referral to Dr. Kelly Splinter for poss skin graft  F/U 01/10/12  Cipro x 10 days    Clinic MD: Leonides Sake

## 2012-01-03 NOTE — Patient Instructions (Signed)
Wet to dry dressing to right lateral fasciotomy site. Place adaptic over wound then wet to dry dressing - DAILY  Santyl then wet to dry right groin wound - DAILY < avoid skin around wound  Elevate both legs to reduce swelling

## 2012-01-04 LAB — GLUCOSE, CAPILLARY
Glucose-Capillary: 175 mg/dL — ABNORMAL HIGH (ref 70–99)
Glucose-Capillary: 250 mg/dL — ABNORMAL HIGH (ref 70–99)
Glucose-Capillary: 242 mg/dL — ABNORMAL HIGH (ref 70–99)

## 2012-01-05 LAB — GLUCOSE, CAPILLARY: Glucose-Capillary: 290 mg/dL — ABNORMAL HIGH (ref 70–99)

## 2012-01-06 LAB — GLUCOSE, CAPILLARY
Glucose-Capillary: 154 mg/dL — ABNORMAL HIGH (ref 70–99)
Glucose-Capillary: 156 mg/dL — ABNORMAL HIGH (ref 70–99)
Glucose-Capillary: 220 mg/dL — ABNORMAL HIGH (ref 70–99)
Glucose-Capillary: 282 mg/dL — ABNORMAL HIGH (ref 70–99)

## 2012-01-07 LAB — GLUCOSE, CAPILLARY
Glucose-Capillary: 183 mg/dL — ABNORMAL HIGH (ref 70–99)
Glucose-Capillary: 299 mg/dL — ABNORMAL HIGH (ref 70–99)

## 2012-01-08 LAB — GLUCOSE, CAPILLARY
Glucose-Capillary: 193 mg/dL — ABNORMAL HIGH (ref 70–99)
Glucose-Capillary: 210 mg/dL — ABNORMAL HIGH (ref 70–99)
Glucose-Capillary: 205 mg/dL — ABNORMAL HIGH (ref 70–99)

## 2012-01-09 ENCOUNTER — Encounter: Payer: Self-pay | Admitting: Vascular Surgery

## 2012-01-09 LAB — GLUCOSE, CAPILLARY
Glucose-Capillary: 125 mg/dL — ABNORMAL HIGH (ref 70–99)
Glucose-Capillary: 227 mg/dL — ABNORMAL HIGH (ref 70–99)

## 2012-01-10 ENCOUNTER — Encounter: Payer: Self-pay | Admitting: Vascular Surgery

## 2012-01-10 ENCOUNTER — Encounter (INDEPENDENT_AMBULATORY_CARE_PROVIDER_SITE_OTHER): Payer: Medicare Other | Admitting: *Deleted

## 2012-01-10 ENCOUNTER — Ambulatory Visit (INDEPENDENT_AMBULATORY_CARE_PROVIDER_SITE_OTHER): Payer: Medicare Other | Admitting: Vascular Surgery

## 2012-01-10 VITALS — BP 104/57 | HR 77 | Temp 98.1°F | Ht 69.0 in | Wt 220.0 lb

## 2012-01-10 DIAGNOSIS — I739 Peripheral vascular disease, unspecified: Secondary | ICD-10-CM

## 2012-01-10 DIAGNOSIS — Z48812 Encounter for surgical aftercare following surgery on the circulatory system: Secondary | ICD-10-CM

## 2012-01-10 LAB — GLUCOSE, CAPILLARY: Glucose-Capillary: 228 mg/dL — ABNORMAL HIGH (ref 70–99)

## 2012-01-10 NOTE — Progress Notes (Signed)
[facility] [place]  Postoperative Visit  History of Present Illness  Logan [name] is a 66 y.o. year old male who presents for postoperative follow-up for:  PROCEDURE:  (Date 12/20/11) 1. Left aortobifemoral limb thrombectomy 2. Femorofemoral graft thrombectomy 3. Revision of femorofemoral graft with interposition graft 4. Right tibial thrombectomy 5. Right four-compartment fasciotomies 6. Bilateral leg runoff  R leg medial fasciotomy incision closure, lateral fasciotomy partial closure (Date: 12/25/11) The right groin wound has been changed to a calcium alginate.  Pt notes occasional drainage from left groin.  He feels right leg continues to be swollen.  He is continuing with PT at this time  Physical Examination  Filed Vitals:   01/10/12 1001  BP: 104/57  Pulse: 77  Temp: 98.1 F (36.7 C)   RLE: right groin: some fibinous exudate which I debrided, otherwise good granulation tissue, medial incision clean: sutures removed, every other suture removed, leg remains edematous, lateral fasciotomy incision clean with viable muscle, sutures removed in upper pole and staples left  BLE ABI (Date: 01/10/12)  R: 0.66, Pt: triphasic, DP: monophasic  L: 0.70, Pt and DP: monophasic  Medical Decision Making  Logan [name] is a 66 y.o. year old male who presents s/p TE of occluded L ABF limb, fem-fem, and tibial arteries, fasciotomy closures. Rt groin appears clean enough to start VAC therapy R lateral fasciotomy incision is being evaluated by Dr. Kelly Splinter for a STSG on Monday. Patient is continuing to smoke.  Patient is aware the likelihood of failure to heal and re-occlude with continued smoking. I discussed in depth with the patient the nature of atherosclerosis, and emphasized the importance of maximal medical management including strict control of blood pressure, blood glucose, and lipid levels, obtaining regular exercise, and cessation of smoking.  The patient is aware  that without maximal medical management the underlying atherosclerotic disease process will progress, limiting the benefit of any interventions. Follow up in one week  Thank you for allowing [place] to participate in this patient's care.  Leonides Sake, MD [facility] [place] Office: [phone] Pager: [phone]

## 2012-01-12 LAB — GLUCOSE, CAPILLARY
Glucose-Capillary: 257 mg/dL — ABNORMAL HIGH (ref 70–99)
Glucose-Capillary: 161 mg/dL — ABNORMAL HIGH (ref 70–99)

## 2012-01-13 ENCOUNTER — Encounter (HOSPITAL_BASED_OUTPATIENT_CLINIC_OR_DEPARTMENT_OTHER): Payer: Medicare Other | Attending: Plastic Surgery

## 2012-01-13 DIAGNOSIS — Z79899 Other long term (current) drug therapy: Secondary | ICD-10-CM | POA: Insufficient documentation

## 2012-01-13 DIAGNOSIS — E119 Type 2 diabetes mellitus without complications: Secondary | ICD-10-CM | POA: Insufficient documentation

## 2012-01-13 DIAGNOSIS — L98499 Non-pressure chronic ulcer of skin of other sites with unspecified severity: Secondary | ICD-10-CM | POA: Insufficient documentation

## 2012-01-13 DIAGNOSIS — I739 Peripheral vascular disease, unspecified: Secondary | ICD-10-CM | POA: Insufficient documentation

## 2012-01-13 DIAGNOSIS — I10 Essential (primary) hypertension: Secondary | ICD-10-CM | POA: Insufficient documentation

## 2012-01-13 DIAGNOSIS — Z794 Long term (current) use of insulin: Secondary | ICD-10-CM | POA: Insufficient documentation

## 2012-01-13 DIAGNOSIS — L97809 Non-pressure chronic ulcer of other part of unspecified lower leg with unspecified severity: Secondary | ICD-10-CM | POA: Insufficient documentation

## 2012-01-13 DIAGNOSIS — I2510 Atherosclerotic heart disease of native coronary artery without angina pectoris: Secondary | ICD-10-CM | POA: Insufficient documentation

## 2012-01-13 DIAGNOSIS — Z7982 Long term (current) use of aspirin: Secondary | ICD-10-CM | POA: Insufficient documentation

## 2012-01-13 LAB — GLUCOSE, CAPILLARY: Glucose-Capillary: 209 mg/dL — ABNORMAL HIGH (ref 70–99)

## 2012-01-14 LAB — GLUCOSE, CAPILLARY: Glucose-Capillary: 284 mg/dL — ABNORMAL HIGH (ref 70–99)

## 2012-01-14 NOTE — Progress Notes (Signed)
[facility]  NAME:  Logan [name], Logan [name] NO.:  [id number]  MEDICAL RECORD NO.:  [id number]      DATE OF BIRTH:  1946-04-03  PHYSICIAN:  Wayland Denis, DO       VISIT DATE:  01/13/2012                                  OFFICE VISIT   HISTORY:  The patient is a 66 year old man, who is here for consultation for his bilateral groin ulcers and his right lateral leg ulcer.  He underwent a left aortobifem limb, thrombectomy with fem-fem graft, thrombectomy and a revision fem-fem graft with interposition graft along with a right tibial thrombectomy and right four-compartment fasciotomies.  This was done on December 21, 2011, he ended up with bilateral groin wounds and a right lower extremity fasciotomy wound. The VAC on the groins and at 1 time never using the VAC on the leg and most recently they have been using VAC just on the groin area and Santyl on the leg.  PAST MEDICAL HISTORY:  Multiple vascular surgeries with bypass grafts and fasciotomies in August 2013, peripheral arterial disease, peripheral vascular disease, diabetes, coronary artery disease, cardiomyopathy, hypertension, angina, tobacco use, obesity, shortness of breath.  MEDICATIONS:  Aspirin, fish oil, Amaryl, lisinopril, metoprolol, multivitamin, simvastatin, Lantus, Pro-Stat, clopidogrel, Prilosec, NovoLog, and oxycodone.  ALLERGIES:  HE ACTUALLY HAS LISTED PENICILLIN ALLERGY.  REVIEW OF SYSTEMS:  Otherwise, negative.  He has had a facility at the moment, but the plan is eventually to get him home.  He is frustrated at the condition.  PHYSICAL EXAMINATION:  GENERAL:  He is alert and oriented.  He is cooperative, but little agitated. HEENT:  Pupils are equal.  Extraocular muscles are intact. NECK:  No cervical lymphadenopathy. ABDOMEN:  Obese, but soft.  No organomegaly appreciable.  His fasciotomy site is clean with very little fibrous tissue.  It does not appear to be infected.   The groin areas have some granulation tissue and seem to be in the process of healing.  The areas are fairly deep. Unfortunately, I recommend continuing with VAC on all areas and work on getting him to the OR for ACell placement and Santyl on the areas as well in the meantime.     Wayland Denis, DO    CS/MEDQ  D:  01/13/2012  T:  01/14/2012  Job:  [id number]

## 2012-01-15 LAB — GLUCOSE, CAPILLARY
Glucose-Capillary: 242 mg/dL — ABNORMAL HIGH (ref 70–99)
Glucose-Capillary: 238 mg/dL — ABNORMAL HIGH (ref 70–99)
Glucose-Capillary: 237 mg/dL — ABNORMAL HIGH (ref 70–99)

## 2012-01-16 ENCOUNTER — Encounter: Payer: Self-pay | Admitting: Vascular Surgery

## 2012-01-16 LAB — GLUCOSE, CAPILLARY: Glucose-Capillary: 217 mg/dL — ABNORMAL HIGH (ref 70–99)

## 2012-01-17 ENCOUNTER — Encounter: Payer: Self-pay | Admitting: Vascular Surgery

## 2012-01-17 ENCOUNTER — Ambulatory Visit (INDEPENDENT_AMBULATORY_CARE_PROVIDER_SITE_OTHER): Payer: Medicare Other | Admitting: Vascular Surgery

## 2012-01-17 VITALS — BP 110/44 | HR 61 | Resp 18 | Ht 69.0 in | Wt 215.0 lb

## 2012-01-17 DIAGNOSIS — I70219 Atherosclerosis of native arteries of extremities with intermittent claudication, unspecified extremity: Secondary | ICD-10-CM

## 2012-01-17 LAB — GLUCOSE, CAPILLARY
Glucose-Capillary: 285 mg/dL — ABNORMAL HIGH (ref 70–99)
Glucose-Capillary: 225 mg/dL — ABNORMAL HIGH (ref 70–99)

## 2012-01-17 NOTE — Progress Notes (Signed)
[facility] [place]   Postoperative Visit   History of Present Illness   Logan [name] is a 66 y.o. year old male who presents for postoperative follow-up for:   PROCEDURE: (Date 12/20/11)  1. Left aortobifemoral limb thrombectomy 2. Femorofemoral graft thrombectomy 3. Revision of femorofemoral graft with interposition graft 4. Right tibial thrombectomy 5. Right four-compartment fasciotomies 6. Bilateral leg runoff  R leg medial fasciotomy incision closure, lateral fasciotomy partial closure (Date: 12/25/11) The right groin wound is on VAC therapy.  Pt is going to be scheduled for R calf STSG by Dr. Kelly Splinter.  Date to be determined.  Physical Examination   Filed Vitals:   01/17/12 1129  BP: 110/44  Pulse: 61  Resp: 18  Height: 5\' 9"  (1.753 m)  Weight: 215 lb (97.523 kg)   RLE: right groin: good granulation with good bleeding,  medial incision healing, staples can be removed, leg remains edematous, lateral fasciotomy incision clean with viable muscle, staples will be removed   Medical Decision Making  Logan [name] is a 66 y.o. year old male who presents s/p TE of occluded L ABF limb, fem-fem, and tibial arteries, fasciotomy closures.  Continue VAC to R groin STSG R lateral calf per Dr. Kelly Splinter Patient is continuing to smoke. Patient is aware the likelihood of failure to heal and re-occlude with continued smoking.  I discussed in depth with the patient the nature of atherosclerosis, and emphasized the importance of maximal medical management including strict control of blood pressure, blood glucose, and lipid levels, obtaining regular exercise, and cessation of smoking. The patient is aware that without maximal medical management the underlying atherosclerotic disease process will progress, limiting the benefit of any interventions.  Follow up in two weeks.  Leonides Sake, MD  [facility] [place]  Office: [phone]  Pager: [phone]

## 2012-01-19 LAB — GLUCOSE, CAPILLARY: Glucose-Capillary: 226 mg/dL — ABNORMAL HIGH (ref 70–99)

## 2012-01-20 LAB — GLUCOSE, CAPILLARY
Glucose-Capillary: 203 mg/dL — ABNORMAL HIGH (ref 70–99)
Glucose-Capillary: 248 mg/dL — ABNORMAL HIGH (ref 70–99)

## 2012-01-21 LAB — GLUCOSE, CAPILLARY
Glucose-Capillary: 176 mg/dL — ABNORMAL HIGH (ref 70–99)
Glucose-Capillary: 68 mg/dL — ABNORMAL LOW (ref 70–99)

## 2012-01-22 LAB — GLUCOSE, CAPILLARY
Glucose-Capillary: 188 mg/dL — ABNORMAL HIGH (ref 70–99)
Glucose-Capillary: 150 mg/dL — ABNORMAL HIGH (ref 70–99)

## 2012-01-23 LAB — GLUCOSE, CAPILLARY
Glucose-Capillary: 188 mg/dL — ABNORMAL HIGH (ref 70–99)
Glucose-Capillary: 144 mg/dL — ABNORMAL HIGH (ref 70–99)
Glucose-Capillary: 182 mg/dL — ABNORMAL HIGH (ref 70–99)

## 2012-01-24 LAB — GLUCOSE, CAPILLARY
Glucose-Capillary: 169 mg/dL — ABNORMAL HIGH (ref 70–99)
Glucose-Capillary: 185 mg/dL — ABNORMAL HIGH (ref 70–99)

## 2012-01-25 LAB — GLUCOSE, CAPILLARY
Glucose-Capillary: 142 mg/dL — ABNORMAL HIGH (ref 70–99)
Glucose-Capillary: 157 mg/dL — ABNORMAL HIGH (ref 70–99)

## 2012-01-26 LAB — GLUCOSE, CAPILLARY
Glucose-Capillary: 83 mg/dL (ref 70–99)
Glucose-Capillary: 271 mg/dL — ABNORMAL HIGH (ref 70–99)
Glucose-Capillary: 167 mg/dL — ABNORMAL HIGH (ref 70–99)
Glucose-Capillary: 132 mg/dL — ABNORMAL HIGH (ref 70–99)

## 2012-01-27 LAB — GLUCOSE, CAPILLARY: Glucose-Capillary: 171 mg/dL — ABNORMAL HIGH (ref 70–99)

## 2012-01-28 LAB — GLUCOSE, CAPILLARY
Glucose-Capillary: 141 mg/dL — ABNORMAL HIGH (ref 70–99)
Glucose-Capillary: 158 mg/dL — ABNORMAL HIGH (ref 70–99)
Glucose-Capillary: 156 mg/dL — ABNORMAL HIGH (ref 70–99)

## 2012-01-29 LAB — GLUCOSE, CAPILLARY
Glucose-Capillary: 209 mg/dL — ABNORMAL HIGH (ref 70–99)
Glucose-Capillary: 157 mg/dL — ABNORMAL HIGH (ref 70–99)

## 2012-01-30 ENCOUNTER — Encounter: Payer: Self-pay | Admitting: Vascular Surgery

## 2012-01-30 LAB — GLUCOSE, CAPILLARY
Glucose-Capillary: 157 mg/dL — ABNORMAL HIGH (ref 70–99)
Glucose-Capillary: 234 mg/dL — ABNORMAL HIGH (ref 70–99)
Glucose-Capillary: 191 mg/dL — ABNORMAL HIGH (ref 70–99)

## 2012-01-31 ENCOUNTER — Ambulatory Visit (INDEPENDENT_AMBULATORY_CARE_PROVIDER_SITE_OTHER): Payer: Medicare Other | Admitting: Vascular Surgery

## 2012-01-31 ENCOUNTER — Encounter: Payer: Self-pay | Admitting: Vascular Surgery

## 2012-01-31 VITALS — BP 118/73 | HR 70 | Resp 16 | Ht 69.0 in | Wt 220.0 lb

## 2012-01-31 DIAGNOSIS — I70219 Atherosclerosis of native arteries of extremities with intermittent claudication, unspecified extremity: Secondary | ICD-10-CM | POA: Insufficient documentation

## 2012-01-31 LAB — GLUCOSE, CAPILLARY
Glucose-Capillary: 153 mg/dL — ABNORMAL HIGH (ref 70–99)
Glucose-Capillary: 158 mg/dL — ABNORMAL HIGH (ref 70–99)

## 2012-01-31 NOTE — Progress Notes (Signed)
[facility]    Postoperative Visit   History of Present Illness  Logan [name] is a 66 y.o. year old male who presents for postoperative follow-up for:  PROCEDURE: (Date 12/20/11)  1. Left aortobifemoral limb thrombectomy 2. Femorofemoral graft thrombectomy 3. Revision of femorofemoral graft with interposition graft 4. Right tibial thrombectomy 5. Right four-compartment fasciotomies 6. Bilateral leg runoff R leg medial fasciotomy incision closure, lateral fasciotomy partial closure (Date: 12/25/11) The right groin wound is on VAC therapy. Pt is going to be scheduled for R calf ?Apligraft by Dr. Kelly Splinter. date to be determined. He has appt with her this Monday.  Physical Examination   Filed Vitals:   01/31/12 1237  BP: 118/73  Pulse: 70  Resp: 16  Height: 5\' 9"  (1.753 m)  Weight: 220 lb (99.791 kg)  SpO2: 98%   RLE: right groin: good granulation with good bleeding, wound has contracted enough that VAC is no longer needed, L groin: ballotable fluid collection in left groin c/w seroma,  Right leg bandaged  Medical Decision Making  Logan [name] is a 66 y.o. year old male who presents s/p TE of occluded L ABF limb, fem-fem, and tibial arteries, fasciotomy closures.  D/C VAC dressing in R groin. Wet-to-dry dressing BID to R groin.  Dry dressing to L groin. R lateral calf mgmt per Dr. Kelly Splinter. Patient is continuing to smoke. Patient is aware the likelihood of failure to heal and re-occlude with continued smoking.  I discussed in depth with the patient the nature of atherosclerosis, and emphasized the importance of maximal medical management including strict control of blood pressure, blood glucose, and lipid levels, obtaining regular exercise, and cessation of smoking. The patient is aware that without maximal medical management the underlying atherosclerotic disease process will progress, limiting the benefit of any interventions.  Follow up in two weeks.  Leonides Sake, MD [facility] [place] Office: [phone] Pager: [phone]  01/31/2012, 1:28 PM

## 2012-02-03 ENCOUNTER — Encounter (HOSPITAL_BASED_OUTPATIENT_CLINIC_OR_DEPARTMENT_OTHER): Payer: Medicare Other | Attending: Plastic Surgery

## 2012-02-03 DIAGNOSIS — L98499 Non-pressure chronic ulcer of skin of other sites with unspecified severity: Secondary | ICD-10-CM | POA: Insufficient documentation

## 2012-02-03 DIAGNOSIS — L97809 Non-pressure chronic ulcer of other part of unspecified lower leg with unspecified severity: Secondary | ICD-10-CM | POA: Insufficient documentation

## 2012-02-03 LAB — GLUCOSE, CAPILLARY
Glucose-Capillary: 146 mg/dL — ABNORMAL HIGH (ref 70–99)
Glucose-Capillary: 215 mg/dL — ABNORMAL HIGH (ref 70–99)

## 2012-02-04 LAB — GLUCOSE, CAPILLARY: Glucose-Capillary: 176 mg/dL — ABNORMAL HIGH (ref 70–99)

## 2012-02-05 LAB — GLUCOSE, CAPILLARY
Glucose-Capillary: 198 mg/dL — ABNORMAL HIGH (ref 70–99)
Glucose-Capillary: 143 mg/dL — ABNORMAL HIGH (ref 70–99)

## 2012-02-06 LAB — GLUCOSE, CAPILLARY: Glucose-Capillary: 166 mg/dL — ABNORMAL HIGH (ref 70–99)

## 2012-02-07 LAB — GLUCOSE, CAPILLARY
Glucose-Capillary: 168 mg/dL — ABNORMAL HIGH (ref 70–99)
Glucose-Capillary: 217 mg/dL — ABNORMAL HIGH (ref 70–99)

## 2012-02-08 LAB — GLUCOSE, CAPILLARY: Glucose-Capillary: 183 mg/dL — ABNORMAL HIGH (ref 70–99)

## 2012-02-09 LAB — GLUCOSE, CAPILLARY: Glucose-Capillary: 190 mg/dL — ABNORMAL HIGH (ref 70–99)

## 2012-02-10 ENCOUNTER — Encounter (HOSPITAL_BASED_OUTPATIENT_CLINIC_OR_DEPARTMENT_OTHER): Payer: Medicare Other

## 2012-02-11 LAB — GLUCOSE, CAPILLARY
Glucose-Capillary: 185 mg/dL — ABNORMAL HIGH (ref 70–99)
Glucose-Capillary: 174 mg/dL — ABNORMAL HIGH (ref 70–99)
Glucose-Capillary: 256 mg/dL — ABNORMAL HIGH (ref 70–99)

## 2012-02-11 NOTE — Progress Notes (Signed)
[facility]  NAME:  SOLMON, BOHR NO.:  [id number]  MEDICAL RECORD NO.:  [id number]      DATE OF BIRTH:  [birth year]  PHYSICIAN:  Wayland Denis, DO       VISIT DATE:  02/10/2012                                  OFFICE VISIT   The patient is a 66 year old black male who is here for followup on his right lower extremity ulcer and right groin ulcer.  He has been using combination of different products on the wounds.  The groins are healing up very well.  There has been no change in his medication or social history.  He is still in Rehab.  On exam, he is alert, oriented, and cooperative, not in any acute distress.  He does ask the same question multiple times, but this is not out of the ordinary.  His pupils are equal.  Extraocular muscles are intact.  No cervical lymphadenopathy.  His breathing is unlabored.  His heart is regular.  The wounds are markedly improved.  The left one in the groin have completely closed up, the right one is much smaller than it had been.  The leg wound is not significantly different, it has filled in some, but it still large in open.  We will plan on [facility] for the next week, apply for the [facility] and work on getting him on the operating room schedule for irrigation and debridement and placement of ACell versus skin graft.     Wayland Denis, DO     CS/MEDQ  D:  02/10/2012  T:  02/11/2012  Job:  [id number]

## 2012-02-12 ENCOUNTER — Encounter (HOSPITAL_BASED_OUTPATIENT_CLINIC_OR_DEPARTMENT_OTHER): Payer: Self-pay | Admitting: *Deleted

## 2012-02-13 ENCOUNTER — Encounter (HOSPITAL_BASED_OUTPATIENT_CLINIC_OR_DEPARTMENT_OTHER): Payer: Self-pay | Admitting: *Deleted

## 2012-02-13 ENCOUNTER — Telehealth: Payer: Self-pay | Admitting: *Deleted

## 2012-02-13 LAB — GLUCOSE, CAPILLARY
Glucose-Capillary: 184 mg/dL — ABNORMAL HIGH (ref 70–99)
Glucose-Capillary: 247 mg/dL — ABNORMAL HIGH (ref 70–99)

## 2012-02-13 NOTE — Telephone Encounter (Signed)
Logan [name] called to say he wanted to cancel his office visit tomorrow with Dr Imogene Burn.He is going to have skin grafts Monday by Dr Kelly Splinter on his right leg. He said his Left groin wound was about healed and the right groin wound was much better.He said they were doing collagen dressing on that and a Dr Roxan Hockey said it was getting better. I asked him to please call back and reschedule as soon as he could.

## 2012-02-14 ENCOUNTER — Ambulatory Visit: Payer: Medicare Other | Admitting: Vascular Surgery

## 2012-02-14 ENCOUNTER — Encounter (HOSPITAL_BASED_OUTPATIENT_CLINIC_OR_DEPARTMENT_OTHER): Payer: Self-pay | Admitting: *Deleted

## 2012-02-14 ENCOUNTER — Other Ambulatory Visit: Payer: Self-pay | Admitting: Physician Assistant

## 2012-02-14 LAB — GLUCOSE, CAPILLARY: Glucose-Capillary: 250 mg/dL — ABNORMAL HIGH (ref 70–99)

## 2012-02-14 NOTE — Progress Notes (Signed)
Spoke with Dr Council Mechanic regarding whether appropriate for  ambulatory setting-he reviewed chart and felt could proceed at [facility] orders from surgeon,unable to reach patient-labs per anesthesia unless orders from Dr Karlyne Greenspan in epic.

## 2012-02-14 NOTE — Progress Notes (Addendum)
Spoke with nurse from nursing center-[facility]- [phone]-history ,medications reviewed-instructions given,arrival time of 0945-stated family (wife) should be bringing-will follow up.Npo after Mn-to take his metoprolol with sip of water that am.Della RN stated wound vac will be applied after returns to their center via [place] Ray[place] PA.

## 2012-02-16 LAB — GLUCOSE, CAPILLARY: Glucose-Capillary: 176 mg/dL — ABNORMAL HIGH (ref 70–99)

## 2012-02-17 ENCOUNTER — Encounter (HOSPITAL_BASED_OUTPATIENT_CLINIC_OR_DEPARTMENT_OTHER): Payer: Self-pay | Admitting: Anesthesiology

## 2012-02-17 ENCOUNTER — Ambulatory Visit (HOSPITAL_BASED_OUTPATIENT_CLINIC_OR_DEPARTMENT_OTHER): Payer: Medicare Other | Admitting: Anesthesiology

## 2012-02-17 ENCOUNTER — Other Ambulatory Visit: Payer: Self-pay | Admitting: Plastic Surgery

## 2012-02-17 ENCOUNTER — Encounter (HOSPITAL_BASED_OUTPATIENT_CLINIC_OR_DEPARTMENT_OTHER)
Admission: RE | Disposition: A | Discharge status: Skilled Nursing Facility | Payer: Self-pay | Source: Ambulatory Visit | Attending: Plastic Surgery

## 2012-02-17 ENCOUNTER — Inpatient Hospital Stay
Admission: RE | Admit: 2012-02-17 | Discharge: 2012-03-02 | Disposition: A | Discharge status: Home / Self Care | Payer: Medicare Other | Source: Ambulatory Visit | Attending: Internal Medicine | Admitting: Internal Medicine

## 2012-02-17 ENCOUNTER — Ambulatory Visit (HOSPITAL_BASED_OUTPATIENT_CLINIC_OR_DEPARTMENT_OTHER)
Admission: RE | Admit: 2012-02-17 | Discharge: 2012-02-17 | Disposition: A | Discharge status: Skilled Nursing Facility | Payer: Medicare Other | Source: Ambulatory Visit | Attending: Plastic Surgery | Admitting: Plastic Surgery

## 2012-02-17 ENCOUNTER — Encounter (HOSPITAL_BASED_OUTPATIENT_CLINIC_OR_DEPARTMENT_OTHER): Payer: Self-pay | Admitting: *Deleted

## 2012-02-17 DIAGNOSIS — I2510 Atherosclerotic heart disease of native coronary artery without angina pectoris: Secondary | ICD-10-CM | POA: Insufficient documentation

## 2012-02-17 DIAGNOSIS — Z7982 Long term (current) use of aspirin: Secondary | ICD-10-CM | POA: Insufficient documentation

## 2012-02-17 DIAGNOSIS — F17200 Nicotine dependence, unspecified, uncomplicated: Secondary | ICD-10-CM | POA: Insufficient documentation

## 2012-02-17 DIAGNOSIS — I428 Other cardiomyopathies: Secondary | ICD-10-CM | POA: Insufficient documentation

## 2012-02-17 DIAGNOSIS — L97909 Non-pressure chronic ulcer of unspecified part of unspecified lower leg with unspecified severity: Secondary | ICD-10-CM

## 2012-02-17 DIAGNOSIS — L98499 Non-pressure chronic ulcer of skin of other sites with unspecified severity: Secondary | ICD-10-CM | POA: Insufficient documentation

## 2012-02-17 DIAGNOSIS — I739 Peripheral vascular disease, unspecified: Secondary | ICD-10-CM | POA: Insufficient documentation

## 2012-02-17 DIAGNOSIS — E785 Hyperlipidemia, unspecified: Secondary | ICD-10-CM | POA: Insufficient documentation

## 2012-02-17 DIAGNOSIS — I10 Essential (primary) hypertension: Secondary | ICD-10-CM | POA: Insufficient documentation

## 2012-02-17 DIAGNOSIS — Z79899 Other long term (current) drug therapy: Secondary | ICD-10-CM | POA: Insufficient documentation

## 2012-02-17 DIAGNOSIS — E119 Type 2 diabetes mellitus without complications: Secondary | ICD-10-CM | POA: Insufficient documentation

## 2012-02-17 HISTORY — PX: INCISION AND DRAINAGE OF WOUND: SHX1803

## 2012-02-17 LAB — GLUCOSE, CAPILLARY: Glucose-Capillary: 145 mg/dL — ABNORMAL HIGH (ref 70–99)

## 2012-02-17 LAB — POCT I-STAT 4, (NA,K, GLUC, HGB,HCT)
Glucose, Bld: 202 mg/dL — ABNORMAL HIGH (ref 70–99)
HCT: 32 % — ABNORMAL LOW (ref 39.0–52.0)
Potassium: 4.2 mEq/L (ref 3.5–5.1)

## 2012-02-17 SURGERY — IRRIGATION AND DEBRIDEMENT OF WOUND WITH SPLIT THICKNESS SKIN GRAFT
Anesthesia: General | Site: Leg Lower | Laterality: Bilateral | Wound class: Clean Contaminated

## 2012-02-17 MED ORDER — EVICEL 5 ML EX KIT
PACK | CUTANEOUS | Status: DC | PRN
  Administered 2012-02-17: 5 mL

## 2012-02-17 MED ORDER — EPHEDRINE SULFATE 50 MG/ML IJ SOLN
INTRAMUSCULAR | Status: DC | PRN
  Administered 2012-02-17: 15 mg via INTRAVENOUS
  Administered 2012-02-17: 10 mg via INTRAVENOUS

## 2012-02-17 MED ORDER — FENTANYL CITRATE 0.05 MG/ML IJ SOLN
INTRAMUSCULAR | Status: DC | PRN
Start: 2012-02-17 — End: 2012-02-17
  Administered 2012-02-17 (×2): 50 ug via INTRAVENOUS
  Administered 2012-02-17: 25 ug via INTRAVENOUS

## 2012-02-17 MED ORDER — CIPROFLOXACIN IN D5W 400 MG/200ML IV SOLN
400.0000 mg | INTRAVENOUS | Status: AC
  Administered 2012-02-17: 400 mg via INTRAVENOUS

## 2012-02-17 MED ORDER — FENTANYL CITRATE 0.05 MG/ML IJ SOLN: 50.0000 ug | INTRAMUSCULAR | Status: DC | PRN

## 2012-02-17 MED ORDER — PROPOFOL 10 MG/ML IV BOLUS
INTRAVENOUS | Status: DC | PRN
  Administered 2012-02-17: 240 mg via INTRAVENOUS

## 2012-02-17 MED ORDER — CIPROFLOXACIN IN D5W 400 MG/200ML IV SOLN: 400.0000 mg | INTRAVENOUS | Status: DC

## 2012-02-17 MED ORDER — LACTATED RINGERS IV SOLN: INTRAVENOUS | Status: DC

## 2012-02-17 MED ORDER — FENTANYL CITRATE 0.05 MG/ML IJ SOLN: 25.0000 ug | INTRAMUSCULAR | Status: DC | PRN

## 2012-02-17 MED ORDER — BUPIVACAINE HCL 0.25 % IJ SOLN
INTRAMUSCULAR | Status: DC | PRN
  Administered 2012-02-17: 15 mL

## 2012-02-17 MED ORDER — LACTATED RINGERS IV SOLN
INTRAVENOUS | Status: DC
  Administered 2012-02-17 (×2): via INTRAVENOUS
  Administered 2012-02-17: 100 mL/h via INTRAVENOUS

## 2012-02-17 MED ORDER — LIDOCAINE HCL (CARDIAC) 20 MG/ML IV SOLN
INTRAVENOUS | Status: DC | PRN
  Administered 2012-02-17: 80 mg via INTRAVENOUS

## 2012-02-17 MED ORDER — SODIUM CHLORIDE 0.9 % IR SOLN
Status: DC | PRN
  Administered 2012-02-17: 13:00:00

## 2012-02-17 MED ORDER — ONDANSETRON HCL 4 MG/2ML IJ SOLN
INTRAMUSCULAR | Status: DC | PRN
  Administered 2012-02-17: 4 mg via INTRAVENOUS

## 2012-02-17 MED ORDER — PHENYLEPHRINE HCL 10 MG/ML IJ SOLN
INTRAMUSCULAR | Status: DC | PRN
  Administered 2012-02-17: 80 ug via INTRAVENOUS
  Administered 2012-02-17: 40 ug via INTRAVENOUS

## 2012-02-17 MED ORDER — FENTANYL CITRATE 0.05 MG/ML IJ SOLN
25.0000 ug | INTRAMUSCULAR | Status: DC | PRN
  Administered 2012-02-17 (×2): 25 ug via INTRAVENOUS

## 2012-02-17 SURGICAL SUPPLY — 78 items
ADH SKN CLS APL DERMABOND .7 (GAUZE/BANDAGES/DRESSINGS) ×2
APL SKNCLS STERI-STRIP NONHPOA (GAUZE/BANDAGES/DRESSINGS)
BAG DECANTER FOR FLEXI CONT (MISCELLANEOUS) IMPLANT
BALL CTTN LRG ABS STRL LF (GAUZE/BANDAGES/DRESSINGS)
BANDAGE ELASTIC 3 VELCRO ST LF (GAUZE/BANDAGES/DRESSINGS) IMPLANT
BANDAGE ELASTIC 4 VELCRO ST LF (GAUZE/BANDAGES/DRESSINGS) ×3 IMPLANT
BANDAGE ELASTIC 6 VELCRO ST LF (GAUZE/BANDAGES/DRESSINGS) ×3 IMPLANT
BANDAGE GAUZE ELAST BULKY 4 IN (GAUZE/BANDAGES/DRESSINGS) ×3 IMPLANT
BENZOIN TINCTURE PRP APPL 2/3 (GAUZE/BANDAGES/DRESSINGS) IMPLANT
BLADE DERMATOME SS (BLADE) ×1 IMPLANT
BLADE HEX COATED 2.75 (ELECTRODE) IMPLANT
BLADE SURG 10 STRL SS (BLADE) ×1 IMPLANT
BLADE SURG 15 STRL LF DISP TIS (BLADE) IMPLANT
BLADE SURG 15 STRL SS (BLADE) ×3
BLADE SURG ROTATE 9660 (MISCELLANEOUS) IMPLANT
CLOTH BEACON ORANGE TIMEOUT ST (SAFETY) ×3 IMPLANT
COTTONBALL LRG STERILE PKG (GAUZE/BANDAGES/DRESSINGS) IMPLANT
COVER MAYO STAND STRL (DRAPES) ×1 IMPLANT
COVER TABLE BACK 60X90 (DRAPES) ×3 IMPLANT
DECANTER SPIKE VIAL GLASS SM (MISCELLANEOUS) IMPLANT
DERMABOND ADVANCED (GAUZE/BANDAGES/DRESSINGS) ×1
DERMABOND ADVANCED .7 DNX12 (GAUZE/BANDAGES/DRESSINGS) ×2 IMPLANT
DERMACARRIERS GRAFT 1 TO 1.5 (DISPOSABLE)
DRAPE INCISE IOBAN 66X45 STRL (DRAPES) ×3 IMPLANT
DRAPE LAPAROSCOPIC ABDOMINAL (DRAPES) ×1 IMPLANT
DRAPE LG THREE QUARTER DISP (DRAPES) ×3 IMPLANT
DRAPE SURG 17X23 STRL (DRAPES) ×6 IMPLANT
DRSG ADAPTIC 3X8 NADH LF (GAUZE/BANDAGES/DRESSINGS) ×4 IMPLANT
DRSG EMULSION OIL 3X3 NADH (GAUZE/BANDAGES/DRESSINGS) IMPLANT
DRSG PAD ABDOMINAL 8X10 ST (GAUZE/BANDAGES/DRESSINGS) IMPLANT
ELECT REM PT RETURN 9FT ADLT (ELECTROSURGICAL) ×3
ELECTRODE REM PT RTRN 9FT ADLT (ELECTROSURGICAL) ×2 IMPLANT
GAUZE SPONGE 4X4 12PLY STRL LF (GAUZE/BANDAGES/DRESSINGS) ×1 IMPLANT
GAUZE SPONGE 4X4 16PLY XRAY LF (GAUZE/BANDAGES/DRESSINGS) IMPLANT
GLOVE BIO SURGEON STRL SZ 6.5 (GLOVE) ×7 IMPLANT
GOWN PREVENTION PLUS XLARGE (GOWN DISPOSABLE) ×5 IMPLANT
GRAFT DERMACARRIERS 1 TO 1.5 (DISPOSABLE) IMPLANT
NEEDLE 27GAX1X1/2 (NEEDLE) ×2 IMPLANT
NS IRRIG 500ML POUR BTL (IV SOLUTION) ×3 IMPLANT
PACK BASIN DAY SURGERY FS (CUSTOM PROCEDURE TRAY) ×3 IMPLANT
PAD CAST 3X4 CTTN HI CHSV (CAST SUPPLIES) IMPLANT
PAD CAST 4YDX4 CTTN HI CHSV (CAST SUPPLIES) IMPLANT
PADDING CAST ABS 4INX4YD NS (CAST SUPPLIES)
PADDING CAST ABS 6INX4YD NS (CAST SUPPLIES) ×1
PADDING CAST ABS COTTON 4X4 ST (CAST SUPPLIES) IMPLANT
PADDING CAST ABS COTTON 6X4 NS (CAST SUPPLIES) IMPLANT
PADDING CAST COTTON 3X4 STRL (CAST SUPPLIES)
PADDING CAST COTTON 4X4 STRL (CAST SUPPLIES)
PADDING CAST COTTON 6X4 STRL (CAST SUPPLIES) ×2 IMPLANT
PENCIL BUTTON HOLSTER BLD 10FT (ELECTRODE) ×1 IMPLANT
SPLINT FAST PLASTER 5X30 (CAST SUPPLIES) ×1
SPLINT PLASTER CAST FAST 5X30 (CAST SUPPLIES) IMPLANT
SPONGE GAUZE 4X4 12PLY (GAUZE/BANDAGES/DRESSINGS) ×1 IMPLANT
SPONGE LAP 18X18 X RAY DECT (DISPOSABLE) ×4 IMPLANT
STAPLER VISISTAT 35W (STAPLE) ×1 IMPLANT
STOCKINETTE 4X48 STRL (DRAPES) IMPLANT
STOCKINETTE 6  STRL (DRAPES)
STOCKINETTE 6 STRL (DRAPES) IMPLANT
STOCKINETTE IMPERVIOUS LG (DRAPES) IMPLANT
STRIP CLOSURE SKIN 1/2X4 (GAUZE/BANDAGES/DRESSINGS) IMPLANT
SURGILUBE 2OZ TUBE FLIPTOP (MISCELLANEOUS) ×3 IMPLANT
SUT MON AB 5-0 PS2 18 (SUTURE) IMPLANT
SUT SILK 3 0 SH CR/8 (SUTURE) IMPLANT
SUT SILK 4 0 SH CR/8 (SUTURE) IMPLANT
SUT VIC AB 3-0 FS2 27 (SUTURE) IMPLANT
SUT VIC AB 5-0 P-3 18X BRD (SUTURE) IMPLANT
SUT VIC AB 5-0 P3 18 (SUTURE) ×12
SUT VIC AB 5-0 PC1 18 (SUTURE) ×6 IMPLANT
SUT VICRYL 4-0 PS2 18IN ABS (SUTURE) IMPLANT
SYR BULB 3OZ (MISCELLANEOUS) ×3 IMPLANT
SYR CONTROL 10ML LL (SYRINGE) ×3 IMPLANT
Surgical Matrix PSMX ×1 IMPLANT
TIP FLEX 45CM EVICEL (HEMOSTASIS) IMPLANT
TIP RIGID 35CM EVICEL (HEMOSTASIS) IMPLANT
TOWEL OR 17X24 6PK STRL BLUE (TOWEL DISPOSABLE) ×6 IMPLANT
TRAY DSU PREP LF (CUSTOM PROCEDURE TRAY) ×3 IMPLANT
UNDERPAD 30X30 INCONTINENT (UNDERPADS AND DIAPERS) ×3 IMPLANT
WATER STERILE IRR 500ML POUR (IV SOLUTION) ×2 IMPLANT

## 2012-02-17 NOTE — Anesthesia Preprocedure Evaluation (Addendum)
Anesthesia Evaluation  Patient identified by MRN, date of birth, ID band Patient awake    Reviewed: Allergy & Precautions, H&P , NPO status , Patient's Chart, lab work & pertinent test results, reviewed documented beta blocker date and time   Airway Mallampati: III TM Distance: >3 FB Neck ROM: full    Dental  (+) Edentulous Upper, Missing and Dental Advisory Given,    Pulmonary neg pulmonary ROS, shortness of breath and with exertion, Current Smoker,  breath sounds clear to auscultation  Pulmonary exam normal       Cardiovascular Exercise Tolerance: Poor hypertension, Pt. on medications and Pt. on home beta blockers + CAD and + Cardiac Stents Rhythm:regular Rate:Normal  aortofem bypass 2005.  Fem- fem 2006.  DES circumflex 1-/2004.  50% LAD lesion.  Cardiolite 08/2006 inf. Scar. No ischemia. Cardiomyopathy EF now 50% but used to be 35%.  Patient denies CP.   Neuro/Psych negative neurological ROS  negative psych ROS   GI/Hepatic negative GI ROS, Neg liver ROS,   Endo/Other  negative endocrine ROSdiabetes, Well Controlled, Type 2, Insulin Dependentbs 200 this am.  Renal/GU negative Renal ROS  negative genitourinary   Musculoskeletal   Abdominal   Peds  Hematology negative hematology ROS (+) anemia , Hgb. 10.9   Anesthesia Other Findings   Reproductive/Obstetrics negative OB ROS                         Anesthesia Physical Anesthesia Plan  ASA: III  Anesthesia Plan: General   Post-op Pain Management:    Induction: Intravenous  Airway Management Planned: LMA  Additional Equipment:   Intra-op Plan:   Post-operative Plan:   Informed Consent: I have reviewed the patients History and Physical, chart, labs and discussed the procedure including the risks, benefits and alternatives for the proposed anesthesia with the patient or authorized representative who has indicated his/her understanding and  acceptance.   Dental Advisory Given  Plan Discussed with: CRNA and Surgeon  Anesthesia Plan Comments:         Anesthesia Quick Evaluation

## 2012-02-17 NOTE — Transfer of Care (Signed)
Immediate Anesthesia Transfer of Care Note  Patient: Logan [name]  Procedure(s) Performed: Procedure(s) (LRB): IRRIGATION AND DEBRIDEMENT OF WOUND WITH SPLIT THICKNESS SKIN GRAFT (Bilateral) IRRIGATION AND DEBRIDEMENT WOUND (Bilateral)  Patient Location: PACU  Anesthesia Type: General  Level of Consciousness: awake, alert  and oriented  Airway & Oxygen Therapy: Patient Spontanous Breathing and Patient connected to face mask oxygen  Post-op Assessment: Report given to PACU RN and Post -op Vital signs reviewed and stable  Post vital signs: Reviewed and stable  Complications: No apparent anesthesia complications

## 2012-02-17 NOTE — H&P (Signed)
Logan [name] is an 66 y.o. male.   Chief Complaint: Right leg ulcer HPI: The patient is a 66 yrs old male with a right leg ulcer and groin ulcer.  He underwent bypass and then developed increase pressure which required a fasciotomy.  The area has been treated with local care and now ready for further care.  Past Medical History  Diagnosis Date  . Overweight   . Dyslipidemia   . Cardiomyopathy   . Diabetes mellitus   . PVD (peripheral vascular disease)     right to left fem -fem bypass 1997/ aortabifemoral bypass 2005 right to left fem-fem 07/2004. Secondary to occluded righ tlimb of aortobifemoral bypass  . Tobacco abuse   . Anginal pain   . Hypertension   . Shortness of breath   . Coronary artery disease     taxis DES stent circumflex 02/2003.... residual total distal circumflex and 50% LAD/ cardiolite 08/2006 inferior scar no ischemia    Past Surgical History  Procedure Date  . Knee surgery   . Axillary-femoral bypass graft 12/20/2011    Procedure: BYPASS GRAFT AXILLA-BIFEMORAL;  Surgeon: Fransisco Hertz, MD;  Location: [facility] OR;  Service: Vascular;  Laterality: Bilateral;  Left aorta bilateral femoral thrombectomy, femoral to femoral thrombectomy, and revision of Femoral to femoral graft. and Thrombectomy of Left tibial artery.  . Fasciotomy 12/20/2011    Procedure: FASCIOTOMY;  Surgeon: Fransisco Hertz, MD;  Location: [facility] OR;  Service: Vascular;  Laterality: Right;  right calf facsicotomy.  . Lower extremity angiogram 12/20/2011    Procedure: LOWER EXTREMITY ANGIOGRAM;  Surgeon: Fransisco Hertz, MD;  Location: [facility] OR;  Service: Vascular;  Laterality: Bilateral;  Intraoperative Abdominal Aortogram with bilateral runoff  . Left-to-right femoral-femoral bypass graft 07-30-2004  DR LAWSON    OCCLUSION RIGHT LIMB AORTOBIFEMORAL BYPASS GRAFT  W/ SEVERE CLAUDICATION  . Aorto to right common femoral and left profunda femoris bypass graft 09-28-2003 DR LAWSON    SEVERE AORTOILIAC OCCLUSIVE DISEASE W/ OCCLUDED  FEM-FEM BYPASS OF BOTH LOWER EXTREMITIES  . Right lateral parotidectomy with facial verve preservation 02-25-2001  DR [facility]    NEOPLASM RIGHT PAROTID GLAND  . Coronary angioplasty with stent placement 02-23-2003  DR [facility]    PCI W/ STENTING PROXIMAL CIRCUMFLEX  . Femoral-femoral bypass graft 1997  DR LAWSON    RIGHT TO LEFT  . Debridement leg 12/25/11    Family History  Problem Relation Age of Onset  . Hypertension Mother    Social History:  reports that he has been smoking Cigarettes.  He has a 40 pack-year smoking history. He has never used smokeless tobacco. He reports that he drinks alcohol. He reports that he does not use illicit drugs.  Allergies:  Allergies  Allergen Reactions  . Aspirin Hives  . Penicillins Other (See Comments)    Immune system shut down     Medications Prior to Admission  Medication Sig Dispense Refill  . aspirin EC 81 MG tablet Take 81 mg by mouth daily.      . clopidogrel (PLAVIX) 75 MG tablet Take 1 tablet (75 mg total) by mouth daily with breakfast.      . glimepiride (AMARYL) 4 MG tablet Take 4 mg by mouth 2 (two) times daily.      . insulin aspart (NOVOLOG) 100 UNIT/ML injection Inject 0-15 Units into the skin 3 (three) times daily with meals.  1 vial    . insulin glargine (LANTUS) 100 UNIT/ML injection Inject 60 Units into the  skin daily.  10 mL    . lisinopril (PRINIVIL,ZESTRIL) 40 MG tablet Take 40 mg by mouth daily.      . metFORMIN (GLUCOPHAGE) 500 MG tablet Take 1,000 mg by mouth 2 (two) times daily with a meal.      . metoprolol (LOPRESSOR) 50 MG tablet Take 50 mg by mouth 2 (two) times daily.      . Multiple Vitamins-Minerals (MULTIVITAMIN PO) Take 1 tablet by mouth daily.       . Omega-3 Fatty Acids (FISH OIL) 1000 MG CAPS Take 1 capsule by mouth daily.       Marland Kitchen omeprazole (PRILOSEC) 20 MG capsule Take 20 mg by mouth daily.      Marland Kitchen oxyCODONE-acetaminophen (PERCOCET/ROXICET) 5-325 MG per tablet Take 1 tablet by mouth every 4 (four) hours as  needed.      . pantoprazole (PROTONIX) 40 MG tablet Take 1 tablet (40 mg total) by mouth daily at 12 noon.      . simvastatin (ZOCOR) 20 MG tablet Take 20 mg by mouth every evening.      Marland Kitchen Specialty Vitamins Products (PROSTATE PO) Take 30 mLs by mouth daily.      Marland Kitchen acetaminophen (TYLENOL) 325 MG tablet Take 1-2 tablets (325-650 mg total) by mouth every 4 (four) hours as needed (or temp >/= 101 F).      . polyethylene glycol (MIRALAX / GLYCOLAX) packet Take 17 g by mouth daily.        Results for orders placed during the hospital encounter of 02/17/12 (from the past 48 hour(s))  POCT I-STAT 4, (NA,K, GLUC, HGB,HCT)     Status: Abnormal   Collection Time   02/17/12 10:24 AM      Component Value Range Comment   Sodium 141  135 - 145 mEq/L    Potassium 4.2  3.5 - 5.1 mEq/L    Glucose, Bld 202 (*) 70 - 99 mg/dL    HCT 78.2 (*) 95.6 - 52.0 %    Hemoglobin 10.9 (*) 13.0 - 17.0 g/dL    No results found.  Review of Systems  Constitutional: Negative.   HENT: Negative.   Eyes: Negative.   Respiratory: Negative.   Cardiovascular: Negative.   Gastrointestinal: Negative.   Genitourinary: Negative.   Musculoskeletal: Negative.   Skin: Negative.   Psychiatric/Behavioral: Negative.     Blood pressure 124/71, pulse 71, temperature 97.6 F (36.4 C), temperature source Oral, resp. rate 20, height 5\' 9"  (1.753 m), weight 101.606 kg (224 lb), SpO2 97.00%. Physical Exam  Constitutional: He appears well-developed and well-nourished.  HENT:  Head: Normocephalic and atraumatic.  Eyes: Conjunctivae normal and EOM are normal. Pupils are equal, round, and reactive to light.  Cardiovascular: Normal rate.   Respiratory: Effort normal.  GI: Soft.  Neurological: He is alert.  Skin: Skin is warm.  Psychiatric: He has a normal mood and affect. His behavior is normal. Judgment and thought content normal.     Assessment/Plan Right leg ulcer and groin - irrigation and debridement with placement of Acell  and the VAC and possible Skin graft if the area is ready.  Risks and complications were discussed and include bleeding, pain, scar and infection.  SANGER,Kariya Lavergne 02/17/2012, 11:40 AM

## 2012-02-17 NOTE — Anesthesia Postprocedure Evaluation (Signed)
  Anesthesia Post-op Note  Patient: Logan [name]  Procedure(s) Performed: Procedure(s) (LRB): IRRIGATION AND DEBRIDEMENT OF WOUND WITH SPLIT THICKNESS SKIN GRAFT (Bilateral) IRRIGATION AND DEBRIDEMENT WOUND (Bilateral)  Patient Location: PACU  Anesthesia Type: General  Level of Consciousness: awake and alert   Airway and Oxygen Therapy: Patient Spontanous Breathing  Post-op Pain: mild  Post-op Assessment: Post-op Vital signs reviewed, Patient's Cardiovascular Status Stable, Respiratory Function Stable, Patent Airway and No signs of Nausea or vomiting  Post-op Vital Signs: stable  Complications: No apparent anesthesia complications

## 2012-02-17 NOTE — Anesthesia Procedure Notes (Signed)
Procedure Name: LMA Insertion Date/Time: 02/17/2012 11:59 AM Performed by: Fran Lowes Pre-anesthesia Checklist: Patient identified, Emergency Drugs available, Suction available and Patient being monitored Patient Re-evaluated:Patient Re-evaluated prior to inductionOxygen Delivery Method: Circle System Utilized Preoxygenation: Pre-oxygenation with 100% oxygen Intubation Type: IV induction Ventilation: Mask ventilation without difficulty LMA: LMA inserted LMA Size: 5.0 Number of attempts: 1 Airway Equipment and Method: bite block Placement Confirmation: positive ETCO2 Tube secured with: Tape Dental Injury: Teeth and Oropharynx as per pre-operative assessment

## 2012-02-17 NOTE — Brief Op Note (Signed)
02/17/2012  12:56 PM  PATIENT:  Logan [name] Comment  66 y.o. male  PRE-OPERATIVE DIAGNOSIS:  BILATERAL GROIN WOUND  POST-OPERATIVE DIAGNOSIS:  BILATERAL GROIN WOUND  PROCEDURE:  Procedure(s) (LRB) with comments: IRRIGATION AND DEBRIDEMENT OF WOUND WITH SPLIT THICKNESS SKIN GRAFT (Bilateral) - STSG TO RIGHT LOWER EXTREMITY FASCIOTOMY SITE   IRRIGATION AND DEBRIDEMENT WOUND (Bilateral)  SURGEON:  Surgeon(s) and Role:    * Dea Bitting Sanger, DO - Primary  PHYSICIAN ASSISTANT: Shawn Rayburn, PA  ASSISTANTS: none   ANESTHESIA:   local and general  EBL:  Total I/O In: 1000 [I.V.:1000] Out: -   BLOOD ADMINISTERED:none  DRAINS: none   LOCAL MEDICATIONS USED:  MARCAINE     SPECIMEN:  No Specimen  DISPOSITION OF SPECIMEN:  N/A  COUNTS:  YES  TOURNIQUET:  * No tourniquets in log *  DICTATION: dictated  PLAN OF CARE: discharge to nursing facility  PATIENT DISPOSITION:  PACU - hemodynamically stable.   Delay start of Pharmacological VTE agent (>24hrs) due to surgical blood loss or risk of bleeding: no

## 2012-02-18 ENCOUNTER — Encounter (HOSPITAL_BASED_OUTPATIENT_CLINIC_OR_DEPARTMENT_OTHER): Payer: Self-pay | Admitting: Plastic Surgery

## 2012-02-18 LAB — GLUCOSE, CAPILLARY

## 2012-02-18 NOTE — Op Note (Signed)
NAME:  Logan [name], Logan [name] NO.:  [id number]  MEDICAL RECORD NO.:  [id number]  LOCATION:  S117                          FACILITY:  [facility]  PHYSICIAN:  Wayland Denis, DO      DATE OF BIRTH:  [birth year]  DATE OF PROCEDURE:  02/17/2012 DATE OF DISCHARGE:  02/17/2012                              OPERATIVE REPORT   PREOPERATIVE DIAGNOSIS:  Right lower extremity ulcer and bilateral groin ulcers.  POSTOPERATIVE DIAGNOSIS:  Right lower extremity ulcer and bilateral groin ulcers.  PROCEDURE:  Irrigation of right lower extremity ulcer and bilateral groin ulcers for purpose of preparation for split-thickness skin graft to right leg and ACell placement to groins, VAC placement to right leg. ACell sheet utilized.  SURGEON:  Wayland Denis, DO  ASSISTANT:  Lazaro Arms, P.A.  ANESTHESIA:  General.  INDICATION FOR PROCEDURE:  The patient is a 66 year old male who had surgical procedure which resulted in an ulcer of his right lower extremity.  He has undergone debridement and local care in the office with excellent improvement.  The decision was made to bring him to the operating room for definitive closure.  Risks and complications were reviewed which included bleeding, pain, scar, risk of anesthesia, infection, and scar at the donor site.  Consent was signed and confirmed.  DESCRIPTION OF PROCEDURE:  The patient was taken to the operating room, placed on the operating room table in a supine position.  General anesthesia was administered.  Once adequate, a time-out was called.  All information was confirmed to be correct.  He was prepped and draped in usual sterile fashion.  All the ulcers were irrigated with antibiotic solution.  A 10-blade was used to debride the skin and subcutaneous tissue off the ulcer.  The right leg ulcer was 18 x 5 cm and the groin ulcer was 2 x 1 cm.  0.25% bupivacaine with epinephrine was injected in the right upper thigh area for the donor site  for intraoperative hemostasis and postop pain management.  The dermatome was set at 04/999 of an inch and skin was obtained from the right upper thigh.  ACell was prepared according to the manufacturer's guidelines and pie crusting was done and the ACell was placed on the donor site.  The ACell was placed after seal was sprayed on the site.  Seal was then sprayed on the recipient site as well as the skin graft was placed on top and sutures were placed with 5-0 Vicryl.  Adaptic was placed over the skin graft and the ACell.  The VAC was placed over the Adaptic with skin graft at 125 mmHg pressure continuous.  Hydrogel was placed over the Adaptic gauze and ACell of the right upper and Tegaderm was applied as the dressing. The base of the skin was then sutured in place over the right and left groin and Hydrogel was placed with Tegaderm.  A posterior blocking splint was placed on the right side to help keep it stable and for improvement at the site.  ACE wraps were placed with splint in place.  The patient tolerated the procedure well.  There were no complications.  He was awoken and taken  to recovery room in stable condition.     Wayland Denis, DO     CS/MEDQ  D:  02/17/2012  T:  02/18/2012  Job:  [id number]

## 2012-02-19 LAB — GLUCOSE, CAPILLARY
Glucose-Capillary: 266 mg/dL — ABNORMAL HIGH (ref 70–99)
Glucose-Capillary: 172 mg/dL — ABNORMAL HIGH (ref 70–99)

## 2012-02-20 LAB — GLUCOSE, CAPILLARY: Glucose-Capillary: 161 mg/dL — ABNORMAL HIGH (ref 70–99)

## 2012-02-21 LAB — GLUCOSE, CAPILLARY
Glucose-Capillary: 148 mg/dL — ABNORMAL HIGH (ref 70–99)
Glucose-Capillary: 149 mg/dL — ABNORMAL HIGH (ref 70–99)

## 2012-02-24 ENCOUNTER — Encounter (HOSPITAL_BASED_OUTPATIENT_CLINIC_OR_DEPARTMENT_OTHER): Payer: Medicare Other | Attending: Plastic Surgery

## 2012-02-24 DIAGNOSIS — L98499 Non-pressure chronic ulcer of skin of other sites with unspecified severity: Secondary | ICD-10-CM | POA: Insufficient documentation

## 2012-02-24 DIAGNOSIS — L97809 Non-pressure chronic ulcer of other part of unspecified lower leg with unspecified severity: Secondary | ICD-10-CM | POA: Insufficient documentation

## 2012-02-26 LAB — GLUCOSE, CAPILLARY
Glucose-Capillary: 195 mg/dL — ABNORMAL HIGH (ref 70–99)
Glucose-Capillary: 266 mg/dL — ABNORMAL HIGH (ref 70–99)
Glucose-Capillary: 175 mg/dL — ABNORMAL HIGH (ref 70–99)
Glucose-Capillary: 211 mg/dL — ABNORMAL HIGH (ref 70–99)

## 2012-02-27 LAB — GLUCOSE, CAPILLARY
Glucose-Capillary: 118 mg/dL — ABNORMAL HIGH (ref 70–99)
Glucose-Capillary: 261 mg/dL — ABNORMAL HIGH (ref 70–99)

## 2012-02-28 LAB — GLUCOSE, CAPILLARY: Glucose-Capillary: 194 mg/dL — ABNORMAL HIGH (ref 70–99)

## 2012-03-01 LAB — GLUCOSE, CAPILLARY
Glucose-Capillary: 242 mg/dL — ABNORMAL HIGH (ref 70–99)
Glucose-Capillary: 232 mg/dL — ABNORMAL HIGH (ref 70–99)

## 2012-03-02 NOTE — Progress Notes (Signed)
[facility]  NAME:  Logan [name], GEE NO.:  [id number]  MEDICAL RECORD NO.:  [id number]      DATE OF BIRTH:  1945-11-10  PHYSICIAN:  Wayland Denis, DO       VISIT DATE:  03/02/2012                                  OFFICE VISIT   The patient is a 66 year old gentleman who is here for followup on his right lower extremity irrigation and debridement, placement of a split- thickness skin graft, and A-Cell to the donor site.  He is doing extremely well.  We took the [facility] off.  He looks like he has this point a full take.  The groins are looking better.  The left is almost closed, the right has a little hypergranulation.  We used a little silver nitrate on the right groin area with Xeroform on the right leg, Endoform on both groins, and follow up in a week.  Continue with the splint and discharge from nursing facility per primary doctors.     Wayland Denis, DO     CS/MEDQ  D:  03/02/2012  T:  03/02/2012  Job:  [id number]

## 2012-03-09 NOTE — Progress Notes (Signed)
[facility]  NAME:  Logan [name], Logan [name] NO.:  [id number]  MEDICAL RECORD NO.:  [id number]      DATE OF BIRTH:  [birth year]  PHYSICIAN:  Wayland Denis, DO            VISIT DATE:                                  OFFICE VISIT   The patient is a 66 year old gentleman, who is here for followup on his right lower extremity ulcer.  He underwent irrigation, debridement, and placement of skin graft 2 weeks ago.  He also has bilateral groin wound. Overall he is doing very well.  There is about a 95% take of his graft. On the distal portion, there is a little bit of loss but he does seem to be epithelializing.  We will use Endoform on any of the open area and then closed area Xeroform.  Continue with Endoform on the groin and have him continue with the splint and follow up in a week.     Wayland Denis, DO     CS/MEDQ  D:  03/09/2012  T:  03/09/2012  Job:  [id number]

## 2012-03-17 ENCOUNTER — Telehealth: Payer: Self-pay

## 2012-03-17 NOTE — Telephone Encounter (Signed)
Pt. Called to report 2 episodes of bleeding from left groin; went to the ER on 10/27 @ [facility].  Stated "they stitched the groin, and told me to contact Dr. Imogene Burn to check it".  Also states he saw Dr. Kelly Splinter yesterday and a dry dsg. was applied, and told to get appt. with Dr. Imogene Burn ASAP.  Pt. stated has intermittent drainage, but denies that the area is actively bleeding.  Appt. given for 03/20/12 at 10:15 AM.  Pt. agrees with plan.  Encouraged to keep clean, dry dsg. on left groin.  Advised to call office or go to the ER if symptoms worsen prior to 03/20/12. Verb. Understanding.

## 2012-03-17 NOTE — Progress Notes (Signed)
[facility]  NAME:  Logan [name], GOUGH NO.:  [id number]  MEDICAL RECORD NO.:  [id number]      DATE OF BIRTH:  March 06, 1946  PHYSICIAN:  Wayland Denis, DO            VISIT DATE:                                  OFFICE VISIT   The patient is a 66 year old gentleman, who is here for followup on his right lower extremity ulcer and bilateral groin ulcers.  Unfortunately he had an episode yesterday in which he had quite a bit of bleeding from the right groin area.  He was taken to the ED and the area was tied off. The Prolene sutures are still visible but they are superficial.  We are going to get him an appointment with his vascular doctor, Dr. Imogene Burn for further evaluation of that side.  In the meantime, his graft looks markedly improved as well as his right groin.  We will continue with [place] and see him back in a week.     Wayland Denis, DO     CS/MEDQ  D:  03/16/2012  T:  03/17/2012  Job:  [id number]

## 2012-03-19 ENCOUNTER — Encounter: Payer: Self-pay | Admitting: Vascular Surgery

## 2012-03-20 ENCOUNTER — Encounter: Payer: Self-pay | Admitting: Vascular Surgery

## 2012-03-20 ENCOUNTER — Other Ambulatory Visit: Payer: Self-pay

## 2012-03-20 ENCOUNTER — Encounter (HOSPITAL_COMMUNITY): Payer: Self-pay | Admitting: Family Medicine

## 2012-03-20 ENCOUNTER — Ambulatory Visit (INDEPENDENT_AMBULATORY_CARE_PROVIDER_SITE_OTHER): Payer: Medicare Other | Admitting: Vascular Surgery

## 2012-03-20 ENCOUNTER — Inpatient Hospital Stay (HOSPITAL_COMMUNITY)
Admission: EM | Admit: 2012-03-20 | Discharge: 2012-03-31 | DRG: 920 | Disposition: A | Discharge status: Home / Self Care | Payer: Medicare Other | Attending: Vascular Surgery | Admitting: Vascular Surgery

## 2012-03-20 ENCOUNTER — Emergency Department (HOSPITAL_COMMUNITY): Payer: Medicare Other

## 2012-03-20 VITALS — BP 166/77 | HR 79 | Temp 98.8°F | Ht 69.0 in | Wt 233.0 lb

## 2012-03-20 DIAGNOSIS — T827XXA Infection and inflammatory reaction due to other cardiac and vascular devices, implants and grafts, initial encounter: Secondary | ICD-10-CM

## 2012-03-20 DIAGNOSIS — E785 Hyperlipidemia, unspecified: Secondary | ICD-10-CM | POA: Diagnosis present

## 2012-03-20 DIAGNOSIS — I2510 Atherosclerotic heart disease of native coronary artery without angina pectoris: Secondary | ICD-10-CM | POA: Diagnosis present

## 2012-03-20 DIAGNOSIS — Z794 Long term (current) use of insulin: Secondary | ICD-10-CM

## 2012-03-20 DIAGNOSIS — I739 Peripheral vascular disease, unspecified: Secondary | ICD-10-CM | POA: Diagnosis present

## 2012-03-20 DIAGNOSIS — R58 Hemorrhage, not elsewhere classified: Secondary | ICD-10-CM

## 2012-03-20 DIAGNOSIS — Z87891 Personal history of nicotine dependence: Secondary | ICD-10-CM

## 2012-03-20 DIAGNOSIS — IMO0002 Reserved for concepts with insufficient information to code with codable children: Secondary | ICD-10-CM

## 2012-03-20 DIAGNOSIS — I10 Essential (primary) hypertension: Secondary | ICD-10-CM | POA: Diagnosis present

## 2012-03-20 DIAGNOSIS — M79609 Pain in unspecified limb: Secondary | ICD-10-CM

## 2012-03-20 DIAGNOSIS — T819XXA Unspecified complication of procedure, initial encounter: Secondary | ICD-10-CM

## 2012-03-20 DIAGNOSIS — I70219 Atherosclerosis of native arteries of extremities with intermittent claudication, unspecified extremity: Secondary | ICD-10-CM

## 2012-03-20 DIAGNOSIS — D62 Acute posthemorrhagic anemia: Secondary | ICD-10-CM | POA: Diagnosis present

## 2012-03-20 DIAGNOSIS — R1032 Left lower quadrant pain: Secondary | ICD-10-CM

## 2012-03-20 DIAGNOSIS — Y849 Medical procedure, unspecified as the cause of abnormal reaction of the patient, or of later complication, without mention of misadventure at the time of the procedure: Secondary | ICD-10-CM | POA: Diagnosis present

## 2012-03-20 DIAGNOSIS — E119 Type 2 diabetes mellitus without complications: Secondary | ICD-10-CM | POA: Diagnosis present

## 2012-03-20 DIAGNOSIS — E663 Overweight: Secondary | ICD-10-CM | POA: Diagnosis present

## 2012-03-20 LAB — POCT I-STAT, CHEM 8
Chloride: 106 mEq/L (ref 96–112)
Creatinine, Ser: 1.2 mg/dL (ref 0.50–1.35)
Glucose, Bld: 124 mg/dL — ABNORMAL HIGH (ref 70–99)
HCT: 25 % — ABNORMAL LOW (ref 39.0–52.0)
Potassium: 4.2 mEq/L (ref 3.5–5.1)
Sodium: 143 mEq/L (ref 135–145)

## 2012-03-20 LAB — URINALYSIS, ROUTINE W REFLEX MICROSCOPIC
Bilirubin Urine: NEGATIVE
Nitrite: NEGATIVE
Specific Gravity, Urine: 1.007 (ref 1.005–1.030)
pH: 7 (ref 5.0–8.0)

## 2012-03-20 LAB — URINE MICROSCOPIC-ADD ON

## 2012-03-20 MED ORDER — GLIMEPIRIDE 4 MG PO TABS
4.0000 mg | ORAL_TABLET | Freq: Two times a day (BID) | ORAL | Status: DC
  Administered 2012-03-20 – 2012-03-26 (×12): 4 mg via ORAL
  Filled 2012-03-20 (×13): qty 1

## 2012-03-20 MED ORDER — DEXTROSE 50 % IV SOLN
1.0000 | Freq: Once | INTRAVENOUS | Status: AC
  Administered 2012-03-20: 50 mL via INTRAVENOUS
  Filled 2012-03-20: qty 50

## 2012-03-20 MED ORDER — OMEGA-3-ACID ETHYL ESTERS 1 G PO CAPS
1.0000 g | ORAL_CAPSULE | Freq: Every day | ORAL | Status: DC
  Administered 2012-03-21 – 2012-03-30 (×10): 1 g via ORAL
  Filled 2012-03-20 (×12): qty 1

## 2012-03-20 MED ORDER — OXYCODONE-ACETAMINOPHEN 5-325 MG PO TABS: 1.0000 | ORAL_TABLET | ORAL | Status: DC | PRN

## 2012-03-20 MED ORDER — MORPHINE SULFATE 2 MG/ML IJ SOLN: 2.0000 mg | INTRAMUSCULAR | Status: DC | PRN

## 2012-03-20 MED ORDER — INSULIN ASPART 100 UNIT/ML ~~LOC~~ SOLN
0.0000 [IU] | Freq: Three times a day (TID) | SUBCUTANEOUS | Status: DC
  Administered 2012-03-21: 8 [IU] via SUBCUTANEOUS
  Administered 2012-03-22 (×3): 5 [IU] via SUBCUTANEOUS
  Administered 2012-03-23 – 2012-03-24 (×4): 3 [IU] via SUBCUTANEOUS
  Administered 2012-03-24: 8 [IU] via SUBCUTANEOUS
  Administered 2012-03-25: 5 [IU] via SUBCUTANEOUS
  Administered 2012-03-25: 8 [IU] via SUBCUTANEOUS
  Administered 2012-03-25: 5 [IU] via SUBCUTANEOUS
  Administered 2012-03-26: 11 [IU] via SUBCUTANEOUS
  Administered 2012-03-26 – 2012-03-27 (×3): 5 [IU] via SUBCUTANEOUS
  Administered 2012-03-27: 17:00:00 via SUBCUTANEOUS
  Administered 2012-03-28: 8 [IU] via SUBCUTANEOUS
  Administered 2012-03-28: 5 [IU] via SUBCUTANEOUS
  Administered 2012-03-28: 8 [IU] via SUBCUTANEOUS
  Administered 2012-03-29: 5 [IU] via SUBCUTANEOUS
  Administered 2012-03-29 – 2012-03-30 (×2): 8 [IU] via SUBCUTANEOUS
  Administered 2012-03-30 – 2012-03-31 (×3): 11 [IU] via SUBCUTANEOUS

## 2012-03-20 MED ORDER — ACETAMINOPHEN 325 MG RE SUPP
325.0000 mg | RECTAL | Status: DC | PRN
  Filled 2012-03-20: qty 2

## 2012-03-20 MED ORDER — SENNA 8.6 MG PO TABS
1.0000 | ORAL_TABLET | Freq: Every day | ORAL | Status: DC | PRN
  Filled 2012-03-20: qty 1

## 2012-03-20 MED ORDER — AMLODIPINE BESYLATE 2.5 MG PO TABS
2.5000 mg | ORAL_TABLET | Freq: Every day | ORAL | Status: DC
  Administered 2012-03-21 – 2012-03-30 (×10): 2.5 mg via ORAL
  Filled 2012-03-20 (×12): qty 1

## 2012-03-20 MED ORDER — PANTOPRAZOLE SODIUM 40 MG PO TBEC
40.0000 mg | DELAYED_RELEASE_TABLET | Freq: Every day | ORAL | Status: DC
  Administered 2012-03-21 – 2012-03-30 (×10): 40 mg via ORAL
  Filled 2012-03-20 (×10): qty 1

## 2012-03-20 MED ORDER — SODIUM CHLORIDE 0.9 % IV SOLN
500.0000 mg | Freq: Four times a day (QID) | INTRAVENOUS | Status: DC
  Administered 2012-03-21 – 2012-03-24 (×13): 500 mg via INTRAVENOUS
  Filled 2012-03-20 (×17): qty 500

## 2012-03-20 MED ORDER — GUAIFENESIN-DM 100-10 MG/5ML PO SYRP: 15.0000 mL | ORAL_SOLUTION | ORAL | Status: DC | PRN

## 2012-03-20 MED ORDER — OXYCODONE HCL 5 MG PO TABS: 5.0000 mg | ORAL_TABLET | ORAL | Status: DC | PRN

## 2012-03-20 MED ORDER — METOPROLOL TARTRATE 1 MG/ML IV SOLN: 2.0000 mg | INTRAVENOUS | Status: DC | PRN

## 2012-03-20 MED ORDER — METOPROLOL TARTRATE 50 MG PO TABS
50.0000 mg | ORAL_TABLET | Freq: Two times a day (BID) | ORAL | Status: DC
  Administered 2012-03-20 – 2012-03-30 (×21): 50 mg via ORAL
  Filled 2012-03-20 (×23): qty 1

## 2012-03-20 MED ORDER — CLOPIDOGREL BISULFATE 75 MG PO TABS
75.0000 mg | ORAL_TABLET | Freq: Every day | ORAL | Status: DC
  Administered 2012-03-21 – 2012-03-30 (×10): 75 mg via ORAL
  Filled 2012-03-20 (×13): qty 1

## 2012-03-20 MED ORDER — ACETAMINOPHEN 325 MG PO TABS
325.0000 mg | ORAL_TABLET | ORAL | Status: DC | PRN
  Filled 2012-03-20: qty 2

## 2012-03-20 MED ORDER — HYDRALAZINE HCL 20 MG/ML IJ SOLN
10.0000 mg | INTRAMUSCULAR | Status: DC | PRN
  Filled 2012-03-20: qty 0.5

## 2012-03-20 MED ORDER — TEMAZEPAM 15 MG PO CAPS: 15.0000 mg | ORAL_CAPSULE | Freq: Every evening | ORAL | Status: DC | PRN

## 2012-03-20 MED ORDER — LISINOPRIL 40 MG PO TABS
40.0000 mg | ORAL_TABLET | Freq: Every day | ORAL | Status: DC
  Administered 2012-03-21 – 2012-03-30 (×10): 40 mg via ORAL
  Filled 2012-03-20 (×11): qty 1

## 2012-03-20 MED ORDER — VANCOMYCIN HCL IN DEXTROSE 1-5 GM/200ML-% IV SOLN
1000.0000 mg | Freq: Two times a day (BID) | INTRAVENOUS | Status: DC
  Administered 2012-03-21 – 2012-03-24 (×7): 1000 mg via INTRAVENOUS
  Filled 2012-03-20 (×8): qty 200

## 2012-03-20 MED ORDER — ONDANSETRON HCL 4 MG/2ML IJ SOLN: 4.0000 mg | Freq: Four times a day (QID) | INTRAMUSCULAR | Status: DC | PRN

## 2012-03-20 MED ORDER — VANCOMYCIN HCL IN DEXTROSE 1-5 GM/200ML-% IV SOLN: 1000.0000 mg | Freq: Two times a day (BID) | INTRAVENOUS | Status: DC

## 2012-03-20 MED ORDER — VANCOMYCIN HCL 1000 MG IV SOLR
2000.0000 mg | Freq: Once | INTRAVENOUS | Status: DC
  Filled 2012-03-20: qty 2000

## 2012-03-20 MED ORDER — INDOMETHACIN 50 MG PO CAPS
50.0000 mg | ORAL_CAPSULE | Freq: Three times a day (TID) | ORAL | Status: DC
  Administered 2012-03-21 – 2012-03-30 (×29): 50 mg via ORAL
  Filled 2012-03-20 (×36): qty 1

## 2012-03-20 MED ORDER — ALUM & MAG HYDROXIDE-SIMETH 200-200-20 MG/5ML PO SUSP: 15.0000 mL | ORAL | Status: DC | PRN

## 2012-03-20 MED ORDER — PIOGLITAZONE HCL 30 MG PO TABS
30.0000 mg | ORAL_TABLET | Freq: Every day | ORAL | Status: DC
  Administered 2012-03-28 – 2012-03-29 (×2): 30 mg via ORAL
  Filled 2012-03-20 (×11): qty 1

## 2012-03-20 MED ORDER — ASPIRIN EC 81 MG PO TBEC
81.0000 mg | DELAYED_RELEASE_TABLET | Freq: Every day | ORAL | Status: DC
  Administered 2012-03-21 – 2012-03-30 (×10): 81 mg via ORAL
  Filled 2012-03-20 (×12): qty 1

## 2012-03-20 MED ORDER — SIMVASTATIN 20 MG PO TABS
20.0000 mg | ORAL_TABLET | Freq: Every evening | ORAL | Status: DC
  Administered 2012-03-21 – 2012-03-30 (×10): 20 mg via ORAL
  Filled 2012-03-20 (×12): qty 1

## 2012-03-20 MED ORDER — LABETALOL HCL 5 MG/ML IV SOLN
10.0000 mg | INTRAVENOUS | Status: DC | PRN
  Filled 2012-03-20: qty 4

## 2012-03-20 MED ORDER — IOHEXOL 300 MG/ML  SOLN
100.0000 mL | Freq: Once | INTRAMUSCULAR | Status: AC | PRN
  Administered 2012-03-20: 100 mL via INTRAVENOUS

## 2012-03-20 MED ORDER — INSULIN GLARGINE 100 UNIT/ML ~~LOC~~ SOLN
60.0000 [IU] | Freq: Every day | SUBCUTANEOUS | Status: DC
  Administered 2012-03-21 – 2012-03-30 (×9): 60 [IU] via SUBCUTANEOUS

## 2012-03-20 MED ORDER — POTASSIUM CHLORIDE CRYS ER 20 MEQ PO TBCR: 20.0000 meq | EXTENDED_RELEASE_TABLET | Freq: Once | ORAL | Status: DC

## 2012-03-20 MED ORDER — PHENOL 1.4 % MT LIQD
1.0000 | OROMUCOSAL | Status: DC | PRN
  Filled 2012-03-20: qty 177

## 2012-03-20 NOTE — ED Provider Notes (Signed)
History     CSN: [id number]  Arrival date & time 03/20/12  1126   First MD Initiated Contact with Patient 03/20/12 1143      Chief Complaint  Patient presents with  . Circulatory Problem     HPI Patient sent to the emergency room from Dr. Levin Bacon office for CT scan and workup of possible aortic fistula.  Patient had femoral-femoral bypass graft done recently on the legs.  He's been having some bleeding for the 2-3 days in the left terminal area.  Bleeding is now stopped.  At its height it is quite profuse.  Patient was seen by Dr. Imogene Burn who sent him here for further evaluation and testing. Past Medical History  Diagnosis Date  . Overweight   . Dyslipidemia   . Cardiomyopathy   . Diabetes mellitus   . PVD (peripheral vascular disease)     right to left fem -fem bypass 1997/ aortabifemoral bypass 2005 right to left fem-fem 07/2004. Secondary to occluded righ tlimb of aortobifemoral bypass  . Tobacco abuse   . Anginal pain   . Hypertension   . Shortness of breath   . Coronary artery disease     taxis DES stent circumflex 02/2003.... residual total distal circumflex and 50% LAD/ cardiolite 08/2006 inferior scar no ischemia    Past Surgical History  Procedure Date  . Knee surgery   . Axillary-femoral bypass graft 12/20/2011    Procedure: BYPASS GRAFT AXILLA-BIFEMORAL;  Surgeon: Fransisco Hertz, MD;  Location: [facility] OR;  Service: Vascular;  Laterality: Bilateral;  Left aorta bilateral femoral thrombectomy, femoral to femoral thrombectomy, and revision of Femoral to femoral graft. and Thrombectomy of Left tibial artery.  . Fasciotomy 12/20/2011    Procedure: FASCIOTOMY;  Surgeon: Fransisco Hertz, MD;  Location: [facility] OR;  Service: Vascular;  Laterality: Right;  right calf facsicotomy.  . Lower extremity angiogram 12/20/2011    Procedure: LOWER EXTREMITY ANGIOGRAM;  Surgeon: Fransisco Hertz, MD;  Location: [facility] OR;  Service: Vascular;  Laterality: Bilateral;  Intraoperative Abdominal Aortogram with bilateral  runoff  . Left-to-right femoral-femoral bypass graft 07-30-2004  DR LAWSON    OCCLUSION RIGHT LIMB AORTOBIFEMORAL BYPASS GRAFT  W/ SEVERE CLAUDICATION  . Aorto to right common femoral and left profunda femoris bypass graft 09-28-2003 DR LAWSON    SEVERE AORTOILIAC OCCLUSIVE DISEASE W/ OCCLUDED FEM-FEM BYPASS OF BOTH LOWER EXTREMITIES  . Right lateral parotidectomy with facial verve preservation 02-25-2001  DR [facility]    NEOPLASM RIGHT PAROTID GLAND  . Coronary angioplasty with stent placement 02-23-2003  DR [facility]    PCI W/ STENTING PROXIMAL CIRCUMFLEX  . Femoral-femoral bypass graft 1997  DR LAWSON    RIGHT TO LEFT  . Debridement leg 12/25/11  . Incision and drainage of wound 02/17/2012    Procedure: IRRIGATION AND DEBRIDEMENT WOUND;  Surgeon: Wayland Denis, DO;  Location: [facility];  Service: Plastics;  Laterality: Bilateral;    Family History  Problem Relation Age of Onset  . Hypertension Mother     History  Substance Use Topics  . Smoking status: Former Smoker -- 40 years    Types: Cigarettes    Quit date: 12/20/2011  . Smokeless tobacco: Never Used  . Alcohol Use: Yes     liquior sometimes      Review of Systems  All other systems reviewed and are negative.    Allergies  Aspirin and Penicillins  Home Medications   No current outpatient prescriptions on file.  BP 134/63  Pulse 91  Temp 98 F (36.7 C) (Oral)  Resp 16  Ht 5' 8.9" (1.75 m)  Wt 233 lb 0.4 oz (105.7 kg)  BMI 34.51 kg/m2  SpO2 95%  Physical Exam  Nursing note and vitals reviewed. Constitutional: He is oriented to person, place, and time. He appears well-developed and well-nourished. No distress.  HENT:  Head: Normocephalic and atraumatic.  Eyes: Pupils are equal, round, and reactive to light.  Neck: Normal range of motion.  Cardiovascular: Normal rate and intact distal pulses.   Pulmonary/Chest: No respiratory distress.  Abdominal: Normal appearance. He exhibits no  distension. There is no tenderness. There is no rebound.  Genitourinary:     Musculoskeletal: Normal range of motion.  Neurological: He is alert and oriented to person, place, and time. No cranial nerve deficit.  Skin: Skin is warm and dry. No rash noted.  Psychiatric: He has a normal mood and affect. His behavior is normal.    ED Course  Procedures (including critical care time)  Labs Reviewed  POCT I-STAT, CHEM 8 - Abnormal; Notable for the following:    Glucose, Bld 124 (*)     Hemoglobin 8.5 (*)     HCT 25.0 (*)     All other components within normal limits  GLUCOSE, CAPILLARY - Abnormal; Notable for the following:    Glucose-Capillary 58 (*)     All other components within normal limits  CBC - Abnormal; Notable for the following:    RBC 2.56 (*)     Hemoglobin 7.2 (*)     HCT 22.4 (*)     All other components within normal limits  URINALYSIS, ROUTINE W REFLEX MICROSCOPIC - Abnormal; Notable for the following:    Leukocytes, UA TRACE (*)     All other components within normal limits  HEMOGLOBIN A1C - Abnormal; Notable for the following:    Hemoglobin A1C 7.5 (*)     Mean Plasma Glucose 169 (*)     All other components within normal limits  BASIC METABOLIC PANEL - Abnormal; Notable for the following:    Glucose, Bld 110 (*)     GFR calc non Af Amer 62 (*)     GFR calc Af Amer 72 (*)     All other components within normal limits  GLUCOSE, CAPILLARY - Abnormal; Notable for the following:    Glucose-Capillary 210 (*)     All other components within normal limits  GLUCOSE, CAPILLARY - Abnormal; Notable for the following:    Glucose-Capillary 118 (*)     All other components within normal limits  GLUCOSE, CAPILLARY - Abnormal; Notable for the following:    Glucose-Capillary 215 (*)     All other components within normal limits  URINE MICROSCOPIC-ADD ON   Ct Abdomen Pelvis W Contrast  03/20/2012  *RADIOLOGY REPORT*  Clinical Data: Left groin fistula.  Bleeding.   History of left to right femoral heights and femoral bypass grafting and aorta to the right common femoral and left profunda femoris bypass.  CT ABDOMEN AND PELVIS WITH CONTRAST  Technique:  Multidetector CT imaging of the abdomen and pelvis was performed following the standard protocol during bolus administration of intravenous contrast.  Contrast: OMNIPAQUE IOHEXOL 300 MG/ML  SOLN  Comparison: None  Findings: Calcifications involving the mitral valve identified.  Heart size is within normal limits.  There is no pericardial or pleural effusion.  There is a pulmonary nodule in the right base measuring 4.4 mm, image 5.  There is  no new liver abnormality.  The gallbladder is normal.  The pancreas is unremarkable.  Normal appearance of the spleen.  Both of the adrenal glands are within normal limits.  The kidneys are unremarkable.  Normal appearance of the urinary bladder. Prostate gland is enlarged measuring 5.2 cm in transverse dimension.  Normal appearance of the seminal vesicles.  There is no adenopathy within the upper abdomen.  There is no pelvic or inguinal adenopathy identified.  Postsurgical changes involving the abdominal aorta identified consistent with an aorto to left profunda femoral bypass grafting. The aorto to right femoral bypass graft is occluded.  A left to right femoral-femoral bypass graft is identified and appears patent.  The proximal portions of both femoral arteries appear to enhance and are patent.  Postsurgical change are noted within the left groin region.  There is no evidence for hematoma or pseudoaneurysm.  There may be a small pseudoaneurysm within the right groin region which measures approximately 7 mm, image 46 of the coronal series.  No fluid collections identified within the groin regions bilaterally.  There is no free fluid or fluid collections within the abdomen or pelvis.  The stomach and the small bowel loops appear within normal limits. The appendix is visualized and  appears normal.  Several distal colonic diverticula noted without acute inflammation.  Review of the visualized bony structures is significant for lumbar degenerative disc disease.  IMPRESSION:  1.  Patent aorto to the left profunda femoris bypass graft.  The aorto to right common femoral graft appears occluded. 2.  Patent left to right femoral head and femoral bypass graft. 3.  Suspect small pseudo aneurysm within the right groin measuring 7 mm. 4.  4 mm pulmonary nodule is identified in the right base. If the patient is at high risk for bronchogenic carcinoma, follow-up chest CT at 1 year is recommended.  If the patient is at low risk, no follow-up is needed.  This recommendation follows the consensus statement: Guidelines for Management of Small Pulmonary Nodules Detected on CT Scans:  A Statement from the Fleischner Society as published in Radiology 2005; 237:395-400.   Original Report Authenticated By: Signa Kell, M.D.      Post operative complication   MDM  Dr. Imogene Burn requested a CT abdomen pelvis to be done then he will come see the patient.       Nelia Shi, MD 03/21/12 [phone]

## 2012-03-20 NOTE — H&P (Addendum)
Nilda Simmer, MD 03/20/2012 10:58 AM Pended  [facility] [place]  Established Previous Bypass   History of Present Illness   Logan [name] is a 66 y.o. (08/24/1945) male who presents with chief complaint: profuse bleeding from left groin. The patient and family note recurrent bleeding from left groin, requiring suture in left groin at OSH. This patient is a long time patient of Dr. Hart Rochester and Dr. Arbie Cookey, having undergone >8 operation for revascularization included multiple femorofemoral bypass and an aortobifemoral bypass. Most recently emergently, this patient had occlusion of the remaining left aortobifemoral limb and required:   Date of Surgery: 12/20/2011  1. Left aortobifemoral limb thrombectomy 2. Femorofemoral graft thrombectomy 3. Revision of femorofemoral graft with interposition graft 4. Right tibial thrombectomy 5. Right four-compartment fasciotomies 6. Bilateral leg runoff 7.  Procedure(s): 12/25/2011  7. Closure of right medial calf fasciotomy incision (30 cm) 8. Partial closure of right lateral calf fasciotomy (5 cm) 9. Placement of negative pressure dressing Recently he also unerwent a STSG to right lateral fasciotomy wound.    Past Medical History   Diagnosis  Date   .  Overweight    .  Dyslipidemia    .  Cardiomyopathy    .  Diabetes mellitus    .  PVD (peripheral vascular disease)      right to left fem -fem bypass 1997/ aortabifemoral bypass 2005 right to left fem-fem 07/2004. Secondary to occluded righ tlimb of aortobifemoral bypass   .  Tobacco abuse    .  Anginal pain    .  Hypertension    .  Shortness of breath    .  Coronary artery disease      taxis DES stent circumflex 02/2003.... residual total distal circumflex and 50% LAD/ cardiolite 08/2006 inferior scar no ischemia    Past Surgical History   Procedure  Date   .  Knee surgery    .  Axillary-femoral bypass graft  12/20/2011     Procedure: BYPASS GRAFT AXILLA-BIFEMORAL; Surgeon:  Fransisco Hertz, MD; Location: [facility] OR; Service: Vascular; Laterality: Bilateral; Left aorta bilateral femoral thrombectomy, femoral to femoral thrombectomy, and revision of Femoral to femoral graft. and Thrombectomy of Left tibial artery.   .  Fasciotomy  12/20/2011     Procedure: FASCIOTOMY; Surgeon: Fransisco Hertz, MD; Location: [facility] OR; Service: Vascular; Laterality: Right; right calf facsicotomy.   .  Lower extremity angiogram  12/20/2011     Procedure: LOWER EXTREMITY ANGIOGRAM; Surgeon: Fransisco Hertz, MD; Location: [facility] OR; Service: Vascular; Laterality: Bilateral; Intraoperative Abdominal Aortogram with bilateral runoff   .  Left-to-right femoral-femoral bypass graft  07-30-2004 DR LAWSON     OCCLUSION RIGHT LIMB AORTOBIFEMORAL BYPASS GRAFT W/ SEVERE CLAUDICATION   .  Aorto to right common femoral and left profunda femoris bypass graft  09-28-2003 DR LAWSON     SEVERE AORTOILIAC OCCLUSIVE DISEASE W/ OCCLUDED FEM-FEM BYPASS OF BOTH LOWER EXTREMITIES   .  Right lateral parotidectomy with facial verve preservation  02-25-2001 DR [facility]     NEOPLASM RIGHT PAROTID GLAND   .  Coronary angioplasty with stent placement  02-23-2003 DR [facility]     PCI W/ STENTING PROXIMAL CIRCUMFLEX   .  Femoral-femoral bypass graft  1997 DR LAWSON     RIGHT TO LEFT   .  Debridement leg  12/25/11   .  Incision and drainage of wound  02/17/2012     Procedure: IRRIGATION AND DEBRIDEMENT WOUND; Surgeon: Wayland Denis, DO;  Location: [facility]; Service: Plastics; Laterality: Bilateral;    History    Social History   .  Marital Status:  Married     Spouse Name:  N/A     Number of Children:  N/A   .  Years of Education:  N/A     Occupational History   .  Not on file.     Social History Main Topics   .  Smoking status:  Former Smoker -- 40 years     Types:  Cigarettes     Quit date:  12/20/2011   .  Smokeless tobacco:  Never Used   .  Alcohol Use:  Yes      liquior sometimes   .  Drug Use:  No   .   Sexually Active:  Not on file     Other Topics  Concern   .  Not on file     Social History Narrative   .  No narrative on file     Family History   Problem  Relation  Age of Onset   .  Hypertension  Mother      Current Outpatient Prescriptions on File Prior to Visit   Medication  Sig  Dispense  Refill   .  amLODipine (NORVASC) 2.5 MG tablet  Take 2.5 mg by mouth daily.     Marland Kitchen  aspirin EC 81 MG tablet  Take 81 mg by mouth daily.     .  clopidogrel (PLAVIX) 75 MG tablet  Take 1 tablet (75 mg total) by mouth daily with breakfast.     .  glimepiride (AMARYL) 4 MG tablet  Take 4 mg by mouth 2 (two) times daily.     .  insulin aspart (NOVOLOG) 100 UNIT/ML injection  Inject 0-15 Units into the skin 3 (three) times daily with meals.  1 vial    .  insulin glargine (LANTUS) 100 UNIT/ML injection  Inject 60 Units into the skin daily.  10 mL    .  lisinopril (PRINIVIL,ZESTRIL) 40 MG tablet  Take 40 mg by mouth daily.     .  metFORMIN (GLUCOPHAGE) 500 MG tablet  Take 1,000 mg by mouth 2 (two) times daily with a meal.     .  metoprolol (LOPRESSOR) 50 MG tablet  Take 50 mg by mouth 2 (two) times daily.     .  Multiple Vitamins-Minerals (MULTIVITAMIN PO)  Take 1 tablet by mouth daily.     .  Omega-3 Fatty Acids (FISH OIL) 1000 MG CAPS  Take 1 capsule by mouth daily.     Marland Kitchen  omeprazole (PRILOSEC) 20 MG capsule  Take 20 mg by mouth daily.     .  simvastatin (ZOCOR) 20 MG tablet  Take 20 mg by mouth every evening.     Marland Kitchen  acetaminophen (TYLENOL) 325 MG tablet  Take 1-2 tablets (325-650 mg total) by mouth every 4 (four) hours as needed (or temp >/= 101 F).     .  oxyCODONE-acetaminophen (PERCOCET/ROXICET) 5-325 MG per tablet  Take 1 tablet by mouth every 4 (four) hours as needed.     .  pantoprazole (PROTONIX) 40 MG tablet  Take 1 tablet (40 mg total) by mouth daily at 12 noon.     .  polyethylene glycol (MIRALAX / GLYCOLAX) packet  Take 17 g by mouth daily.     Marland Kitchen  Specialty Vitamins Products (PROSTATE  PO)  Take 30 mLs by mouth daily.  Allergies   Allergen  Reactions   .  Aspirin  Hives   .  Penicillins  Other (See Comments)     Immune system shut down      Review of Systems (Positive items checked otherwise negative)  General: [ ]  Weight loss, [ ]  Weight gain, [ ]  Loss of appetite, [ ]  Fever  Neurologic: [ ]  Dizziness, [ ]  Blackouts, [ ]  Headaches, [ ]  Seizure  Ear/Nose/Throat: [ ]  Change in eyesight, [ ]  Change in hearing, [ ]  Nose bleeds, [ ]  Sore throat  Vascular: [x]  Pain in legs with walking, [x]  Pain in feet while lying flat, [ ]  Non-healing ulcer, Stroke, [ ]  "Mini stroke", [ ]  Slurred speech, [ ]  Temporary blindness, [ ]  Blood clot in vein, [ ]  Phlebitis  Pulmonary: [ ]  Home oxygen, [ ]  Productive cough, [ ]  Bronchitis, [ ]  Coughing up blood, [ ]  Asthma, [ ]  Wheezing  Musculoskeletal: [ ]  Arthritis, [ ]  Joint pain, [ ]  Muscle pain  Cardiac: [ ]  Chest pain, [ ]  Chest tightness/pressure, [ ]  Shortness of breath when lying flat, [ ]  Shortness of breath with exertion, [ ]  Palpitations, [ ]  Heart murmur, [ ]  Arrythmia, [ ]  Atrial fibrillation  Hematologic: [ ]  Bleeding problems, [ ]  Clotting disorder, [ ]  Anemia  Psychiatric: [ ]  Depression, [ ]  Anxiety, [ ]  Attention deficit disorder  Gastrointestinal: [ ]  Black stool,[ ]  Blood in stool, [ ]  Peptic ulcer disease, [ ]  Reflux, [ ]  Hiatal hernia, [ ]  Trouble swallowing, [ ]  Diarrhea, [ ]  Constipation  Urinary: [ ]  Kidney disease, [ ]  Burning with urination, [ ]  Frequent urination, [ ]  Difficulty urinating  Skin: [ ]  Ulcers, [ ]  Rashes    Physical Examination  Filed Vitals:    03/20/12 1011   BP:  166/77   Pulse:  79   Temp:  98.8 F (37.1 C)   TempSrc:  Oral   Height:  5\' 9"  (1.753 m)   Weight:  233 lb (105.688 kg)   SpO2:  99%    Body mass index is 34.41 kg/(m^2).  General: A&O x 3, WDWN, obese  Pulmonary: Sym exp, good air movt, CTAB, no rales, rhonchi, & wheezing  Cardiac: RRR, Nl S1, S2, no Murmurs, rubs or gallops   Vascular:  Vessel  Right  Left   Radial  Palpable  Palpable   Brachial  Palpable  Palpable   Carotid  Palpable, without bruit  Palpable, without bruit   Aorta  Non-palpable  N/A   Femoral  Palpable  Strongly Palpable   Popliteal  Non-palpable  Non-palpable   PT  WeaklyPalpable  Not Palpable   DP  NotPalpable  NotPalpable    Musculoskeletal: M/S 5/5 throughout , Extremities without ischemic changes , R groin incision nearly healed, L groin incision: some necrotic fat, no frank bleeding at this point from left groin  Neurologic: Pain and light touch intact in extremities , Motor exam as listed above   Medical Decision Making  Logan [name] is a 66 y.o. male who presents with: s/p L aortobifemoral limb and fem-fem thrombectomy, fasciotomies, STSG present with possible herald bleed  With the extensive amount of scar tissue in the left groin, I doubt ultrasound will be definitive, rather I recommend a CT abdominal and pelvis extended on thigh with contrast to evaluate the aortobifemoral graft and femorofemoral graft for signs of infection or a pseudoaneurysm.  Obviously, infection of the aortobifemoral graft would be devastating  in this patient. Given the >8 procedures done on this patient, I would not be surprise if some graft is infected in this case.  I am sending the patient to the ER to expedite the CT scan and possible intervention if found to be needed.   Leonides Sake, MD  [facility] [place]  Office: [phone]  Pager: [phone]  03/20/2012, 10:48 AM  I have examined the patient, reviewed and agree with above. the patient's CT was examined. There is no evidence ofFluid collection or acute false aneurysm. On physical exam he is not any tenderness over his femoral to femoral graft or fluctuance. There is open granulating area in his left groin with some drainage. I discussed with the patient the concern about potential graft infection. He had bleeding by  history but none currently. He will be observed and will be placed on broad-spectrum antibiotic for observation. I explained that infected graft would require removal to be extensive operation. Will continue to followEARLY, Nethan Caudillo, MD 03/20/2012 6:03 PM

## 2012-03-20 NOTE — Progress Notes (Signed)
[facility] [place]  Established Previous Bypass  History of Present Illness  Logan [name] is a 66 y.o. (1946/01/03) male who presents with chief complaint: profuse bleeding from left groin.  The patient and family note recurrent bleeding from left groin, requiring suture in left groin at OSH.   This patient is a long time patient of Dr. Hart Rochester and Dr. Arbie Cookey, having undergone >8 operation for revascularization included multiple femorofemoral bypass and an aortobifemoral bypass.  Most recently emergently, this patient had occlusion of the remaining left aortobifemoral limb and required:    Date of Surgery: 12/20/2011  1. Left aortobifemoral limb thrombectomy 2. Femorofemoral graft thrombectomy 3. Revision of femorofemoral graft with interposition graft 4. Right tibial thrombectomy 5. Right four-compartment fasciotomies 6. Bilateral leg runoff  Procedure(s): 12/25/2011  7. Closure of right medial calf fasciotomy incision (30 cm) 8. Partial closure of right lateral calf fasciotomy (5 cm) 9. Placement of negative pressure dressing  Recently he also unerwent a STSG to right lateral fasciotomy wound.   Past Medical History  Diagnosis Date  . Overweight   . Dyslipidemia   . Cardiomyopathy   . Diabetes mellitus   . PVD (peripheral vascular disease)     right to left fem -fem bypass 1997/ aortabifemoral bypass 2005 right to left fem-fem 07/2004. Secondary to occluded righ tlimb of aortobifemoral bypass  . Tobacco abuse   . Anginal pain   . Hypertension   . Shortness of breath   . Coronary artery disease     taxis DES stent circumflex 02/2003.... residual total distal circumflex and 50% LAD/ cardiolite 08/2006 inferior scar no ischemia    Past Surgical History  Procedure Date  . Knee surgery   . Axillary-femoral bypass graft 12/20/2011    Procedure: BYPASS GRAFT AXILLA-BIFEMORAL;  Surgeon: Fransisco Hertz, MD;  Location: [facility] OR;  Service: Vascular;  Laterality: Bilateral;   Left aorta bilateral femoral thrombectomy, femoral to femoral thrombectomy, and revision of Femoral to femoral graft. and Thrombectomy of Left tibial artery.  . Fasciotomy 12/20/2011    Procedure: FASCIOTOMY;  Surgeon: Fransisco Hertz, MD;  Location: [facility] OR;  Service: Vascular;  Laterality: Right;  right calf facsicotomy.  . Lower extremity angiogram 12/20/2011    Procedure: LOWER EXTREMITY ANGIOGRAM;  Surgeon: Fransisco Hertz, MD;  Location: [facility] OR;  Service: Vascular;  Laterality: Bilateral;  Intraoperative Abdominal Aortogram with bilateral runoff  . Left-to-right femoral-femoral bypass graft 07-30-2004  DR LAWSON    OCCLUSION RIGHT LIMB AORTOBIFEMORAL BYPASS GRAFT  W/ SEVERE CLAUDICATION  . Aorto to right common femoral and left profunda femoris bypass graft 09-28-2003 DR LAWSON    SEVERE AORTOILIAC OCCLUSIVE DISEASE W/ OCCLUDED FEM-FEM BYPASS OF BOTH LOWER EXTREMITIES  . Right lateral parotidectomy with facial verve preservation 02-25-2001  DR [facility]    NEOPLASM RIGHT PAROTID GLAND  . Coronary angioplasty with stent placement 02-23-2003  DR [facility]    PCI W/ STENTING PROXIMAL CIRCUMFLEX  . Femoral-femoral bypass graft 1997  DR LAWSON    RIGHT TO LEFT  . Debridement leg 12/25/11  . Incision and drainage of wound 02/17/2012    Procedure: IRRIGATION AND DEBRIDEMENT WOUND;  Surgeon: Wayland Denis, DO;  Location: [facility];  Service: Plastics;  Laterality: Bilateral;    History   Social History  . Marital Status: Married    Spouse Name: N/A    Number of Children: N/A  . Years of Education: N/A   Occupational History  . Not on  file.   Social History Main Topics  . Smoking status: Former Smoker -- 40 years    Types: Cigarettes    Quit date: 12/20/2011  . Smokeless tobacco: Never Used  . Alcohol Use: Yes     liquior sometimes  . Drug Use: No  . Sexually Active: Not on file   Other Topics Concern  . Not on file   Social History Narrative  . No narrative on file    Family  History  Problem Relation Age of Onset  . Hypertension Mother     Current Outpatient Prescriptions on File Prior to Visit  Medication Sig Dispense Refill  . amLODipine (NORVASC) 2.5 MG tablet Take 2.5 mg by mouth daily.      Marland Kitchen aspirin EC 81 MG tablet Take 81 mg by mouth daily.      . clopidogrel (PLAVIX) 75 MG tablet Take 1 tablet (75 mg total) by mouth daily with breakfast.      . glimepiride (AMARYL) 4 MG tablet Take 4 mg by mouth 2 (two) times daily.      . insulin aspart (NOVOLOG) 100 UNIT/ML injection Inject 0-15 Units into the skin 3 (three) times daily with meals.  1 vial    . insulin glargine (LANTUS) 100 UNIT/ML injection Inject 60 Units into the skin daily.  10 mL    . lisinopril (PRINIVIL,ZESTRIL) 40 MG tablet Take 40 mg by mouth daily.      . metFORMIN (GLUCOPHAGE) 500 MG tablet Take 1,000 mg by mouth 2 (two) times daily with a meal.      . metoprolol (LOPRESSOR) 50 MG tablet Take 50 mg by mouth 2 (two) times daily.      . Multiple Vitamins-Minerals (MULTIVITAMIN PO) Take 1 tablet by mouth daily.       . Omega-3 Fatty Acids (FISH OIL) 1000 MG CAPS Take 1 capsule by mouth daily.       Marland Kitchen omeprazole (PRILOSEC) 20 MG capsule Take 20 mg by mouth daily.      . simvastatin (ZOCOR) 20 MG tablet Take 20 mg by mouth every evening.      Marland Kitchen acetaminophen (TYLENOL) 325 MG tablet Take 1-2 tablets (325-650 mg total) by mouth every 4 (four) hours as needed (or temp >/= 101 F).      . oxyCODONE-acetaminophen (PERCOCET/ROXICET) 5-325 MG per tablet Take 1 tablet by mouth every 4 (four) hours as needed.      . pantoprazole (PROTONIX) 40 MG tablet Take 1 tablet (40 mg total) by mouth daily at 12 noon.      . polyethylene glycol (MIRALAX / GLYCOLAX) packet Take 17 g by mouth daily.      Marland Kitchen Specialty Vitamins Products (PROSTATE PO) Take 30 mLs by mouth daily.        Allergies  Allergen Reactions  . Aspirin Hives  . Penicillins Other (See Comments)    Immune system shut down     Review of  Systems (Positive items checked otherwise negative)  General: [ ]  Weight loss, [ ]  Weight gain, [ ]   Loss of appetite, [ ]  Fever  Neurologic: [ ]  Dizziness, [ ]  Blackouts, [ ]  Headaches, [ ]  Seizure  Ear/Nose/Throat: [ ]  Change in eyesight, [ ]  Change in hearing, [ ]  Nose bleeds, [ ]  Sore throat  Vascular: [x]  Pain in legs with walking, [x]  Pain in feet while lying flat, [ ]  Non-healing ulcer, Stroke, [ ]  "Mini stroke", [ ]  Slurred speech, [ ]  Temporary blindness, [ ]  Blood  clot in vein, [ ]  Phlebitis  Pulmonary: [ ]  Home oxygen, [ ]  Productive cough, [ ]  Bronchitis, [ ]  Coughing up blood, [ ]  Asthma, [ ]  Wheezing  Musculoskeletal: [ ]  Arthritis, [ ]  Joint pain, [ ]  Muscle pain  Cardiac: [ ]  Chest pain, [ ]  Chest tightness/pressure, [ ]  Shortness of breath when lying flat, [ ]  Shortness of breath with exertion, [ ]  Palpitations, [ ]  Heart murmur, [ ]  Arrythmia, [ ]  Atrial fibrillation  Hematologic: [ ]  Bleeding problems, [ ]  Clotting disorder, [ ]  Anemia  Psychiatric:  [ ]  Depression, [ ]  Anxiety, [ ]  Attention deficit disorder  Gastrointestinal:  [ ]  Black stool,[ ]   Blood in stool, [ ]  Peptic ulcer disease, [ ]  Reflux, [ ]  Hiatal hernia, [ ]  Trouble swallowing, [ ]  Diarrhea, [ ]  Constipation  Urinary:  [ ]  Kidney disease, [ ]  Burning with urination, [ ]  Frequent urination, [ ]  Difficulty urinating  Skin: [ ]  Ulcers, [ ]  Rashes  Physical Examination  Filed Vitals:   03/20/12 1011  BP: 166/77  Pulse: 79  Temp: 98.8 F (37.1 C)  TempSrc: Oral  Height: 5\' 9"  (1.753 m)  Weight: 233 lb (105.688 kg)  SpO2: 99%   Body mass index is 34.41 kg/(m^2).  General: A&O x 3, WDWN, obese  Pulmonary: Sym exp, good air movt, CTAB, no rales, rhonchi, & wheezing  Cardiac: RRR, Nl S1, S2, no Murmurs, rubs or gallops  Vascular: Vessel Right Left  Radial Palpable Palpable  Brachial Palpable Palpable  Carotid Palpable, without bruit Palpable, without bruit  Aorta Non-palpable N/A    Femoral Palpable Strongly Palpable  Popliteal Non-palpable Non-palpable  PT WeaklyPalpable Not Palpable  DP NotPalpable NotPalpable   Musculoskeletal: M/S 5/5 throughout , Extremities without ischemic changes , R groin incision nearly healed, L groin incision: some necrotic fat, no frank bleeding at this point from left groin  Neurologic: Pain and light touch intact in extremities , Motor exam as listed above  Medical Decision Making  TALLIS SOLEDAD is a 66 y.o. male who presents with: s/p L aortobifemoral limb and fem-fem thrombectomy, fasciotomies, STSG present with possible herald bleed  With the extensive amount of scar tissue in the left groin, I doubt ultrasound will be definitive, rather I recommend a CT abdominal and pelvis extended on thigh with contrast to evaluate the aortobifemoral graft and femorofemoral graft for signs of infection or a pseudoaneurysm.  Obviously, infection of the aortobifemoral graft would be devastating in this patient.  Given the >8 procedures done on this patient, I would not be surprise if some graft is infected in this case.  I am sending the patient to the ER to expedite the CT scan and possible intervention if found to be needed.  Leonides Sake, MD [facility] [place] Office: [phone] Pager: [phone]  03/20/2012, 10:48 AM

## 2012-03-20 NOTE — ED Notes (Signed)
Pt reports had bypass on August 2nd. Pt reports bleeding in left groin off and on since. This episode of bleeding began on Saturday. Pt seen in [place] ED on Sunday for bleeding- placed a couple of stitches in groin and told to follow up with vein specialists. Pt at Dr. Nicky Pugh office and told to come to ED. Pt has dressing over groin. Dr. Imogene Burn paged.

## 2012-03-20 NOTE — Progress Notes (Addendum)
Pharmacy received consult to adjust antibiotics based on renal function. Patient is a 66yom who has had > 8 vascular procedures (most recent this past August) who presents to the ED with groin bleeding. Vancomycin 1g IV q12 ordered for possible graft infection. Given patient's weight of 105.7kg, he will need a loading dose. Renal function stable (sCr 1.2, CrCl 108ml/min).  Plan: 1) Vancomycin 2g IV x1 then schedule 1g IV q12 2) Will obtain a vancomycin trough at steady state 3) Will follow renal function, cultures, LOT  Thanks, Gentry Roch 03/20/2012, 5:25 PM  Now patient to begin primaxin per pharmacy. PCN allergy noted. CrCl borderline for q8 versus q6 dosing. Will dose more aggressively for now and adjust if serum creatinine rises.  Plan: 1) Primaxin 500mg  IV q6  Cornelious Bartolucci,PharmD,BCPS 03/20/2012, 5:31 PM

## 2012-03-20 NOTE — ED Notes (Signed)
Pt's wife Logan [name] [phone] (cell) and Logan [name] (daughter) [phone](cell) and [phone] (office). Please call before any procedure is performed.

## 2012-03-20 NOTE — ED Notes (Signed)
Pt sent here from vein specialists to be seen by Dr. Imogene Burn for possible herald bleed. Pt had aorta by stem on August 2nd. Pt bleeding from groin.

## 2012-03-20 NOTE — ED Notes (Signed)
Patient transported to CT 

## 2012-03-20 NOTE — ED Notes (Signed)
Okey Regal, RN paged again and states Dr. Imogene Burn will place orders in to [facility] for exact scan he wants.

## 2012-03-20 NOTE — ED Notes (Signed)
MD aware of pt's CBG, will give D 50 and keep pt NPO until seen by surgery

## 2012-03-20 NOTE — ED Notes (Signed)
Vital signs stable. 

## 2012-03-20 NOTE — ED Notes (Signed)
Called Dr Nicky Pugh office and spoke with his nurse who sts that Dr Arbie Cookey is covering and will eval pt and make a disposition soon

## 2012-03-20 NOTE — ED Notes (Signed)
Patient denies pain and is resting comfortably.  

## 2012-03-20 NOTE — ED Notes (Signed)
Spoke to [place], RN from Dr. Nicky Pugh office- pt to have CT Abd/Pelvis to left groin to rule out herald  Bleed per Dr. Imogene Burn. Office to be called with results. MD Radford Pax made aware.

## 2012-03-21 LAB — BASIC METABOLIC PANEL
Calcium: 8.8 mg/dL (ref 8.4–10.5)
GFR calc Af Amer: 72 mL/min — ABNORMAL LOW (ref >90)
GFR calc non Af Amer: 62 mL/min — ABNORMAL LOW (ref >90)
Sodium: 141 mEq/L (ref 135–145)

## 2012-03-21 LAB — GLUCOSE, CAPILLARY
Glucose-Capillary: 215 mg/dL — ABNORMAL HIGH (ref 70–99)
Glucose-Capillary: 254 mg/dL — ABNORMAL HIGH (ref 70–99)
Glucose-Capillary: 157 mg/dL — ABNORMAL HIGH (ref 70–99)

## 2012-03-21 LAB — CBC
MCV: 87.5 fL (ref 78.0–100.0)
Platelets: 262 10*3/uL (ref 150–400)
RBC: 2.56 MIL/uL — ABNORMAL LOW (ref 4.22–5.81)
WBC: 8.1 10*3/uL (ref 4.0–10.5)

## 2012-03-21 LAB — HEMOGLOBIN A1C: Mean Plasma Glucose: 169 mg/dL — ABNORMAL HIGH (ref <117)

## 2012-03-21 MED ORDER — FUROSEMIDE 10 MG/ML IJ SOLN
20.0000 mg | Freq: Once | INTRAMUSCULAR | Status: AC
  Administered 2012-03-21: 20 mg via INTRAVENOUS

## 2012-03-21 MED ORDER — FUROSEMIDE 10 MG/ML IJ SOLN
INTRAMUSCULAR | Status: AC
  Administered 2012-03-21: 20 mg via INTRAVENOUS
  Filled 2012-03-21: qty 4

## 2012-03-21 NOTE — Progress Notes (Addendum)
[facility] Progress Note  03/21/2012 8:52 AM HD 1  Subjective:  No complaints  Afebrile x 24 hrs VSS 100%RA  Filed Vitals:   03/21/12 0443  BP: 132/66  Pulse: 84  Temp: 98.1 F (36.7 C)  Resp: 18    Physical Exam: Cardiac:  regular Lungs:  Non labored Extremities:  Bilateral groins are stable from yesterday.  No bleeding.  Continues to be malodorous.  CBC    Component Value Date/Time   WBC 8.1 03/21/2012 0620   RBC 2.56* 03/21/2012 0620   HGB 7.2* 03/21/2012 0620   HCT 22.4* 03/21/2012 0620   PLT 262 03/21/2012 0620   MCV 87.5 03/21/2012 0620   MCH 28.1 03/21/2012 0620   MCHC 32.1 03/21/2012 0620   RDW 14.6 03/21/2012 0620    BMET    Component Value Date/Time   NA 141 03/21/2012 0620   K 3.9 03/21/2012 0620   CL 107 03/21/2012 0620   CO2 26 03/21/2012 0620   GLUCOSE 110* 03/21/2012 0620   BUN 15 03/21/2012 0620   CREATININE 1.17 03/21/2012 0620   CALCIUM 8.8 03/21/2012 0620   GFRNONAA 62* 03/21/2012 0620   GFRAA 72* 03/21/2012 0620    INR    Component Value Date/Time   INR 0.97 12/20/2011 1345     Intake/Output Summary (Last 24 hours) at 03/21/12 0852 Last data filed at 03/20/12 1340  Gross per 24 hour  Intake      0 ml  Output    400 ml  Net   -400 ml     Assessment/Plan:  66 y.o. male is  s/p L aortobifemoral limb and fem-fem thrombectomy, fasciotomies, STSG present with possible herald bleed  HD 1  -significant drop in Hgb from yesterday-no evidence of bleeding as pt is stable.  Rehydrating possible cause of drop in hgb.  Will transfuse 2 units with diuresis b/w units. -ck CBC in am -wounds in bilateral groins are stable from yesterday.   -continue dressing changes as ordered -continue broad spectrum IV ABx -pt is diabetic-consult in to DM coordinator  Doreatha Massed, PA-C [facility] [phone] 03/21/2012 8:52 AM I have examined the patient, reviewed and agree with above. Remains afebrile. No other bleeding.  Will transfuse. Reford Olliff, MD 03/21/2012 9:29 AM

## 2012-03-21 NOTE — Progress Notes (Signed)
Changed dressings to  Bilateral groin per MD order per hospital policy. Patient tolerated well, will continue to monitor. Lajuana Matte, RN

## 2012-03-22 LAB — CBC
MCV: 87.2 fL (ref 78.0–100.0)
Platelets: 249 10*3/uL (ref 150–400)
RBC: 2.65 MIL/uL — ABNORMAL LOW (ref 4.22–5.81)
RDW: 14.3 % (ref 11.5–15.5)
WBC: 8 10*3/uL (ref 4.0–10.5)

## 2012-03-22 LAB — GLUCOSE, CAPILLARY: Glucose-Capillary: 207 mg/dL — ABNORMAL HIGH (ref 70–99)

## 2012-03-22 NOTE — Progress Notes (Signed)
Patient ID: Logan [name], male   DOB: 07/05/1945, 66 y.o.   MRN: [id number] Agree with assessments of M Collins PA-c.  Groin stable with no further bleeding. No erythema. Responded to local wound care. Patient had refused blood transfusion yesterday mainly because he did not want to be restarted for type and cross. It is a great transfusion now. Remains hemodynamically stable.

## 2012-03-22 NOTE — Patient Care Conference (Signed)
Pt. Refused 10:00 PM CBG check.

## 2012-03-22 NOTE — Progress Notes (Signed)
[facility] [place]  Subjective  Patient refused blood work and transfusion.  After talking to patient and explaining the need for blood work he now will get the transfusion and blood tests.   Objective 125/60 79 98.4 F (36.9 C) (Oral) 19 100%  Intake/Output Summary (Last 24 hours) at 03/22/12 0830 Last data filed at 03/22/12 0758  Gross per 24 hour  Intake    240 ml  Output   1150 ml  Net   -910 ml    Bilateral groins dressing clean and dry.  No active bleeding. Right foot with moderate edema.  Skin in both feet warm to touch.  Right lower leg dressing over skin graft clean and dry.  Assessment/Planning:  Hemoglobin this morning pending last blood draw reported 7.2 03-21-2012. Transfuse 2 units PRBC today and CBC in the AM. continue dressing changes as ordered  -continue broad spectrum IV ABx  -pt is diabetic-consult in to DM coordinator   Clinton Gallant [facility] 03/22/2012 8:30 AM --  Laboratory Lab Results:  Basename 03/21/12 0620 03/20/12 1402  WBC 8.1 --  HGB 7.2* 8.5*  HCT 22.4* 25.0*  PLT 262 --   BMET  Basename 03/21/12 0620 03/20/12 1402  NA 141 143  K 3.9 4.2  CL 107 106  CO2 26 --  GLUCOSE 110* 124*  BUN 15 15  CREATININE 1.17 1.20  CALCIUM 8.8 --    COAG Lab Results  Component Value Date   INR 0.97 12/20/2011   No results found for this basename: PTT    Antibiotics Anti-infectives     Start     Dose/Rate Route Frequency Ordered Stop   03/21/12 0600   vancomycin (VANCOCIN) IVPB 1000 mg/200 mL premix        1,000 mg 200 mL/hr over 60 Minutes Intravenous Every 12 hours 03/20/12 1722     03/20/12 1800   imipenem-cilastatin (PRIMAXIN) 500 mg in sodium chloride 0.9 % 100 mL IVPB        500 mg 200 mL/hr over 30 Minutes Intravenous 4 times per day 03/20/12 1733     03/20/12 1730   vancomycin (VANCOCIN) 2,000 mg in sodium chloride 0.9 % 500 mL IVPB        2,000 mg 250 mL/hr over 120 Minutes Intravenous  Once  03/20/12 1722     03/20/12 1715   vancomycin (VANCOCIN) IVPB 1000 mg/200 mL premix  Status:  Discontinued        1,000 mg 200 mL/hr over 60 Minutes Intravenous Every 12 hours 03/20/12 1713 03/20/12 1722

## 2012-03-23 ENCOUNTER — Encounter (HOSPITAL_BASED_OUTPATIENT_CLINIC_OR_DEPARTMENT_OTHER): Payer: Medicare Other

## 2012-03-23 DIAGNOSIS — D649 Anemia, unspecified: Secondary | ICD-10-CM

## 2012-03-23 DIAGNOSIS — Y838 Other surgical procedures as the cause of abnormal reaction of the patient, or of later complication, without mention of misadventure at the time of the procedure: Secondary | ICD-10-CM

## 2012-03-23 DIAGNOSIS — T8140XA Infection following a procedure, unspecified, initial encounter: Secondary | ICD-10-CM

## 2012-03-23 LAB — GLUCOSE, CAPILLARY
Glucose-Capillary: 167 mg/dL — ABNORMAL HIGH (ref 70–99)
Glucose-Capillary: 175 mg/dL — ABNORMAL HIGH (ref 70–99)
Glucose-Capillary: 191 mg/dL — ABNORMAL HIGH (ref 70–99)

## 2012-03-23 LAB — TYPE AND SCREEN
Antibody Screen: NEGATIVE
Unit division: 0

## 2012-03-23 LAB — CBC
HCT: 31.9 % — ABNORMAL LOW (ref 39.0–52.0)
Hemoglobin: 10.7 g/dL — ABNORMAL LOW (ref 13.0–17.0)
MCHC: 33.5 g/dL (ref 30.0–36.0)
RBC: 3.71 MIL/uL — ABNORMAL LOW (ref 4.22–5.81)

## 2012-03-23 NOTE — Progress Notes (Addendum)
[facility] [place]  Daily Progress Note  Assessment/Planning: Left groin bleeding: etiology unclear   Continue IV abx  Transfusions completed last night.  Repeat CBC today.  Continue LWC to both groin  Will need to get Dr. Leonie Green input into how she wants right leg managed due to recent STSG on that leg.  I will let Dr. Hart Rochester know pt is admitted as he is the primary vascular surgeon for this patient.  I would complete the initial IV abx in the hospital to allow observation for a possible rebleed also.    Subjective    No bleeding, staying in bed  Objective Filed Vitals:   03/22/12 2226 03/22/12 2326 03/23/12 0005 03/23/12 0625  BP: 147/75 138/74 155/75 145/78  Pulse: 77 85 80 78  Temp: 98.4 F (36.9 C) 98.3 F (36.8 C) 98.5 F (36.9 C) 98.4 F (36.9 C)  TempSrc: Oral Oral Oral Oral  Resp: 18 16 16 17   Height:      Weight:    231 lb 11.3 oz (105.1 kg)  SpO2: 95% 99% 97% 97%    Intake/Output Summary (Last 24 hours) at 03/23/12 0808 Last data filed at 03/22/12 2228  Gross per 24 hour  Intake  862.5 ml  Output    625 ml  Net  237.5 ml    PULM  CTAB CV  RRR GI  soft, NTND VASC  No bleeding on either groin bandages, minimal skin openings with any frank drainage  Laboratory CBC    Component Value Date/Time   WBC 8.0 03/22/2012 0823   HGB 7.5* 03/22/2012 0823   HCT 23.1* 03/22/2012 0823   PLT 249 03/22/2012 0823    BMET    Component Value Date/Time   NA 141 03/21/2012 0620   K 3.9 03/21/2012 0620   CL 107 03/21/2012 0620   CO2 26 03/21/2012 0620   GLUCOSE 110* 03/21/2012 0620   BUN 15 03/21/2012 0620   CREATININE 1.17 03/21/2012 0620   CALCIUM 8.8 03/21/2012 0620   GFRNONAA 62* 03/21/2012 0620   GFRAA 72* 03/21/2012 0620    Leonides Sake, MD [facility] [place] Office: [phone] Pager: [phone]  03/23/2012, 8:08 AM    Addendum  Patient has Acute Blood Loss Anemia of unknown etiology.  Suspicion  is possible infection of L aortobifemoral limb or fem-fem bypass.  Leonides Sake, MD [facility] [place] Office: [phone] Pager: [phone]  03/23/2012, 1:34 PM

## 2012-03-23 NOTE — Progress Notes (Signed)
Patient is currently active with long-term disease management services with [facility]. Patient will continued to be followed by [facility] Care Management services post discharge. Services provided by [facility] Care Management do not interfere or replace home health services that may be arranged by inpatient case manager.   Raiford Noble MSN-Ed, RN,BSN [facility] Liaison [phone]

## 2012-03-23 NOTE — Progress Notes (Signed)
Dressing to bilateral groin changed per MD order. Will continue to monitor. Dion Saucier

## 2012-03-23 NOTE — Clinical Documentation Improvement (Signed)
Anemia Blood Loss Clarification  THIS DOCUMENT IS NOT A PERMANENT PART OF THE MEDICAL RECORD  RESPOND TO THE THIS QUERY, FOLLOW THE INSTRUCTIONS BELOW:  1. If needed, update documentation for the patient's encounter via the notes activity.  2. Access this query again and click edit on the In [employer].  3. After updating, or not, click F2 to complete all highlighted (required) fields concerning your review. Select "additional documentation in the medical record" OR "no additional documentation provided".  4. Click Sign note button.  5. The deficiency will fall out of your In Basket *Please let us know if you are not able to complete this workflow by phone or e-mail (listed below).        03/23/12  Dear Dr.  [facility]   In an effort to better capture your patient's severity of illness, reflect appropriate length of stay and utilization of resources, a review of the patient medical record has revealed the following indicators.    Based on your clinical judgment, please clarify and document in a progress note and/or discharge summary the clinical condition associated with the following supporting information:  In responding to this query please exercise your independent judgment.  The fact that a query is asked, does not imply that any particular answer is desired or expected.   Possible Clinical Conditions  Expected Acute Blood Loss Anemia  Acute Blood Loss Anemia  Acute on chronic blood loss anemia  Other Condition  Cannot Clinically Determine    Supporting Information: "Recurrent bleeding from left groin" per notes "Transfusions completed last night" per notes  Risk Factors: S/P L aortobifemoral limb and fem-fem thrombectomy, fasciotomies, STSG present with possible herald bleed HD 1   Diagnostics: Results for Logan [name], Logan [name] (MRN [id number]) as of 03/23/2012 10:56  Ref. Range 12/24/2011 05:50 02/17/2012 10:24 03/20/2012 14:02 03/21/2012 06:20 03/22/2012  08:23  Hemoglobin Latest Range: 13.0-17.0 g/dL 40.9 (L) 81.1 (L) 8.5 (L) 7.2 (L) 7.5 (L)  HCT Latest Range: 39.0-52.0 % 36.6 (L) 32.0 (L) 25.0 (L) 22.4 (L) 23.1 (L)   Treatments: Transfusion of 2utsPRBC Monitoring H&H, bleeding/drainage (lt groin) and cardiac monitoring   Reviewed: additional documentation in the medical record  Thank You,     Rossie Muskrat RN, BSN  Clinical Documentation Specialist Pager:  [phone]  [email or web address]  Health Information Management [place]  Addendum,  Patient has Acute Blood Loss Anemia from unknown etiology.

## 2012-03-23 NOTE — Consult Note (Signed)
[facility]  Total days of antibiotics 4        Day 4 vancomycin        Day 4 imipenem               Reason for Consult: deep tissue infection, concern for vascular graft infection    Referring Physician:   Active Problems:  * No active hospital problems. *   HPI: Logan [name] is a 66 y.o. male with history of DM, PVD, vasculopath with numerous revascularization surgeries inc. Femorofemoral bypass and aortobifemoral bypass. He presented with bilateral threatened limbs due to occlusion of the remaining left aortobifemoral limb on 12/20/11  S/p Left aortobifemoral limb thrombectomy, Femorofemoral graft thrombectomy, Revision of femorofemoral graft with interposition graft, Right tibial thrombectomy, Right four-compartment fasciotomies, and subsequent closure of right medial calf fasciotomy, and partial closure of right lateral calf fasciotomy on 8/7. His surgeries have been complicated by poor wound healing requiring I x D of bilateral groin wound with split thickness skin graft on 9/30. The patient states his left groin wound had been persistently bleeding an he was sent to the ED for workup of possible aortic fistula. He underwent CT which did not show any hematoma nor any pseudoaneurysm and no fluid collections within the groin. There is a small pseudoaneurysm within the right groin 7mm in size. Bleeding subsided, but the wound has purulent drainage that is foul smelling. He was started on vancomycin and imipenem as an empiric regimen. On admit, he did hot have any increased wbc nor fevers. He denies fever, chills, nightsweats. He states that the foul odor is no longer present.     Past Medical History  Diagnosis Date  . Overweight   . Dyslipidemia   . Cardiomyopathy   . Diabetes mellitus   . PVD (peripheral vascular disease)     right to left fem -fem bypass 1997/ aortabifemoral bypass 2005 right to left fem-fem 07/2004. Secondary to occluded righ tlimb of  aortobifemoral bypass  . Tobacco abuse   . Anginal pain   . Hypertension   . Shortness of breath   . Coronary artery disease     taxis DES stent circumflex 02/2003.... residual total distal circumflex and 50% LAD/ cardiolite 08/2006 inferior scar no ischemia    Allergies:  Allergies  Allergen Reactions  . Aspirin Hives  . Penicillins Rash and Other (See Comments)    Immune system shut down     Current antibiotics:   MEDICATIONS:    . amLODipine  2.5 mg Oral Daily  . aspirin EC  81 mg Oral Daily  . clopidogrel  75 mg Oral Q breakfast  . glimepiride  4 mg Oral BID  . imipenem-cilastatin  500 mg Intravenous Q6H  . indomethacin  50 mg Oral TID WC  . insulin aspart  0-15 Units Subcutaneous TID WC  . insulin glargine  60 Units Subcutaneous Daily  . lisinopril  40 mg Oral Daily  . metoprolol  50 mg Oral BID  . omega-3 acid ethyl esters  1 g Oral Daily  . pantoprazole  40 mg Oral Daily  . pioglitazone  30 mg Oral Daily  . potassium chloride  20-40 mEq Oral Once  . simvastatin  20 mg Oral QPM  . vancomycin  2,000 mg Intravenous Once  . vancomycin  1,000 mg Intravenous Q12H    History  Substance Use Topics  . Smoking status: Former Smoker -- 40 years    Types:  Cigarettes    Quit date: 12/20/2011  . Smokeless tobacco: Never Used  . Alcohol Use: Yes     Comment: liquior sometimes    Family History  Problem Relation Age of Onset  . Hypertension Mother      Review of Systems  Constitutional: Negative for fever, chills, diaphoresis, activity change, appetite change, fatigue and unexpected weight change.  HENT: Negative for congestion, sore throat, rhinorrhea, sneezing, trouble swallowing and sinus pressure.  Eyes: Negative for photophobia and visual disturbance.  Respiratory: Negative for cough, chest tightness, shortness of breath, wheezing and stridor.  Cardiovascular: Negative for chest pain, palpitations and leg swelling.  Gastrointestinal: Negative for nausea,  vomiting, abdominal pain, diarrhea, constipation, blood in stool, abdominal distention and anal bleeding.  Genitourinary: Negative for dysuria, hematuria, flank pain and difficulty urinating.  Musculoskeletal: Negative for myalgias, back pain, joint swelling, arthralgias and gait problem.  Skin: per hpi Neurological: Negative for dizziness, tremors, weakness and light-headedness.  Hematological: Negative for adenopathy. Bleeding from groin sites   OBJECTIVE: Temp:  [98.1 F (36.7 C)-98.5 F (36.9 C)] 98.1 F (36.7 C) (11/04 1437) Pulse Rate:  [74-85] 76  (11/04 1437) Resp:  [16-18] 18  (11/04 1437) BP: (138-155)/(66-78) 144/66 mmHg (11/04 1437) SpO2:  [95 %-100 %] 100 % (11/04 1437) Weight:  [231 lb 11.3 oz (105.1 kg)] 231 lb 11.3 oz (105.1 kg) (11/04 1610) Physical Exam  Constitutional: He is oriented to person, place, and time. He appears well-developed and well-nourished. No distress.  HENT:  Mouth/Throat: Oropharynx is clear and moist. No oropharyngeal exudate.  Cardiovascular: Normal rate, regular rhythm and normal heart sounds. Exam reveals no gallop and no friction rub.  No murmur heard.  Pulmonary/Chest: Effort normal and breath sounds normal. No respiratory distress. He has no wheezes.  Abdominal: Soft. Bowel sounds are normal. He exhibits no distension. There is no tenderness.  Lymphadenopathy:  no cervical adenopathy.  Groin: right groin incision is healed. L groin incision-> no blood in the wound bed, but does have purulent fibrinous exudate.  Neurological: He is alert and oriented to person, place, and time.  Skin: Skin is warm and dry. No rash noted. No erythema. Right leg wrapped from recent skin graft  Psychiatric: He has a normal mood and affect. His behavior is normal.    LABS: Results for orders placed during the hospital encounter of 03/20/12 (from the past 48 hour(s))  GLUCOSE, CAPILLARY     Status: Abnormal   Collection Time   03/21/12  8:37 PM      Component  Value Range Comment   Glucose-Capillary 157 (*) 70 - 99 mg/dL    Comment 1 Notify RN     GLUCOSE, CAPILLARY     Status: Abnormal   Collection Time   03/22/12  6:50 AM      Component Value Range Comment   Glucose-Capillary 207 (*) 70 - 99 mg/dL   CBC     Status: Abnormal   Collection Time   03/22/12  8:23 AM      Component Value Range Comment   WBC 8.0  4.0 - 10.5 K/uL    RBC 2.65 (*) 4.22 - 5.81 MIL/uL    Hemoglobin 7.5 (*) 13.0 - 17.0 g/dL    HCT 96.0 (*) 45.4 - 52.0 %    MCV 87.2  78.0 - 100.0 fL    MCH 28.3  26.0 - 34.0 pg    MCHC 32.5  30.0 - 36.0 g/dL    RDW 09.8  11.9 -  15.5 %    Platelets 249  150 - 400 K/uL   TYPE AND SCREEN     Status: Normal   Collection Time   03/22/12  8:40 AM      Component Value Range Comment   ABO/RH(D) A POS      Antibody Screen NEG      Sample Expiration 03/25/2012      Unit Number [id number]      Blood Component Type RED CELLS,LR      Unit division 00      Status of Unit ISSUED,FINAL      Transfusion Status OK TO TRANSFUSE      Crossmatch Result Compatible      Unit Number [id number]      Blood Component Type RED CELLS,LR      Unit division 00      Status of Unit ISSUED,FINAL      Transfusion Status OK TO TRANSFUSE      Crossmatch Result Compatible     PREPARE RBC (CROSSMATCH)     Status: Normal   Collection Time   03/22/12  8:40 AM      Component Value Range Comment   Order Confirmation ORDER PROCESSED BY BLOOD BANK     GLUCOSE, CAPILLARY     Status: Abnormal   Collection Time   03/22/12 11:33 AM      Component Value Range Comment   Glucose-Capillary 227 (*) 70 - 99 mg/dL    Comment 1 Documented in Chart      Comment 2 Notify RN     GLUCOSE, CAPILLARY     Status: Abnormal   Collection Time   03/22/12  4:09 PM      Component Value Range Comment   Glucose-Capillary 246 (*) 70 - 99 mg/dL    Comment 1 Notify RN     GLUCOSE, CAPILLARY     Status: Abnormal   Collection Time   03/23/12  6:29 AM      Component Value Range Comment    Glucose-Capillary 167 (*) 70 - 99 mg/dL   CBC     Status: Abnormal   Collection Time   03/23/12 10:34 AM      Component Value Range Comment   WBC 11.7 (*) 4.0 - 10.5 K/uL    RBC 3.71 (*) 4.22 - 5.81 MIL/uL    Hemoglobin 10.7 (*) 13.0 - 17.0 g/dL POST TRANSFUSION SPECIMEN   HCT 31.9 (*) 39.0 - 52.0 %    MCV 86.0  78.0 - 100.0 fL    MCH 28.8  26.0 - 34.0 pg    MCHC 33.5  30.0 - 36.0 g/dL    RDW 13.2  44.0 - 10.2 %    Platelets 282  150 - 400 K/uL   GLUCOSE, CAPILLARY     Status: Abnormal   Collection Time   03/23/12 11:31 AM      Component Value Range Comment   Glucose-Capillary 175 (*) 70 - 99 mg/dL    Comment 1 Documented in Chart      Comment 2 Notify RN     GLUCOSE, CAPILLARY     Status: Abnormal   Collection Time   03/23/12  4:32 PM      Component Value Range Comment   Glucose-Capillary 191 (*) 70 - 99 mg/dL    Comment 1 Documented in Chart      Comment 2 Notify RN       MICRO: No cultures  Assessment/Plan:  66yo Male with poorly healing  groin incision sites from recent revascularization surgery that appears to have superficial wound infection. CT scan does not appear to have fistulous track nor fluid collection around vascular graft.  - recommend to treat for 10 days with oral antibiotics and local wound care.  -  Would switch IV vancomycin and imipenem to bactrim 1 DS BID, cipro 500mg  PO BID, and metronidazole 500mg  PO TID. This would cover broad spectrum of skin flora and enteric flora.  Duke Salvia Drue Second MD MPH [facility] [phone]

## 2012-03-23 NOTE — Progress Notes (Signed)
Inpatient Diabetes Program Recommendations  AACE/ADA: New Consensus Statement on Inpatient Glycemic Control (2013)  Target Ranges:  Prepandial:   less than 140 mg/dL      Peak postprandial:   less than 180 mg/dL (1-2 hours)      Critically ill patients:  140 - 180 mg/dL   Inpatient Diabetes Program Recommendations Insulin - Basal: Increase Lantus to 65 units  Oral Agents: Change AMARYL to BID with meals not HS  Note: Patient's A1C in August was 9.7 but now 7.5 indicating improved management at home.   Will follow during this admission. Thank you  Piedad Climes RN,BSN,CDE Inpatient Diabetes Coordinator [phone] (team pager)

## 2012-03-23 NOTE — Progress Notes (Signed)
Pt ambulated 750 ft with rolling walker. Tolerated ambulation well. Dion Saucier

## 2012-03-23 NOTE — Care Management Note (Signed)
    Page 1 of 2   03/30/2012     4:49:25 PM   CARE MANAGEMENT NOTE 03/30/2012  Patient:  Logan [name], Logan [name]   Account Number:  [id number]  Date Initiated:  03/23/2012  Documentation initiated by:  Kruze Atchley  Subjective/Objective Assessment:   PT AMD ON 03/20/12 WITH BILATERAL GROIN BLEEDING, ANEMIA. PTA, PT INDPENDENT, LIVES WITH SPOUSE.     Action/Plan:   PT'S WIFE TO PROVIDE CARE AT DISCHARGE.  WILL FOLLOW FOR HOME NEEDS AS PT PROGRESSES.   Anticipated DC Date:  03/26/2012   Anticipated DC Plan:  HOME W HOME HEALTH SERVICES      DC Planning Services  CM consult      [facility] Choice  HOME HEALTH   Choice offered to / List presented to:  [name]        HH arranged  HH-1 RN      [facility] agency  [facility]   Status of service:  Completed, signed off Medicare Important Message given?   (If response is "NO", the following Medicare IM given date fields will be blank) Date Medicare IM given:   Date Additional Medicare IM given:    Discharge Disposition:  HOME W HOME HEALTH SERVICES  Per UR Regulation:  Reviewed for med. necessity/level of care/duration of stay  If discussed at Long Length of Stay Meetings, dates discussed:    Comments:  03/30/12 Rosalita Chessman [phone] PT FOR DISCHARGE HOME 03/31/12.  MET WITH PT TO FINALIZE DC PLANS.  PT TO DC HOME WITH WIFE.  PAST HX WITH [facility], AND WISHES TO USE AGAIN FOR HHRN FOR WOUND CARE.  REFERRAL TO [facility] FOR HH NEEDS.  START OF CARE 24-48H POST DC DATE.

## 2012-03-23 NOTE — Progress Notes (Signed)
ANTIBIOTIC CONSULT NOTE - FOLLOW UP  Pharmacy Consult for vancomycin and imipenem Indication: empiric S/P L aortobifemoral limb and fem-fem thrombectomy, fasciotomies, STSG present with possible herald bleed HD 1      Allergies  Allergen Reactions  . Aspirin Hives  . Penicillins Rash and Other (See Comments)    Immune system shut down     Patient Measurements: Height: 5' 8.9" (175 cm) Weight: 231 lb 11.3 oz (105.1 kg) IBW/kg (Calculated) : 70.47    Vital Signs: Temp: 98.1 F (36.7 C) (11/04 0815) Temp src: Oral (11/04 0815) BP: 150/78 mmHg (11/04 0934) Pulse Rate: 83  (11/04 0934) Intake/Output from previous day: 11/03 0701 - 11/04 0700 In: 862.5 [I.V.:500; Blood:362.5] Out: 950 [Urine:950] Intake/Output from this shift: Total I/O In: 240 [P.O.:240] Out: -   Labs:  Basename 03/23/12 1034 03/22/12 0823 03/21/12 0620  WBC 11.7* 8.0 8.1  HGB 10.7* 7.5* 7.2*  PLT 282 249 262  LABCREA -- -- --  CREATININE -- -- 1.17   Estimated Creatinine Clearance: 72.1 ml/min (by C-G formula based on Cr of 1.17). No results found for this basename: VANCOTROUGH:2,VANCOPEAK:2,VANCORANDOM:2,GENTTROUGH:2,GENTPEAK:2,GENTRANDOM:2,TOBRATROUGH:2,TOBRAPEAK:2,TOBRARND:2,AMIKACINPEAK:2,AMIKACINTROU:2,AMIKACIN:2, in the last 72 hours   Microbiology: No results found for this or any previous visit (from the past 720 hour(s)).  Assessment: 66 yo man on day # 3 of vancomycin and imipenem empiric therapy S/P L aortobifemoral limb and fem-fem thrombectomy, fasciotomies, STSG present with possible herald bleed HD 1. His WBC is up today to 11.7 from 8.  He is afebrile. No current culture data available.  Last creatinine 1.17 on 03/21/12.  Goal of Therapy:  Vancomycin trough level 10-15 mcg/ml  Plan:  1. Continue imipenem 500 mg IV q6h 2. Continue vancomycin 1000 mg IV q12 s/p 5 doses 3. Check BMET in am and vancomycin trough with am labs prior to 06am dose Herby Abraham,  Pharm.D. [phone] 03/23/2012 2:34 PM

## 2012-03-24 LAB — BASIC METABOLIC PANEL
Calcium: 9.2 mg/dL (ref 8.4–10.5)
GFR calc Af Amer: 74 mL/min — ABNORMAL LOW (ref >90)
GFR calc non Af Amer: 64 mL/min — ABNORMAL LOW (ref >90)
Glucose, Bld: 212 mg/dL — ABNORMAL HIGH (ref 70–99)
Potassium: 4.3 mEq/L (ref 3.5–5.1)
Sodium: 140 mEq/L (ref 135–145)

## 2012-03-24 LAB — VANCOMYCIN, TROUGH: Vancomycin Tr: 16.3 ug/mL (ref 10.0–20.0)

## 2012-03-24 LAB — CBC
MCH: 29.4 pg (ref 26.0–34.0)
MCHC: 33.9 g/dL (ref 30.0–36.0)
Platelets: 266 10*3/uL (ref 150–400)
RDW: 14.3 % (ref 11.5–15.5)

## 2012-03-24 LAB — GLUCOSE, CAPILLARY: Glucose-Capillary: 179 mg/dL — ABNORMAL HIGH (ref 70–99)

## 2012-03-24 MED ORDER — CIPROFLOXACIN HCL 500 MG PO TABS
500.0000 mg | ORAL_TABLET | Freq: Two times a day (BID) | ORAL | Status: DC
  Administered 2012-03-24 – 2012-03-30 (×14): 500 mg via ORAL
  Filled 2012-03-24 (×17): qty 1

## 2012-03-24 MED ORDER — METRONIDAZOLE 500 MG PO TABS
500.0000 mg | ORAL_TABLET | Freq: Three times a day (TID) | ORAL | Status: DC
  Administered 2012-03-24 – 2012-03-31 (×22): 500 mg via ORAL
  Filled 2012-03-24 (×26): qty 1

## 2012-03-24 MED ORDER — SULFAMETHOXAZOLE-TMP DS 800-160 MG PO TABS
1.0000 | ORAL_TABLET | Freq: Two times a day (BID) | ORAL | Status: DC
  Administered 2012-03-24 – 2012-03-30 (×14): 1 via ORAL
  Filled 2012-03-24 (×16): qty 1

## 2012-03-24 MED ORDER — FUROSEMIDE 10 MG/ML IJ SOLN
20.0000 mg | Freq: Once | INTRAMUSCULAR | Status: AC
  Administered 2012-03-24: 20 mg via INTRAVENOUS

## 2012-03-24 MED ORDER — FUROSEMIDE 10 MG/ML IJ SOLN
INTRAMUSCULAR | Status: AC
  Filled 2012-03-24: qty 4

## 2012-03-24 NOTE — Progress Notes (Addendum)
[facility]  Subjective    S/P L aortobifemoral limb and fem-fem thrombectomy, fasciotomies, STSG present with possible herald bleed HD .1 Patient has Acute Blood Loss Anemia from unknown etiology at left groin site.   Objective 148/71 75 98.1 F (36.7 C) (Oral) 19 99%  Intake/Output Summary (Last 24 hours) at 03/24/12 0755 Last data filed at 03/24/12 0444  Gross per 24 hour  Intake    840 ml  Output   1650 ml  Net   -810 ml    Right groin dressing clean and dry. Min. Bloody drainage left groin when dressing was changed. Right foot with moderate edema. Skin in both feet warm to touch. Right lower leg dressing over skin graft clean and dry.   Mod. Edema in bilateral feet this am.   Assessment/Planning: Continue antibiotics and dressing changes to bilateral groins. Out of bed as tolerates, elevate bilateral feet when in bed. Lasix 20 mg times one dose today.    Clinton Gallant [facility] 03/24/2012 7:55 AM --  Laboratory Lab Results:  Basename 03/24/12 0455 03/23/12 1034  WBC 9.0 11.7*  HGB 10.1* 10.7*  HCT 29.8* 31.9*  PLT 266 282   BMET  Basename 03/24/12 0455  NA 140  K 4.3  CL 106  CO2 24  GLUCOSE 212*  BUN 17  CREATININE 1.15  CALCIUM 9.2    COAG Lab Results  Component Value Date   INR 0.97 12/20/2011   No results found for this basename: PTT    Antibiotics Anti-infectives     Start     Dose/Rate Route Frequency Ordered Stop   03/21/12 0600   vancomycin (VANCOCIN) IVPB 1000 mg/200 mL premix        1,000 mg 200 mL/hr over 60 Minutes Intravenous Every 12 hours 03/20/12 1722     03/20/12 1800  imipenem-cilastatin (PRIMAXIN) 500 mg in sodium chloride 0.9 % 100 mL IVPB       500 mg 200 mL/hr over 30 Minutes Intravenous 4 times per day 03/20/12 1733     03/20/12 1730   vancomycin (VANCOCIN) 2,000 mg in sodium chloride 0.9 % 500 mL IVPB        2,000 mg 250 mL/hr over 120 Minutes Intravenous  Once 03/20/12 1722      03/20/12 1715   vancomycin (VANCOCIN) IVPB 1000 mg/200 mL premix  Status:  Discontinued        1,000 mg 200 mL/hr over 60 Minutes Intravenous Every 12 hours 03/20/12 1713 03/20/12 1722          Addendum  I have independently interviewed and examined the patient, and I agree with the physician assistant's findings.  Appreciate ID's help with abx regimen.  Will need to discuss with Dr. Hart Rochester future plans in regard to management of this graft.  I would recommend completing an initial abx course in the hospital.  This would allow observation for any recurrence of significant bleeding from left groin.  An exploration of this left groin would be needed to determine the exact cause of the bleeding.  Unfortunately, this exploration could seed the graft also.   The left groin has been opened >5 times, which makes any exploration very difficult and prone to complications.  Leonides Sake, MD [facility] Office: [phone] Pager: [phone]  03/24/2012, 8:20 AM

## 2012-03-24 NOTE — Progress Notes (Signed)
ANTIBIOTIC CONSULT NOTE - FOLLOW UP  Pharmacy Consult for vancomycin and imipenem Indication: empiric S/P L aortobifemoral limb and fem-fem thrombectomy, fasciotomies, STSG present with possible herald bleed HD 1      Allergies  Allergen Reactions  . Aspirin Hives  . Penicillins Rash and Other (See Comments)    Immune system shut down     Patient Measurements: Height: 5' 8.9" (175 cm) Weight: 231 lb 11.3 oz (105.1 kg) IBW/kg (Calculated) : 70.47    Vital Signs: Temp: 98.1 F (36.7 C) (11/05 0443) Temp src: Oral (11/05 0443) BP: 148/71 mmHg (11/05 0443) Pulse Rate: 75  (11/05 0443) Intake/Output from previous day: 11/04 0701 - 11/05 0700 In: 840 [P.O.:840] Out: 1650 [Urine:1650] Intake/Output from this shift: Total I/O In: -  Out: 1425 [Urine:1425]  Labs:  [facility] 03/24/12 0455 03/23/12 1034 03/22/12 0823 03/21/12 0620  WBC 9.0 11.7* 8.0 --  HGB 10.1* 10.7* 7.5* --  PLT 266 282 249 --  LABCREA -- -- -- --  CREATININE -- -- -- 1.17   Estimated Creatinine Clearance: 72.1 ml/min (by C-G formula based on Cr of 1.17).  Basename 03/24/12 0455  VANCOTROUGH 16.3  VANCOPEAK --  VANCORANDOM --  GENTTROUGH --  GENTPEAK --  GENTRANDOM --  TOBRATROUGH --  TOBRAPEAK --  TOBRARND --  AMIKACINPEAK --  AMIKACINTROU --  AMIKACIN --     Microbiology: No results found for this or any previous visit (from the past 720 hour(s)).  Assessment: 66 yo man on day # 4 of vancomycin and imipenem empiric therapy S/P L aortobifemoral limb and fem-fem thrombectomy, fasciotomies, STSG present with possible herald bleed HD 1. His WBC is 9 - trending down. He is afebrile. No current culture data available.  Creatinine remains stable. Vancomycin trough 16.3 mcg/ml (slightly above goal but ok) Noted ID MD recs to change to po antibiotics.  Goal of Therapy:  Vancomycin trough level 10-15 mcg/ml  Plan:  1. Continue imipenem 500 mg IV q6h 2. Continue vancomycin 1000 mg IV  q12h 3. F/u change to po antibiotics  Christoper Fabian, PharmD, BCPS Clinical pharmacist, pager [phone] 03/24/2012 6:18 AM

## 2012-03-24 NOTE — Progress Notes (Signed)
Dressing to bilateral groin changed per MD order. R groin site clean and dry. L groin with moderate amount of blood on dressing. Will continue to monitor. Logan [name]

## 2012-03-24 NOTE — Progress Notes (Signed)
Spoke with Mr Sahagun at bedside. States he does plan to return home with his wife. Was active with [facility] prior to admission. States he appreciates visit and will continue to be active with [facility] Care Management services. [facility] Care Management services do not interfere or replace services arranged by inpatient case management.   Raiford Noble, MSN, RN, BSN [facility] Liaison [phone]

## 2012-03-25 LAB — GLUCOSE, CAPILLARY: Glucose-Capillary: 292 mg/dL — ABNORMAL HIGH (ref 70–99)

## 2012-03-25 NOTE — Progress Notes (Signed)
[facility]    Date of Admission:  03/20/2012   Total days of antibiotics 6           ID: Logan [name] is a 66 y.o. male  poorly healing groin incision sites from recent revascularization surgery that appears to have superficial wound infection. CT scan does not appear to have fistulous track nor fluid collection around vascular graft.  Active Problems:  * No active hospital problems. *     Subjective: No bleeding from wound site.  Medications:     . amLODipine  2.5 mg Oral Daily  . aspirin EC  81 mg Oral Daily  . ciprofloxacin  500 mg Oral BID  . clopidogrel  75 mg Oral Q breakfast  . [EXPIRED] furosemide      . glimepiride  4 mg Oral BID  . indomethacin  50 mg Oral TID WC  . insulin aspart  0-15 Units Subcutaneous TID WC  . insulin glargine  60 Units Subcutaneous Daily  . lisinopril  40 mg Oral Daily  . metoprolol  50 mg Oral BID  . metroNIDAZOLE  500 mg Oral Q8H  . omega-3 acid ethyl esters  1 g Oral Daily  . pantoprazole  40 mg Oral Daily  . pioglitazone  30 mg Oral Daily  . potassium chloride  20-40 mEq Oral Once  . simvastatin  20 mg Oral QPM  . sulfamethoxazole-trimethoprim  1 tablet Oral Q12H    Objective: Vital signs in last 24 hours: Temp:  [98.1 F (36.7 C)-98.5 F (36.9 C)] 98.1 F (36.7 C) (11/06 2100) Pulse Rate:  [68-84] 84  (11/06 2100) Resp:  [17-18] 17  (11/06 2100) BP: (128-137)/(66-75) 128/66 mmHg (11/06 2100) SpO2:  [96 %-100 %] 100 % (11/06 2100) Physical Exam  Constitutional: He is oriented to person, place, and time. He appears well-developed and well-nourished. No distress.  HENT:  Mouth/Throat: Oropharynx is clear and moist. No oropharyngeal exudate.  Cardiovascular: Normal rate, regular rhythm and normal heart sounds. Exam reveals no gallop and no friction rub.  No murmur heard.  Pulmonary/Chest: Effort normal and breath sounds normal. No respiratory distress. He has no wheezes.  Abdominal: Soft. Bowel  sounds are normal. He exhibits no distension. There is no tenderness.  Lymphadenopathy:  He has no cervical adenopathy.  Neurological: He is alert and oriented to person, place, and time.  Skin: left groin lesion has good granulation tissue. No exudate, no foul smell Psychiatric: He has a normal mood and affect. His behavior is normal.    Lab Results  Basename 03/24/12 0455 03/23/12 1034  WBC 9.0 11.7*  HGB 10.1* 10.7*  HCT 29.8* 31.9*  NA 140 --  K 4.3 --  CL 106 --  CO2 24 --  BUN 17 --  CREATININE 1.15 --  GLU -- --   Liver Panel No results found for this basename: PROT:2,ALBUMIN:2,AST:2,ALT:2,ALKPHOS:2,BILITOT:2,BILIDIR:2,IBILI:2 in the last 72 hours Sedimentation Rate No results found for this basename: ESRSEDRATE in the last 72 hours C-Reactive Protein No results found for this basename: CRP:2 in the last 72 hours  Microbiology:  Studies/Results: No results found.   Assessment/Plan: 66yo Male with poorly healing groin incision sites from recent revascularization surgery that appears to have superficial wound infection. CT scan does not appear to have fistulous track nor fluid collection around vascular graft.  - wound is starting to heal, has clean wound bed, continue with wet to dry dressing changes - continue with 4 more days  of cipro 500mg  BID, bactrim DS 1 tab BID, and metronidazole 500mg  TID to finish 10 day course of antibiotics  Will sign off. Call if questions   Drue [facility] for [facility] Diseases Cell: [phone] Pager: [phone]  03/25/2012, 10:05 PM

## 2012-03-25 NOTE — Progress Notes (Addendum)
[facility] [place]  Subjective   Consult with Dr. Drue Second infectious disease: recommend to treat for 10 days with oral antibiotics and local wound care.  bactrim 1 DS BID, cipro 500mg  PO BID, and metronidazole 500mg  PO TID. This would cover broad spectrum of skin flora and enteric flora.  He is ambulatory independent in the halls.  There is min. Bleeding on the left groin area and no bleeding on the right groin area.  Continued edema bilateral feet.  One dose of lasix was given yesterday.    Objective 137/75 68 98.5 F (36.9 C) (Oral) 18 96%  Intake/Output Summary (Last 24 hours) at 03/25/12 0805 Last data filed at 03/25/12 0450  Gross per 24 hour  Intake    720 ml  Output   1850 ml  Net  -1130 ml   Right groin dressing clean and dry. Min. Bloody drainage left groin when dressing was changed.  Right foot with moderate edema. Skin in both feet warm to touch. Right lower leg dressing over skin graft clean and dry.       Assessment/Planning:  I will discuss further work and treatment plans with Dr. Hart Rochester. bactrim 1 DS BID, cipro 500mg  PO BID, and metronidazole 500mg  PO TID. This would cover broad spectrum of skin flora and enteric flora. Wound cultures sent today  [facility] 03/25/2012 8:05 AM --  Laboratory Lab Results:  Basename 03/24/12 0455 03/23/12 1034  WBC 9.0 11.7*  HGB 10.1* 10.7*  HCT 29.8* 31.9*  PLT 266 282   BMET  Basename 03/24/12 0455  NA 140  K 4.3  CL 106  CO2 24  GLUCOSE 212*  BUN 17  CREATININE 1.15  CALCIUM 9.2    COAG Lab Results  Component Value Date   INR 0.97 12/20/2011   No results found for this basename: PTT    Antibiotics Anti-infectives     Start     Dose/Rate Route Frequency Ordered Stop   03/24/12 1000  sulfamethoxazole-trimethoprim (BACTRIM DS) 800-160 MG per tablet 1 tablet       1 tablet Oral Every 12 hours 03/24/12 0817     03/24/12 0945   metroNIDAZOLE (FLAGYL) tablet 500 mg         500 mg Oral 3 times per day 03/24/12 0817     03/24/12 0930   ciprofloxacin (CIPRO) tablet 500 mg        500 mg Oral 2 times daily 03/24/12 0817     03/21/12 0600   vancomycin (VANCOCIN) IVPB 1000 mg/200 mL premix  Status:  Discontinued        1,000 mg 200 mL/hr over 60 Minutes Intravenous Every 12 hours 03/20/12 1722 03/24/12 0816   03/20/12 1800   imipenem-cilastatin (PRIMAXIN) 500 mg in sodium chloride 0.9 % 100 mL IVPB  Status:  Discontinued        500 mg 200 mL/hr over 30 Minutes Intravenous 4 times per day 03/20/12 1733 03/24/12 0816   03/20/12 1730   vancomycin (VANCOCIN) 2,000 mg in sodium chloride 0.9 % 500 mL IVPB  Status:  Discontinued        2,000 mg 250 mL/hr over 120 Minutes Intravenous  Once 03/20/12 1722 03/24/12 0816   03/20/12 1715   vancomycin (VANCOCIN) IVPB 1000 mg/200 mL premix  Status:  Discontinued        1,000 mg 200 mL/hr over 60 Minutes Intravenous Every 12 hours 03/20/12 1713 03/20/12 1722  Left inguinal wound examined--- small fistula tract in the midportion of left femoral wound from recent surgery performed by Dr. Imogene Burn--- thrombectomy of left limb aortofemoral and left right femoral-femoral graft. This may well represent graft infection but no definite evidence to prove this and exploration of left inguinal wound would certainly lead to infection no evidence of pseudoaneurysm on CT angiogram. No active bleeding over last several days but slight bloody drainage is present. Discussed situation with patient and the fact that there is not a perfect answer to this problem i.e. exploration of left inguinal wound which will likely lead to infection versus continued observation which could lead to further bleeding  Patient is agreeable to continued observation and understands potential risks of bleeding.   Continue antibiotics and observation for now

## 2012-03-25 NOTE — Progress Notes (Signed)
Dressing to bilateral groin changed. L side with minimal amount of blood. R side clean and dry. Dion Saucier

## 2012-03-25 NOTE — Progress Notes (Signed)
Pt refused to checked CBG schedule at 2200.

## 2012-03-26 LAB — GLUCOSE, CAPILLARY
Glucose-Capillary: 140 mg/dL — ABNORMAL HIGH (ref 70–99)
Glucose-Capillary: 325 mg/dL — ABNORMAL HIGH (ref 70–99)

## 2012-03-26 MED ORDER — BAG BALM OINTMENT
TOPICAL_OINTMENT | Freq: Every day | CUTANEOUS | Status: DC
  Administered 2012-03-26 – 2012-03-30 (×4): via TOPICAL
  Filled 2012-03-26: qty 300

## 2012-03-26 MED ORDER — GLIMEPIRIDE 4 MG PO TABS
4.0000 mg | ORAL_TABLET | Freq: Two times a day (BID) | ORAL | Status: DC
  Administered 2012-03-26 – 2012-03-31 (×10): 4 mg via ORAL
  Filled 2012-03-26 (×12): qty 1

## 2012-03-26 MED ORDER — SILVER NITRATE-POT NITRATE 75-25 % EX MISC
1.0000 "application " | Freq: Once | CUTANEOUS | Status: AC
  Administered 2012-03-26: 1 via TOPICAL
  Filled 2012-03-26: qty 1

## 2012-03-26 NOTE — Progress Notes (Signed)
Subjective: Pt is seen in follow up of STSG to right lower leg and bilateral groin wounds. He has been followed at the [facility] for  poorly healing groin incision sites from recent revascularization surgery . He recently underwent a STSG to right lower extremity and Acell placement to small bilateral groin wounds. He was seen in the clinic and referred back to Vascular Surgery due to an episode of bleeding from the left groin wound. He was admitted for concerns for herald bleed from the left groin with concerns for infected graft or possible pseudoaneurysm.   He underwent a  CT scan which does not appear to have fistulous track nor fluid collection around vascular graft. ID is following and it was felt that he had a superficial infection in the left groin wound and he has not had any significant recurrent bleeding from the left groin since admission.    Objective: Vital signs in last 24 hours: Temp:  [98.1 F (36.7 C)-98.4 F (36.9 C)] 98.4 F (36.9 C) (11/07 0607) Pulse Rate:  [74-84] 74  (11/07 0607) Resp:  [17-19] 19  (11/07 0607) BP: (128-146)/(66-67) 146/67 mmHg (11/07 0607) SpO2:  [97 %-100 %] 97 % (11/07 0607) Last BM Date: 03/24/12  Intake/Output from previous day: 11/06 0701 - 11/07 0700 In: 600 [P.O.:600] Out: 1550 [Urine:1550] Intake/Output this shift: Total I/O In: 360 [P.O.:360] Out: 400 [Urine:400]  General appearance: alert, cooperative, appears stated age and no distress Resp: clear to auscultation bilaterally Cardio: regular rate and rhythm The right lower extremity STSG appears dissicated but continues to heal. There is no drainage, warmth or erythema of the area. The harvest site is well healed.  The right groin site- has tiny open area with scant drainage but otherwise no signs of infection. The left groin site had a small wound which has pink granulation and very minimal slough with scant drainage.     Lab Results:   [facility] 03/24/12 0455  WBC  9.0  HGB 10.1*  HCT 29.8*  PLT 266   BMET  Basename 03/24/12 0455  NA 140  K 4.3  CL 106  CO2 24  GLUCOSE 212*  BUN 17  CREATININE 1.15  CALCIUM 9.2   PT/INR No results found for this basename: LABPROT:2,INR:2 in the last 72 hours ABG No results found for this basename: PHART:2,PCO2:2,PO2:2,HCO3:2 in the last 72 hours  Studies/Results: No results found.  Anti-infectives: Anti-infectives     Start     Dose/Rate Route Frequency Ordered Stop   03/24/12 1000  sulfamethoxazole-trimethoprim (BACTRIM DS) 800-160 MG per tablet 1 tablet       1 tablet Oral Every 12 hours 03/24/12 0817     03/24/12 0945   metroNIDAZOLE (FLAGYL) tablet 500 mg        500 mg Oral 3 times per day 03/24/12 0817     03/24/12 0930   ciprofloxacin (CIPRO) tablet 500 mg        500 mg Oral 2 times daily 03/24/12 0817     03/21/12 0600   vancomycin (VANCOCIN) IVPB 1000 mg/200 mL premix  Status:  Discontinued        1,000 mg 200 mL/hr over 60 Minutes Intravenous Every 12 hours 03/20/12 1722 03/24/12 0816   03/20/12 1800   imipenem-cilastatin (PRIMAXIN) 500 mg in sodium chloride 0.9 % 100 mL IVPB  Status:  Discontinued        500 mg 200 mL/hr over 30 Minutes Intravenous 4 times per day 03/20/12 1733 03/24/12 0816  03/20/12 1730   vancomycin (VANCOCIN) 2,000 mg in sodium chloride 0.9 % 500 mL IVPB  Status:  Discontinued        2,000 mg 250 mL/hr over 120 Minutes Intravenous  Once 03/20/12 1722 03/24/12 0816   03/20/12 1715   vancomycin (VANCOCIN) IVPB 1000 mg/200 mL premix  Status:  Discontinued        1,000 mg 200 mL/hr over 60 Minutes Intravenous Every 12 hours 03/20/12 1713 03/20/12 1722          A/P:  Superficial wound infection left groin- Continue local care with dressing changes and antibiotics  Right lower leg STSG- Wound bed is dried out, Will start Bag Balm daily to area.  Have patient  Follow up in the Wound Clinic once discharged.      LOS: 6 days     RAYBURN,SHAWN,PA-C Plastic Surgery [phone]

## 2012-03-26 NOTE — Progress Notes (Signed)
Inpatient Diabetes Program Recommendations  AACE/ADA: New Consensus Statement on Inpatient Glycemic Control (2013)  Target Ranges:  Prepandial:   less than 140 mg/dL      Peak postprandial:   less than 180 mg/dL (1-2 hours)      Critically ill patients:  140 - 180 mg/dL  Results for Logan [name], Logan [name] (MRN [id number]) as of 03/26/2012 12:14  Ref. Range 03/25/2012 06:41 03/25/2012 11:46 03/25/2012 16:17 03/26/2012 06:08 03/26/2012 11:30  Glucose-Capillary Latest Range: 70-99 mg/dL 409 (H) 811 (H) 914 (H) 140 (H) 325 (H)    Inpatient Diabetes Program Recommendations Insulin - Basal: Increase Lantus to 65 units  Insulin - Meal Coverage: Consider adding Novolog 4-5 units TID with meals for elevated post prandial CBGs Oral Agents: Change AMARYL to BID with meals not HS Spoke with pharmacist and she will change timing of Amaryl to with meals.   Thank you  Piedad Climes RN,BSN,CDE Inpatient Diabetes Coordinator [phone] (team pager)

## 2012-03-27 LAB — WOUND CULTURE: Culture: NO GROWTH

## 2012-03-27 LAB — GLUCOSE, CAPILLARY
Glucose-Capillary: 212 mg/dL — ABNORMAL HIGH (ref 70–99)
Glucose-Capillary: 314 mg/dL — ABNORMAL HIGH (ref 70–99)

## 2012-03-27 NOTE — Progress Notes (Signed)
Patient ID: Logan [name], male   DOB: [birth year], 66 y.o.   MRN: [id number] Vascular Surgery Progress Note  Subjective: Small nonhealing area left inguinal region with history of bleeding-no bleeding recently. Patient continues to have daily dressing changes with no evidence of bleeding over the last several days. There is mild purulent drainage from the left angle area.  Objective:  Filed Vitals:   03/27/12 1156  BP: 131/64  Pulse:   Temp:   Resp:     General alert and oriented x3 Left inguinal area with small opening measuring 0.5 x 1 cm. No pulsatile mass noted. Unable to express purulent drainage from wound although there is some pertinent drainage on dressing. Both feet well perfused  Labs:  Lab 03/24/12 0455 03/21/12 0620 03/20/12 1402  CREATININE 1.15 1.17 1.20    Lab 03/24/12 0455 03/21/12 0620 03/20/12 1402  NA 140 141 143  K 4.3 3.9 4.2  CL 106 107 106  CO2 24 26 --  BUN 17 15 15   CREATININE 1.15 1.17 1.20  LABGLOM -- -- --  GLUCOSE 212* -- --  CALCIUM 9.2 8.8 --    Lab 03/24/12 0455 03/23/12 1034 03/22/12 0823  WBC 9.0 11.7* 8.0  HGB 10.1* 10.7* 7.5*  HCT 29.8* 31.9* 23.1*  PLT 266 282 249   No results found for this basename: INR:3 in the last 168 hours  I/O last 3 completed shifts: In: 840 [P.O.:840] Out: 2475 [Urine:2475]  Imaging: No results found.  Assessment/Plan:  POD #7  LOS: 7 days  s/p   Plan is to continue observation through weekend for any sign of arterial bleeding from left inguinal area. If this occurs patient will need exploration left inguinal wound. If not will discharge patient first of week on oral antibiotics and followup in office. I discussed this at length with patient. He knows that there is risk of bleeding without surgery and significant risk of infected graft with surgery and he agrees with plan as stated above   Josephina Gip, MD 03/27/2012 1:19 PM

## 2012-03-27 NOTE — Progress Notes (Signed)
Inpatient Diabetes Program Recommendations  AACE/ADA: New Consensus Statement on Inpatient Glycemic Control (2013)  Target Ranges:  Prepandial:   less than 140 mg/dL      Peak postprandial:   less than 180 mg/dL (1-2 hours)      Critically ill patients:  140 - 180 mg/dL   Results for Logan [name], Logan [name] (MRN [id number]) as of 03/27/2012 14:32  Ref. Range 03/27/2012 06:24 03/27/2012 11:23  Glucose-Capillary Latest Range: 70-99 mg/dL 295 (H) 621 (H)    Inpatient Diabetes Program Recommendations Insulin - Basal: Increase Lantus to 65 units  Insulin - Meal Coverage: Consider adding Novolog 4-5 units TID with meals for elevated post prandial CBGs Oral Agents: . Thank you  Piedad Climes [facility] Inpatient Diabetes Coordinator [phone]

## 2012-03-28 LAB — GLUCOSE, CAPILLARY: Glucose-Capillary: 257 mg/dL — ABNORMAL HIGH (ref 70–99)

## 2012-03-28 NOTE — Progress Notes (Signed)
VASCULAR PROGRESS NOTE  SUBJECTIVE: No complaints  PHYSICAL EXAM: Filed Vitals:   03/27/12 1156 03/27/12 1345 03/27/12 2120 03/28/12 0411  BP: 131/64 140/68 154/65 121/71  Pulse:  70 75 73  Temp:  98 F (36.7 C) 97.6 F (36.4 C) 98.5 F (36.9 C)  TempSrc:  Oral Oral Oral  Resp:  18 20 19   Height:      Weight:      SpO2:  100% 100% 97%   Left groin wound inspected. Minimal drainage.   CBG (last 3)   Basename 03/28/12 0602 03/27/12 2119 03/27/12 1615  GLUCAP 216* 314* 212*   ASSESSMENT/PLAN: 1. Continue dressing changes.  2. Continue po Cipro, Bactrim, and Flagyl. 3. Would favor long term po antibiotics (ID had recommended 10 days)  Waverly Ferrari, MD, FACS Beeper: [phone] 03/28/2012

## 2012-03-28 NOTE — Progress Notes (Signed)
Pt's IV was out of date and infiltrated when flushed, so IV was removed. Pt refused to have another IV placed.  Alfonso Ellis, RN

## 2012-03-28 NOTE — Progress Notes (Signed)
Agree with the above information 

## 2012-03-29 LAB — GLUCOSE, CAPILLARY: Glucose-Capillary: 241 mg/dL — ABNORMAL HIGH (ref 70–99)

## 2012-03-29 MED ORDER — ASPIRIN 81 MG PO CHEW
CHEWABLE_TABLET | ORAL | Status: AC
  Filled 2012-03-29: qty 1

## 2012-03-29 NOTE — Progress Notes (Signed)
VASCULAR PROGRESS NOTE  SUBJECTIVE: No complaints.   PHYSICAL EXAM: Filed Vitals:   03/28/12 1016 03/28/12 1300 03/28/12 2024 03/29/12 0412  BP: 122/42 115/67 136/66 124/58  Pulse: 86 73 73 79  Temp:  98.6 F (37 C) 98.4 F (36.9 C) 98.6 F (37 C)  TempSrc:  Oral Oral Oral  Resp:  18 19 19   Height:      Weight:      SpO2:  100% 98% 98%   Dressing with minimal drainage.   CBG (last 3)   Basename 03/29/12 0620 03/28/12 1624 03/28/12 1125  GLUCAP 261* 257* 264*   ASSESSMENT/PLAN: 1.No bleeding. On po Cipro, Bactrim, and Flagyl.  2. F/U CBC tomorrow.  3. Possibly home soon with continued po antibiotics.   Waverly Ferrari, MD, FACS Beeper: [phone] 03/29/2012

## 2012-03-30 ENCOUNTER — Telehealth: Payer: Self-pay | Admitting: Vascular Surgery

## 2012-03-30 LAB — GLUCOSE, CAPILLARY: Glucose-Capillary: 294 mg/dL — ABNORMAL HIGH (ref 70–99)

## 2012-03-30 MED ORDER — BAG BALM OINTMENT: TOPICAL_OINTMENT | Freq: Every day | CUTANEOUS | Status: DC

## 2012-03-30 MED ORDER — OXYCODONE-ACETAMINOPHEN 5-325 MG PO TABS: 1.0000 | ORAL_TABLET | ORAL | Status: DC | PRN

## 2012-03-30 MED ORDER — CIPROFLOXACIN HCL 500 MG PO TABS: 500.0000 mg | ORAL_TABLET | Freq: Two times a day (BID) | ORAL | Status: DC

## 2012-03-30 MED ORDER — SULFAMETHOXAZOLE-TMP DS 800-160 MG PO TABS: 1.0000 | ORAL_TABLET | Freq: Two times a day (BID) | ORAL | Status: DC

## 2012-03-30 MED ORDER — METFORMIN HCL 500 MG PO TABS
1000.0000 mg | ORAL_TABLET | Freq: Two times a day (BID) | ORAL | Status: DC
  Filled 2012-03-30 (×4): qty 2

## 2012-03-30 MED ORDER — METRONIDAZOLE 500 MG PO TABS: 500.0000 mg | ORAL_TABLET | Freq: Three times a day (TID) | ORAL | Status: DC

## 2012-03-30 NOTE — Discharge Summary (Signed)
[facility] Discharge Summary   Patient ID:  Logan [name] MRN: [id number] DOB/AGE: [birth year] 66 y.o.  Admit date: 03/20/2012 Discharge date: 03/31/12 Date of Surgery: 12/21/11 Surgeon: Loraine Maple, MD 1. Left aortobifemoral limb thrombectomy 2. Femorofemoral graft thrombectomy 3. Revision of femorofemoral graft with interposition graft 4. Right tibial thrombectomy 5. Right four-compartment fasciotomies 6. Bilateral leg runoff Admission Diagnosis: Bleeding from left groin wound  Discharge Diagnoses:  Bleeding from left groin wound  Secondary Diagnoses: Past Medical History  Diagnosis Date  . Overweight   . Dyslipidemia   . Cardiomyopathy   . Diabetes mellitus   . PVD (peripheral vascular disease)     right to left fem -fem bypass 1997/ aortabifemoral bypass 2005 right to left fem-fem 07/2004. Secondary to occluded righ tlimb of aortobifemoral bypass  . Tobacco abuse   . Anginal pain   . Hypertension   . Shortness of breath   . Coronary artery disease     taxis DES stent circumflex 02/2003.... residual total distal circumflex and 50% LAD/ cardiolite 08/2006 inferior scar no ischemia      Discharged Condition: good  HPI:  [name] is a 66 y.o. (01/30/1946) male who presents with chief complaint: profuse bleeding from left groin. The patient and family note recurrent bleeding from left groin, requiring suture in left groin at OSH. This patient is a long time patient of Dr. Hart Rochester and Dr. Arbie Cookey, having undergone >8 operation for revascularization included multiple femorofemoral bypass and an aortobifemoral bypass. Most recently emergently, this patient had occlusion of the remaining left aortobifemoral limb and required:    Hospital Course:  [name] is a 66 y.o. male is S/P  Date of Surgery: 12/20/2011  7. Left aortobifemoral limb thrombectomy 8. Femorofemoral graft thrombectomy 9. Revision of femorofemoral graft with interposition graft 10. Right  tibial thrombectomy 11. Right four-compartment fasciotomies 12. Bilateral leg runoff Pt was admitted for wound care and bleeding from left groin wound. The bleeding resolved and wound continued to drain some purulent material All cultures have been negative and pt continues on cipro,flagyl and bactrim  Post-op wounds healing well with less purulent drainage from left groin. Right groin wound well healed. Silver nitrate placed in wound to help with healing Pt. Ambulating, voiding and taking PO diet without difficulty. Pt pain controlled with PO pain meds. Labs as below Complications:none  Consults:     Significant Diagnostic Studies: CBC Lab Results  Component Value Date   WBC 9.0 03/24/2012   HGB 10.1* 03/24/2012   HCT 29.8* 03/24/2012   MCV 86.6 03/24/2012   PLT 266 03/24/2012    BMET    Component Value Date/Time   NA 140 03/24/2012 0455   K 4.3 03/24/2012 0455   CL 106 03/24/2012 0455   CO2 24 03/24/2012 0455   GLUCOSE 212* 03/24/2012 0455   BUN 17 03/24/2012 0455   CREATININE 1.15 03/24/2012 0455   CALCIUM 9.2 03/24/2012 0455   GFRNONAA 64* 03/24/2012 0455   GFRAA 74* 03/24/2012 0455   COAG Lab Results  Component Value Date   INR 0.97 12/20/2011     Disposition:  Discharge to :Home Discharge Orders    Future Orders Please Complete By Expires   Resume previous diet      Driving Restrictions      Comments:   No driving   Call MD for:  temperature >100.5      Call MD for:  redness, tenderness, or signs of infection (pain, swelling,  bleeding, redness, odor or green/yellow discharge around incision site)      Call MD for:  severe or increased pain, loss or decreased feeling  in affected limb(s)      Increase activity slowly      Comments:   Walk with assistance use walker or cane as needed   Discharge wound care:      Comments:   Paint left groin wound with betadine twice daily and apply Wet to dry dressings left groin Dry dressing to right groin Right thigh and skin  graft wound care unchanged      [name], [name]  Home Medication Instructions AVW:[id number]   Printed on:03/30/12 1318  Medication Information                    Omega-3 Fatty Acids (FISH OIL) 1000 MG CAPS Take 1 capsule by mouth 2 (two) times daily.            glimepiride (AMARYL) 4 MG tablet Take 4 mg by mouth 2 (two) times daily.           lisinopril (PRINIVIL,ZESTRIL) 40 MG tablet Take 40 mg by mouth daily.           metFORMIN (GLUCOPHAGE) 500 MG tablet Take 1,000 mg by mouth 2 (two) times daily with a meal.           metoprolol (LOPRESSOR) 50 MG tablet Take 50 mg by mouth 2 (two) times daily.           Multiple Vitamins-Minerals (MULTIVITAMIN PO) Take 1 tablet by mouth daily.            simvastatin (ZOCOR) 20 MG tablet Take 20 mg by mouth every evening.           aspirin EC 81 MG tablet Take 81 mg by mouth daily.           clopidogrel (PLAVIX) 75 MG tablet Take 1 tablet (75 mg total) by mouth daily with breakfast.           insulin aspart (NOVOLOG) 100 UNIT/ML injection Inject 0-15 Units into the skin 3 (three) times daily with meals.           omeprazole (PRILOSEC) 20 MG capsule Take 20 mg by mouth daily.           indomethacin (INDOCIN) 50 MG capsule Take 50 mg by mouth 3 (three) times daily with meals.           amLODipine (NORVASC) 2.5 MG tablet Take 2.5 mg by mouth daily.           insulin glargine (LANTUS) 100 UNIT/ML injection Inject 40-70 Units into the skin 2 (two) times daily. 70 units every morning and 40 units every evening           ciprofloxacin (CIPRO) 500 MG tablet Take 1 tablet (500 mg total) by mouth 2 (two) times daily.           bag balm OINT ointment Apply topically daily at 3 pm. Send jar home with pt           metroNIDAZOLE (FLAGYL) 500 MG tablet Take 1 tablet (500 mg total) by mouth every 8 (eight) hours.           sulfamethoxazole-trimethoprim (BACTRIM DS) 800-160 MG per tablet Take 1 tablet by mouth every 12 (twelve) hours.            oxyCODONE-acetaminophen (PERCOCET/ROXICET) 5-325 MG per tablet Take 1 tablet by mouth  every 4 (four) hours as needed. For pain            Verbal and written Discharge instructions given to the patient. Wound care per Discharge AVS Follow-up Information    Follow up with Josephina Gip, MD. In 3 weeks. (office will arrange - sent)    Contact information:   [address] [place] [address] [phone]       Call to follow up.   Contact information:   please Follow up in wound clinic for right leg skin graft as directed         Signed: Tashawnda Bleiler J 03/30/2012, 1:18 PM

## 2012-03-30 NOTE — Progress Notes (Signed)
Pt refused second dressing change of left groin. Pt stated" that it was already changed today" Pt was made aware that the order was for it to be changed twice a day.   Logan [name] M

## 2012-03-30 NOTE — Progress Notes (Addendum)
[facility] [place]  Progress Note Bypass Surgery  History of Present Illness  Logan [name] is a 66 y.o. male who is S/P fem-fem bypass who came in with drainage and bleeding from left groin. The patient's pre-op symptoms of bleedingare Improved . Patients pain is well controlled.  Pt is afebrile , still having purulent drainage from left groin  Significant Diagnostic Studies: CBC Lab Results  Component Value Date   WBC 9.0 03/24/2012   HGB 10.1* 03/24/2012   HCT 29.8* 03/24/2012   MCV 86.6 03/24/2012   PLT 266 03/24/2012    BMET     Component Value Date/Time   NA 140 03/24/2012 0455   K 4.3 03/24/2012 0455   CL 106 03/24/2012 0455   CO2 24 03/24/2012 0455   GLUCOSE 212* 03/24/2012 0455   BUN 17 03/24/2012 0455   CREATININE 1.15 03/24/2012 0455   CALCIUM 9.2 03/24/2012 0455   GFRNONAA 64* 03/24/2012 0455   GFRAA 74* 03/24/2012 0455    COAG Lab Results  Component Value Date   INR 0.97 12/20/2011   No results found for this basename: PTT    Physical Examination  BP Readings from Last 3 Encounters:  03/30/12 122/66  03/20/12 166/77  02/17/12 132/70   Temp Readings from Last 3 Encounters:  03/30/12 98.6 F (37 C) Oral  03/20/12 98.8 F (37.1 C) Oral  02/17/12 97.3 F (36.3 C) Oral   SpO2 Readings from Last 3 Encounters:  03/30/12 98%  03/20/12 99%  02/17/12 100%   Pulse Readings from Last 3 Encounters:  03/30/12 78  03/20/12 79  02/17/12 72    Pt is A&O x 3 right lower extremity: Incision/s is/are clean, dry, intact or clean,dry.intact, and  healing without hematoma, erythema or drainage Left groin with less purulent drainage, no bleeding noted, wound stable/clean Both Limb is warm; with good color Assessment/Plan: Pt. Doing well Post-op pain is controlled Wounds are stable PT/OT for ambulation Continue wound care as ordered  Marlowe Shores [phone] 03/30/2012 7:47 AM        Agree with above  Plan DC home in am if no  bleeding---Needs HHN for wound care post DC

## 2012-03-30 NOTE — Progress Notes (Signed)
Inpatient Diabetes Program Recommendations  AACE/ADA: New Consensus Statement on Inpatient Glycemic Control (2013)  Target Ranges:  Prepandial:   less than 140 mg/dL      Peak postprandial:   less than 180 mg/dL (1-2 hours)      Critically ill patients:  140 - 180 mg/dL   Reason for Visit: Sustained hyperglycemia  Inpatient Diabetes Program Recommendations Insulin - Basal: Increase Lantus to 70 units please. Correction (SSI): Increase to resistant and add HS correction scale.  Insulin - Meal Coverage: xxx Oral Agents: .  Note: Thank you, Lenor Coffin, RN, CNS, Diabetes Coordinator [phone])

## 2012-03-30 NOTE — Telephone Encounter (Signed)
Could not leave message, sent letter, dpm

## 2012-03-30 NOTE — Progress Notes (Signed)
[facility]  Patient Status: Active (receiving services up to time of hospitalization)  [facility] is providing the following services: RN - has been an active pt of [facility] - nursing visits have been on hold since 10/31 visit per wound care center  If patient discharges after hours, please call [phone].   Mary Hickling 03/30/2012, 1:25 PM

## 2012-03-30 NOTE — Telephone Encounter (Signed)
Message copied by Fredrich Birks on Mon Mar 30, 2012  3:15 PM ------      Message from: Melene Plan      Created: Mon Mar 30, 2012  2:52 PM                   ----- Message -----         From: Marlowe Shores, [place]         Sent: 03/30/2012   1:14 PM           To: Melene Plan, RN            3 week F/U - left groin infection - wound check - lawson

## 2012-03-31 MED ORDER — INSULIN GLARGINE 100 UNIT/ML ~~LOC~~ SOLN: 40.0000 [IU] | Freq: Two times a day (BID) | SUBCUTANEOUS | Status: DC

## 2012-03-31 MED ORDER — INSULIN GLARGINE 100 UNIT/ML ~~LOC~~ SOLN: 70.0000 [IU] | Freq: Every day | SUBCUTANEOUS | Status: DC

## 2012-03-31 NOTE — Progress Notes (Signed)
Pt refused 2200 blood sugar check.   Logan [name] M

## 2012-03-31 NOTE — Progress Notes (Addendum)
[facility]  Progress Note Bypass Surgery   History of Present Illness  Logan [name] is a 66 y.o. male who is doing well. No bleeding from left groin. Home today on 3 more weeks of antibiotics  Cultures negative  Physical Examination  BP Readings from Last 3 Encounters:  03/31/12 98/52  03/20/12 166/77  02/17/12 132/70   Temp Readings from Last 3 Encounters:  03/31/12 98.2 F (36.8 C) Oral  03/20/12 98.8 F (37.1 C) Oral  02/17/12 97.3 F (36.3 C) Oral   SpO2 Readings from Last 3 Encounters:  03/31/12 97%  03/20/12 99%  02/17/12 100%   Pulse Readings from Last 3 Encounters:  03/31/12 72  03/20/12 79  02/17/12 72    Pt is A&O x 3 left lower extremity: Incision/s is/are draining, and  healing without bleeding Limb is warm; with good color   Assessment/Plan: Pt. Doing well Post-op pain is controlled Wounds are stable Continue wound care as ordered Home today  Marlowe Shores [phone] 03/31/2012 7:55 AM   Agree with above  No evidence of bleeding from left inguinal area Afebrile and wound culture-no growth DC home today with daily wound care return to office 3 weeks

## 2012-04-14 ENCOUNTER — Encounter: Payer: Self-pay | Admitting: *Deleted

## 2012-04-20 ENCOUNTER — Encounter: Payer: Self-pay | Admitting: Vascular Surgery

## 2012-04-21 ENCOUNTER — Ambulatory Visit (INDEPENDENT_AMBULATORY_CARE_PROVIDER_SITE_OTHER): Payer: Medicare Other | Admitting: Vascular Surgery

## 2012-04-21 ENCOUNTER — Encounter: Payer: Self-pay | Admitting: Vascular Surgery

## 2012-04-21 VITALS — BP 158/89 | HR 72 | Resp 20 | Ht 69.0 in | Wt 231.0 lb

## 2012-04-21 DIAGNOSIS — I739 Peripheral vascular disease, unspecified: Secondary | ICD-10-CM

## 2012-04-21 DIAGNOSIS — Z48812 Encounter for surgical aftercare following surgery on the circulatory system: Secondary | ICD-10-CM

## 2012-04-21 NOTE — Progress Notes (Signed)
Subjective:     Patient ID: Logan [name], male   DOB: September 09, 1945, 66 y.o.   MRN: [id number]  HPI this 66 year old male returns today for followup regarding his infection in the left inguinal area. Patient is very complicated hasn't aortobifemoral bypass with occlusion of the right limb. He has left right femoral-femoral bypass. He required extensive thrombectomy by Dr. Standley Brooking in August of this year with fasciotomies. He then presented with some drainage from the left inguinal area. CT scans have not revealed any evidence of infection of his grafts. He has continued to have an open sinus tract in the left inguinal area which is being treated with local wound care. He denies any chills or fever or night sweats.  Past Medical History  Diagnosis Date  . Overweight   . Dyslipidemia   . Cardiomyopathy   . Diabetes mellitus   . PVD (peripheral vascular disease)     right to left fem -fem bypass 1997/ aortabifemoral bypass 2005 right to left fem-fem 07/2004. Secondary to occluded righ tlimb of aortobifemoral bypass  . Tobacco abuse   . Anginal pain   . Hypertension   . Shortness of breath   . Coronary artery disease     taxis DES stent circumflex 02/2003.... residual total distal circumflex and 50% LAD/ cardiolite 08/2006 inferior scar no ischemia    History  Substance Use Topics  . Smoking status: Former Smoker -- 40 years    Types: Cigarettes    Quit date: 12/20/2011  . Smokeless tobacco: Never Used  . Alcohol Use: Yes     Comment: liquior sometimes    Family History  Problem Relation Age of Onset  . Hypertension Mother     Allergies  Allergen Reactions  . Aspirin Hives  . Penicillins Rash and Other (See Comments)    Immune system shut down     Current outpatient prescriptions:amLODipine (NORVASC) 2.5 MG tablet, Take 2.5 mg by mouth daily., Disp: , Rfl: ;  aspirin EC 81 MG tablet, Take 81 mg by mouth daily., Disp: , Rfl: ;  bag balm OINT ointment, Apply topically daily at 3 pm.  Send jar home with pt, Disp: , Rfl: ;  ciprofloxacin (CIPRO) 500 MG tablet, Take 1 tablet (500 mg total) by mouth 2 (two) times daily., Disp: 42 tablet, Rfl: 0 clopidogrel (PLAVIX) 75 MG tablet, Take 1 tablet (75 mg total) by mouth daily with breakfast., Disp: , Rfl: ;  glimepiride (AMARYL) 4 MG tablet, Take 4 mg by mouth 2 (two) times daily., Disp: , Rfl: ;  indomethacin (INDOCIN) 50 MG capsule, Take 50 mg by mouth 3 (three) times daily with meals., Disp: , Rfl: ;  insulin aspart (NOVOLOG) 100 UNIT/ML injection, Inject 0-15 Units into the skin 3 (three) times daily with meals., Disp: 1 vial, Rfl:  insulin glargine (LANTUS) 100 UNIT/ML injection, Inject 40-70 Units into the skin 2 (two) times daily. 70 units every morning and 40 units every evening, Disp: 10 mL, Rfl: ;  lisinopril (PRINIVIL,ZESTRIL) 40 MG tablet, Take 40 mg by mouth daily., Disp: , Rfl: ;  metoprolol (LOPRESSOR) 50 MG tablet, Take 50 mg by mouth 2 (two) times daily., Disp: , Rfl:  metroNIDAZOLE (FLAGYL) 500 MG tablet, Take 1 tablet (500 mg total) by mouth every 8 (eight) hours., Disp: 60 tablet, Rfl: 0;  Multiple Vitamins-Minerals (MULTIVITAMIN PO), Take 1 tablet by mouth daily. , Disp: , Rfl: ;  Omega-3 Fatty Acids (FISH OIL) 1000 MG CAPS, Take 1 capsule by mouth 2 (  two) times daily. , Disp: , Rfl: ;  omeprazole (PRILOSEC) 20 MG capsule, Take 20 mg by mouth daily., Disp: , Rfl:  oxyCODONE-acetaminophen (PERCOCET/ROXICET) 5-325 MG per tablet, Take 1 tablet by mouth every 4 (four) hours as needed. For pain, Disp: 20 tablet, Rfl: 0;  simvastatin (ZOCOR) 20 MG tablet, Take 20 mg by mouth every evening., Disp: , Rfl: ;  sulfamethoxazole-trimethoprim (BACTRIM DS) 800-160 MG per tablet, Take 1 tablet by mouth every 12 (twelve) hours., Disp: 42 tablet, Rfl: 0 metFORMIN (GLUCOPHAGE) 500 MG tablet, Take 1,000 mg by mouth 2 (two) times daily with a meal., Disp: , Rfl:   BP 158/89  Pulse 72  Resp 20  Ht 5\' 9"  (1.753 m)  Wt 231 lb (104.781 kg)  BMI  34.11 kg/m2  Body mass index is 34.11 kg/(m^2).             Review of Systems     Objective:   Physical ExamBP 158/89  Pulse 72  Resp 20  Ht 5\' 9"  (1.753 m)  Wt 231 lb (104.781 kg)  BMI 34.11 kg/m2  Gen. chronically ill-appearing male no apparent stress alert and oriented x3 Abdomen soft nontender with no masses. Lungs no rhonchi or wheezing Left inguinal area with 1 cm sinus tract with scant bloody drainage. No pulsatile mass or fluctuance beneath this. No erythema or tenderness across femoral-femoral graft and suprapubic area. 3+ pulse and femoral-femoral graft. Both feet well perfused.     Assessment:     Stable sinus tract left inguinal area overlying aortobifemoral and left right femoral-femoral graft. No definite evidence of graft infection at this time    Plan:     We'll continue local wound care. Had long discussion with patient and family regarding problem with exploring this area would be the graft would likely become infected and there are no good treatment options. Right axillofemoral graft could be performed to preserve right leg but no good revascularization options for left leg. We'll continue local wound care. They are aware of the risk of significant bleeding from left colon area if this should become overtly infected Return in 3 months

## 2012-04-22 NOTE — Addendum Note (Signed)
Addended by: Sharee Pimple on: 04/22/2012 11:22 AM   Modules accepted: Orders

## 2012-05-02 ENCOUNTER — Observation Stay (HOSPITAL_COMMUNITY): Payer: Medicare Other

## 2012-05-02 ENCOUNTER — Encounter (HOSPITAL_COMMUNITY): Payer: Self-pay | Admitting: Emergency Medicine

## 2012-05-02 ENCOUNTER — Emergency Department (HOSPITAL_COMMUNITY): Payer: Medicare Other

## 2012-05-02 ENCOUNTER — Observation Stay (HOSPITAL_COMMUNITY)
Admission: EM | Admit: 2012-05-02 | Discharge: 2012-05-02 | Disposition: A | Discharge status: Home / Self Care | Payer: Medicare Other | Attending: Emergency Medicine | Admitting: Emergency Medicine

## 2012-05-02 DIAGNOSIS — Z794 Long term (current) use of insulin: Secondary | ICD-10-CM | POA: Insufficient documentation

## 2012-05-02 DIAGNOSIS — I739 Peripheral vascular disease, unspecified: Secondary | ICD-10-CM | POA: Insufficient documentation

## 2012-05-02 DIAGNOSIS — E860 Dehydration: Secondary | ICD-10-CM | POA: Insufficient documentation

## 2012-05-02 DIAGNOSIS — I10 Essential (primary) hypertension: Secondary | ICD-10-CM | POA: Insufficient documentation

## 2012-05-02 DIAGNOSIS — Z87891 Personal history of nicotine dependence: Secondary | ICD-10-CM | POA: Insufficient documentation

## 2012-05-02 DIAGNOSIS — Y849 Medical procedure, unspecified as the cause of abnormal reaction of the patient, or of later complication, without mention of misadventure at the time of the procedure: Secondary | ICD-10-CM | POA: Insufficient documentation

## 2012-05-02 DIAGNOSIS — E785 Hyperlipidemia, unspecified: Secondary | ICD-10-CM | POA: Insufficient documentation

## 2012-05-02 DIAGNOSIS — I2510 Atherosclerotic heart disease of native coronary artery without angina pectoris: Secondary | ICD-10-CM | POA: Insufficient documentation

## 2012-05-02 DIAGNOSIS — Z79899 Other long term (current) drug therapy: Secondary | ICD-10-CM | POA: Insufficient documentation

## 2012-05-02 DIAGNOSIS — I428 Other cardiomyopathies: Secondary | ICD-10-CM | POA: Insufficient documentation

## 2012-05-02 DIAGNOSIS — M549 Dorsalgia, unspecified: Principal | ICD-10-CM | POA: Insufficient documentation

## 2012-05-02 DIAGNOSIS — T8189XA Other complications of procedures, not elsewhere classified, initial encounter: Secondary | ICD-10-CM | POA: Insufficient documentation

## 2012-05-02 DIAGNOSIS — E119 Type 2 diabetes mellitus without complications: Secondary | ICD-10-CM | POA: Insufficient documentation

## 2012-05-02 DIAGNOSIS — G8929 Other chronic pain: Secondary | ICD-10-CM | POA: Insufficient documentation

## 2012-05-02 HISTORY — DX: Other intervertebral disc degeneration, lumbar region: M51.36

## 2012-05-02 HISTORY — DX: Other intervertebral disc degeneration, lumbar region without mention of lumbar back pain or lower extremity pain: M51.369

## 2012-05-02 LAB — URINALYSIS, ROUTINE W REFLEX MICROSCOPIC
Bilirubin Urine: NEGATIVE
Hgb urine dipstick: NEGATIVE
Protein, ur: NEGATIVE mg/dL
Specific Gravity, Urine: 1.034 — ABNORMAL HIGH (ref 1.005–1.030)
Urobilinogen, UA: 0.2 mg/dL (ref 0.0–1.0)

## 2012-05-02 LAB — BASIC METABOLIC PANEL
BUN: 17 mg/dL (ref 6–23)
Calcium: 9.4 mg/dL (ref 8.4–10.5)
GFR calc non Af Amer: 60 mL/min — ABNORMAL LOW (ref >90)
Glucose, Bld: 310 mg/dL — ABNORMAL HIGH (ref 70–99)

## 2012-05-02 LAB — CBC WITH DIFFERENTIAL/PLATELET
Eosinophils Absolute: 0 10*3/uL (ref 0.0–0.7)
Eosinophils Relative: 0 % (ref 0–5)
Hemoglobin: 11.2 g/dL — ABNORMAL LOW (ref 13.0–17.0)
Lymphs Abs: 2.2 10*3/uL (ref 0.7–4.0)
MCH: 28.5 pg (ref 26.0–34.0)
MCV: 85.8 fL (ref 78.0–100.0)
Monocytes Relative: 10 % (ref 3–12)
RBC: 3.93 MIL/uL — ABNORMAL LOW (ref 4.22–5.81)

## 2012-05-02 LAB — GLUCOSE, CAPILLARY: Glucose-Capillary: 160 mg/dL — ABNORMAL HIGH (ref 70–99)

## 2012-05-02 MED ORDER — OXYCODONE-ACETAMINOPHEN 5-325 MG PO TABS: 1.0000 | ORAL_TABLET | Freq: Four times a day (QID) | ORAL | Status: DC | PRN

## 2012-05-02 MED ORDER — HYDROMORPHONE HCL PF 1 MG/ML IJ SOLN
1.0000 mg | Freq: Once | INTRAMUSCULAR | Status: AC
  Administered 2012-05-02: 1 mg via INTRAVENOUS
  Filled 2012-05-02: qty 1

## 2012-05-02 MED ORDER — INSULIN ASPART 100 UNIT/ML ~~LOC~~ SOLN
10.0000 [IU] | Freq: Once | SUBCUTANEOUS | Status: AC
  Administered 2012-05-02: 10 [IU] via INTRAVENOUS
  Filled 2012-05-02: qty 1

## 2012-05-02 MED ORDER — DIAZEPAM 5 MG/ML IJ SOLN
5.0000 mg | Freq: Once | INTRAMUSCULAR | Status: AC
  Administered 2012-05-02: 5 mg via INTRAVENOUS
  Filled 2012-05-02: qty 2

## 2012-05-02 MED ORDER — SODIUM CHLORIDE 0.9 % IV SOLN
Freq: Once | INTRAVENOUS | Status: AC
  Administered 2012-05-02: 15:00:00 via INTRAVENOUS

## 2012-05-02 MED ORDER — ACETAMINOPHEN 325 MG PO TABS
650.0000 mg | ORAL_TABLET | Freq: Once | ORAL | Status: AC
  Administered 2012-05-02: 650 mg via ORAL
  Filled 2012-05-02: qty 2

## 2012-05-02 MED ORDER — SODIUM CHLORIDE 0.9 % IV BOLUS (SEPSIS)
1000.0000 mL | Freq: Once | INTRAVENOUS | Status: AC
  Administered 2012-05-02: 1000 mL via INTRAVENOUS

## 2012-05-02 MED ORDER — OXYCODONE-ACETAMINOPHEN 5-325 MG PO TABS
2.0000 | ORAL_TABLET | Freq: Once | ORAL | Status: AC
  Administered 2012-05-02: 2 via ORAL
  Filled 2012-05-02: qty 2

## 2012-05-02 MED ORDER — HYDROMORPHONE HCL PF 2 MG/ML IJ SOLN
2.0000 mg | INTRAMUSCULAR | Status: AC
  Administered 2012-05-02: 2 mg via INTRAMUSCULAR
  Filled 2012-05-02: qty 2

## 2012-05-02 MED ORDER — IOHEXOL 300 MG/ML  SOLN: 20.0000 mL | INTRAMUSCULAR | Status: AC

## 2012-05-02 MED ORDER — HYDROMORPHONE HCL PF 1 MG/ML IJ SOLN
1.0000 mg | Freq: Once | INTRAMUSCULAR | Status: DC
  Filled 2012-05-02: qty 1

## 2012-05-02 NOTE — ED Provider Notes (Signed)
Patient turned knee. Patient with several day history of lumbar back pain really started on Wednesday. Got worse today difficult to ambulate but able to ambulate with support. Patient had no neuro deficits no bowel incontinence. Patient also has a nonhealing wound to his left groin. Patient was turned over to me was still pending CT results CT abdomen pelvis and CT lumbar back. Does came back all negative or no acute changes. Patient's left groin wound from the surface the is a chronic wound that appears to be healing fine CT showed no evidence of abscess below that. Patient did develop a low-grade fever in the emergency partner 100.6 approach to try to find the etiology that no evidence of urinary tract infection white count was up a little bit no evidence of pneumonia on chest x-ray. No recent back surgery or any thank patient's blood sugar was fine but with being kept n.p.o. here during his stay his blood sugar corrected itself fine. Patient's pain finally came under control after not coming under control with the lauded with the 2 pills of Percocet 5 mg per 325 mg. Patient's repeat tabs 99. The patient will go home and followup with primary care doctor on Monday or Tuesday. They will return for any newer worse symptoms or if the sudden spike in temperature.   Results for orders placed during the hospital encounter of 05/02/12  CBC WITH DIFFERENTIAL      Component Value Range   WBC 14.2 (*) 4.0 - 10.5 K/uL   RBC 3.93 (*) 4.22 - 5.81 MIL/uL   Hemoglobin 11.2 (*) 13.0 - 17.0 g/dL   HCT 16.1 (*) 09.6 - 04.5 %   MCV 85.8  78.0 - 100.0 fL   MCH 28.5  26.0 - 34.0 pg   MCHC 33.2  30.0 - 36.0 g/dL   RDW 40.9  81.1 - 91.4 %   Platelets 196  150 - 400 K/uL   Neutrophils Relative 74  43 - 77 %   Neutro Abs 10.6 (*) 1.7 - 7.7 K/uL   Lymphocytes Relative 16  12 - 46 %   Lymphs Abs 2.2  0.7 - 4.0 K/uL   Monocytes Relative 10  3 - 12 %   Monocytes Absolute 1.4 (*) 0.1 - 1.0 K/uL   Eosinophils Relative 0  0  - 5 %   Eosinophils Absolute 0.0  0.0 - 0.7 K/uL   Basophils Relative 0  0 - 1 %   Basophils Absolute 0.0  0.0 - 0.1 K/uL  BASIC METABOLIC PANEL      Component Value Range   Sodium 131 (*) 135 - 145 mEq/L   Potassium 4.3  3.5 - 5.1 mEq/L   Chloride 95 (*) 96 - 112 mEq/L   CO2 22  19 - 32 mEq/L   Glucose, Bld 310 (*) 70 - 99 mg/dL   BUN 17  6 - 23 mg/dL   Creatinine, Ser 7.82  0.50 - 1.35 mg/dL   Calcium 9.4  8.4 - 95.6 mg/dL   GFR calc non Af Amer 60 (*) >90 mL/min   GFR calc Af Amer 69 (*) >90 mL/min  URINALYSIS, ROUTINE W REFLEX MICROSCOPIC      Component Value Range   Color, Urine YELLOW  YELLOW   APPearance CLOUDY (*) CLEAR   Specific Gravity, Urine 1.034 (*) 1.005 - 1.030   pH 5.0  5.0 - 8.0   Glucose, UA >1000 (*) NEGATIVE mg/dL   Hgb urine dipstick NEGATIVE  NEGATIVE  Bilirubin Urine NEGATIVE  NEGATIVE   Ketones, ur NEGATIVE  NEGATIVE mg/dL   Protein, ur NEGATIVE  NEGATIVE mg/dL   Urobilinogen, UA 0.2  0.0 - 1.0 mg/dL   Nitrite NEGATIVE  NEGATIVE   Leukocytes, UA NEGATIVE  NEGATIVE  URINE MICROSCOPIC-ADD ON      Component Value Range   Squamous Epithelial / LPF FEW (*) RARE  GLUCOSE, CAPILLARY      Component Value Range   Glucose-Capillary 160 (*) 70 - 99 mg/dL  GLUCOSE, CAPILLARY      Component Value Range   Glucose-Capillary 103 (*) 70 - 99 mg/dL  GLUCOSE, CAPILLARY      Component Value Range   Glucose-Capillary 96  70 - 99 mg/dL  GLUCOSE, CAPILLARY      Component Value Range   Glucose-Capillary 74  70 - 99 mg/dL   Results for orders placed during the hospital encounter of 05/02/12  CBC WITH DIFFERENTIAL      Component Value Range   WBC 14.2 (*) 4.0 - 10.5 K/uL   RBC 3.93 (*) 4.22 - 5.81 MIL/uL   Hemoglobin 11.2 (*) 13.0 - 17.0 g/dL   HCT 16.1 (*) 09.6 - 04.5 %   MCV 85.8  78.0 - 100.0 fL   MCH 28.5  26.0 - 34.0 pg   MCHC 33.2  30.0 - 36.0 g/dL   RDW 40.9  81.1 - 91.4 %   Platelets 196  150 - 400 K/uL   Neutrophils Relative 74  43 - 77 %   Neutro  Abs 10.6 (*) 1.7 - 7.7 K/uL   Lymphocytes Relative 16  12 - 46 %   Lymphs Abs 2.2  0.7 - 4.0 K/uL   Monocytes Relative 10  3 - 12 %   Monocytes Absolute 1.4 (*) 0.1 - 1.0 K/uL   Eosinophils Relative 0  0 - 5 %   Eosinophils Absolute 0.0  0.0 - 0.7 K/uL   Basophils Relative 0  0 - 1 %   Basophils Absolute 0.0  0.0 - 0.1 K/uL  BASIC METABOLIC PANEL      Component Value Range   Sodium 131 (*) 135 - 145 mEq/L   Potassium 4.3  3.5 - 5.1 mEq/L   Chloride 95 (*) 96 - 112 mEq/L   CO2 22  19 - 32 mEq/L   Glucose, Bld 310 (*) 70 - 99 mg/dL   BUN 17  6 - 23 mg/dL   Creatinine, Ser 7.82  0.50 - 1.35 mg/dL   Calcium 9.4  8.4 - 95.6 mg/dL   GFR calc non Af Amer 60 (*) >90 mL/min   GFR calc Af Amer 69 (*) >90 mL/min  URINALYSIS, ROUTINE W REFLEX MICROSCOPIC      Component Value Range   Color, Urine YELLOW  YELLOW   APPearance CLOUDY (*) CLEAR   Specific Gravity, Urine 1.034 (*) 1.005 - 1.030   pH 5.0  5.0 - 8.0   Glucose, UA >1000 (*) NEGATIVE mg/dL   Hgb urine dipstick NEGATIVE  NEGATIVE   Bilirubin Urine NEGATIVE  NEGATIVE   Ketones, ur NEGATIVE  NEGATIVE mg/dL   Protein, ur NEGATIVE  NEGATIVE mg/dL   Urobilinogen, UA 0.2  0.0 - 1.0 mg/dL   Nitrite NEGATIVE  NEGATIVE   Leukocytes, UA NEGATIVE  NEGATIVE  URINE MICROSCOPIC-ADD ON      Component Value Range   Squamous Epithelial / LPF FEW (*) RARE  GLUCOSE, CAPILLARY      Component Value Range   Glucose-Capillary  160 (*) 70 - 99 mg/dL  GLUCOSE, CAPILLARY      Component Value Range   Glucose-Capillary 103 (*) 70 - 99 mg/dL  GLUCOSE, CAPILLARY      Component Value Range   Glucose-Capillary 96  70 - 99 mg/dL  GLUCOSE, CAPILLARY      Component Value Range   Glucose-Capillary 74  70 - 99 mg/dL   Ct Abdomen Pelvis Wo Contrast  05/02/2012  *RADIOLOGY REPORT*  Clinical Data: Severe abdominal and back pain.  Previous abdominal aortic aneurysm repair.  CT ABDOMEN AND PELVIS WITHOUT CONTRAST  Technique:  Multidetector CT imaging of the  abdomen and pelvis was performed following the standard protocol without intravenous contrast.  Comparison: 03/20/2012  Findings: Stable appearance of the aortobifemoral bypass graft noted.  To fem-fem bypass grafts are again demonstrated. No evidence of retroperitoneal hemorrhage or mass.  Mildly enlarged prostate gland is stable. No lymphadenopathy seen within the pelvis or abdomen.  The abdominal parenchymal organs have a normal appearance on this noncontrast study.  No evidence of hydronephrosis.  Gallbladder is unremarkable.  No soft tissue masses identified.  Diverticulosis is again noted, however there is no evidence of diverticulitis.  Other inflammatory process or abnormal fluid collections are identified.  IMPRESSION:  1.  No acute findings. 2.  Diverticulosis.  No radiographic evidence of diverticulitis.   Original Report Authenticated By: Myles Rosenthal, M.D.    Dg Chest 1 View  05/02/2012  *RADIOLOGY REPORT*  Clinical Data: Severe back pain, diabetes, hypertension, coronary artery disease, former smoker  CHEST - 1 VIEW  Comparison: 12/20/2011  Findings: Mild elevation of right diaphragm. Upper normal heart size. Slight pulmonary vascular congestion. Mediastinal contours normal. Lungs grossly clear. No pleural effusion or pneumothorax.  IMPRESSION: No definite acute abnormalities. Mild elevation of right diaphragm.   Original Report Authenticated By: Ulyses Southward, M.D.    Dg Lumbar Spine Complete  05/02/2012  *RADIOLOGY REPORT*  Clinical Data: Back pain  LUMBAR SPINE - COMPLETE 4+ VIEW  Comparison: CT 03/20/2012  Findings: Normal alignment of the lumbar vertebral bodies.  There is bulky endplate osteophytosis from L3-L5.  There is disc space narrowing at L5-S1.  There is facet hypertrophy at L4 and L5. Normal alignment.  No subluxation.  IMPRESSION: Multilevel disc osteophytic disease not changed from comparison CTN   Original Report Authenticated By: Genevive Bi, M.D.    Ct Lumbar Spine Wo  Contrast  05/02/2012  *RADIOLOGY REPORT*  Clinical Data: The patient began to experience severe back pain on Wednesday.  Unable to stand.  Right hip pain.  CT LUMBAR SPINE WITHOUT CONTRAST  Technique:  Multidetector CT imaging of the lumbar spine was performed without intravenous contrast administration. Multiplanar CT image reconstructions were also generated.  Comparison: CT abdomen and pelvis 03/20/2012.  Findings: Anatomic alignment.  No compression fracture or traumatic subluxation.  Congenitally short pedicles.  Exuberant osteophyte formation anteriorly.  Calcification or ossification of the posterior longitudinal ligament. No paravertebral masses.  The individual disc spaces are examined as follows:  L1-2:    No disc protrusion or spinal stenosis.  Mild facet arthropathy.  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  No disc protrusion.  Mild facet arthropathy.  No neural compression.  L3-4:  No disc protrusion.  Borderline spinal stenosis.  Moderate facet and ligamentum flavum hypertrophy.  L4-5:  Mild annular bulging.  Moderate facet and ligamentum flavum hypertrophy.  Borderline canal stenosis without L5 nerve root encroachment.  No significant  foraminal narrowing.  L5-S1:  Calcified central protrusion.  Moderate facet arthropathy. Dorsal displacement both S1 nerve roots.  No significant foraminal narrowing.  Compared with prior CT abdomen using multiplanar reformatted images from November, a similar appearance to the lumbar spine is noted.  IMPRESSION: Multilevel lumbosacral spondylosis as described.  Symptomatic neural compression most likely to occur at L5-S1 where there is a calcified central protrusion.  This appearance however is stable compared with November.   Original Report Authenticated By: Davonna Belling, M.D.       Shelda Jakes, MD 05/02/12 2252

## 2012-05-02 NOTE — ED Notes (Signed)
Pt c/o low back pain onset upon waking up Wednesday. Pt denies injury. Pt unable to ambulate due to pain. Pt denies urinary or bowel incontinence.

## 2012-05-02 NOTE — ED Provider Notes (Signed)
History     CSN: [id number]  Arrival date & time 05/02/12  0930   First MD Initiated Contact with Patient 05/02/12 1000      Chief Complaint  Patient presents with  . Back Pain    (Consider location/radiation/quality/duration/timing/severity/associated sxs/prior treatment) HPI Logan [name] is a 66 y.o. male with a history of peripheral vascular disease, coronary artery disease with a stent in 2004, he's had aortobifemoral bypass with occlusion of the right limb, and subsequent left right fem-fem bypass. He did require thrombectomy by Dr. Imogene Burn in August with fasciotomies. He has had a persistent draining left sided inguinal wound with prior incision and drainage. Patient says he was seen by vascular surgery last week, the left-sided inguinal wound has not changed since then. Patient stopped taking his ciprofloxacin on Wednesday because it was gone. Patient presents today because on Wednesday he awoke with right-sided back pain. He says he has not fallen on it denies any antecedent injury. He's got no history of chronic back pain. He denies any weakness in the lower extremities but says it's been very difficult to walk and has been using a cane.  Patient denies any bowel or bladder incontinence, denies any melena or bloody stools, no abdominal pain. He also denies any chest pain, shortness of breath, diaphoresis, fevers or chills. No increased drainage at left wound site.  Past Medical History  Diagnosis Date  . Overweight   . Dyslipidemia   . Cardiomyopathy   . Diabetes mellitus   . PVD (peripheral vascular disease)     right to left fem -fem bypass 1997/ aortabifemoral bypass 2005 right to left fem-fem 07/2004. Secondary to occluded righ tlimb of aortobifemoral bypass  . Tobacco abuse   . Anginal pain   . Hypertension   . Shortness of breath   . Coronary artery disease     taxis DES stent circumflex 02/2003.... residual total distal circumflex and 50% LAD/ cardiolite 08/2006 inferior  scar no ischemia    Past Surgical History  Procedure Date  . Knee surgery   . Axillary-femoral bypass graft 12/20/2011    Procedure: BYPASS GRAFT AXILLA-BIFEMORAL;  Surgeon: Fransisco Hertz, MD;  Location: [facility] OR;  Service: Vascular;  Laterality: Bilateral;  Left aorta bilateral femoral thrombectomy, femoral to femoral thrombectomy, and revision of Femoral to femoral graft. and Thrombectomy of Left tibial artery.  . Fasciotomy 12/20/2011    Procedure: FASCIOTOMY;  Surgeon: Fransisco Hertz, MD;  Location: [facility] OR;  Service: Vascular;  Laterality: Right;  right calf facsicotomy.  . Lower extremity angiogram 12/20/2011    Procedure: LOWER EXTREMITY ANGIOGRAM;  Surgeon: Fransisco Hertz, MD;  Location: [facility] OR;  Service: Vascular;  Laterality: Bilateral;  Intraoperative Abdominal Aortogram with bilateral runoff  . Left-to-right femoral-femoral bypass graft 07-30-2004  DR LAWSON    OCCLUSION RIGHT LIMB AORTOBIFEMORAL BYPASS GRAFT  W/ SEVERE CLAUDICATION  . Aorto to right common femoral and left profunda femoris bypass graft 09-28-2003 DR LAWSON    SEVERE AORTOILIAC OCCLUSIVE DISEASE W/ OCCLUDED FEM-FEM BYPASS OF BOTH LOWER EXTREMITIES  . Right lateral parotidectomy with facial verve preservation 02-25-2001  DR [facility]    NEOPLASM RIGHT PAROTID GLAND  . Coronary angioplasty with stent placement 02-23-2003  DR [facility]    PCI W/ STENTING PROXIMAL CIRCUMFLEX  . Femoral-femoral bypass graft 1997  DR LAWSON    RIGHT TO LEFT  . Debridement leg 12/25/11  . Incision and drainage of wound 02/17/2012    Procedure: IRRIGATION AND DEBRIDEMENT WOUND;  Surgeon: Wayland Denis, DO;  Location: [facility];  Service: Plastics;  Laterality: Bilateral;    Family History  Problem Relation Age of Onset  . Hypertension Mother     History  Substance Use Topics  . Smoking status: Former Smoker -- 40 years    Types: Cigarettes    Quit date: 12/20/2011  . Smokeless tobacco: Never Used  . Alcohol Use: Yes     Comment:  liquior sometimes      Review of Systems At least 10pt or greater review of systems completed and are negative except where specified in the HPI.  Allergies  Aspirin and Penicillins  Home Medications   Current Outpatient Rx  Name  Route  Sig  Dispense  Refill  . AMLODIPINE BESYLATE 2.5 MG PO TABS   Oral   Take 2.5 mg by mouth daily.         . ASPIRIN EC 81 MG PO TBEC   Oral   Take 81 mg by mouth daily.         Marland Kitchen BAG BALM OINTMENT   Topical   Apply topically daily at 3 pm. Send jar home with pt         . CLOPIDOGREL BISULFATE 75 MG PO TABS   Oral   Take 1 tablet (75 mg total) by mouth daily with breakfast.         . GLIMEPIRIDE 4 MG PO TABS   Oral   Take 4 mg by mouth 2 (two) times daily.         . INSULIN ASPART 100 UNIT/ML [place] SOLN   Subcutaneous   Inject 10 Units into the skin daily before supper.         . INSULIN GLARGINE 100 UNIT/ML [place] SOLN   Subcutaneous   Inject 50-60 Units into the skin 2 (two) times daily. 50 units in the morning and 60 units in the evening         . LISINOPRIL 40 MG PO TABS   Oral   Take 40 mg by mouth daily.         Marland Kitchen METFORMIN HCL 500 MG PO TABS   Oral   Take 1,000 mg by mouth 2 (two) times daily with a meal.         . METOPROLOL TARTRATE 50 MG PO TABS   Oral   Take 50 mg by mouth 2 (two) times daily.         . MULTIVITAMIN PO   Oral   Take 1 tablet by mouth daily.          Marland Kitchen FISH OIL 1000 MG PO CAPS   Oral   Take 1 capsule by mouth 2 (two) times daily.          Marland Kitchen OMEPRAZOLE 20 MG PO CPDR   Oral   Take 20 mg by mouth daily.         . OXYCODONE-ACETAMINOPHEN 5-325 MG PO TABS   Oral   Take 1 tablet by mouth every 4 (four) hours as needed. For pain   20 tablet   0   . SIMVASTATIN 20 MG PO TABS   Oral   Take 20 mg by mouth every evening.           BP 135/70  Pulse 104  Temp 98.8 F (37.1 C) (Oral)  Resp 20  SpO2 99%  Physical Exam  Nursing notes reviewed.  Electronic medical  record reviewed. VITAL SIGNS:   Ceasar Mons  Vitals:   05/02/12 0938 05/02/12 1503 05/02/12 1530  BP: 135/70 153/80 145/91  Pulse: 104 100 112  Temp: 98.8 F (37.1 C) 97.8 F (36.6 C)   TempSrc: Oral Oral   Resp: 20 18 18   SpO2: 99% 96% 96%   CONSTITUTIONAL: Awake, oriented, appears non-toxic HENT: Atraumatic, normocephalic, oral mucosa pink and moist, airway patent. Nares patent without drainage. External ears normal. EYES: Conjunctiva clear, EOMI, PERRLA NECK: Trachea midline, non-tender, supple CARDIOVASCULAR: Tachycardic, Normal rhythm PULMONARY/CHEST: Clear to auscultation, no rhonchi, wheezes, or rales. Symmetrical breath sounds. Non-tender. ABDOMINAL: Non-distended, soft, non-tender - no rebound or guarding.  BS normal. Back: Tenderness to palpation in the right paraspinous region that extends to the right flank NEUROLOGIC: Non-focal, moving all four extremities, no gross sensory or motor deficits. Reflexes are 1+ and lower extremities without clonus, sensation is normal and lower extremities. EXTREMITIES: No clubbing, cyanosis, or edema SKIN: Warm, Dry, No erythema, No rash  ED Course  Procedures (including critical care time)  Labs Reviewed  CBC WITH DIFFERENTIAL - Abnormal; Notable for the following:    WBC 14.2 (*)     RBC 3.93 (*)     Hemoglobin 11.2 (*)     HCT 33.7 (*)     Neutro Abs 10.6 (*)     Monocytes Absolute 1.4 (*)     All other components within normal limits  BASIC METABOLIC PANEL - Abnormal; Notable for the following:    Sodium 131 (*)     Chloride 95 (*)     Glucose, Bld 310 (*)     GFR calc non Af Amer 60 (*)     GFR calc Af Amer 69 (*)     All other components within normal limits  URINALYSIS, ROUTINE W REFLEX MICROSCOPIC - Abnormal; Notable for the following:    APPearance CLOUDY (*)     Specific Gravity, Urine 1.034 (*)     Glucose, UA >1000 (*)     All other components within normal limits  URINE MICROSCOPIC-ADD ON - Abnormal; Notable for the  following:    Squamous Epithelial / LPF FEW (*)     All other components within normal limits  GLUCOSE, CAPILLARY - Abnormal; Notable for the following:    Glucose-Capillary 160 (*)     All other components within normal limits   Dg Lumbar Spine Complete  05/02/2012  *RADIOLOGY REPORT*  Clinical Data: Back pain  LUMBAR SPINE - COMPLETE 4+ VIEW  Comparison: CT 03/20/2012  Findings: Normal alignment of the lumbar vertebral bodies.  There is bulky endplate osteophytosis from L3-L5.  There is disc space narrowing at L5-S1.  There is facet hypertrophy at L4 and L5. Normal alignment.  No subluxation.  IMPRESSION: Multilevel disc osteophytic disease not changed from comparison CTN   Original Report Authenticated By: Genevive Bi, M.D.      1. Back pain   2. Non-healing surgical wound       MDM  Patient presents with atypical back pain- not had chronic back pain in the past but he has had multiple directions for his peripheral vascular disease and has grafts. Patient treated aggressively with pain medicine and did not resolve. Labs obtained showed a mild increase in his white count at 14,000. Glucose is also slightly elevated he says she's been struggling trying to get it under control. Given some more pain medicine and imaged his back which did not show any acute abnormalities-my suspicion is starting to arise for a graft problem, will obtain  CT of the lumbar spine as well as a CT of the abdomen and pelvis.  Patient's labs do show some mild dehydration, he's been getting some fluid. Patient is afebrile and has been persistently slightly tachycardic from 104 112 - addressed with fluids, pain medicine.  Care of patient transferred to Dr. Vashti Hey, MD 05/02/12 1714

## 2012-05-02 NOTE — ED Notes (Signed)
Pt dc to home with family.  Pt verbalizes understanding to dc paperwork and will f/u with pcp as directed.

## 2012-05-26 ENCOUNTER — Inpatient Hospital Stay (HOSPITAL_COMMUNITY): Payer: Medicare Other

## 2012-05-26 ENCOUNTER — Other Ambulatory Visit: Payer: Self-pay | Admitting: Surgical

## 2012-05-26 ENCOUNTER — Encounter (HOSPITAL_COMMUNITY): Payer: Self-pay

## 2012-05-26 ENCOUNTER — Inpatient Hospital Stay (HOSPITAL_COMMUNITY)
Admission: AD | Admit: 2012-05-26 | Discharge: 2012-06-06 | DRG: 029 | Disposition: A | Discharge status: Skilled Nursing Facility | Payer: Medicare Other | Source: Ambulatory Visit | Attending: Orthopedic Surgery | Admitting: Orthopedic Surgery

## 2012-05-26 DIAGNOSIS — I428 Other cardiomyopathies: Secondary | ICD-10-CM | POA: Diagnosis present

## 2012-05-26 DIAGNOSIS — Z9119 Patient's noncompliance with other medical treatment and regimen: Secondary | ICD-10-CM

## 2012-05-26 DIAGNOSIS — Z91148 Patient's other noncompliance with medication regimen for other reason: Secondary | ICD-10-CM

## 2012-05-26 DIAGNOSIS — E162 Hypoglycemia, unspecified: Secondary | ICD-10-CM

## 2012-05-26 DIAGNOSIS — Z880 Allergy status to penicillin: Secondary | ICD-10-CM

## 2012-05-26 DIAGNOSIS — Z794 Long term (current) use of insulin: Secondary | ICD-10-CM

## 2012-05-26 DIAGNOSIS — G061 Intraspinal abscess and granuloma: Principal | ICD-10-CM

## 2012-05-26 DIAGNOSIS — E663 Overweight: Secondary | ICD-10-CM | POA: Diagnosis present

## 2012-05-26 DIAGNOSIS — F17200 Nicotine dependence, unspecified, uncomplicated: Secondary | ICD-10-CM

## 2012-05-26 DIAGNOSIS — Z9861 Coronary angioplasty status: Secondary | ICD-10-CM

## 2012-05-26 DIAGNOSIS — M869 Osteomyelitis, unspecified: Secondary | ICD-10-CM | POA: Diagnosis present

## 2012-05-26 DIAGNOSIS — E785 Hyperlipidemia, unspecified: Secondary | ICD-10-CM

## 2012-05-26 DIAGNOSIS — M4807 Spinal stenosis, lumbosacral region: Secondary | ICD-10-CM

## 2012-05-26 DIAGNOSIS — Z79899 Other long term (current) drug therapy: Secondary | ICD-10-CM

## 2012-05-26 DIAGNOSIS — R509 Fever, unspecified: Secondary | ICD-10-CM | POA: Diagnosis not present

## 2012-05-26 DIAGNOSIS — E119 Type 2 diabetes mellitus without complications: Secondary | ICD-10-CM

## 2012-05-26 DIAGNOSIS — K5900 Constipation, unspecified: Secondary | ICD-10-CM | POA: Diagnosis not present

## 2012-05-26 DIAGNOSIS — E441 Mild protein-calorie malnutrition: Secondary | ICD-10-CM | POA: Diagnosis present

## 2012-05-26 DIAGNOSIS — I739 Peripheral vascular disease, unspecified: Secondary | ICD-10-CM

## 2012-05-26 DIAGNOSIS — E1169 Type 2 diabetes mellitus with other specified complication: Secondary | ICD-10-CM | POA: Diagnosis present

## 2012-05-26 DIAGNOSIS — Z91199 Patient's noncompliance with other medical treatment and regimen due to unspecified reason: Secondary | ICD-10-CM

## 2012-05-26 DIAGNOSIS — Z9114 Patient's other noncompliance with medication regimen: Secondary | ICD-10-CM

## 2012-05-26 DIAGNOSIS — M4627 Osteomyelitis of vertebra, lumbosacral region: Secondary | ICD-10-CM

## 2012-05-26 DIAGNOSIS — I10 Essential (primary) hypertension: Secondary | ICD-10-CM

## 2012-05-26 DIAGNOSIS — Z6834 Body mass index (BMI) 34.0-34.9, adult: Secondary | ICD-10-CM

## 2012-05-26 DIAGNOSIS — I2510 Atherosclerotic heart disease of native coronary artery without angina pectoris: Secondary | ICD-10-CM

## 2012-05-26 DIAGNOSIS — M48061 Spinal stenosis, lumbar region without neurogenic claudication: Secondary | ICD-10-CM | POA: Diagnosis present

## 2012-05-26 DIAGNOSIS — I70219 Atherosclerosis of native arteries of extremities with intermittent claudication, unspecified extremity: Secondary | ICD-10-CM

## 2012-05-26 LAB — CBC WITH DIFFERENTIAL/PLATELET
Basophils Absolute: 0.1 10*3/uL (ref 0.0–0.1)
Basophils Relative: 0 % (ref 0–1)
Eosinophils Absolute: 0 10*3/uL (ref 0.0–0.7)
Eosinophils Relative: 0 % (ref 0–5)
HCT: 33.4 % — ABNORMAL LOW (ref 39.0–52.0)
Hemoglobin: 11 g/dL — ABNORMAL LOW (ref 13.0–17.0)
Lymphocytes Relative: 23 % (ref 12–46)
Lymphs Abs: 3.5 10*3/uL (ref 0.7–4.0)
MCH: 27.1 pg (ref 26.0–34.0)
MCHC: 32.9 g/dL (ref 30.0–36.0)
MCV: 82.3 fL (ref 78.0–100.0)
Monocytes Absolute: 1.3 10*3/uL — ABNORMAL HIGH (ref 0.1–1.0)
Monocytes Relative: 9 % (ref 3–12)
Neutro Abs: 10.4 10*3/uL — ABNORMAL HIGH (ref 1.7–7.7)
Neutrophils Relative %: 68 % (ref 43–77)
Platelets: 282 10*3/uL (ref 150–400)
RBC: 4.06 MIL/uL — ABNORMAL LOW (ref 4.22–5.81)
RDW: 13.3 % (ref 11.5–15.5)
WBC: 15.3 10*3/uL — ABNORMAL HIGH (ref 4.0–10.5)

## 2012-05-26 LAB — PROTIME-INR: Prothrombin Time: 15.2 seconds (ref 11.6–15.2)

## 2012-05-26 LAB — BASIC METABOLIC PANEL
GFR calc non Af Amer: 67 mL/min — ABNORMAL LOW (ref >90)
Glucose, Bld: 298 mg/dL — ABNORMAL HIGH (ref 70–99)
Potassium: 4.5 mEq/L (ref 3.5–5.1)
Sodium: 130 mEq/L — ABNORMAL LOW (ref 135–145)

## 2012-05-26 LAB — GLUCOSE, CAPILLARY: Glucose-Capillary: 261 mg/dL — ABNORMAL HIGH (ref 70–99)

## 2012-05-26 MED ORDER — OXYCODONE HCL 5 MG PO TABS
5.0000 mg | ORAL_TABLET | ORAL | Status: DC | PRN
  Administered 2012-05-26 – 2012-06-03 (×34): 10 mg via ORAL
  Administered 2012-06-03: 5 mg via ORAL
  Filled 2012-05-26 (×27): qty 2
  Filled 2012-05-26 (×2): qty 1
  Filled 2012-05-26 (×7): qty 2

## 2012-05-26 MED ORDER — BISACODYL 10 MG RE SUPP: 10.0000 mg | Freq: Every day | RECTAL | Status: DC | PRN

## 2012-05-26 MED ORDER — SODIUM CHLORIDE 0.9 % IJ SOLN: 3.0000 mL | INTRAMUSCULAR | Status: DC | PRN

## 2012-05-26 MED ORDER — ACETAMINOPHEN 650 MG RE SUPP: 650.0000 mg | Freq: Four times a day (QID) | RECTAL | Status: DC | PRN

## 2012-05-26 MED ORDER — FLEET ENEMA 7-19 GM/118ML RE ENEM: 1.0000 | ENEMA | Freq: Once | RECTAL | Status: AC | PRN

## 2012-05-26 MED ORDER — ONDANSETRON HCL 4 MG/2ML IJ SOLN: 4.0000 mg | Freq: Four times a day (QID) | INTRAMUSCULAR | Status: DC | PRN

## 2012-05-26 MED ORDER — ONDANSETRON HCL 4 MG PO TABS: 4.0000 mg | ORAL_TABLET | Freq: Four times a day (QID) | ORAL | Status: DC | PRN

## 2012-05-26 MED ORDER — ACETAMINOPHEN 325 MG PO TABS
650.0000 mg | ORAL_TABLET | Freq: Four times a day (QID) | ORAL | Status: DC | PRN
  Administered 2012-06-01: 325 mg via ORAL
  Filled 2012-05-26: qty 1

## 2012-05-26 MED ORDER — LACTATED RINGERS IV SOLN
INTRAVENOUS | Status: DC
  Administered 2012-06-02: 1000 mL via INTRAVENOUS

## 2012-05-26 MED ORDER — HYDROMORPHONE HCL PF 1 MG/ML IJ SOLN
1.0000 mg | INTRAMUSCULAR | Status: DC | PRN
  Administered 2012-06-02 – 2012-06-05 (×7): 1 mg via INTRAVENOUS
  Filled 2012-05-26 (×7): qty 1

## 2012-05-26 MED ORDER — VANCOMYCIN HCL 10 G IV SOLR
2000.0000 mg | Freq: Once | INTRAVENOUS | Status: AC
  Administered 2012-05-26: 2000 mg via INTRAVENOUS
  Filled 2012-05-26: qty 2000

## 2012-05-26 MED ORDER — SODIUM CHLORIDE 0.9 % IV SOLN
INTRAVENOUS | Status: DC
  Administered 2012-05-26 – 2012-06-02 (×6): via INTRAVENOUS

## 2012-05-26 MED ORDER — SODIUM CHLORIDE 0.9 % IJ SOLN
3.0000 mL | Freq: Two times a day (BID) | INTRAMUSCULAR | Status: DC
  Administered 2012-06-03 – 2012-06-06 (×3): 3 mL via INTRAVENOUS

## 2012-05-26 MED ORDER — SODIUM CHLORIDE 0.9 % IV SOLN: 250.0000 mL | INTRAVENOUS | Status: DC | PRN

## 2012-05-26 MED ORDER — POLYETHYLENE GLYCOL 3350 17 G PO PACK: 17.0000 g | PACK | Freq: Every day | ORAL | Status: DC | PRN

## 2012-05-26 MED ORDER — VANCOMYCIN HCL IN DEXTROSE 1-5 GM/200ML-% IV SOLN
1000.0000 mg | Freq: Two times a day (BID) | INTRAVENOUS | Status: DC
  Administered 2012-05-27 – 2012-06-06 (×21): 1000 mg via INTRAVENOUS
  Filled 2012-05-26 (×23): qty 200

## 2012-05-26 NOTE — Progress Notes (Addendum)
ANTIBIOTIC CONSULT NOTE - INITIAL  Pharmacy Consult for Vancomycin Indication: Lumbar disc space infection L5-S1  Allergies  Allergen Reactions  . Aspirin Hives  . Penicillins Rash and Other (See Comments)    Immune system shut down     Patient Measurements:   Adjusted Body Weight:  Vital Signs: Temp: 98.3 F (36.8 C) (01/07 1040) Temp src: Oral (01/07 1040) BP: 121/72 mmHg (01/07 1040) Pulse Rate: 114  (01/07 1040) Intake/Output from previous day:   Intake/Output from this shift:    Labs: No results found for this basename: WBC:3,HGB:3,PLT:3,LABCREA:3,CREATININE:3 in the last 72 hours The CrCl is unknown because both a height and weight (above a minimum accepted value) are required for this calculation. No results found for this basename: VANCOTROUGH:2,VANCOPEAK:2,VANCORANDOM:2,GENTTROUGH:2,GENTPEAK:2,GENTRANDOM:2,TOBRATROUGH:2,TOBRAPEAK:2,TOBRARND:2,AMIKACINPEAK:2,AMIKACINTROU:2,AMIKACIN:2, in the last 72 hours   Medical History: Past Medical History  Diagnosis Date  . Overweight   . Dyslipidemia   . Cardiomyopathy   . Diabetes mellitus   . PVD (peripheral vascular disease)     right to left fem -fem bypass 1997/ aortabifemoral bypass 2005 right to left fem-fem 07/2004. Secondary to occluded righ tlimb of aortobifemoral bypass  . Tobacco abuse   . Anginal pain   . Hypertension   . Shortness of breath   . Coronary artery disease     taxis DES stent circumflex 02/2003.... residual total distal circumflex and 50% LAD/ cardiolite 08/2006 inferior scar no ischemia  . Degenerative disc disease, lumbar     Assessment:  42 yom with h/o several vascular stents and grafts with 3 weeks of back pain. MRI showed lumbar disc herniation at L5-S1 with an epidural abscess.  Pt was placed on Cipro PTA then admit 1/7 for IV antibiotics and will eventually need lumbar microdiscectomy/decompression at L5-S1 (once completely off Plavix effect).  MD would like to start IV  Vancomycin  Pt is afebrile, CBC pending  Last reported weight = 104.8 kg.  BMET pending  Goal of Therapy:  Vancomycin trough level 15-20 mcg/ml  Plan:   Vancomycin 2gm IV x 1 as loading dose  Will await BMET for Scr and continue with maintenance vancomycin dose.  Pharmacy will f/u  Geoffry Paradise, PharmD, BCPS Pager: [phone] 10:57 AM Pharmacy #: [phone]    Addendum:  Scr returns as 1.1, CG CrCl 76 ml/min, normalized CrCl = 65.  Plan: Vancomycin 1gm IV q12h.  Will f/u.  Geoffry Paradise, PharmD, BCPS Pager: [phone] 2:56 PM Pharmacy #: [phone]

## 2012-05-26 NOTE — H&P (Signed)
Logan [name] is an 67 y.o. male.   Chief Complaint: back pain HPI: The patient is a 67 year old male who presented with the chief complaint of back pain. He has a history of several vascular stents and grafts, most recent femoral in the fall of 2012 by Dr. Hart Rochester. He presents with back pain 3 weeks ago after no known injury. He presented today following MRI which showed lumbar disc herniation at L5-S1 with an epidural abscess at the same level. He has been on Cipro by mouth before admission today.    Past Medical History  Diagnosis Date  . Overweight   . Dyslipidemia   . Cardiomyopathy   . Diabetes mellitus   . PVD (peripheral vascular disease)     right to left fem -fem bypass 1997/ aortabifemoral bypass 2005 right to left fem-fem 07/2004. Secondary to occluded righ tlimb of aortobifemoral bypass  . Tobacco abuse   . Anginal pain   . Hypertension   . Shortness of breath   . Coronary artery disease     taxis DES stent circumflex 02/2003.... residual total distal circumflex and 50% LAD/ cardiolite 08/2006 inferior scar no ischemia  . Degenerative disc disease, lumbar     Past Surgical History  Procedure Date  . Knee surgery   . Axillary-femoral bypass graft 12/20/2011    Procedure: BYPASS GRAFT AXILLA-BIFEMORAL;  Surgeon: Fransisco Hertz, MD;  Location: [facility] OR;  Service: Vascular;  Laterality: Bilateral;  Left aorta bilateral femoral thrombectomy, femoral to femoral thrombectomy, and revision of Femoral to femoral graft. and Thrombectomy of Left tibial artery.  . Fasciotomy 12/20/2011    Procedure: FASCIOTOMY;  Surgeon: Fransisco Hertz, MD;  Location: [facility] OR;  Service: Vascular;  Laterality: Right;  right calf facsicotomy.  . Lower extremity angiogram 12/20/2011    Procedure: LOWER EXTREMITY ANGIOGRAM;  Surgeon: Fransisco Hertz, MD;  Location: [facility] OR;  Service: Vascular;  Laterality: Bilateral;  Intraoperative Abdominal Aortogram with bilateral runoff  . Left-to-right femoral-femoral bypass graft 07-30-2004   DR LAWSON    OCCLUSION RIGHT LIMB AORTOBIFEMORAL BYPASS GRAFT  W/ SEVERE CLAUDICATION  . Aorto to right common femoral and left profunda femoris bypass graft 09-28-2003 DR LAWSON    SEVERE AORTOILIAC OCCLUSIVE DISEASE W/ OCCLUDED FEM-FEM BYPASS OF BOTH LOWER EXTREMITIES  . Right lateral parotidectomy with facial verve preservation 02-25-2001  DR [facility]    NEOPLASM RIGHT PAROTID GLAND  . Coronary angioplasty with stent placement 02-23-2003  DR [facility]    PCI W/ STENTING PROXIMAL CIRCUMFLEX  . Femoral-femoral bypass graft 1997  DR LAWSON    RIGHT TO LEFT  . Debridement leg 12/25/11  . Incision and drainage of wound 02/17/2012    Procedure: IRRIGATION AND DEBRIDEMENT WOUND;  Surgeon: Wayland Denis, DO;  Location: [facility];  Service: Plastics;  Laterality: Bilateral;    Family History  Problem Relation Age of Onset  . Hypertension Mother    Social History:  reports that he quit smoking about 5 months ago. His smoking use included Cigarettes. He quit after 40 years of use. He has never used smokeless tobacco. He reports that he drinks alcohol. He reports that he does not use illicit drugs.  Allergies:  Allergies  Allergen Reactions  . Aspirin Hives  . Penicillins Rash and Other (See Comments)    Immune system shut down      Review of Systems  Constitutional: Positive for malaise/fatigue and diaphoresis. Negative for fever, chills and weight loss.  HENT: Negative.  Negative for neck pain.   Eyes: Negative.   Respiratory: Negative.   Cardiovascular: Negative.   Gastrointestinal: Negative.   Genitourinary: Negative.   Musculoskeletal: Positive for back pain. Negative for myalgias, joint pain and falls.  Skin: Negative.   Neurological: Negative.  Negative for weakness.  Endo/Heme/Allergies: Negative.   Psychiatric/Behavioral: Negative.    Vitals HR: 104 bpm RR: 18 Temp: 98 F BP: 132/86  Physical Exam  Constitutional: He is oriented to person, place, and  time. He appears well-developed and well-nourished. No distress.  HENT:  Head: Normocephalic and atraumatic.  Right Ear: External ear normal.  Left Ear: External ear normal.  Nose: Nose normal.  Mouth/Throat: Oropharynx is clear and moist.  Eyes: Conjunctivae normal and EOM are normal.  Neck: Normal range of motion. Neck supple. No tracheal deviation present. No thyromegaly present.  Cardiovascular: Regular rhythm, normal heart sounds and intact distal pulses.  Tachycardia present.   No murmur heard. Respiratory: Effort normal and breath sounds normal. No respiratory distress. He has no wheezes. He exhibits no tenderness.  GI: Soft. Bowel sounds are normal. He exhibits no distension and no mass. There is no tenderness.  Musculoskeletal:       Right hip: Normal.       Lumbar back: He exhibits decreased range of motion, pain and spasm.       Legs: Lymphadenopathy:    He has no cervical adenopathy.  Neurological: He is alert and oriented to person, place, and time. He has normal strength and normal reflexes. No sensory deficit.  Skin: He is diaphoretic.     Psychiatric: He has a normal mood and affect. His behavior is normal.     Assessment/Plan He needs to be admitted for IV antibiotic coverage and will be scheduled for a lumbar microdiscectomy/decompression at L5-S1. He is on Plavix and therefore will most likely not be ready for surgery until the end of the week or early next week. Dr. Hart Rochester was informed of the patient's status and did not recommend need for anticoagulate bridging. We will place him on vancomycin IV per pharmacy.   Adeli Frost [name] 05/26/2012, 10:13 AM

## 2012-05-27 DIAGNOSIS — E119 Type 2 diabetes mellitus without complications: Secondary | ICD-10-CM

## 2012-05-27 DIAGNOSIS — M869 Osteomyelitis, unspecified: Secondary | ICD-10-CM

## 2012-05-27 DIAGNOSIS — M4627 Osteomyelitis of vertebra, lumbosacral region: Secondary | ICD-10-CM | POA: Diagnosis present

## 2012-05-27 LAB — COMPREHENSIVE METABOLIC PANEL
ALT: 16 U/L (ref 0–53)
AST: 13 U/L (ref 0–37)
Albumin: 2.3 g/dL — ABNORMAL LOW (ref 3.5–5.2)
Alkaline Phosphatase: 82 U/L (ref 39–117)
BUN: 17 mg/dL (ref 6–23)
CO2: 21 mEq/L (ref 19–32)
Calcium: 9.4 mg/dL (ref 8.4–10.5)
Chloride: 99 mEq/L (ref 96–112)
Creatinine, Ser: 1.07 mg/dL (ref 0.50–1.35)
GFR calc Af Amer: 80 mL/min — ABNORMAL LOW (ref >90)
GFR calc non Af Amer: 69 mL/min — ABNORMAL LOW (ref >90)
Glucose, Bld: 181 mg/dL — ABNORMAL HIGH (ref 70–99)
Potassium: 4 mEq/L (ref 3.5–5.1)
Sodium: 131 mEq/L — ABNORMAL LOW (ref 135–145)
Total Bilirubin: 0.3 mg/dL (ref 0.3–1.2)
Total Protein: 7 g/dL (ref 6.0–8.3)

## 2012-05-27 LAB — GLUCOSE, CAPILLARY
Glucose-Capillary: 254 mg/dL — ABNORMAL HIGH (ref 70–99)
Glucose-Capillary: 241 mg/dL — ABNORMAL HIGH (ref 70–99)
Glucose-Capillary: 234 mg/dL — ABNORMAL HIGH (ref 70–99)

## 2012-05-27 LAB — PROTIME-INR
INR: 1.18 (ref 0.00–1.49)
Prothrombin Time: 14.8 seconds (ref 11.6–15.2)

## 2012-05-27 LAB — APTT: aPTT: 35 seconds (ref 24–37)

## 2012-05-27 LAB — HEMOGLOBIN A1C: Mean Plasma Glucose: 255 mg/dL — ABNORMAL HIGH (ref <117)

## 2012-05-27 MED ORDER — AMLODIPINE BESYLATE 2.5 MG PO TABS
2.5000 mg | ORAL_TABLET | Freq: Every morning | ORAL | Status: DC
  Administered 2012-05-27 – 2012-06-06 (×11): 2.5 mg via ORAL
  Filled 2012-05-27 (×11): qty 1

## 2012-05-27 MED ORDER — INSULIN ASPART 100 UNIT/ML ~~LOC~~ SOLN: 10.0000 [IU] | Freq: Every day | SUBCUTANEOUS | Status: DC

## 2012-05-27 MED ORDER — INSULIN GLARGINE 100 UNIT/ML ~~LOC~~ SOLN
60.0000 [IU] | Freq: Every day | SUBCUTANEOUS | Status: DC
  Administered 2012-05-27 – 2012-05-31 (×5): 60 [IU] via SUBCUTANEOUS

## 2012-05-27 MED ORDER — INSULIN GLARGINE 100 UNIT/ML ~~LOC~~ SOLN: 50.0000 [IU] | Freq: Two times a day (BID) | SUBCUTANEOUS | Status: DC

## 2012-05-27 MED ORDER — METFORMIN HCL 500 MG PO TABS
500.0000 mg | ORAL_TABLET | Freq: Two times a day (BID) | ORAL | Status: DC
  Filled 2012-05-27 (×2): qty 1

## 2012-05-27 MED ORDER — SIMVASTATIN 20 MG PO TABS
20.0000 mg | ORAL_TABLET | Freq: Every evening | ORAL | Status: DC
  Administered 2012-05-27 – 2012-06-05 (×9): 20 mg via ORAL
  Filled 2012-05-27 (×11): qty 1

## 2012-05-27 MED ORDER — GLIMEPIRIDE 4 MG PO TABS
4.0000 mg | ORAL_TABLET | Freq: Every day | ORAL | Status: DC
  Administered 2012-05-27: 4 mg via ORAL
  Filled 2012-05-27 (×2): qty 1

## 2012-05-27 MED ORDER — DIAZEPAM 5 MG PO TABS
5.0000 mg | ORAL_TABLET | Freq: Two times a day (BID) | ORAL | Status: DC | PRN
  Administered 2012-05-30 – 2012-05-31 (×4): 5 mg via ORAL
  Filled 2012-05-27 (×4): qty 1

## 2012-05-27 MED ORDER — INSULIN ASPART 100 UNIT/ML ~~LOC~~ SOLN
0.0000 [IU] | Freq: Three times a day (TID) | SUBCUTANEOUS | Status: DC
  Administered 2012-05-27: 8 [IU] via SUBCUTANEOUS
  Administered 2012-05-28 (×2): 2 [IU] via SUBCUTANEOUS
  Administered 2012-05-31: 5 [IU] via SUBCUTANEOUS
  Administered 2012-06-03 – 2012-06-05 (×7): 3 [IU] via SUBCUTANEOUS
  Administered 2012-06-05: 5 [IU] via SUBCUTANEOUS
  Administered 2012-06-06: 3 [IU] via SUBCUTANEOUS

## 2012-05-27 MED ORDER — LISINOPRIL 40 MG PO TABS
40.0000 mg | ORAL_TABLET | Freq: Every morning | ORAL | Status: DC
  Administered 2012-05-27 – 2012-06-06 (×11): 40 mg via ORAL
  Filled 2012-05-27 (×11): qty 1

## 2012-05-27 MED ORDER — INSULIN ASPART 100 UNIT/ML ~~LOC~~ SOLN
6.0000 [IU] | Freq: Three times a day (TID) | SUBCUTANEOUS | Status: DC
  Administered 2012-05-27 – 2012-05-28 (×3): 6 [IU] via SUBCUTANEOUS

## 2012-05-27 MED ORDER — INSULIN GLARGINE 100 UNIT/ML ~~LOC~~ SOLN
50.0000 [IU] | Freq: Every day | SUBCUTANEOUS | Status: DC
  Administered 2012-05-28 – 2012-06-01 (×5): 50 [IU] via SUBCUTANEOUS

## 2012-05-27 MED ORDER — METOPROLOL TARTRATE 50 MG PO TABS
50.0000 mg | ORAL_TABLET | Freq: Two times a day (BID) | ORAL | Status: DC
  Administered 2012-05-27 – 2012-06-06 (×21): 50 mg via ORAL
  Filled 2012-05-27 (×22): qty 1

## 2012-05-27 MED ORDER — GLIMEPIRIDE 4 MG PO TABS
4.0000 mg | ORAL_TABLET | Freq: Every day | ORAL | Status: DC
  Filled 2012-05-27: qty 1

## 2012-05-27 MED ORDER — INSULIN ASPART 100 UNIT/ML ~~LOC~~ SOLN
0.0000 [IU] | Freq: Every day | SUBCUTANEOUS | Status: DC
  Administered 2012-05-27: 2 [IU] via SUBCUTANEOUS

## 2012-05-27 NOTE — Progress Notes (Signed)
Inpatient Diabetes Program Recommendations  AACE/ADA: New Consensus Statement on Inpatient Glycemic Control (2013)  Target Ranges:  Prepandial:   less than 140 mg/dL      Peak postprandial:   less than 180 mg/dL (1-2 hours)      Critically ill patients:  140 - 180 mg/dL   Reason for Visit: Hyperglycemia  Pt states blood sugars have recently been high at home.  Rarely has hypoglycemia.  Checks blood sugars several times/day. Results for DEVYNN, SCHEFF (MRN [id number]) as of 05/27/2012 17:00  Ref. Range 05/27/2012 04:41  Sodium Latest Range: 135-145 mEq/L 131 (L)  Potassium Latest Range: 3.5-5.1 mEq/L 4.0  Chloride Latest Range: 96-112 mEq/L 99  CO2 Latest Range: 19-32 mEq/L 21  BUN Latest Range: 6-23 mg/dL 17  Creatinine Latest Range: 0.50-1.35 mg/dL 4.09  Calcium Latest Range: 8.4-10.5 mg/dL 9.4  GFR calc non Af Amer Latest Range: >90 mL/min 69 (L)  GFR calc Af Amer Latest Range: >90 mL/min 80 (L)  Glucose Latest Range: 70-99 mg/dL 811 (H)  Alkaline Phosphatase Latest Range: 39-117 U/L 82  Albumin Latest Range: 3.5-5.2 g/dL 2.3 (L)  AST Latest Range: 0-37 U/L 13  ALT Latest Range: 0-53 U/L 16  Total Protein Latest Range: 6.0-8.3 g/dL 7.0  Total Bilirubin Latest Range: 0.3-1.2 mg/dL 0.3  Results for OLIS, VIVERETTE (MRN [id number]) as of 05/27/2012 17:00  Ref. Range 05/26/2012 15:07 05/26/2012 18:23 05/26/2012 21:22 05/27/2012 10:35 05/27/2012 11:45  Glucose-Capillary Latest Range: 70-99 mg/dL 213 (H) 086 (H) 578 (H) 214 (H) 221 (H)  Results for CONAN, MCMANAWAY (MRN [id number]) as of 05/27/2012 17:00  Ref. Range 03/20/2012 17:18  Hemoglobin A1C Latest Range: <5.7 % 7.5 (H)     Inpatient Diabetes Program Recommendations Correction (SSI): Add Novolog moderate tidwc and hs Insulin - Meal Coverage: D/C Novolog 10 units ac dinner.  Add Novolog 6 units tidwc if pt eats >50% meal Oral Agents: D/C oral agents while inpatient HgbA1C: Need updated HgbA1C to assess glycemic control prior to  hospitalization  Discussed with RN and MD.

## 2012-05-27 NOTE — Progress Notes (Signed)
Subjective:     Patient reports pain as 6 on 0-10 scale.  WBC is elevated-15.3.He somewhat more comfortable now. Will have ID and Hospitalist Consults. He has been on long term Plavix which was Dcd today after discussing this with Dr. Hart Rochester. He has had chronic drainage from his Left Groin Graft site and now has an Epidural Abscess. And a Herniated Lumbar Disc at L-5-S-1.  Objective: Vital signs in last 24 hours: Temp:  [98 F (36.7 C)-99.9 F (37.7 C)] 98 F (36.7 C) (01/08 1400) Pulse Rate:  [98-108] 105  (01/08 1400) Resp:  [16-17] 17  (01/08 1400) BP: (108-134)/(62-73) 134/62 mmHg (01/08 1400) SpO2:  [98 %-99 %] 99 % (01/08 1400)  Intake/Output from previous day: 01/07 0701 - 01/08 0700 In: 1000 [P.O.:480; I.V.:520] Out: 600 [Urine:600] Intake/Output this shift: Total I/O In: 2480 [P.O.:480; I.V.:1600; IV Piggyback:400] Out: 1100 [Urine:1100]   Basename 05/26/12 1058  HGB 11.0*    Basename 05/26/12 1058  WBC 15.3*  RBC 4.06*  HCT 33.4*  PLT 282    Basename 05/27/12 0441 05/26/12 1255  NA 131* 130*  K 4.0 4.5  CL 99 94*  CO2 21 23  BUN 17 23  CREATININE 1.07 1.10  GLUCOSE 181* 298*  CALCIUM 9.4 10.1    Basename 05/27/12 0441 05/26/12 1255  LABPT -- --  INR 1.18 1.22    Neurologically intact  Assessment/Plan:     Up with therapy and Consults as discussed above.  Conya Ellinwood A 05/27/2012, 3:44 PM

## 2012-05-27 NOTE — Consult Note (Signed)
[facility] Medical Consultation  Logan [name] WUJ:[id number] DOB: Jan 20, 1946 DOA: 05/26/2012 PCP: Kirstie Peri, MD   Requesting physician: Dr. Darrelyn Hillock Date of consultation: 05/27/12 Reason for consultation: Management of Diabetes  Impression/Recommendations Active Problems:  Osteomyelitis of vertebra of lumbosacral region Diabetes  1. Osteomyelitis of vertebral or lumbosacral region - Per primary team - Agree with IV antibiotic Vancomycin  2.  Diabetes - Routine CBG monitoring q ac and qhs - Agree with continuing Lantus home dose - Last hgba1c on 03/20/12 7.5 - Hold oral hypoglycemic agents while in house - Add Novolog moderate coverage scale tid wc and hs - Change novolog to 6 units tid wc if patient eats > 50% of meal   I will followup again tomorrow. Please contact me if I can be of assistance in the meanwhile. Thank you for this consultation.  Chief Complaint: Lower back pain  HPI:  67 y/o with history of PVD s/p right to left fem -fem bypass 1997/ aortabifemoral bypass 2005 right to left fem-fem 07/2004. Secondary to occluded righ tlimb of aortobifemoral bypass with history of chronically infected left groin wound at surgical site listed above.  Presented to the hospital with worsening back pain with no history of trauma/injury.  On further work up was found to have an epidural abscess.  We have been consulted for management of patient's diabetes given his history.    While in house patient's blood sugars have ranged from 214-261. Patient denies any fevers, chills, nausea, emesis, hematuria, dysuria, or hypoglycemic symptoms  Review of Systems:  ROS reviewed with patient and otherwise negative other than the things listed above  Past Medical History  Diagnosis Date  . Overweight   . Dyslipidemia   . Cardiomyopathy   . Diabetes mellitus   . PVD (peripheral vascular disease)     right to left fem -fem bypass 1997/ aortabifemoral bypass 2005 right to left fem-fem  07/2004. Secondary to occluded righ tlimb of aortobifemoral bypass  . Tobacco abuse   . Anginal pain   . Hypertension   . Shortness of breath   . Coronary artery disease     taxis DES stent circumflex 02/2003.... residual total distal circumflex and 50% LAD/ cardiolite 08/2006 inferior scar no ischemia  . Degenerative disc disease, lumbar    Past Surgical History  Procedure Date  . Knee surgery   . Axillary-femoral bypass graft 12/20/2011    Procedure: BYPASS GRAFT AXILLA-BIFEMORAL;  Surgeon: Fransisco Hertz, MD;  Location: [facility] OR;  Service: Vascular;  Laterality: Bilateral;  Left aorta bilateral femoral thrombectomy, femoral to femoral thrombectomy, and revision of Femoral to femoral graft. and Thrombectomy of Left tibial artery.  . Fasciotomy 12/20/2011    Procedure: FASCIOTOMY;  Surgeon: Fransisco Hertz, MD;  Location: [facility] OR;  Service: Vascular;  Laterality: Right;  right calf facsicotomy.  . Lower extremity angiogram 12/20/2011    Procedure: LOWER EXTREMITY ANGIOGRAM;  Surgeon: Fransisco Hertz, MD;  Location: [facility] OR;  Service: Vascular;  Laterality: Bilateral;  Intraoperative Abdominal Aortogram with bilateral runoff  . Left-to-right femoral-femoral bypass graft 07-30-2004  DR LAWSON    OCCLUSION RIGHT LIMB AORTOBIFEMORAL BYPASS GRAFT  W/ SEVERE CLAUDICATION  . Aorto to right common femoral and left profunda femoris bypass graft 09-28-2003 DR LAWSON    SEVERE AORTOILIAC OCCLUSIVE DISEASE W/ OCCLUDED FEM-FEM BYPASS OF BOTH LOWER EXTREMITIES  . Right lateral parotidectomy with facial verve preservation 02-25-2001  DR [facility]    NEOPLASM RIGHT PAROTID GLAND  . Coronary angioplasty with stent  placement 02-23-2003  DR STUCKEY    PCI W/ STENTING PROXIMAL CIRCUMFLEX  . Femoral-femoral bypass graft 1997  DR LAWSON    RIGHT TO LEFT  . Debridement leg 12/25/11  . Incision and drainage of wound 02/17/2012    Procedure: IRRIGATION AND DEBRIDEMENT WOUND;  Surgeon: Wayland Denis, DO;  Location: [facility];  Service: Plastics;  Laterality: Bilateral;   Social History:  reports that he quit smoking about 5 months ago. His smoking use included Cigarettes. He quit after 40 years of use. He has never used smokeless tobacco. He reports that he drinks alcohol. He reports that he does not use illicit drugs.  Allergies  Allergen Reactions  . Aspirin Hives  . Penicillins Rash and Other (See Comments)    Immune system shut down    Family History  Problem Relation Age of Onset  . Hypertension Mother     Prior to Admission medications   Medication Sig Start Date End Date Taking? Authorizing Provider  amLODipine (NORVASC) 2.5 MG tablet Take 2.5 mg by mouth every morning.    Yes Historical Provider, MD  aspirin EC 81 MG tablet Take 81 mg by mouth every morning.    Yes Historical Provider, MD  bag balm OINT ointment Apply topically daily at 3 pm. Send jar home with pt 03/30/12  Yes Marlowe Shores, PA  clopidogrel (PLAVIX) 75 MG tablet Take 1 tablet (75 mg total) by mouth daily with breakfast. 12/26/11 12/25/12 Yes Amelia Jo Roczniak, PA  diazepam (VALIUM) 5 MG tablet Take 5 mg by mouth 2 (two) times daily as needed. Back spasms.   Yes Historical Provider, MD  glimepiride (AMARYL) 4 MG tablet Take 4 mg by mouth daily before breakfast.    Yes Historical Provider, MD  HYDROmorphone (DILAUDID) 2 MG tablet Take 2 mg by mouth every 4 (four) hours as needed. Back pain.   Yes Historical Provider, MD  indomethacin (INDOCIN) 50 MG capsule Take 50 mg by mouth daily as needed. Gout flare-up.   Yes Historical Provider, MD  insulin aspart (NOVOLOG) 100 UNIT/ML injection Inject 10 Units into the skin daily before supper.   Yes Historical Provider, MD  insulin glargine (LANTUS) 100 UNIT/ML injection Inject 50-60 Units into the skin 2 (two) times daily. 50 units in the morning and 60 units in the evening   Yes Historical Provider, MD  lisinopril (PRINIVIL,ZESTRIL) 40 MG tablet Take 40 mg by mouth every morning.     Yes Historical Provider, MD  metFORMIN (GLUCOPHAGE) 500 MG tablet Take 500 mg by mouth 2 (two) times daily with a meal.    Yes Historical Provider, MD  metoprolol (LOPRESSOR) 50 MG tablet Take 50 mg by mouth 2 (two) times daily.   Yes Historical Provider, MD  Multiple Vitamins-Minerals (MULTIVITAMIN PO) Take 1 tablet by mouth every morning.    Yes Historical Provider, MD  Omega-3 Fatty Acids (FISH OIL) 1000 MG CAPS Take 1 capsule by mouth 2 (two) times daily.    Yes Historical Provider, MD  oxyCODONE-acetaminophen (PERCOCET/ROXICET) 5-325 MG per tablet Take 1-2 tablets by mouth every 6 (six) hours as needed for pain. 05/02/12  Yes Shelda Jakes, MD  simvastatin (ZOCOR) 20 MG tablet Take 20 mg by mouth every evening.   Yes Historical Provider, MD  ciprofloxacin (CIPRO) 500 MG tablet Take 500 mg by mouth daily.    Historical Provider, MD  sulfamethoxazole-trimethoprim (BACTRIM DS) 800-160 MG per tablet Take 1 tablet by mouth daily.  Historical Provider, MD   Physical Exam: Blood pressure 134/62, pulse 105, temperature 98 F (36.7 C), temperature source Oral, resp. rate 17, height 5' 8.9" (1.75 m), weight 104.8 kg (231 lb 0.7 oz), SpO2 99.00%. Filed Vitals:   05/26/12 2101 05/27/12 0607 05/27/12 1100 05/27/12 1400  BP: 120/73 118/70 108/63 134/62  Pulse: 108 98 102 105  Temp: 99.9 F (37.7 C) 98.6 F (37 C)  98 F (36.7 C)  TempSrc: Oral Oral  Oral  Resp: 16 16  17   Height:      Weight:      SpO2: 98% 98%  99%     General:  Pt alert and awake  Eyes: EOMI, non icteric  ENT: normal exterior appearance, no masses on visual inspection  Neck: supple, no goiter  Cardiovascular: RRR, No MRG  Respiratory: CTA BL, no wheezes  Abdomen: soft, NT, ND  Skin: warm and dry.  Left gauze in place at left groin  Musculoskeletal: no cyanosis or clubbing  Psychiatric: mood and affect appropriate  Neurologic: answers questions appropriately and moves all extremities  Labs on  Admission:  Basic Metabolic Panel:  Lab 05/27/12 1610 05/26/12 1255  NA 131* 130*  K 4.0 4.5  CL 99 94*  CO2 21 23  GLUCOSE 181* 298*  BUN 17 23  CREATININE 1.07 1.10  CALCIUM 9.4 10.1  MG -- --  PHOS -- --   Liver Function Tests:  Lab 05/27/12 0441  AST 13  ALT 16  ALKPHOS 82  BILITOT 0.3  PROT 7.0  ALBUMIN 2.3*   No results found for this basename: LIPASE:5,AMYLASE:5 in the last 168 hours No results found for this basename: AMMONIA:5 in the last 168 hours CBC:  Lab 05/26/12 1058  WBC 15.3*  NEUTROABS 10.4*  HGB 11.0*  HCT 33.4*  MCV 82.3  PLT 282   Cardiac Enzymes: No results found for this basename: CKTOTAL:5,CKMB:5,CKMBINDEX:5,TROPONINI:5 in the last 168 hours BNP: No components found with this basename: POCBNP:5 CBG:  Lab 05/27/12 1717 05/27/12 1145 05/27/12 1035 05/26/12 2122 05/26/12 1823  GLUCAP 251* 221* 214* 241* 254*    Radiological Exams on Admission: Dg Lumbar Spine 2-3 Views  05/26/2012  *RADIOLOGY REPORT*  Clinical Data: History of degenerative disc disease.  LUMBAR SPINE - 2-3 VIEW  Comparison: 05/02/2012.  Findings: There are five non-rib bearing lumbar-type vertebral bodies which are labeled L1-L5. Narrowing of intervertebral disc space at L5-S1 level.  Multilevel marginal osteophyte formation is seen in the spine.  No fracture or bony destruction is seen.  No significant subluxation is evident.  IMPRESSION: The vertebrae were numbered. Narrowing of L5-S1 intervertebral disc space. Multilevel osteophyte formation in the spine .   Original Report Authenticated By: Onalee Hua Call     Time spent: 40 minutes  Penny Pia [facility] Pager [phone]  If 7PM-7AM, please contact night-coverage [email or web address] Password [facility] 05/27/2012, 5:28 PM

## 2012-05-27 NOTE — Progress Notes (Signed)
Patient is currently active with long-term disease management services with [facility] Care Management Program. Spoke with Mr Lapoint and family at bedside. Made him aware that [facility] Care Management will continue to follow upon discharge. Left contact information at bedside. Appreciative of visit.  Raiford Noble MSN-Ed, RN,BSN [facility] Liaison [phone]

## 2012-05-28 DIAGNOSIS — F17200 Nicotine dependence, unspecified, uncomplicated: Secondary | ICD-10-CM

## 2012-05-28 DIAGNOSIS — I739 Peripheral vascular disease, unspecified: Secondary | ICD-10-CM

## 2012-05-28 DIAGNOSIS — I70219 Atherosclerosis of native arteries of extremities with intermittent claudication, unspecified extremity: Secondary | ICD-10-CM

## 2012-05-28 DIAGNOSIS — G061 Intraspinal abscess and granuloma: Secondary | ICD-10-CM

## 2012-05-28 LAB — GLUCOSE, CAPILLARY
Glucose-Capillary: 144 mg/dL — ABNORMAL HIGH (ref 70–99)
Glucose-Capillary: 134 mg/dL — ABNORMAL HIGH (ref 70–99)

## 2012-05-28 MED ORDER — SILVER NITRATE-POT NITRATE 75-25 % EX MISC
1.0000 | Freq: Every day | CUTANEOUS | Status: AC
  Administered 2012-05-28 – 2012-06-03 (×6): 1 via TOPICAL
  Filled 2012-05-28 (×2): qty 1

## 2012-05-28 MED ORDER — DEXTROSE 5 % IV SOLN
2.0000 g | INTRAVENOUS | Status: DC
  Administered 2012-05-28 – 2012-06-05 (×9): 2 g via INTRAVENOUS
  Filled 2012-05-28 (×10): qty 2

## 2012-05-28 NOTE — Progress Notes (Addendum)
[facility] PROGRESS NOTE  Logan [name] UJW:[id number] DOB: [birth year] DOA: 05/26/2012 PCP: Kirstie Peri, MD  Assessment/Plan: Diabetes mellitus type 2 -Poorly controlled -Hemoglobin A1c 10.5 -Patient endorses noncompliance, has been off of insulin for about one month--patient states due to pain -Patient has also been off metformin for about one month -Continue current dose of Lantus and NovoLog sliding scale and monitor blood sugars and make adjustments -CBG much better controlled today Hyperlipidemia -Patient also not compliant with Zocor at home -Check lipid panel in the morning Hypertension -Patient not compliant with all antihypertensives at home -Continue metoprolol, lisinopril, amlodipine -Blood pressures relatively well controlled Epidural abscess -Antibiotics per ID -Awaiting debridement after 5 days off Plavix -Pain management per Dr. Darrelyn Hillock -Restart Valium 5 mg twice a day when necessary back spasms Hypoalbuminemia -check prealbumin    Family Communication:   Pt at beside Disposition Plan:   Home when medically stable  Antibiotics:  Vancomycin 05/27/2012>>>  Ceftriaxone 05/28/2012>>>    Procedures/Studies: Ct Abdomen Pelvis Wo Contrast  05/02/2012  *RADIOLOGY REPORT*  Clinical Data: Severe abdominal and back pain.  Previous abdominal aortic aneurysm repair.  CT ABDOMEN AND PELVIS WITHOUT CONTRAST  Technique:  Multidetector CT imaging of the abdomen and pelvis was performed following the standard protocol without intravenous contrast.  Comparison: 03/20/2012  Findings: Stable appearance of the aortobifemoral bypass graft noted.  To fem-fem bypass grafts are again demonstrated. No evidence of retroperitoneal hemorrhage or mass.  Mildly enlarged prostate gland is stable. No lymphadenopathy seen within the pelvis or abdomen.  The abdominal parenchymal organs have a normal appearance on this noncontrast study.  No evidence of hydronephrosis.  Gallbladder is  unremarkable.  No soft tissue masses identified.  Diverticulosis is again noted, however there is no evidence of diverticulitis.  Other inflammatory process or abnormal fluid collections are identified.  IMPRESSION:  1.  No acute findings. 2.  Diverticulosis.  No radiographic evidence of diverticulitis.   Original Report Authenticated By: Myles Rosenthal, M.D.    Dg Chest 1 View  05/02/2012  *RADIOLOGY REPORT*  Clinical Data: Severe back pain, diabetes, hypertension, coronary artery disease, former smoker  CHEST - 1 VIEW  Comparison: 12/20/2011  Findings: Mild elevation of right diaphragm. Upper normal heart size. Slight pulmonary vascular congestion. Mediastinal contours normal. Lungs grossly clear. No pleural effusion or pneumothorax.  IMPRESSION: No definite acute abnormalities. Mild elevation of right diaphragm.   Original Report Authenticated By: Ulyses Southward, M.D.    Dg Lumbar Spine 2-3 Views  05/26/2012  *RADIOLOGY REPORT*  Clinical Data: History of degenerative disc disease.  LUMBAR SPINE - 2-3 VIEW  Comparison: 05/02/2012.  Findings: There are five non-rib bearing lumbar-type vertebral bodies which are labeled L1-L5. Narrowing of intervertebral disc space at L5-S1 level.  Multilevel marginal osteophyte formation is seen in the spine.  No fracture or bony destruction is seen.  No significant subluxation is evident.  IMPRESSION: The vertebrae were numbered. Narrowing of L5-S1 intervertebral disc space. Multilevel osteophyte formation in the spine .   Original Report Authenticated By: Onalee Hua Call    Dg Lumbar Spine Complete  05/02/2012  *RADIOLOGY REPORT*  Clinical Data: Back pain  LUMBAR SPINE - COMPLETE 4+ VIEW  Comparison: CT 03/20/2012  Findings: Normal alignment of the lumbar vertebral bodies.  There is bulky endplate osteophytosis from L3-L5.  There is disc space narrowing at L5-S1.  There is facet hypertrophy at L4 and L5. Normal alignment.  No subluxation.  IMPRESSION: Multilevel disc osteophytic  disease not changed from comparison CTN  Original Report Authenticated By: Genevive Bi, M.D.    Ct Lumbar Spine Wo Contrast  05/02/2012  *RADIOLOGY REPORT*  Clinical Data: The patient began to experience severe back pain on Wednesday.  Unable to stand.  Right hip pain.  CT LUMBAR SPINE WITHOUT CONTRAST  Technique:  Multidetector CT imaging of the lumbar spine was performed without intravenous contrast administration. Multiplanar CT image reconstructions were also generated.  Comparison: CT abdomen and pelvis 03/20/2012.  Findings: Anatomic alignment.  No compression fracture or traumatic subluxation.  Congenitally short pedicles.  Exuberant osteophyte formation anteriorly.  Calcification or ossification of the posterior longitudinal ligament. No paravertebral masses.  The individual disc spaces are examined as follows:  L1-2:    No disc protrusion or spinal stenosis.  Mild facet arthropathy.  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  No disc protrusion.  Mild facet arthropathy.  No neural compression.  L3-4:  No disc protrusion.  Borderline spinal stenosis.  Moderate facet and ligamentum flavum hypertrophy.  L4-5:  Mild annular bulging.  Moderate facet and ligamentum flavum hypertrophy.  Borderline canal stenosis without L5 nerve root encroachment.  No significant foraminal narrowing.  L5-S1:  Calcified central protrusion.  Moderate facet arthropathy. Dorsal displacement both S1 nerve roots.  No significant foraminal narrowing.  Compared with prior CT abdomen using multiplanar reformatted images from November, a similar appearance to the lumbar spine is noted.  IMPRESSION: Multilevel lumbosacral spondylosis as described.  Symptomatic neural compression most likely to occur at L5-S1 where there is a calcified central protrusion.  This appearance however is stable compared with November.   Original Report Authenticated By: Davonna Belling, M.D.           Subjective: Patient complains of pain in his back. Denies any fevers, chills, chest pain, shortness breath, nausea, vomiting, diarrhea, abdominal pain, dysuria, hematuria  Objective: Filed Vitals:   05/27/12 2112 05/28/12 0011 05/28/12 0526 05/28/12 1340  BP: 129/51  170/95 123/68  Pulse: 99 93 85 89  Temp: 99.6 F (37.6 C)  99 F (37.2 C) 98.5 F (36.9 C)  TempSrc: Oral  Oral   Resp: 18 16 18 16   Height:      Weight:      SpO2: 97% 96% 98% 99%    Intake/Output Summary (Last 24 hours) at 05/28/12 2021 Last data filed at 05/28/12 1800  Gross per 24 hour  Intake 2784.32 ml  Output   2440 ml  Net 344.32 ml   Weight change:  Exam:   General:  Pt is alert, follows commands appropriately, not in acute distress  HEENT: No icterus, No thrush, No neck mass, [place]/AT  Cardiovascular: RRR, S1/S2, no rubs, no gallops  Respiratory: CTA bilaterally, no wheezing, no crackles, no rhonchi  Abdomen: Soft/+BS, non tender, non distended, no guarding  Extremities: trace edema, No lymphangitis, No petechiae, No rashes, no synovitis  Data Reviewed: Basic Metabolic Panel:  Lab 05/27/12 6213 05/26/12 1255  NA 131* 130*  K 4.0 4.5  CL 99 94*  CO2 21 23  GLUCOSE 181* 298*  BUN 17 23  CREATININE 1.07 1.10  CALCIUM 9.4 10.1  MG -- --  PHOS -- --   Liver Function Tests:  Lab 05/27/12 0441  AST 13  ALT 16  ALKPHOS 82  BILITOT 0.3  PROT 7.0  ALBUMIN 2.3*   No results found for this basename: LIPASE:5,AMYLASE:5 in the last 168 hours No results found for this basename: AMMONIA:5 in the last 168  hours CBC:  Lab 05/26/12 1058  WBC 15.3*  NEUTROABS 10.4*  HGB 11.0*  HCT 33.4*  MCV 82.3  PLT 282   Cardiac Enzymes: No results found for this basename: CKTOTAL:5,CKMB:5,CKMBINDEX:5,TROPONINI:5 in the last 168 hours BNP: No components found with this basename: POCBNP:5 CBG:  Lab 05/28/12 1728 05/28/12 1155 05/28/12 0719 05/27/12 2116 05/27/12 1717  GLUCAP 134*  144* 113* 234* 251*    No results found for this or any previous visit (from the past 240 hour(s)).   Scheduled Meds:   . amLODipine  2.5 mg Oral q morning - 10a  . cefTRIAXone (ROCEPHIN)  IV  2 g Intravenous Q24H  . insulin aspart  0-15 Units Subcutaneous TID WC  . insulin aspart  0-5 Units Subcutaneous QHS  . insulin aspart  6 Units Subcutaneous TID WC  . insulin glargine  50 Units Subcutaneous Daily  . insulin glargine  60 Units Subcutaneous QHS  . lisinopril  40 mg Oral q morning - 10a  . metoprolol  50 mg Oral BID  . silver nitrate applicators  1 Stick Topical Daily  . simvastatin  20 mg Oral QPM  . sodium chloride  3 mL Intravenous Q12H  . vancomycin  1,000 mg Intravenous Q12H   Continuous Infusions:   . sodium chloride 20 mL/hr at 05/28/12 1152  . lactated ringers       Koston Hennes, DO  [facility] Pager [phone]  If 7PM-7AM, please contact night-coverage [email or web address] Password [email or web address] 05/28/2012, 8:21 PM   LOS: 2 days

## 2012-05-28 NOTE — Consult Note (Signed)
[facility]     Reason for Consult:epidural abscess    Referring Physician: Dr. Darrelyn Hillock  Active Problems:  DM  Osteomyelitis of vertebra of lumbosacral region      . amLODipine  2.5 mg Oral q morning - 10a  . insulin aspart  0-15 Units Subcutaneous TID WC  . insulin aspart  0-5 Units Subcutaneous QHS  . insulin aspart  6 Units Subcutaneous TID WC  . insulin glargine  50 Units Subcutaneous Daily  . insulin glargine  60 Units Subcutaneous QHS  . lisinopril  40 mg Oral q morning - 10a  . metoprolol  50 mg Oral BID  . silver nitrate applicators  1 Stick Topical Daily  . simvastatin  20 mg Oral QPM  . sodium chloride  3 mL Intravenous Q12H  . vancomycin  1,000 mg Intravenous Q12H    Recommendations: Continue vancomycin I will add ceftriaxone 2 grams IV q 24 hours I will check baseline ESR and CRP   Assessment: He has an epidural abscess (MRI results not available at this time).  He also has had a chronic wound from a graft site though I have not found any positive cultures of any organism.   Surgery will be done once off of Plavix for enough time.    Antibiotics: Vancomycin Day 2  HPI: Logan [name] is a 67 y.o. male with a history of PVD requiring numerous procedures including femoral-femoral bypass complicated by occlusion and thrombectomies with fasciotomies who presented to the ED initially with acute onset of back pain that started about 04/29/12 and CT at that time did not show any significant infectious findings, though was notable for L5-S1 neural compression.  He then saw his PCP about 1-2 weeks later who apparently repeated an xray and noted abnormal findings (done in [place]).  He was then referred to orthopedics, Dr. Darrelyn Hillock who performed an MRI and noted an epidural abscess and lumbar disc herniation.  He has been on multiple courses of antibiotics since his first admission in August of 2012 for the graft including Bactrim, cipro and recently  was taking cipro for the last month.  He also tells me he was taking an IV antibiotic at rehab for a prolonged period but does not know the name of it.  He does have a penicillin allergy but from what he can recall, he has taken a cephalosporin previously.  His allergy is a rash that occurred many decades ago.  No hives or throat swelling.  His drainage of the groin wound has subsided.    Thanks for the consult.     Review of Systems: Pertinent items are noted in HPI.  Past Medical History  Diagnosis Date  . Overweight   . Dyslipidemia   . Cardiomyopathy   . Diabetes mellitus   . PVD (peripheral vascular disease)     right to left fem -fem bypass 1997/ aortabifemoral bypass 2005 right to left fem-fem 07/2004. Secondary to occluded righ tlimb of aortobifemoral bypass  . Tobacco abuse   . Anginal pain   . Hypertension   . Shortness of breath   . Coronary artery disease     taxis DES stent circumflex 02/2003.... residual total distal circumflex and 50% LAD/ cardiolite 08/2006 inferior scar no ischemia  . Degenerative disc disease, lumbar     History  Substance Use Topics  . Smoking status: Former Smoker -- 40 years    Types: Cigarettes    Quit date: 12/20/2011  .  Smokeless tobacco: Never Used  . Alcohol Use: Yes     Comment: liquior sometimes    Family History  Problem Relation Age of Onset  . Hypertension Mother    Allergies  Allergen Reactions  . Aspirin Hives  . Penicillins Rash and Other (See Comments)    Immune system shut down     OBJECTIVE: Blood pressure 170/95, pulse 85, temperature 99 F (37.2 C), temperature source Oral, resp. rate 18, height 5' 8.9" (1.75 m), weight 231 lb 0.7 oz (104.8 kg), SpO2 98.00%. General: Awake, alert, nad Skin: no rashes Lungs: CTA B Cor: RRR without m/r/g  Microbiology: No results found for this or any previous visit (from the past 240 hour(s)).  Staci Righter, MD [facility] [phone] pager  [phone] cell 05/28/2012, 12:46 PM

## 2012-05-28 NOTE — Progress Notes (Signed)
Subjective:     Patient reports pain as 6 on 0-10 scale.    Objective: Vital signs in last 24 hours: Temp:  [98.5 F (36.9 C)-99.6 F (37.6 C)] 98.5 F (36.9 C) (01/09 1340) Pulse Rate:  [85-99] 89  (01/09 1340) Resp:  [16-18] 16  (01/09 1340) BP: (123-170)/(51-95) 123/68 mmHg (01/09 1340) SpO2:  [96 %-99 %] 99 % (01/09 1340)  Intake/Output from previous day: 01/08 0701 - 01/09 0700 In: 5190 [P.O.:1560; I.V.:3030; IV Piggyback:600] Out: 2940 [Urine:2940] Intake/Output this shift: Total I/O In: 480 [P.O.:480] Out: 550 [Urine:550]   Basename 05/26/12 1058  HGB 11.0*    Basename 05/26/12 1058  WBC 15.3*  RBC 4.06*  HCT 33.4*  PLT 282    Basename 05/27/12 0441 05/26/12 1255  NA 131* 130*  K 4.0 4.5  CL 99 94*  CO2 21 23  BUN 17 23  CREATININE 1.07 1.10  GLUCOSE 181* 298*  CALCIUM 9.4 10.1    Basename 05/27/12 0441 05/26/12 1255  LABPT -- --  INR 1.18 1.22    Neurovascular intact  Assessment/Plan:     Up with therapy. Still has severe back Pain but remains Neurologically intact.He was seen by ID and Hospitalist and the will follow . Waiting for the proper time for his Plavix effect to expire prior to doing his Spinal Surgery.  Logan [name] A 05/28/2012, 4:48 PM

## 2012-05-28 NOTE — Consult Note (Signed)
WOC consult Note Reason for Consult:chronic non-healing wound in left groin Wound type: non-healing surgical Pressure Ulcer POA: No Measurement:.5cm round x .2cm depth with some hypergranulation tissue Wound ZOX:WRUE, moist with occasional bleeding consistent with hypergranulation Drainage (amount, consistency, odor) small amount serosanguinous Periwound:intact Dressing procedure/placement/frequency: silver nitrate sick application to cauterize hypergranulation tissue daily x 7 days with dry dressing (small) to cover. Hypergranulation occurs often in areas that are well vascularized and with increased moisture.  Should not require follow up, but if this does not dry upon next presentation to PCP or orthopedic MD, might consider the presence of a foreign body (suture). I will not follow.  Please re-consult if needed. Thanks, Ladona Mow, MSN, RN, [facility], CWOCN [phone])

## 2012-05-28 NOTE — Care Management Note (Signed)
  Page 2 of 2   06/04/2012     11:28:07 AM   CARE MANAGEMENT NOTE 06/04/2012  Patient:  Logan [name], Logan [name]   Account Number:  [id number]  Date Initiated:  05/28/2012  Documentation initiated by:  Colleen Can  Subjective/Objective Assessment:   dx Liumbar disc infection     Action/Plan:   CM spoke with patient.  Pt is currently unsure of d/c plans. Surg is currently pending. He is from home in [place]. Lives with spouse. Already has RW, crutches, tiolet seat. If [facility] services are needed wishes to use [facility].   Anticipated DC Date:  05/31/2012   Anticipated DC Plan:  HOME W HOME HEALTH SERVICES  In-house referral  Clinical Social Worker      DC Planning Services  CM consult      Choice offered to / List presented to:             Status of service:  In process, will continue to follow Medicare Important Message given?   (If response is "NO", the following Medicare IM given date fields will be blank) Date Medicare IM given:   Date Additional Medicare IM given:    Discharge Disposition:    Per UR Regulation:  Reviewed for med. necessity/level of care/duration of stay  If discussed at Long Length of Stay Meetings, dates discussed:   06/02/2012  06/04/2012    Comments:  06/04/2012 Colleen Can BSN RN CCM [phone] Post op lumbarr laminectomy/decompression L5-S1 01/14. Per pt/ot note pt having back spasms and pain(sat on side on bed). CM will follow progress for d/c needs.  05/29/2012 Colleen Can BSN RN CCM Pt awaiting for surgery,needs to be off plavix for 5 days per progress notes; Picc line placemnt today; con't IV abx.  05/28/2012 Damaris Schooner RN CCM [phone]  CM will follow for discharge needs.

## 2012-05-29 ENCOUNTER — Inpatient Hospital Stay (HOSPITAL_COMMUNITY): Payer: Medicare Other

## 2012-05-29 DIAGNOSIS — I10 Essential (primary) hypertension: Secondary | ICD-10-CM

## 2012-05-29 LAB — BASIC METABOLIC PANEL
BUN: 13 mg/dL (ref 6–23)
Chloride: 99 mEq/L (ref 96–112)
Glucose, Bld: 132 mg/dL — ABNORMAL HIGH (ref 70–99)
Potassium: 3.8 mEq/L (ref 3.5–5.1)

## 2012-05-29 LAB — GLUCOSE, CAPILLARY
Glucose-Capillary: 63 mg/dL — ABNORMAL LOW (ref 70–99)
Glucose-Capillary: 80 mg/dL (ref 70–99)

## 2012-05-29 LAB — LIPID PANEL
HDL: 26 mg/dL — ABNORMAL LOW (ref >39)
LDL Cholesterol: 54 mg/dL (ref 0–99)
Total CHOL/HDL Ratio: 4.2 RATIO

## 2012-05-29 LAB — SEDIMENTATION RATE: Sed Rate: 131 mm/hr — ABNORMAL HIGH (ref 0–16)

## 2012-05-29 MED ORDER — SENNA 8.6 MG PO TABS
2.0000 | ORAL_TABLET | Freq: Every day | ORAL | Status: DC
  Administered 2012-05-30 – 2012-06-02 (×4): 17.2 mg via ORAL
  Filled 2012-05-29: qty 2
  Filled 2012-05-29: qty 1
  Filled 2012-05-29 (×2): qty 2

## 2012-05-29 MED ORDER — SODIUM CHLORIDE 0.9 % IJ SOLN
10.0000 mL | INTRAMUSCULAR | Status: DC | PRN
  Administered 2012-05-31: 10 mL

## 2012-05-29 MED ORDER — DOCUSATE SODIUM 100 MG PO CAPS
100.0000 mg | ORAL_CAPSULE | Freq: Two times a day (BID) | ORAL | Status: DC
  Administered 2012-05-29 – 2012-06-02 (×8): 100 mg via ORAL

## 2012-05-29 NOTE — Progress Notes (Signed)
    [facility]  Date of Admission:  05/26/2012  Antibiotics: Vanco Day 4 Ceftriaxone 2  Subjective: No complaints, no diarrhea  Objective: Temp:  [98.9 F (37.2 C)-99.6 F (37.6 C)] 99.6 F (37.6 C) (01/10 1407) Pulse Rate:  [88-101] 101  (01/10 1407) Resp:  [14-16] 16  (01/10 1407) BP: (139-158)/(79-90) 154/80 mmHg (01/10 1407) SpO2:  [97 %-100 %] 100 % (01/10 1407)  General: Awake, alert, nad Skin: no rashes Lungs: CTA B Cor: RRR without m/r/g   Lab Results Lab Results  Component Value Date   WBC 15.3* 05/26/2012   HGB 11.0* 05/26/2012   HCT 33.4* 05/26/2012   MCV 82.3 05/26/2012   PLT 282 05/26/2012    Lab Results  Component Value Date   CREATININE 1.07 05/27/2012   BUN 17 05/27/2012   NA 131* 05/27/2012   K 4.0 05/27/2012   CL 99 05/27/2012   CO2 21 05/27/2012    Lab Results  Component Value Date   ALT 16 05/27/2012   AST 13 05/27/2012   ALKPHOS 82 05/27/2012   BILITOT 0.3 05/27/2012      Microbiology: No results found for this or any previous visit (from the past 240 hour(s)).  Studies/Results: No results found.  Assessment/Plan: 1) epidural abscess - to get debrided next week.  On broad spectrum empiric antibiotics.  Will need prolonged course.  Has picc line in.   -continue vanco/ceftriaxone -I will follow up again Monday, Dr. Ninetta Lights though available over the weekend if needed.  Staci Righter, MD  [facility] [phone] pager   05/29/2012, 2:36 PM

## 2012-05-29 NOTE — Progress Notes (Signed)
[facility] PROGRESS NOTE  HOKE BAER XLK:[id number] DOB: 1945-05-22 DOA: 05/26/2012 PCP: Kirstie Peri, MD  Assessment/Plan: Diabetes mellitus type 2  -Poorly controlled  -Hemoglobin A1c 10.5  -Patient endorses noncompliance, has been off of insulin for about one month--patient states due to pain  -Patient has also been off metformin for about one month  -Continue current dose of Lantus and NovoLog sliding scale and monitor blood sugars and make adjustments  -CBG much better controlled -bedtime snack and stop HS novolog coverage to help with am sugar Hyperlipidemia  -Patient also not compliant with Zocor at home  -Check lipid panel in the morning  -LDL 26 Hypertension  -Patient not compliant with all antihypertensives at home  -Continue metoprolol, lisinopril, amlodipine  -Blood pressures have been labile Epidural abscess  -Antibiotics per ID  -Awaiting debridement after 5 days off Plavix -Pain management per Dr. Darrelyn Hillock  -Restart Valium 5 mg twice a day when necessary back spasms  Mild protein calorie malnutrition -prealbumin 16.3 -glucerna with meals Constipation -Add Colace and Senokot    Family Communication:   wife at beside Disposition Plan:   Home when medically stable  Antibiotics:  Vancomycin 05/27/2012>>>  Ceftriaxone 05/28/2012>>>       Procedures/Studies: Ct Abdomen Pelvis Wo Contrast  05/02/2012  *RADIOLOGY REPORT*  Clinical Data: Severe abdominal and back pain.  Previous abdominal aortic aneurysm repair.  CT ABDOMEN AND PELVIS WITHOUT CONTRAST  Technique:  Multidetector CT imaging of the abdomen and pelvis was performed following the standard protocol without intravenous contrast.  Comparison: 03/20/2012  Findings: Stable appearance of the aortobifemoral bypass graft noted.  To fem-fem bypass grafts are again demonstrated. No evidence of retroperitoneal hemorrhage or mass.  Mildly enlarged prostate gland is stable. No lymphadenopathy seen within  the pelvis or abdomen.  The abdominal parenchymal organs have a normal appearance on this noncontrast study.  No evidence of hydronephrosis.  Gallbladder is unremarkable.  No soft tissue masses identified.  Diverticulosis is again noted, however there is no evidence of diverticulitis.  Other inflammatory process or abnormal fluid collections are identified.  IMPRESSION:  1.  No acute findings. 2.  Diverticulosis.  No radiographic evidence of diverticulitis.   Original Report Authenticated By: Myles Rosenthal, M.D.    Dg Chest 1 View  05/02/2012  *RADIOLOGY REPORT*  Clinical Data: Severe back pain, diabetes, hypertension, coronary artery disease, former smoker  CHEST - 1 VIEW  Comparison: 12/20/2011  Findings: Mild elevation of right diaphragm. Upper normal heart size. Slight pulmonary vascular congestion. Mediastinal contours normal. Lungs grossly clear. No pleural effusion or pneumothorax.  IMPRESSION: No definite acute abnormalities. Mild elevation of right diaphragm.   Original Report Authenticated By: Ulyses Southward, M.D.    Dg Lumbar Spine 2-3 Views  05/26/2012  *RADIOLOGY REPORT*  Clinical Data: History of degenerative disc disease.  LUMBAR SPINE - 2-3 VIEW  Comparison: 05/02/2012.  Findings: There are five non-rib bearing lumbar-type vertebral bodies which are labeled L1-L5. Narrowing of intervertebral disc space at L5-S1 level.  Multilevel marginal osteophyte formation is seen in the spine.  No fracture or bony destruction is seen.  No significant subluxation is evident.  IMPRESSION: The vertebrae were numbered. Narrowing of L5-S1 intervertebral disc space. Multilevel osteophyte formation in the spine .   Original Report Authenticated By: Onalee Hua Call    Dg Lumbar Spine Complete  05/02/2012  *RADIOLOGY REPORT*  Clinical Data: Back pain  LUMBAR SPINE - COMPLETE 4+ VIEW  Comparison: CT 03/20/2012  Findings: Normal alignment of the lumbar vertebral  bodies.  There is bulky endplate osteophytosis from L3-L5.   There is disc space narrowing at L5-S1.  There is facet hypertrophy at L4 and L5. Normal alignment.  No subluxation.  IMPRESSION: Multilevel disc osteophytic disease not changed from comparison CTN   Original Report Authenticated By: Genevive Bi, M.D.    Ct Lumbar Spine Wo Contrast  05/02/2012  *RADIOLOGY REPORT*  Clinical Data: The patient began to experience severe back pain on Wednesday.  Unable to stand.  Right hip pain.  CT LUMBAR SPINE WITHOUT CONTRAST  Technique:  Multidetector CT imaging of the lumbar spine was performed without intravenous contrast administration. Multiplanar CT image reconstructions were also generated.  Comparison: CT abdomen and pelvis 03/20/2012.  Findings: Anatomic alignment.  No compression fracture or traumatic subluxation.  Congenitally short pedicles.  Exuberant osteophyte formation anteriorly.  Calcification or ossification of the posterior longitudinal ligament. No paravertebral masses.  The individual disc spaces are examined as follows:  L1-2:    No disc protrusion or spinal stenosis.  Mild facet arthropathy.  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  No disc protrusion.  Mild facet arthropathy.  No neural compression.  L3-4:  No disc protrusion.  Borderline spinal stenosis.  Moderate facet and ligamentum flavum hypertrophy.  L4-5:  Mild annular bulging.  Moderate facet and ligamentum flavum hypertrophy.  Borderline canal stenosis without L5 nerve root encroachment.  No significant foraminal narrowing.  L5-S1:  Calcified central protrusion.  Moderate facet arthropathy. Dorsal displacement both S1 nerve roots.  No significant foraminal narrowing.  Compared with prior CT abdomen using multiplanar reformatted images from November, a similar appearance to the lumbar spine is noted.  IMPRESSION: Multilevel lumbosacral spondylosis as described.  Symptomatic neural compression most likely to occur at L5-S1 where there is a  calcified central protrusion.  This appearance however is stable compared with November.   Original Report Authenticated By: Davonna Belling, M.D.    Ir Fluoro Guide Cv Line Right  05/29/2012  *RADIOLOGY REPORT*  Clinical Data: Lumbar osteomyelitis.  PICC LINE PLACEMENT WITH ULTRASOUND AND FLUOROSCOPIC  GUIDANCE  Fluoroscopy Time: 0.1 minutes.  The right arm was prepped with chlorhexidine, draped in the usual sterile fashion using maximum barrier technique (cap and mask, sterile gown, sterile gloves, large sterile sheet, hand hygiene and cutaneous antisepsis) and infiltrated locally with 1% Lidocaine.  Ultrasound demonstrated patency of the right basilic vein, and this was documented with an image.  Under real-time ultrasound guidance, this vein was accessed with a 21 gauge micropuncture needle and image documentation was performed.  The needle was exchanged over a guidewire for a peel-away sheath through which a 5 [place] single lumen PICC trimmed to 40 cm was advanced, positioned with its tip at the lower SVC/right atrial junction.  Fluoroscopy during the procedure and fluoro spot radiograph confirms appropriate catheter position.  The catheter was flushed, secured to the skin with Prolene sutures, and covered with a sterile dressing.  Complications:  None  IMPRESSION: Successful right arm PICC line placement with ultrasound and fluoroscopic guidance.  The catheter is ready for use.   Original Report Authenticated By: Irish Lack, M.D.    Ir US Guide Vasc Access Right  05/29/2012  *RADIOLOGY REPORT*  Clinical Data: Lumbar osteomyelitis.  PICC LINE PLACEMENT WITH ULTRASOUND AND FLUOROSCOPIC  GUIDANCE  Fluoroscopy Time: 0.1 minutes.  The right arm was prepped with chlorhexidine, draped in the usual sterile fashion using maximum barrier technique (cap and mask, sterile gown, sterile  gloves, large sterile sheet, hand hygiene and cutaneous antisepsis) and infiltrated locally with 1% Lidocaine.  Ultrasound  demonstrated patency of the right basilic vein, and this was documented with an image.  Under real-time ultrasound guidance, this vein was accessed with a 21 gauge micropuncture needle and image documentation was performed.  The needle was exchanged over a guidewire for a peel-away sheath through which a 5 [place] single lumen PICC trimmed to 40 cm was advanced, positioned with its tip at the lower SVC/right atrial junction.  Fluoroscopy during the procedure and fluoro spot radiograph confirms appropriate catheter position.  The catheter was flushed, secured to the skin with Prolene sutures, and covered with a sterile dressing.  Complications:  None  IMPRESSION: Successful right arm PICC line placement with ultrasound and fluoroscopic guidance.  The catheter is ready for use.   Original Report Authenticated By: Irish Lack, M.D.          Subjective: Patient complains of constipation. Denies fevers, chills, chest pain, shortness breath, nausea, vomiting, diarrhea, abdominal pain, headaches,.  Objective: Filed Vitals:   05/28/12 2227 05/29/12 0020 05/29/12 0507 05/29/12 1407  BP:   158/90 154/80  Pulse: 96 90 88 101  Temp:   98.9 F (37.2 C) 99.6 F (37.6 C)  TempSrc:   Oral   Resp:   14 16  Height:      Weight:      SpO2:   98% 100%    Intake/Output Summary (Last 24 hours) at 05/29/12 1910 Last data filed at 05/29/12 1900  Gross per 24 hour  Intake 1723.99 ml  Output   2825 ml  Net -1101.01 ml   Weight change:  Exam:   General:  Pt is alert, follows commands appropriately, not in acute distress  HEENT: No icterus, No thrush,[place]/AT  Cardiovascular: RRR, S1/S2, no rubs, no gallops  Respiratory: CTA bilaterally, no wheezing, no crackles, no rhonchi  Abdomen: Soft/+BS, non tender, non distended, no guarding  Extremities: No edema, No lymphangitis, No petechiae, No rashes, no synovitis  Data Reviewed: Basic Metabolic Panel:  Lab 05/29/12 8119 05/27/12 0441 05/26/12 1255   NA 134* 131* 130*  K 3.8 4.0 4.5  CL 99 99 94*  CO2 24 21 23   GLUCOSE 132* 181* 298*  BUN 13 17 23   CREATININE 1.01 1.07 1.10  CALCIUM 9.7 9.4 10.1  MG -- -- --  PHOS -- -- --   Liver Function Tests:  Lab 05/27/12 0441  AST 13  ALT 16  ALKPHOS 82  BILITOT 0.3  PROT 7.0  ALBUMIN 2.3*   No results found for this basename: LIPASE:5,AMYLASE:5 in the last 168 hours No results found for this basename: AMMONIA:5 in the last 168 hours CBC:  Lab 05/26/12 1058  WBC 15.3*  NEUTROABS 10.4*  HGB 11.0*  HCT 33.4*  MCV 82.3  PLT 282   Cardiac Enzymes: No results found for this basename: CKTOTAL:5,CKMB:5,CKMBINDEX:5,TROPONINI:5 in the last 168 hours BNP: No components found with this basename: POCBNP:5 CBG:  Lab 05/29/12 1303 05/29/12 0759 05/29/12 0729 05/28/12 2153 05/28/12 1728  GLUCAP 133* 80 63* 110* 134*    No results found for this or any previous visit (from the past 240 hour(s)).   Scheduled Meds:   . amLODipine  2.5 mg Oral q morning - 10a  . cefTRIAXone (ROCEPHIN)  IV  2 g Intravenous Q24H  . insulin aspart  0-15 Units Subcutaneous TID WC  . insulin aspart  6 Units Subcutaneous TID WC  . insulin  glargine  50 Units Subcutaneous Daily  . insulin glargine  60 Units Subcutaneous QHS  . lisinopril  40 mg Oral q morning - 10a  . metoprolol  50 mg Oral BID  . silver nitrate applicators  1 Stick Topical Daily  . simvastatin  20 mg Oral QPM  . sodium chloride  3 mL Intravenous Q12H  . vancomycin  1,000 mg Intravenous Q12H   Continuous Infusions:   . sodium chloride 20 mL/hr at 05/28/12 1152  . lactated ringers       Michella Detjen, DO  [facility] Pager [phone]  If 7PM-7AM, please contact night-coverage [email or web address] Password [email or web address] 05/29/2012, 7:10 PM   LOS: 3 days

## 2012-05-29 NOTE — Progress Notes (Signed)
ANTIBIOTIC CONSULT NOTE - FOLLOW UP  Pharmacy Consult for vancomycin Indication: Lumbar disc space infection L5-S1   Allergies  Allergen Reactions  . Aspirin Hives  . Penicillins Rash and Other (See Comments)    Immune system shut down     Patient Measurements: Height: 5' 8.9" (175 cm) (as documented 04/21/12) Weight: 231 lb 0.7 oz (104.8 kg) (as documented 04/21/12) IBW/kg (Calculated) : 70.47  Adjusted Body Weight:   Vital Signs: Temp: 98.9 F (37.2 C) (01/10 0507) Temp src: Oral (01/10 0507) BP: 158/90 mmHg (01/10 0507) Pulse Rate: 88  (01/10 0507) Intake/Output from previous day: 01/09 0701 - 01/10 0700 In: 2196 [P.O.:840; I.V.:906; IV Piggyback:450] Out: 2875 [Urine:2875] Intake/Output from this shift: Total I/O In: 786.7 [P.O.:360; I.V.:226.7; IV Piggyback:200] Out: 1925 [Urine:1925]  Labs:  [facility] 05/27/12 0441 05/26/12 1255 05/26/12 1058  WBC -- -- 15.3*  HGB -- -- 11.0*  PLT -- -- 282  LABCREA -- -- --  CREATININE 1.07 1.10 --   Estimated Creatinine Clearance: 78.7 ml/min (by C-G formula based on Cr of 1.07).  Basename 05/28/12 2345  VANCOTROUGH 16.0  VANCOPEAK --  VANCORANDOM --  GENTTROUGH --  GENTPEAK --  GENTRANDOM --  TOBRATROUGH --  TOBRAPEAK --  TOBRARND --  AMIKACINPEAK --  AMIKACINTROU --  AMIKACIN --     Microbiology: No results found for this or any previous visit (from the past 720 hour(s)).  Anti-infectives     Start     Dose/Rate Route Frequency Ordered Stop   05/28/12 1400   cefTRIAXone (ROCEPHIN) 2 g in dextrose 5 % 50 mL IVPB        2 g 100 mL/hr over 30 Minutes Intravenous Every 24 hours 05/28/12 1304     05/27/12 0000   vancomycin (VANCOCIN) IVPB 1000 mg/200 mL premix        1,000 mg 200 mL/hr over 60 Minutes Intravenous Every 12 hours 05/26/12 1456     05/26/12 1100   vancomycin (VANCOCIN) 2,000 mg in sodium chloride 0.9 % 500 mL IVPB        2,000 mg 250 mL/hr over 120 Minutes Intravenous  Once 05/26/12 1059  05/26/12 1448          Assessment: Patient with vancomycin level at goal.  Goal of Therapy:  Vancomycin trough level 15-20 mcg/ml  Plan:  Measure antibiotic drug levels at steady state Follow up culture results Continue with current dose  Darlina Guys, Jacquenette Shone Crowford 05/29/2012,5:30 AM

## 2012-05-29 NOTE — Procedures (Signed)
Procedure:  Right arm PICC line Access:  Right basilic vein 40 cm SL Power PICC.  Tip:  Cavoatrial junction.  OK to use.

## 2012-05-29 NOTE — Progress Notes (Signed)
Subjective:     Patient reports pain as 7 on 0-10 scale. Second antibiotic started.   Objective: Vital signs in last 24 hours: Temp:  [98.5 F (36.9 C)-99.3 F (37.4 C)] 98.9 F (37.2 C) (01/10 0507) Pulse Rate:  [88-96] 88  (01/10 0507) Resp:  [14-16] 14  (01/10 0507) BP: (123-158)/(68-90) 158/90 mmHg (01/10 0507) SpO2:  [97 %-99 %] 98 % (01/10 0507)  Intake/Output from previous day: 01/09 0701 - 01/10 0700 In: 2196 [P.O.:840; I.V.:906; IV Piggyback:450] Out: 2875 [Urine:2875] Intake/Output this shift:     Basename 05/26/12 1058  HGB 11.0*    Basename 05/26/12 1058  WBC 15.3*  RBC 4.06*  HCT 33.4*  PLT 282    Basename 05/27/12 0441 05/26/12 1255  NA 131* 130*  K 4.0 4.5  CL 99 94*  CO2 21 23  BUN 17 23  CREATININE 1.07 1.10  GLUCOSE 181* 298*  CALCIUM 9.4 10.1    Basename 05/27/12 0441 05/26/12 1255  LABPT -- --  INR 1.18 1.22    Neurologically intact Dorsiflexion/Plantar flexion intact  Assessment/Plan:    Up with therapy.  Waiting for Plavix effect to end prior to major spine surgery. Drainage from left groin. Surgery will be next week. He is being followed by Hospitalist and ID.   Daishon Chui A 05/29/2012, 7:36 AM

## 2012-05-30 LAB — BASIC METABOLIC PANEL
BUN: 13 mg/dL (ref 6–23)
Calcium: 9.8 mg/dL (ref 8.4–10.5)
Creatinine, Ser: 0.97 mg/dL (ref 0.50–1.35)
GFR calc non Af Amer: 83 mL/min — ABNORMAL LOW (ref >90)
Glucose, Bld: 100 mg/dL — ABNORMAL HIGH (ref 70–99)
Potassium: 3.6 mEq/L (ref 3.5–5.1)

## 2012-05-30 LAB — C-REACTIVE PROTEIN: CRP: 5.5 mg/dL — ABNORMAL HIGH (ref <0.60)

## 2012-05-30 LAB — GLUCOSE, CAPILLARY: Glucose-Capillary: 128 mg/dL — ABNORMAL HIGH (ref 70–99)

## 2012-05-30 NOTE — Progress Notes (Signed)
Subjective: The patient sleeping soundly wakes easily with verbal states he still rather sore but no other complaints   Objective: Vital signs in last 24 hours: Temp:  [98.7 F (37.1 C)-99.6 F (37.6 C)] 98.9 F (37.2 C) (01/11 0557) Pulse Rate:  [92-101] 92  (01/11 0557) Resp:  [16] 16  (01/11 0557) BP: (142-154)/(78-80) 143/79 mmHg (01/11 0557) SpO2:  [93 %-100 %] 99 % (01/11 0557)  Intake/Output from previous day: 01/10 0701 - 01/11 0700 In: 1423.3 [P.O.:480; I.V.:493.3; IV Piggyback:450] Out: 1200 [Urine:1200] Intake/Output this shift:    No results found for this basename: HGB:5 in the last 72 hours No results found for this basename: WBC:2,RBC:2,HCT:2,PLT:2 in the last 72 hours  Basename 05/30/12 0405 05/29/12 1355  NA 135 134*  K 3.6 3.8  CL 99 99  CO2 26 24  BUN 13 13  CREATININE 0.97 1.01  GLUCOSE 100* 132*  CALCIUM 9.8 9.7   No results found for this basename: LABPT:2,INR:2 in the last 72 hours  Patient is conscious alert appropriate moves upper extremities round easily his abdomen is soft is neurologically intact lower extremities he appears to be in no distress. His PICC line is in place  Assessment/Plan: Lumbar spine abscess with lumbar disc herniation. Waiting for Plavix to wean off prior to excessive spine surgery PICC line in place  Plan continue current treatment Dr. Darrelyn Hillock we'll see tomorrow plan for surgery first part of next week   Tamma Brigandi W 05/30/2012, 8:01 AM

## 2012-05-30 NOTE — Progress Notes (Signed)
[facility] PROGRESS NOTE  Logan [name] ZOX:[id number] DOB: Mar 07, 1946 DOA: 05/26/2012 PCP: Kirstie Peri, MD  Assessment/Plan: Diabetes mellitus type 2  -good control, but pt not getting sliding scale appropriately -stop premeal novolog 6 units, continue sliding scale -Hemoglobin A1c 10.5  -Patient endorses noncompliance, has been off of insulin for about one month--patient states due to pain  -Patient has also been off metformin for about one month  -Continue current dose of Lantus and NovoLog sliding scale and monitor blood sugars and make adjustments  -CBG much better controlled  -bedtime snack and stop HS novolog coverage to help with am sugar  Hyperlipidemia  -Patient also not compliant with Zocor at home  -Check lipid panel in the morning  -LDL 26  Hypertension  -Patient not compliant with all antihypertensives at home  -Continue metoprolol, lisinopril, amlodipine  -Blood pressures mildly elevated due to pain  Epidural abscess  -Antibiotics per ID  -Awaiting debridement after 5 days off Plavix--tomorrow (05/31/12) is 5th day off  -Pain management per Dr. Darrelyn Hillock  -Restart Valium 5 mg twice a day when necessary back spasms  Mild protein calorie malnutrition  -prealbumin 16.3  -glucerna with meals  Constipation  -Add Colace and Senokot  -had BM today (05/30/12) Family Communication: wife at beside  Disposition Plan: Home when medically stable  Antibiotics:  Vancomycin 05/27/2012>>>  Ceftriaxone 05/28/2012>>>     Procedures/Studies: Ct Abdomen Pelvis Wo Contrast  05/02/2012  *RADIOLOGY REPORT*  Clinical Data: Severe abdominal and back pain.  Previous abdominal aortic aneurysm repair.  CT ABDOMEN AND PELVIS WITHOUT CONTRAST  Technique:  Multidetector CT imaging of the abdomen and pelvis was performed following the standard protocol without intravenous contrast.  Comparison: 03/20/2012  Findings: Stable appearance of the aortobifemoral bypass graft noted.  To fem-fem  bypass grafts are again demonstrated. No evidence of retroperitoneal hemorrhage or mass.  Mildly enlarged prostate gland is stable. No lymphadenopathy seen within the pelvis or abdomen.  The abdominal parenchymal organs have a normal appearance on this noncontrast study.  No evidence of hydronephrosis.  Gallbladder is unremarkable.  No soft tissue masses identified.  Diverticulosis is again noted, however there is no evidence of diverticulitis.  Other inflammatory process or abnormal fluid collections are identified.  IMPRESSION:  1.  No acute findings. 2.  Diverticulosis.  No radiographic evidence of diverticulitis.   Original Report Authenticated By: Myles Rosenthal, M.D.    Dg Chest 1 View  05/02/2012  *RADIOLOGY REPORT*  Clinical Data: Severe back pain, diabetes, hypertension, coronary artery disease, former smoker  CHEST - 1 VIEW  Comparison: 12/20/2011  Findings: Mild elevation of right diaphragm. Upper normal heart size. Slight pulmonary vascular congestion. Mediastinal contours normal. Lungs grossly clear. No pleural effusion or pneumothorax.  IMPRESSION: No definite acute abnormalities. Mild elevation of right diaphragm.   Original Report Authenticated By: Ulyses Southward, M.D.    Dg Lumbar Spine 2-3 Views  05/26/2012  *RADIOLOGY REPORT*  Clinical Data: History of degenerative disc disease.  LUMBAR SPINE - 2-3 VIEW  Comparison: 05/02/2012.  Findings: There are five non-rib bearing lumbar-type vertebral bodies which are labeled L1-L5. Narrowing of intervertebral disc space at L5-S1 level.  Multilevel marginal osteophyte formation is seen in the spine.  No fracture or bony destruction is seen.  No significant subluxation is evident.  IMPRESSION: The vertebrae were numbered. Narrowing of L5-S1 intervertebral disc space. Multilevel osteophyte formation in the spine .   Original Report Authenticated By: Onalee Hua Call    Dg Lumbar Spine Complete  05/02/2012  *  RADIOLOGY REPORT*  Clinical Data: Back pain  LUMBAR SPINE  - COMPLETE 4+ VIEW  Comparison: CT 03/20/2012  Findings: Normal alignment of the lumbar vertebral bodies.  There is bulky endplate osteophytosis from L3-L5.  There is disc space narrowing at L5-S1.  There is facet hypertrophy at L4 and L5. Normal alignment.  No subluxation.  IMPRESSION: Multilevel disc osteophytic disease not changed from comparison CTN   Original Report Authenticated By: Genevive Bi, M.D.    Ct Lumbar Spine Wo Contrast  05/02/2012  *RADIOLOGY REPORT*  Clinical Data: The patient began to experience severe back pain on Wednesday.  Unable to stand.  Right hip pain.  CT LUMBAR SPINE WITHOUT CONTRAST  Technique:  Multidetector CT imaging of the lumbar spine was performed without intravenous contrast administration. Multiplanar CT image reconstructions were also generated.  Comparison: CT abdomen and pelvis 03/20/2012.  Findings: Anatomic alignment.  No compression fracture or traumatic subluxation.  Congenitally short pedicles.  Exuberant osteophyte formation anteriorly.  Calcification or ossification of the posterior longitudinal ligament. No paravertebral masses.  The individual disc spaces are examined as follows:  L1-2:    No disc protrusion or spinal stenosis.  Mild facet arthropathy.  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  No disc protrusion.  Mild facet arthropathy.  No neural compression.  L3-4:  No disc protrusion.  Borderline spinal stenosis.  Moderate facet and ligamentum flavum hypertrophy.  L4-5:  Mild annular bulging.  Moderate facet and ligamentum flavum hypertrophy.  Borderline canal stenosis without L5 nerve root encroachment.  No significant foraminal narrowing.  L5-S1:  Calcified central protrusion.  Moderate facet arthropathy. Dorsal displacement both S1 nerve roots.  No significant foraminal narrowing.  Compared with prior CT abdomen using multiplanar reformatted images from November, a similar appearance to the lumbar  spine is noted.  IMPRESSION: Multilevel lumbosacral spondylosis as described.  Symptomatic neural compression most likely to occur at L5-S1 where there is a calcified central protrusion.  This appearance however is stable compared with November.   Original Report Authenticated By: Davonna Belling, M.D.    Ir Fluoro Guide Cv Line Right  05/29/2012  *RADIOLOGY REPORT*  Clinical Data: Lumbar osteomyelitis.  PICC LINE PLACEMENT WITH ULTRASOUND AND FLUOROSCOPIC  GUIDANCE  Fluoroscopy Time: 0.1 minutes.  The right arm was prepped with chlorhexidine, draped in the usual sterile fashion using maximum barrier technique (cap and mask, sterile gown, sterile gloves, large sterile sheet, hand hygiene and cutaneous antisepsis) and infiltrated locally with 1% Lidocaine.  Ultrasound demonstrated patency of the right basilic vein, and this was documented with an image.  Under real-time ultrasound guidance, this vein was accessed with a 21 gauge micropuncture needle and image documentation was performed.  The needle was exchanged over a guidewire for a peel-away sheath through which a 5 [place] single lumen PICC trimmed to 40 cm was advanced, positioned with its tip at the lower SVC/right atrial junction.  Fluoroscopy during the procedure and fluoro spot radiograph confirms appropriate catheter position.  The catheter was flushed, secured to the skin with Prolene sutures, and covered with a sterile dressing.  Complications:  None  IMPRESSION: Successful right arm PICC line placement with ultrasound and fluoroscopic guidance.  The catheter is ready for use.   Original Report Authenticated By: Irish Lack, M.D.    Ir US Guide Vasc Access Right  05/29/2012  *RADIOLOGY REPORT*  Clinical Data: Lumbar osteomyelitis.  PICC LINE PLACEMENT WITH ULTRASOUND AND FLUOROSCOPIC  GUIDANCE  Fluoroscopy Time:  0.1 minutes.  The right arm was prepped with chlorhexidine, draped in the usual sterile fashion using maximum barrier technique (cap and  mask, sterile gown, sterile gloves, large sterile sheet, hand hygiene and cutaneous antisepsis) and infiltrated locally with 1% Lidocaine.  Ultrasound demonstrated patency of the right basilic vein, and this was documented with an image.  Under real-time ultrasound guidance, this vein was accessed with a 21 gauge micropuncture needle and image documentation was performed.  The needle was exchanged over a guidewire for a peel-away sheath through which a 5 [place] single lumen PICC trimmed to 40 cm was advanced, positioned with its tip at the lower SVC/right atrial junction.  Fluoroscopy during the procedure and fluoro spot radiograph confirms appropriate catheter position.  The catheter was flushed, secured to the skin with Prolene sutures, and covered with a sterile dressing.  Complications:  None  IMPRESSION: Successful right arm PICC line placement with ultrasound and fluoroscopic guidance.  The catheter is ready for use.   Original Report Authenticated By: Irish Lack, M.D.          Subjective: Patient states that pain is better controlled. Denies fevers, chills, chest pain, shortness breath, nausea, vomiting, diarrhea, abdominal, sherry appeared  Objective: Filed Vitals:   05/29/12 1407 05/29/12 2135 05/30/12 0557 05/30/12 1410  BP: 154/80 142/78 143/79 143/77  Pulse: 101 96 92 92  Temp: 99.6 F (37.6 C) 98.7 F (37.1 C) 98.9 F (37.2 C) 97.5 F (36.4 C)  TempSrc:  Oral Oral Oral  Resp: 16 16 16 16   Height:      Weight:      SpO2: 100% 93% 99% 100%    Intake/Output Summary (Last 24 hours) at 05/30/12 1933 Last data filed at 05/30/12 1551  Gross per 24 hour  Intake    721 ml  Output   1551 ml  Net   -830 ml   Weight change:  Exam:   General:  Pt is alert, follows commands appropriately, not in acute distress  HEENT: No icterus, No thrush, [place]/AT  Cardiovascular: RRR, S1/S2, no rubs, no gallops  Respiratory: CTA bilaterally, no wheezing, no crackles, no  rhonchi  Abdomen: Soft/+BS, non tender, non distended, no guarding  Extremities: No edema, No lymphangitis, No petechiae, No rashes, no synovitis  Data Reviewed: Basic Metabolic Panel:  Lab 05/30/12 9604 05/29/12 1355 05/27/12 0441 05/26/12 1255  NA 135 134* 131* 130*  K 3.6 3.8 4.0 4.5  CL 99 99 99 94*  CO2 26 24 21 23   GLUCOSE 100* 132* 181* 298*  BUN 13 13 17 23   CREATININE 0.97 1.01 1.07 1.10  CALCIUM 9.8 9.7 9.4 10.1  MG -- -- -- --  PHOS -- -- -- --   Liver Function Tests:  Lab 05/27/12 0441  AST 13  ALT 16  ALKPHOS 82  BILITOT 0.3  PROT 7.0  ALBUMIN 2.3*   No results found for this basename: LIPASE:5,AMYLASE:5 in the last 168 hours No results found for this basename: AMMONIA:5 in the last 168 hours CBC:  Lab 05/26/12 1058  WBC 15.3*  NEUTROABS 10.4*  HGB 11.0*  HCT 33.4*  MCV 82.3  PLT 282   Cardiac Enzymes: No results found for this basename: CKTOTAL:5,CKMB:5,CKMBINDEX:5,TROPONINI:5 in the last 168 hours BNP: No components found with this basename: POCBNP:5 CBG:  Lab 05/30/12 1821 05/30/12 1110 05/30/12 0717 05/29/12 2136 05/29/12 1303  GLUCAP 149* 128* 88 142* 133*    No results found for this or any previous visit (from the  past 240 hour(s)).   Scheduled Meds:   . amLODipine  2.5 mg Oral q morning - 10a  . cefTRIAXone (ROCEPHIN)  IV  2 g Intravenous Q24H  . docusate sodium  100 mg Oral BID  . insulin aspart  0-15 Units Subcutaneous TID WC  . insulin glargine  50 Units Subcutaneous Daily  . insulin glargine  60 Units Subcutaneous QHS  . lisinopril  40 mg Oral q morning - 10a  . metoprolol  50 mg Oral BID  . senna  2 tablet Oral Daily  . silver nitrate applicators  1 Stick Topical Daily  . simvastatin  20 mg Oral QPM  . sodium chloride  3 mL Intravenous Q12H  . vancomycin  1,000 mg Intravenous Q12H   Continuous Infusions:   . sodium chloride 100 mL/hr at 05/30/12 0900  . lactated ringers       Jearldine Cassady, DO  [facility] Pager [phone]  If 7PM-7AM, please contact night-coverage [email or web address] Password [facility] 05/30/2012, 7:33 PM   LOS: 4 days

## 2012-05-31 LAB — BASIC METABOLIC PANEL
BUN: 14 mg/dL (ref 6–23)
CO2: 26 mEq/L (ref 19–32)
Chloride: 102 mEq/L (ref 96–112)
GFR calc Af Amer: 84 mL/min — ABNORMAL LOW (ref >90)
Glucose, Bld: 76 mg/dL (ref 70–99)
Potassium: 3.5 mEq/L (ref 3.5–5.1)

## 2012-05-31 LAB — GLUCOSE, CAPILLARY
Glucose-Capillary: 166 mg/dL — ABNORMAL HIGH (ref 70–99)
Glucose-Capillary: 229 mg/dL — ABNORMAL HIGH (ref 70–99)

## 2012-05-31 LAB — TYPE AND SCREEN: Antibody Screen: NEGATIVE

## 2012-05-31 NOTE — Progress Notes (Signed)
[facility] PROGRESS NOTE  Logan [name] WUJ:[id number] DOB: 01-19-1946 DOA: 05/26/2012 PCP: Kirstie Peri, MD  Assessment/Plan: Diabetes mellitus type 2  -good control -stop premeal novolog 6 units, continue sliding scale  -Hemoglobin A1c 10.5  -Patient endorses noncompliance, has been off of insulin for about one month--patient states due to pain  -Patient has also been off metformin for about one month  -Continue current dose of Lantus and NovoLog sliding scale  -bedtime snack and stop HS novolog coverage to help with am sugar -pt not receiving bedtime snack  Hyperlipidemia  -Patient also not compliant with Zocor at home  -Check lipid panel in the morning  -LDL 26  Hypertension  -Patient not compliant with all antihypertensives at home  -Continue current metoprolol, lisinopril, amlodipine  -Blood pressures mildly elevated due to pain  Epidural abscess  -medically stable/cleared for surgery -Antibiotics per ID  -Awaiting debridement after 5 days off Plavix--today (05/31/12) is 5th day off  -Pain management per Dr. Darrelyn Hillock  -Restart Valium 5 mg twice a day when necessary back spasms  Mild protein calorie malnutrition  -prealbumin 16.3  -glucerna with meals  Constipation  -Add Colace and Senokot  -has had BMs  Family Communication: wife at beside  Disposition Plan: Home when medically stable  Antibiotics:  Vancomycin 05/27/2012>>>  Ceftriaxone 05/28/2012>>>         Procedures/Studies: Ct Abdomen Pelvis Wo Contrast  05/02/2012  *RADIOLOGY REPORT*  Clinical Data: Severe abdominal and back pain.  Previous abdominal aortic aneurysm repair.  CT ABDOMEN AND PELVIS WITHOUT CONTRAST  Technique:  Multidetector CT imaging of the abdomen and pelvis was performed following the standard protocol without intravenous contrast.  Comparison: 03/20/2012  Findings: Stable appearance of the aortobifemoral bypass graft noted.  To fem-fem bypass grafts are again demonstrated. No evidence  of retroperitoneal hemorrhage or mass.  Mildly enlarged prostate gland is stable. No lymphadenopathy seen within the pelvis or abdomen.  The abdominal parenchymal organs have a normal appearance on this noncontrast study.  No evidence of hydronephrosis.  Gallbladder is unremarkable.  No soft tissue masses identified.  Diverticulosis is again noted, however there is no evidence of diverticulitis.  Other inflammatory process or abnormal fluid collections are identified.  IMPRESSION:  1.  No acute findings. 2.  Diverticulosis.  No radiographic evidence of diverticulitis.   Original Report Authenticated By: Myles Rosenthal, M.D.    Dg Chest 1 View  05/02/2012  *RADIOLOGY REPORT*  Clinical Data: Severe back pain, diabetes, hypertension, coronary artery disease, former smoker  CHEST - 1 VIEW  Comparison: 12/20/2011  Findings: Mild elevation of right diaphragm. Upper normal heart size. Slight pulmonary vascular congestion. Mediastinal contours normal. Lungs grossly clear. No pleural effusion or pneumothorax.  IMPRESSION: No definite acute abnormalities. Mild elevation of right diaphragm.   Original Report Authenticated By: Ulyses Southward, M.D.    Dg Lumbar Spine 2-3 Views  05/26/2012  *RADIOLOGY REPORT*  Clinical Data: History of degenerative disc disease.  LUMBAR SPINE - 2-3 VIEW  Comparison: 05/02/2012.  Findings: There are five non-rib bearing lumbar-type vertebral bodies which are labeled L1-L5. Narrowing of intervertebral disc space at L5-S1 level.  Multilevel marginal osteophyte formation is seen in the spine.  No fracture or bony destruction is seen.  No significant subluxation is evident.  IMPRESSION: The vertebrae were numbered. Narrowing of L5-S1 intervertebral disc space. Multilevel osteophyte formation in the spine .   Original Report Authenticated By: Onalee Hua Call    Dg Lumbar Spine Complete  05/02/2012  *RADIOLOGY REPORT*  Clinical Data: Back pain  LUMBAR SPINE - COMPLETE 4+ VIEW  Comparison: CT 03/20/2012   Findings: Normal alignment of the lumbar vertebral bodies.  There is bulky endplate osteophytosis from L3-L5.  There is disc space narrowing at L5-S1.  There is facet hypertrophy at L4 and L5. Normal alignment.  No subluxation.  IMPRESSION: Multilevel disc osteophytic disease not changed from comparison CTN   Original Report Authenticated By: Genevive Bi, M.D.    Ct Lumbar Spine Wo Contrast  05/02/2012  *RADIOLOGY REPORT*  Clinical Data: The patient began to experience severe back pain on Wednesday.  Unable to stand.  Right hip pain.  CT LUMBAR SPINE WITHOUT CONTRAST  Technique:  Multidetector CT imaging of the lumbar spine was performed without intravenous contrast administration. Multiplanar CT image reconstructions were also generated.  Comparison: CT abdomen and pelvis 03/20/2012.  Findings: Anatomic alignment.  No compression fracture or traumatic subluxation.  Congenitally short pedicles.  Exuberant osteophyte formation anteriorly.  Calcification or ossification of the posterior longitudinal ligament. No paravertebral masses.  The individual disc spaces are examined as follows:  L1-2:    No disc protrusion or spinal stenosis.  Mild facet arthropathy.  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  No disc protrusion.  Mild facet arthropathy.  No neural compression.  L3-4:  No disc protrusion.  Borderline spinal stenosis.  Moderate facet and ligamentum flavum hypertrophy.  L4-5:  Mild annular bulging.  Moderate facet and ligamentum flavum hypertrophy.  Borderline canal stenosis without L5 nerve root encroachment.  No significant foraminal narrowing.  L5-S1:  Calcified central protrusion.  Moderate facet arthropathy. Dorsal displacement both S1 nerve roots.  No significant foraminal narrowing.  Compared with prior CT abdomen using multiplanar reformatted images from November, a similar appearance to the lumbar spine is noted.  IMPRESSION: Multilevel  lumbosacral spondylosis as described.  Symptomatic neural compression most likely to occur at L5-S1 where there is a calcified central protrusion.  This appearance however is stable compared with November.   Original Report Authenticated By: Davonna Belling, M.D.    Ir Fluoro Guide Cv Line Right  05/29/2012  *RADIOLOGY REPORT*  Clinical Data: Lumbar osteomyelitis.  PICC LINE PLACEMENT WITH ULTRASOUND AND FLUOROSCOPIC  GUIDANCE  Fluoroscopy Time: 0.1 minutes.  The right arm was prepped with chlorhexidine, draped in the usual sterile fashion using maximum barrier technique (cap and mask, sterile gown, sterile gloves, large sterile sheet, hand hygiene and cutaneous antisepsis) and infiltrated locally with 1% Lidocaine.  Ultrasound demonstrated patency of the right basilic vein, and this was documented with an image.  Under real-time ultrasound guidance, this vein was accessed with a 21 gauge micropuncture needle and image documentation was performed.  The needle was exchanged over a guidewire for a peel-away sheath through which a 5 [place] single lumen PICC trimmed to 40 cm was advanced, positioned with its tip at the lower SVC/right atrial junction.  Fluoroscopy during the procedure and fluoro spot radiograph confirms appropriate catheter position.  The catheter was flushed, secured to the skin with Prolene sutures, and covered with a sterile dressing.  Complications:  None  IMPRESSION: Successful right arm PICC line placement with ultrasound and fluoroscopic guidance.  The catheter is ready for use.   Original Report Authenticated By: Irish Lack, M.D.    Ir US Guide Vasc Access Right  05/29/2012  *RADIOLOGY REPORT*  Clinical Data: Lumbar osteomyelitis.  PICC LINE PLACEMENT WITH ULTRASOUND AND FLUOROSCOPIC  GUIDANCE  Fluoroscopy Time: 0.1 minutes.  The right arm was prepped with chlorhexidine, draped in the usual sterile fashion using maximum barrier technique (cap and mask, sterile gown, sterile gloves, large  sterile sheet, hand hygiene and cutaneous antisepsis) and infiltrated locally with 1% Lidocaine.  Ultrasound demonstrated patency of the right basilic vein, and this was documented with an image.  Under real-time ultrasound guidance, this vein was accessed with a 21 gauge micropuncture needle and image documentation was performed.  The needle was exchanged over a guidewire for a peel-away sheath through which a 5 [place] single lumen PICC trimmed to 40 cm was advanced, positioned with its tip at the lower SVC/right atrial junction.  Fluoroscopy during the procedure and fluoro spot radiograph confirms appropriate catheter position.  The catheter was flushed, secured to the skin with Prolene sutures, and covered with a sterile dressing.  Complications:  None  IMPRESSION: Successful right arm PICC line placement with ultrasound and fluoroscopic guidance.  The catheter is ready for use.   Original Report Authenticated By: Irish Lack, M.D.          Subjective: Patient continues to have back pain. Denies fevers, chills, chest pain, shortness breath, nausea, vomiting, diarrhea, abdominal pain.  Objective: Filed Vitals:   05/30/12 1410 05/30/12 2236 05/31/12 0600 05/31/12 1451  BP: 143/77 152/82 155/80 151/80  Pulse: 92 98 88 89  Temp: 97.5 F (36.4 C) 100 F (37.8 C) 99 F (37.2 C) 98.2 F (36.8 C)  TempSrc: Oral Oral Oral Oral  Resp: 16 18 18 18   Height:      Weight:      SpO2: 100% 100% 100% 100%    Intake/Output Summary (Last 24 hours) at 05/31/12 1853 Last data filed at 05/31/12 1000  Gross per 24 hour  Intake   1130 ml  Output    900 ml  Net    230 ml   Weight change:  Exam:   General:  Pt is alert, follows commands appropriately, not in acute distress  HEENT: No icterus, No thrush,  [place]/AT  Cardiovascular: RRR, S1/S2, no rubs, no gallops  Respiratory: CTA bilaterally, no wheezing, no crackles, no rhonchi  Abdomen: Soft/+BS, non tender, non distended, no  guarding  Extremities: No edema, No lymphangitis, No petechiae, No rashes, no synovitis  Data Reviewed: Basic Metabolic Panel:  Lab 05/31/12 9147 05/30/12 0405 05/29/12 1355 05/27/12 0441 05/26/12 1255  NA 137 135 134* 131* 130*  K 3.5 3.6 3.8 4.0 4.5  CL 102 99 99 99 94*  CO2 26 26 24 21 23   GLUCOSE 76 100* 132* 181* 298*  BUN 14 13 13 17 23   CREATININE 1.03 0.97 1.01 1.07 1.10  CALCIUM 9.4 9.8 9.7 9.4 10.1  MG -- -- -- -- --  PHOS -- -- -- -- --   Liver Function Tests:  Lab 05/27/12 0441  AST 13  ALT 16  ALKPHOS 82  BILITOT 0.3  PROT 7.0  ALBUMIN 2.3*   No results found for this basename: LIPASE:5,AMYLASE:5 in the last 168 hours No results found for this basename: AMMONIA:5 in the last 168 hours CBC:  Lab 05/26/12 1058  WBC 15.3*  NEUTROABS 10.4*  HGB 11.0*  HCT 33.4*  MCV 82.3  PLT 282   Cardiac Enzymes: No results found for this basename: CKTOTAL:5,CKMB:5,CKMBINDEX:5,TROPONINI:5 in the last 168 hours BNP: No components found with this basename: POCBNP:5 CBG:  Lab 05/31/12 1142 05/31/12 0915 05/31/12 0749 05/30/12 1821 05/30/12 1110  GLUCAP 229* 166* 55* 149* 128*    No results  found for this or any previous visit (from the past 240 hour(s)).   Scheduled Meds:   . amLODipine  2.5 mg Oral q morning - 10a  . cefTRIAXone (ROCEPHIN)  IV  2 g Intravenous Q24H  . docusate sodium  100 mg Oral BID  . insulin aspart  0-15 Units Subcutaneous TID WC  . insulin glargine  50 Units Subcutaneous Daily  . insulin glargine  60 Units Subcutaneous QHS  . lisinopril  40 mg Oral q morning - 10a  . metoprolol  50 mg Oral BID  . senna  2 tablet Oral Daily  . silver nitrate applicators  1 Stick Topical Daily  . simvastatin  20 mg Oral QPM  . sodium chloride  3 mL Intravenous Q12H  . vancomycin  1,000 mg Intravenous Q12H   Continuous Infusions:   . sodium chloride 100 mL/hr at 05/31/12 1000  . lactated ringers       Johniece Hornbaker, DO  [facility] Pager  [phone]  If 7PM-7AM, please contact night-coverage [email or web address] Password [facility] 05/31/2012, 6:53 PM   LOS: 5 days

## 2012-05-31 NOTE — Progress Notes (Signed)
Subjective:     Patient reports pain as 7 on 0-10 scale.Should be ready for surgery Tuesday. I went over the procedure with him.  Temp 99  Objective: Vital signs in last 24 hours: Temp:  [97.5 F (36.4 C)-100 F (37.8 C)] 99 F (37.2 C) (01/12 0600) Pulse Rate:  [88-98] 88  (01/12 0600) Resp:  [16-18] 18  (01/12 0600) BP: (143-155)/(77-82) 155/80 mmHg (01/12 0600) SpO2:  [100 %] 100 % (01/12 0600)  Intake/Output from previous day: 01/11 0701 - 01/12 0700 In: 1031 [P.O.:600; I.V.:181; IV Piggyback:250] Out: 1551 [Urine:1550; Stool:1] Intake/Output this shift: Total I/O In: -  Out: 300 [Urine:300]  No results found for this basename: HGB:5 in the last 72 hours No results found for this basename: WBC:2,RBC:2,HCT:2,PLT:2 in the last 72 hours  Basename 05/31/12 0645 05/30/12 0405  NA 137 135  K 3.5 3.6  CL 102 99  CO2 26 26  BUN 14 13  CREATININE 1.03 0.97  GLUCOSE 76 100*  CALCIUM 9.4 9.8   No results found for this basename: LABPT:2,INR:2 in the last 72 hours  Neurologically intact  Assessment/Plan:     Up with therapy. Will try to ambulate him again. He states that he has too much pain.   Lennox Leikam A 05/31/2012, 8:47 AM

## 2012-05-31 NOTE — Progress Notes (Signed)
Pt ate late supper & nursing staff not notified when tray came, CBG taken after he had eaten. A.Constable, PA, notified & stated pt could have the appropriate coverage. Ashiya Kinkead, [employer]

## 2012-06-01 DIAGNOSIS — E785 Hyperlipidemia, unspecified: Secondary | ICD-10-CM

## 2012-06-01 LAB — GLUCOSE, CAPILLARY
Glucose-Capillary: 167 mg/dL — ABNORMAL HIGH (ref 70–99)
Glucose-Capillary: 176 mg/dL — ABNORMAL HIGH (ref 70–99)

## 2012-06-01 MED ORDER — INSULIN GLARGINE 100 UNIT/ML ~~LOC~~ SOLN
25.0000 [IU] | Freq: Every morning | SUBCUTANEOUS | Status: DC
  Administered 2012-06-02: 25 [IU] via SUBCUTANEOUS

## 2012-06-01 MED ORDER — INSULIN GLARGINE 100 UNIT/ML ~~LOC~~ SOLN
25.0000 [IU] | Freq: Every day | SUBCUTANEOUS | Status: DC
  Administered 2012-06-01: 25 [IU] via SUBCUTANEOUS

## 2012-06-01 NOTE — Progress Notes (Signed)
ANTIBIOTIC CONSULT NOTE - FOLLOW UP  Pharmacy Consult for Vancomycin Indication: Epidural abscess  Allergies  Allergen Reactions  . Aspirin Hives  . Penicillins Rash and Other (See Comments)    Immune system shut down     Patient Measurements: Height: 5' 8.9" (175 cm) (as documented 04/21/12) Weight: 231 lb 0.7 oz (104.8 kg) (as documented 04/21/12) IBW/kg (Calculated) : 70.47   Vital Signs: Temp: 99.1 F (37.3 C) (01/13 0508) Temp src: Oral (01/13 0508) BP: 121/70 mmHg (01/13 0508) Pulse Rate: 95  (01/13 0508) Intake/Output from previous day: 01/12 0701 - 01/13 0700 In: 1450 [P.O.:620; I.V.:380; IV Piggyback:450] Out: 1200 [Urine:1200] Intake/Output from this shift: Total I/O In: 480 [P.O.:480] Out: 700 [Urine:700]  Labs:  [facility] 05/31/12 0645 05/30/12 0405 05/29/12 1355  WBC -- -- --  HGB -- -- --  PLT -- -- --  LABCREA -- -- --  CREATININE 1.03 0.97 1.01   Estimated Creatinine Clearance: 81.7 ml/min (by C-G formula based on Cr of 1.03). No results found for this basename: VANCOTROUGH:2,VANCOPEAK:2,VANCORANDOM:2,GENTTROUGH:2,GENTPEAK:2,GENTRANDOM:2,TOBRATROUGH:2,TOBRAPEAK:2,TOBRARND:2,AMIKACINPEAK:2,AMIKACINTROU:2,AMIKACIN:2, in the last 72 hours    Assessment: 55 yom with h/o several vascular stents and grafts with 3 weeks of back pain. MRI showed lumbar disc herniation at L5-S1 with an epidural abscess. Pt was placed on Cipro PTA then admit 1/7 for IV antibiotics and surgery.    Today is Day#7 Vancomycin, D#5 Rocephin for osteomyelitis of vertebra of lumbosacral region and epidural abscess.  Patient is afebrile, last WBC = 15.3K on 05/26/12.  Scr stable.  Last vancomycin trough therapeutic on 1/9.    Plan for OR (debridement) tomorrow.    Goal of Therapy:  Vancomycin trough level 15-20 mcg/ml  Plan:   Continue Vancomycin at 1gm IV q12h  Pharmacy will f/u daily  Geoffry Paradise, PharmD, BCPS Pager: [phone] 1:29 PM Pharmacy #: [id number]

## 2012-06-01 NOTE — Progress Notes (Signed)
[facility] PROGRESS NOTE  Logan [name] MRN:[id number] DOB: 02/26/1946 DOA: 05/26/2012 PCP: SHAH,ASHISH, MD  Assessment/Plan: Diabetes mellitus type 2  -overall good control  -stop premeal novolog 6 units, continue sliding scale--pt refusing all novolog -Hemoglobin A1c 10.5  -Patient endorses noncompliance, has been off of insulin for about one month--patient states due to pain  -Patient has also been off metformin for about one month  -Continue current dose of Lantus and NovoLog sliding scale  -bedtime snack and stop HS novolog coverage to help with am sugar  -pt not receiving bedtime snack  Mild morning hypoglycemia -Bedtime snack would help alleviate mild morning hypoglycemia, but patient's sleep cycle and eating cycle are nonphysiologic at this time -With this regard, decreasing bedtime Lantus may help, but suspect that once patient returns to his usual habits, the am hypoglycemia will improve -give 1/2 dose of usual lantus in preparation for surgery on 06/02/12 -CBGs q 4 hours while NPO after MN Hyperlipidemia  -Patient also not compliant with Zocor at home  -Check lipid panel in the morning  -LDL 26  Hypertension  -Patient not compliant with all antihypertensives at home  -Continue current metoprolol, lisinopril, amlodipine  -Blood pressures mildly elevated due to pain  Epidural abscess  -medically stable/cleared for surgery  -Antibiotics per ID  -Awaiting debridement after 5 days off Plavix--today (05/31/12) is 5th day off  -Pain management per Dr. Gioffre  -Restart Valium 5 mg twice a day when necessary back spasms  Mild protein calorie malnutrition  -prealbumin 16.3  -glucerna with meals  Constipation  -Add Colace and Senokot  -has had BMs  Family Communication: wife at beside  Disposition Plan: Home when medically stable  Antibiotics:  Vancomycin 05/27/2012>>>  Ceftriaxone 05/28/2012>>>         Procedures/Studies: Dg Chest 1 View  05/02/2012   *RADIOLOGY REPORT*  Clinical Data: Severe back pain, diabetes, hypertension, coronary artery disease, former smoker  CHEST - 1 VIEW  Comparison: 12/20/2011  Findings: Mild elevation of right diaphragm. Upper normal heart size. Slight pulmonary vascular congestion. Mediastinal contours normal. Lungs grossly clear. No pleural effusion or pneumothorax.  IMPRESSION: No definite acute abnormalities. Mild elevation of right diaphragm.   Original Report Authenticated By: Mark Boles, M.D.    Dg Lumbar Spine 2-3 Views  05/26/2012  *RADIOLOGY REPORT*  Clinical Data: History of degenerative disc disease.  LUMBAR SPINE - 2-3 VIEW  Comparison: 05/02/2012.  Findings: There are five non-rib bearing lumbar-type vertebral bodies which are labeled L1-L5. Narrowing of intervertebral disc space at L5-S1 level.  Multilevel marginal osteophyte formation is seen in the spine.  No fracture or bony destruction is seen.  No significant subluxation is evident.  IMPRESSION: The vertebrae were numbered. Narrowing of L5-S1 intervertebral disc space. Multilevel osteophyte formation in the spine .   Original Report Authenticated By: Jaklyn Alen Call    Ir Fluoro Guide Cv Line Right  05/29/2012  *RADIOLOGY REPORT*  Clinical Data: Lumbar osteomyelitis.  PICC LINE PLACEMENT WITH ULTRASOUND AND FLUOROSCOPIC  GUIDANCE  Fluoroscopy Time: 0.1 minutes.  The right arm was prepped with chlorhexidine, draped in the usual sterile fashion using maximum barrier technique (cap and mask, sterile gown, sterile gloves, large sterile sheet, hand hygiene and cutaneous antisepsis) and infiltrated locally with 1% Lidocaine.  Ultrasound demonstrated patency of the right basilic vein, and this was documented with an image.  Under real-time ultrasound guidance, this vein was accessed with a 21 gauge micropuncture needle and image documentation was performed.  The needle was exchanged   over a guidewire for a peel-away sheath through which a 5 French single lumen PICC trimmed  to 40 cm was advanced, positioned with its tip at the lower SVC/right atrial junction.  Fluoroscopy during the procedure and fluoro spot radiograph confirms appropriate catheter position.  The catheter was flushed, secured to the skin with Prolene sutures, and covered with a sterile dressing.  Complications:  None  IMPRESSION: Successful right arm PICC line placement with ultrasound and fluoroscopic guidance.  The catheter is ready for use.   Original Report Authenticated By: Glenn Yamagata, M.D.    Ir Us Guide Vasc Access Right  05/29/2012  *RADIOLOGY REPORT*  Clinical Data: Lumbar osteomyelitis.  PICC LINE PLACEMENT WITH ULTRASOUND AND FLUOROSCOPIC  GUIDANCE  Fluoroscopy Time: 0.1 minutes.  The right arm was prepped with chlorhexidine, draped in the usual sterile fashion using maximum barrier technique (cap and mask, sterile gown, sterile gloves, large sterile sheet, hand hygiene and cutaneous antisepsis) and infiltrated locally with 1% Lidocaine.  Ultrasound demonstrated patency of the right basilic vein, and this was documented with an image.  Under real-time ultrasound guidance, this vein was accessed with a 21 gauge micropuncture needle and image documentation was performed.  The needle was exchanged over a guidewire for a peel-away sheath through which a 5 French single lumen PICC trimmed to 40 cm was advanced, positioned with its tip at the lower SVC/right atrial junction.  Fluoroscopy during the procedure and fluoro spot radiograph confirms appropriate catheter position.  The catheter was flushed, secured to the skin with Prolene sutures, and covered with a sterile dressing.  Complications:  None  IMPRESSION: Successful right arm PICC line placement with ultrasound and fluoroscopic guidance.  The catheter is ready for use.   Original Report Authenticated By: Glenn Yamagata, M.D.          Subjective: Patient denies chest pain, fevers, chills, shortness breath, nausea, vomiting, diarrhea,  abdominal pain, headaches.  Objective: Filed Vitals:   05/31/12 1451 05/31/12 2215 06/01/12 0508 06/01/12 1442  BP: 151/80 151/84 121/70 152/75  Pulse: 89 97 95 96  Temp: 98.2 F (36.8 C) 99.1 F (37.3 C) 99.1 F (37.3 C) 99.2 F (37.3 C)  TempSrc: Oral Oral Oral Oral  Resp: 18 20 20 20  Height:      Weight:      SpO2: 100% 94% 98% 98%    Intake/Output Summary (Last 24 hours) at 06/01/12 1957 Last data filed at 06/01/12 1800  Gross per 24 hour  Intake   2180 ml  Output   2150 ml  Net     30 ml   Weight change:  Exam:   General:  Pt is alert, follows commands appropriately, not in acute distress  HEENT: No icterus, No thrush,  [place]/AT  Cardiovascular: RRR, S1/S2, no rubs, no gallops  Respiratory: CTA bilaterally, no wheezing, no crackles, no rhonchi  Abdomen: Soft/+BS, non tender, non distended, no guarding  Extremities: No edema, No lymphangitis, No petechiae, No rashes, no synovitis  Data Reviewed: Basic Metabolic Panel:  Lab 05/31/12 0645 05/30/12 0405 05/29/12 1355 05/27/12 0441 05/26/12 1255  NA 137 135 134* 131* 130*  K 3.5 3.6 3.8 4.0 4.5  CL 102 99 99 99 94*  CO2 26 26 24 21 23  GLUCOSE 76 100* 132* 181* 298*  BUN 14 13 13 17 23  CREATININE 1.03 0.97 1.01 1.07 1.10  CALCIUM 9.4 9.8 9.7 9.4 10.1  MG -- -- -- -- --  PHOS -- -- -- -- --     Liver Function Tests:  Lab 05/27/12 0441  AST 13  ALT 16  ALKPHOS 82  BILITOT 0.3  PROT 7.0  ALBUMIN 2.3*   No results found for this basename: LIPASE:5,AMYLASE:5 in the last 168 hours No results found for this basename: AMMONIA:5 in the last 168 hours CBC:  Lab 05/26/12 1058  WBC 15.3*  NEUTROABS 10.4*  HGB 11.0*  HCT 33.4*  MCV 82.3  PLT 282   Cardiac Enzymes: No results found for this basename: CKTOTAL:5,CKMB:5,CKMBINDEX:5,TROPONINI:5 in the last 168 hours BNP: No components found with this basename: POCBNP:5 CBG:  Lab 06/01/12 1656 06/01/12 1149 06/01/12 0751 06/01/12 0729 05/31/12 2232    GLUCAP 131* 167* 94 56* 127*    Recent Results (from the past 240 hour(s))  SURGICAL PCR SCREEN     Status: Normal   Collection Time   05/31/12 11:24 PM      Component Value Range Status Comment   MRSA, PCR NEGATIVE  NEGATIVE Final    Staphylococcus aureus NEGATIVE  NEGATIVE Final      Scheduled Meds:   . amLODipine  2.5 mg Oral q morning - 10a  . cefTRIAXone (ROCEPHIN)  IV  2 g Intravenous Q24H  . docusate sodium  100 mg Oral BID  . insulin aspart  0-15 Units Subcutaneous TID WC  . insulin glargine  25 Units Subcutaneous QHS  . insulin glargine  25 Units Subcutaneous q morning - 10a  . lisinopril  40 mg Oral q morning - 10a  . metoprolol  50 mg Oral BID  . senna  2 tablet Oral Daily  . silver nitrate applicators  1 Stick Topical Daily  . simvastatin  20 mg Oral QPM  . sodium chloride  3 mL Intravenous Q12H  . vancomycin  1,000 mg Intravenous Q12H   Continuous Infusions:   . sodium chloride 20 mL/hr at 05/31/12 1903  . lactated ringers       Mikaelah Trostle, DO  [facility] Pager [phone]  If 7PM-7AM, please contact night-coverage [email or web address] Password [email or web address] 06/01/2012, 7:57 PM   LOS: 6 days  

## 2012-06-01 NOTE — Progress Notes (Signed)
Subjective:     Patient reports pain as 6 on 0-10 scale Case thoroughly discussed with patient and he understands what we need to do in surgery. The Plavix effect should be out of his system and he is covered with his antibiotics to preven any spred of his abscess at the time of Surgery.   Objective: Vital signs in last 24 hours: Temp:  [98.2 F (36.8 C)-99.1 F (37.3 C)] 99.1 F (37.3 C) (01/13 0508) Pulse Rate:  [89-97] 95  (01/13 0508) Resp:  [18-20] 20  (01/13 0508) BP: (121-151)/(70-84) 121/70 mmHg (01/13 0508) SpO2:  [94 %-100 %] 98 % (01/13 0508)  Intake/Output from previous day: 01/12 0701 - 01/13 0700 In: 1450 [P.O.:620; I.V.:380; IV Piggyback:450] Out: 1200 [Urine:1200] Intake/Output this shift:    No results found for this basename: HGB:5 in the last 72 hours No results found for this basename: WBC:2,RBC:2,HCT:2,PLT:2 in the last 72 hours  Basename 05/31/12 0645 05/30/12 0405  NA 137 135  K 3.5 3.6  CL 102 99  CO2 26 26  BUN 14 13  CREATININE 1.03 0.97  GLUCOSE 76 100*  CALCIUM 9.4 9.8   No results found for this basename: LABPT:2,INR:2 in the last 72 hours  Neurologically intact  Assessment/Plan:     Up with therapy.Ready for surgery tomorrow.  Yui Mulvaney A 06/01/2012, 7:20 AM

## 2012-06-01 NOTE — Progress Notes (Signed)
Noted that he is going for surgery tomorrow.  On empiric antibiotics and will await cultures prior to considering any change in therapy.    Staci Righter, MD

## 2012-06-01 NOTE — Progress Notes (Signed)
Subjective: He reports that he is still is pain but is moderately controlled with pain medication. He is continuning to sleep late and stay up late which is making him have abnormal meal times. No issues overnight. No complaints of shortness of breath or chest pain. He is prepared for surgery tomorrow.    Objective: Vital signs in last 24 hours: Temp:  [98.2 F (36.8 C)-99.1 F (37.3 C)] 99.1 F (37.3 C) (01/13 0508) Pulse Rate:  [89-97] 95  (01/13 0508) Resp:  [18-20] 20  (01/13 0508) BP: (121-151)/(70-84) 121/70 mmHg (01/13 0508) SpO2:  [94 %-100 %] 98 % (01/13 0508)  Intake/Output from previous day: 01/12 0701 - 01/13 0700 In: 1450 [P.O.:620; I.V.:380; IV Piggyback:450] Out: 1200 [Urine:1200]  Basename 05/31/12 0645 05/30/12 0405  NA 137 135  K 3.5 3.6  CL 102 99  CO2 26 26  BUN 14 13  CREATININE 1.03 0.97  GLUCOSE 76 100*  CALCIUM 9.4 9.8    Neurologically intact Neurovascular intact Dorsiflexion/Plantar flexion intact Compartment soft  Assessment/Plan: Lumbar disc herniation and lumbar disc space abscess. He will be placed NPO after midnight tonight as he is scheduled for surgery tomorrow. We did discuss the importance of normal meal times and sizes today as his blood sugar has been difficult to control since admission. We will allow minimal sips with meds tomorrow for pain control. Dr. Darrelyn Hillock has discussed the risks and benefits of the surgery with the patient.    Logan [name] Logan [name] 06/01/2012, 8:49 AM

## 2012-06-01 NOTE — Progress Notes (Signed)
Hypoglycemic Event  CBG: 56 at 729  Treatment: 15 GM carbohydrate snack  Symptoms: None  Follow-up CBG: Time: 0751 CBG Result: 94  Possible Reasons for Event: Unknown     SILK, Azha Constantin  Remember to initiate Hypoglycemia Order Set & complete

## 2012-06-01 NOTE — Progress Notes (Signed)
Inpatient Diabetes Program Recommendations  AACE/ADA: New Consensus Statement on Inpatient Glycemic Control (2013)  Target Ranges:  Prepandial:   less than 140 mg/dL      Peak postprandial:   less than 180 mg/dL (1-2 hours)      Critically ill patients:  140 - 180 mg/dL   Reason for Visit: Hypoglycemia  Results for ELLIOT, MELDRUM (MRN [id number]) as of 06/01/2012 13:26  Ref. Range 05/31/2012 07:49 05/31/2012 09:15 05/31/2012 11:42 05/31/2012 19:04 05/31/2012 22:32 06/01/2012 07:29  Glucose-Capillary Latest Range: 70-99 mg/dL 55 (L) 409 (H) 811 (H) 200 (H) 127 (H) 56 (L)   Blood sugars have been low for past 2 mornings.  Blood sugars tend to increase throughout the day and irregular eating patterns have made glycemic control variable. For surgery tomorrow morning.  Recommend:  Lower Lantus to 50 units bid. Will need meal coverage insulin when diet advanced after surgery.  Will continue to follow.  Thank you. Ailene Ards, RD, LDN, CDE

## 2012-06-02 ENCOUNTER — Inpatient Hospital Stay (HOSPITAL_COMMUNITY): Payer: Medicare Other | Admitting: Anesthesiology

## 2012-06-02 ENCOUNTER — Encounter (HOSPITAL_COMMUNITY)
Admission: AD | Disposition: A | Discharge status: Skilled Nursing Facility | Payer: Self-pay | Source: Ambulatory Visit | Attending: Orthopedic Surgery

## 2012-06-02 ENCOUNTER — Inpatient Hospital Stay (HOSPITAL_COMMUNITY): Payer: Medicare Other

## 2012-06-02 ENCOUNTER — Encounter (HOSPITAL_COMMUNITY): Payer: Self-pay | Admitting: Anesthesiology

## 2012-06-02 ENCOUNTER — Inpatient Hospital Stay (HOSPITAL_COMMUNITY): Admission: RE | Admit: 2012-06-02 | Payer: Medicare Other | Source: Ambulatory Visit | Admitting: Orthopedic Surgery

## 2012-06-02 DIAGNOSIS — M4807 Spinal stenosis, lumbosacral region: Secondary | ICD-10-CM

## 2012-06-02 DIAGNOSIS — G061 Intraspinal abscess and granuloma: Principal | ICD-10-CM | POA: Diagnosis present

## 2012-06-02 DIAGNOSIS — I2510 Atherosclerotic heart disease of native coronary artery without angina pectoris: Secondary | ICD-10-CM

## 2012-06-02 HISTORY — PX: LUMBAR LAMINECTOMY/DECOMPRESSION MICRODISCECTOMY: SHX5026

## 2012-06-02 LAB — GLUCOSE, CAPILLARY
Glucose-Capillary: 109 mg/dL — ABNORMAL HIGH (ref 70–99)
Glucose-Capillary: 43 mg/dL — CL (ref 70–99)
Glucose-Capillary: 51 mg/dL — ABNORMAL LOW (ref 70–99)
Glucose-Capillary: 85 mg/dL (ref 70–99)
Glucose-Capillary: 187 mg/dL — ABNORMAL HIGH (ref 70–99)
Glucose-Capillary: 78 mg/dL (ref 70–99)
Glucose-Capillary: 132 mg/dL — ABNORMAL HIGH (ref 70–99)

## 2012-06-02 LAB — SURGICAL PCR SCREEN: Staphylococcus aureus: NEGATIVE

## 2012-06-02 SURGERY — LUMBAR LAMINECTOMY/DECOMPRESSION MICRODISCECTOMY 1 LEVEL
Anesthesia: General | Site: Back | Wound class: Clean

## 2012-06-02 MED ORDER — METHOCARBAMOL 100 MG/ML IJ SOLN
500.0000 mg | Freq: Four times a day (QID) | INTRAVENOUS | Status: DC | PRN
  Administered 2012-06-02: 500 mg via INTRAVENOUS
  Filled 2012-06-02: qty 5

## 2012-06-02 MED ORDER — SUCCINYLCHOLINE CHLORIDE 20 MG/ML IJ SOLN
INTRAMUSCULAR | Status: DC | PRN
  Administered 2012-06-02: 100 mg via INTRAVENOUS

## 2012-06-02 MED ORDER — ONDANSETRON HCL 4 MG/2ML IJ SOLN: 4.0000 mg | INTRAMUSCULAR | Status: DC | PRN

## 2012-06-02 MED ORDER — BISACODYL 10 MG RE SUPP: 10.0000 mg | Freq: Every day | RECTAL | Status: DC | PRN

## 2012-06-02 MED ORDER — BLISTEX EX OINT
TOPICAL_OINTMENT | CUTANEOUS | Status: AC
  Filled 2012-06-02: qty 10

## 2012-06-02 MED ORDER — ACETAMINOPHEN 650 MG RE SUPP: 650.0000 mg | RECTAL | Status: DC | PRN

## 2012-06-02 MED ORDER — PHENYLEPHRINE HCL 10 MG/ML IJ SOLN
INTRAMUSCULAR | Status: DC | PRN
  Administered 2012-06-02: 80 ug via INTRAVENOUS
  Administered 2012-06-02: 40 ug via INTRAVENOUS
  Administered 2012-06-02: 80 ug via INTRAVENOUS

## 2012-06-02 MED ORDER — HYDROMORPHONE HCL PF 1 MG/ML IJ SOLN
INTRAMUSCULAR | Status: DC | PRN
  Administered 2012-06-02 (×2): 1 mg via INTRAVENOUS

## 2012-06-02 MED ORDER — DEXTROSE 50 % IV SOLN
INTRAVENOUS | Status: AC
  Administered 2012-06-02: 25 mL
  Filled 2012-06-02: qty 50

## 2012-06-02 MED ORDER — LIDOCAINE HCL (CARDIAC) 20 MG/ML IV SOLN
INTRAVENOUS | Status: DC | PRN
  Administered 2012-06-02: 100 mg via INTRAVENOUS

## 2012-06-02 MED ORDER — LACTATED RINGERS IV SOLN: INTRAVENOUS | Status: DC

## 2012-06-02 MED ORDER — ROCURONIUM BROMIDE 100 MG/10ML IV SOLN
INTRAVENOUS | Status: DC | PRN
  Administered 2012-06-02: 10 mg via INTRAVENOUS
  Administered 2012-06-02: 30 mg via INTRAVENOUS

## 2012-06-02 MED ORDER — GLYCOPYRROLATE 0.2 MG/ML IJ SOLN
INTRAMUSCULAR | Status: DC | PRN
  Administered 2012-06-02: .5 mg via INTRAVENOUS

## 2012-06-02 MED ORDER — METHOCARBAMOL 500 MG PO TABS
500.0000 mg | ORAL_TABLET | Freq: Four times a day (QID) | ORAL | Status: DC | PRN
  Administered 2012-06-03 – 2012-06-05 (×8): 500 mg via ORAL
  Filled 2012-06-02 (×9): qty 1

## 2012-06-02 MED ORDER — FENTANYL CITRATE 0.05 MG/ML IJ SOLN
INTRAMUSCULAR | Status: DC | PRN
  Administered 2012-06-02 (×4): 50 ug via INTRAVENOUS
  Administered 2012-06-02: 100 ug via INTRAVENOUS
  Administered 2012-06-02: 50 ug via INTRAVENOUS

## 2012-06-02 MED ORDER — SODIUM CHLORIDE 0.9 % IV SOLN
INTRAVENOUS | Status: DC
  Administered 2012-06-02 – 2012-06-05 (×2): via INTRAVENOUS

## 2012-06-02 MED ORDER — PROPOFOL 10 MG/ML IV BOLUS
INTRAVENOUS | Status: DC | PRN
  Administered 2012-06-02: 80 mg via INTRAVENOUS
  Administered 2012-06-02: 120 mg via INTRAVENOUS

## 2012-06-02 MED ORDER — THROMBIN 5000 UNITS EX SOLR
CUTANEOUS | Status: DC | PRN
  Administered 2012-06-02: 5000 [IU] via TOPICAL

## 2012-06-02 MED ORDER — BUPIVACAINE LIPOSOME 1.3 % IJ SUSP
20.0000 mL | Freq: Once | INTRAMUSCULAR | Status: DC
  Filled 2012-06-02: qty 20

## 2012-06-02 MED ORDER — PHENOL 1.4 % MT LIQD: 1.0000 | OROMUCOSAL | Status: DC | PRN

## 2012-06-02 MED ORDER — DEXTROSE IN LACTATED RINGERS 5 % IV SOLN
INTRAVENOUS | Status: DC | PRN
  Administered 2012-06-02: 18:00:00 via INTRAVENOUS

## 2012-06-02 MED ORDER — DEXTROSE 50 % IV SOLN
INTRAVENOUS | Status: DC | PRN
Start: 2012-06-02 — End: 2012-06-02
  Administered 2012-06-02 (×2): 12.5 g via INTRAVENOUS

## 2012-06-02 MED ORDER — SULFAMETHOXAZOLE-TMP DS 800-160 MG PO TABS: 1.0000 | ORAL_TABLET | Freq: Every day | ORAL | Status: DC

## 2012-06-02 MED ORDER — CLOPIDOGREL BISULFATE 75 MG PO TABS
75.0000 mg | ORAL_TABLET | Freq: Every day | ORAL | Status: DC
  Administered 2012-06-03 – 2012-06-06 (×4): 75 mg via ORAL
  Filled 2012-06-02 (×5): qty 1

## 2012-06-02 MED ORDER — OXYCODONE-ACETAMINOPHEN 5-325 MG PO TABS: 1.0000 | ORAL_TABLET | Freq: Four times a day (QID) | ORAL | Status: DC | PRN

## 2012-06-02 MED ORDER — PROMETHAZINE HCL 25 MG/ML IJ SOLN: 6.2500 mg | INTRAMUSCULAR | Status: DC | PRN

## 2012-06-02 MED ORDER — HYDROMORPHONE HCL PF 1 MG/ML IJ SOLN: 0.2500 mg | INTRAMUSCULAR | Status: DC | PRN

## 2012-06-02 MED ORDER — MEPERIDINE HCL 50 MG/ML IJ SOLN: 6.2500 mg | INTRAMUSCULAR | Status: DC | PRN

## 2012-06-02 MED ORDER — DEXTROSE 50 % IV SOLN: 25.0000 mL | Freq: Once | INTRAVENOUS | Status: AC | PRN

## 2012-06-02 MED ORDER — FLEET ENEMA 7-19 GM/118ML RE ENEM: 1.0000 | ENEMA | Freq: Once | RECTAL | Status: AC | PRN

## 2012-06-02 MED ORDER — NEOSTIGMINE METHYLSULFATE 1 MG/ML IJ SOLN
INTRAMUSCULAR | Status: DC | PRN
  Administered 2012-06-02: 4 mg via INTRAVENOUS

## 2012-06-02 MED ORDER — ONDANSETRON HCL 4 MG/2ML IJ SOLN
INTRAMUSCULAR | Status: DC | PRN
  Administered 2012-06-02: 4 mg via INTRAVENOUS

## 2012-06-02 MED ORDER — INSULIN ASPART 100 UNIT/ML ~~LOC~~ SOLN: 0.0000 [IU] | Freq: Three times a day (TID) | SUBCUTANEOUS | Status: DC

## 2012-06-02 MED ORDER — POLYETHYLENE GLYCOL 3350 17 G PO PACK: 17.0000 g | PACK | Freq: Every day | ORAL | Status: DC | PRN

## 2012-06-02 MED ORDER — HYDROMORPHONE HCL 2 MG PO TABS: 2.0000 mg | ORAL_TABLET | ORAL | Status: DC | PRN

## 2012-06-02 MED ORDER — MENTHOL 3 MG MT LOZG: 1.0000 | LOZENGE | OROMUCOSAL | Status: DC | PRN

## 2012-06-02 MED ORDER — DEXTROSE IN LACTATED RINGERS 5 % IV SOLN: INTRAVENOUS | Status: DC

## 2012-06-02 MED ORDER — SODIUM CHLORIDE 0.9 % IR SOLN
Status: DC | PRN
  Administered 2012-06-02: 17:00:00

## 2012-06-02 MED ORDER — ACETAMINOPHEN 325 MG PO TABS
650.0000 mg | ORAL_TABLET | ORAL | Status: DC | PRN
  Filled 2012-06-02: qty 2

## 2012-06-02 SURGICAL SUPPLY — 51 items
APL SKNCLS STERI-STRIP NONHPOA (GAUZE/BANDAGES/DRESSINGS) ×1
BAG SPEC THK2 15X12 ZIP CLS (MISCELLANEOUS) ×1
BAG ZIPLOCK 12X15 (MISCELLANEOUS) ×2 IMPLANT
BENZOIN TINCTURE PRP APPL 2/3 (GAUZE/BANDAGES/DRESSINGS) ×2 IMPLANT
CLEANER TIP ELECTROSURG 2X2 (MISCELLANEOUS) ×2 IMPLANT
CLOTH BEACON ORANGE TIMEOUT ST (SAFETY) ×2 IMPLANT
CONT SPECI 4OZ STER CLIK (MISCELLANEOUS) ×2 IMPLANT
DRAIN PENROSE 18X1/4 LTX STRL (WOUND CARE) ×1 IMPLANT
DRAPE MICROSCOPE LEICA (MISCELLANEOUS) ×2 IMPLANT
DRAPE POUCH INSTRU U-SHP 10X18 (DRAPES) ×2 IMPLANT
DRAPE SURG 17X11 SM STRL (DRAPES) ×2 IMPLANT
DRSG ADAPTIC 3X8 NADH LF (GAUZE/BANDAGES/DRESSINGS) ×2 IMPLANT
DRSG PAD ABDOMINAL 8X10 ST (GAUZE/BANDAGES/DRESSINGS) ×6 IMPLANT
DURAPREP 26ML APPLICATOR (WOUND CARE) ×2 IMPLANT
ELECT BLADE TIP CTD 4 INCH (ELECTRODE) ×1 IMPLANT
ELECT REM PT RETURN 9FT ADLT (ELECTROSURGICAL) ×2
ELECTRODE REM PT RTRN 9FT ADLT (ELECTROSURGICAL) ×1 IMPLANT
GLOVE BIOGEL PI IND STRL 7.0 (GLOVE) IMPLANT
GLOVE BIOGEL PI IND STRL 8 (GLOVE) ×2 IMPLANT
GLOVE BIOGEL PI INDICATOR 7.0 (GLOVE) ×1
GLOVE BIOGEL PI INDICATOR 8 (GLOVE) ×2
GLOVE ECLIPSE 8.0 STRL XLNG CF (GLOVE) ×5 IMPLANT
GLOVE SURG SS PI 7.0 STRL IVOR (GLOVE) ×1 IMPLANT
GOWN PREVENTION PLUS LG XLONG (DISPOSABLE) ×5 IMPLANT
GOWN STRL REIN XL XLG (GOWN DISPOSABLE) ×4 IMPLANT
KIT BASIN OR (CUSTOM PROCEDURE TRAY) ×2 IMPLANT
KIT POSITIONING SURG ANDREWS (MISCELLANEOUS) ×2 IMPLANT
MANIFOLD NEPTUNE II (INSTRUMENTS) ×2 IMPLANT
NDL SPNL 18GX3.5 QUINCKE PK (NEEDLE) ×2 IMPLANT
NEEDLE SPNL 18GX3.5 QUINCKE PK (NEEDLE) ×4 IMPLANT
NS IRRIG 1000ML POUR BTL (IV SOLUTION) ×2 IMPLANT
PATTIES SURGICAL .5 X.5 (GAUZE/BANDAGES/DRESSINGS) ×1 IMPLANT
PATTIES SURGICAL .75X.75 (GAUZE/BANDAGES/DRESSINGS) ×1 IMPLANT
PATTIES SURGICAL 1X1 (DISPOSABLE) ×1 IMPLANT
PIN SAFETY NICK PLATE  2 MED (MISCELLANEOUS)
PIN SAFETY NICK PLATE 2 MED (MISCELLANEOUS) IMPLANT
POSITIONER SURGICAL ARM (MISCELLANEOUS) ×2 IMPLANT
SPONGE GAUZE 4X4 12PLY (GAUZE/BANDAGES/DRESSINGS) ×1 IMPLANT
SPONGE LAP 4X18 X RAY DECT (DISPOSABLE) ×4 IMPLANT
SPONGE SURGIFOAM ABS GEL 100 (HEMOSTASIS) ×2 IMPLANT
STAPLER VISISTAT 35W (STAPLE) IMPLANT
SUT VIC AB 0 CT1 27 (SUTURE) ×4
SUT VIC AB 0 CT1 27XBRD ANTBC (SUTURE) ×1 IMPLANT
SUT VIC AB 1 CT1 27 (SUTURE) ×4
SUT VIC AB 1 CT1 27XBRD ANTBC (SUTURE) ×4 IMPLANT
SWAB COLLECTION DEVICE MRSA (MISCELLANEOUS) ×1 IMPLANT
TAPE CLOTH SURG 6X10 WHT LF (GAUZE/BANDAGES/DRESSINGS) ×1 IMPLANT
TOWEL OR 17X26 10 PK STRL BLUE (TOWEL DISPOSABLE) ×4 IMPLANT
TRAY LAMINECTOMY (CUSTOM PROCEDURE TRAY) ×2 IMPLANT
TUBE ANAEROBIC SPECIMEN COL (MISCELLANEOUS) ×1 IMPLANT
WATER STERILE IRR 1500ML POUR (IV SOLUTION) ×2 IMPLANT

## 2012-06-02 NOTE — Preoperative (Signed)
Beta Blockers   Reason not to administer Beta Blockers:Took Metoprolol this am. 

## 2012-06-02 NOTE — Progress Notes (Signed)
Utilization review completed.  

## 2012-06-02 NOTE — Anesthesia Preprocedure Evaluation (Addendum)
Anesthesia Evaluation  Patient identified by MRN, date of birth, ID band Patient awake    Reviewed: Allergy & Precautions, H&P , NPO status , Patient's Chart, lab work & pertinent test results, reviewed documented beta blocker date and time   Airway Mallampati: III TM Distance: >3 FB Neck ROM: full    Dental  (+) Edentulous Upper, Missing and Dental Advisory Given,    Pulmonary neg pulmonary ROS, shortness of breath and with exertion, former smoker,  breath sounds clear to auscultation  Pulmonary exam normal       Cardiovascular Exercise Tolerance: Poor hypertension, Pt. on medications and Pt. on home beta blockers + CAD, + Cardiac Stents and + Peripheral Vascular Disease Rhythm:regular Rate:Normal  aortofem bypass 2005.  Fem- fem 2006.  DES circumflex 1-/2004.  50% LAD lesion.  Cardiolite 08/2006 inf. Scar. No ischemia. Cardiomyopathy EF now 50% but used to be 35%.  Patient denies CP.   Neuro/Psych negative neurological ROS  negative psych ROS   GI/Hepatic negative GI ROS, Neg liver ROS,   Endo/Other  negative endocrine ROSdiabetes, Well Controlled, Type 2, Insulin Dependent  Renal/GU negative Renal ROS  negative genitourinary   Musculoskeletal   Abdominal   Peds  Hematology negative hematology ROS (+)   Anesthesia Other Findings   Reproductive/Obstetrics negative OB ROS                          Anesthesia Physical  Anesthesia Plan  ASA: III  Anesthesia Plan: General   Post-op Pain Management:    Induction: Intravenous  Airway Management Planned: Oral ETT  Additional Equipment:   Intra-op Plan:   Post-operative Plan:   Informed Consent: I have reviewed the patients History and Physical, chart, labs and discussed the procedure including the risks, benefits and alternatives for the proposed anesthesia with the patient or authorized representative who has indicated his/her understanding  and acceptance.   Dental Advisory Given  Plan Discussed with: CRNA and Surgeon  Anesthesia Plan Comments:         Anesthesia Quick Evaluation

## 2012-06-02 NOTE — Progress Notes (Signed)
Pt CBG at 1213 was 58, pt received Dextrose 50% and rechecked cbg 15 min later at 1249 it was 121.  Pt asymptomatic. CBG was 68 at 1510 as pt was leaving to go to the OR. Hypoglycemic protocol order was done and MD is aware as patient has had prior hypoglycemic episodes this hospital stay.

## 2012-06-02 NOTE — Progress Notes (Signed)
CRITICAL VALUE ALERT  Critical value received:  CBG 58 Date of notification:  06/02/2012  Time of notification: 1213  Critical value read back:yes  Nurse who received alert: Ernie Hew RN  MD notified (1st page):   Time of first page:   MD notified (2nd page):  Time of second page:    Time MD responded:

## 2012-06-02 NOTE — H&P (View-Only) (Signed)
[facility] PROGRESS NOTE  Logan [name] GMW:[id number] DOB: [birth year] DOA: 05/26/2012 PCP: Kirstie Peri, MD  Assessment/Plan: Diabetes mellitus type 2  -overall good control  -stop premeal novolog 6 units, continue sliding scale--pt refusing all novolog -Hemoglobin A1c 10.5  -Patient endorses noncompliance, has been off of insulin for about one month--patient states due to pain  -Patient has also been off metformin for about one month  -Continue current dose of Lantus and NovoLog sliding scale  -bedtime snack and stop HS novolog coverage to help with am sugar  -pt not receiving bedtime snack  Mild morning hypoglycemia -Bedtime snack would help alleviate mild morning hypoglycemia, but patient's sleep cycle and eating cycle are nonphysiologic at this time -With this regard, decreasing bedtime Lantus may help, but suspect that once patient returns to his usual habits, the am hypoglycemia will improve -give 1/2 dose of usual lantus in preparation for surgery on 06/02/12 -CBGs q 4 hours while NPO after MN Hyperlipidemia  -Patient also not compliant with Zocor at home  -Check lipid panel in the morning  -LDL 26  Hypertension  -Patient not compliant with all antihypertensives at home  -Continue current metoprolol, lisinopril, amlodipine  -Blood pressures mildly elevated due to pain  Epidural abscess  -medically stable/cleared for surgery  -Antibiotics per ID  -Awaiting debridement after 5 days off Plavix--today (05/31/12) is 5th day off  -Pain management per Dr. Darrelyn Hillock  -Restart Valium 5 mg twice a day when necessary back spasms  Mild protein calorie malnutrition  -prealbumin 16.3  -glucerna with meals  Constipation  -Add Colace and Senokot  -has had BMs  Family Communication: wife at beside  Disposition Plan: Home when medically stable  Antibiotics:  Vancomycin 05/27/2012>>>  Ceftriaxone 05/28/2012>>>         Procedures/Studies: Dg Chest 1 View  05/02/2012   *RADIOLOGY REPORT*  Clinical Data: Severe back pain, diabetes, hypertension, coronary artery disease, former smoker  CHEST - 1 VIEW  Comparison: 12/20/2011  Findings: Mild elevation of right diaphragm. Upper normal heart size. Slight pulmonary vascular congestion. Mediastinal contours normal. Lungs grossly clear. No pleural effusion or pneumothorax.  IMPRESSION: No definite acute abnormalities. Mild elevation of right diaphragm.   Original Report Authenticated By: Ulyses Southward, M.D.    Dg Lumbar Spine 2-3 Views  05/26/2012  *RADIOLOGY REPORT*  Clinical Data: History of degenerative disc disease.  LUMBAR SPINE - 2-3 VIEW  Comparison: 05/02/2012.  Findings: There are five non-rib bearing lumbar-type vertebral bodies which are labeled L1-L5. Narrowing of intervertebral disc space at L5-S1 level.  Multilevel marginal osteophyte formation is seen in the spine.  No fracture or bony destruction is seen.  No significant subluxation is evident.  IMPRESSION: The vertebrae were numbered. Narrowing of L5-S1 intervertebral disc space. Multilevel osteophyte formation in the spine .   Original Report Authenticated By: Onalee Hua Call    Ir Fluoro Guide Cv Line Right  05/29/2012  *RADIOLOGY REPORT*  Clinical Data: Lumbar osteomyelitis.  PICC LINE PLACEMENT WITH ULTRASOUND AND FLUOROSCOPIC  GUIDANCE  Fluoroscopy Time: 0.1 minutes.  The right arm was prepped with chlorhexidine, draped in the usual sterile fashion using maximum barrier technique (cap and mask, sterile gown, sterile gloves, large sterile sheet, hand hygiene and cutaneous antisepsis) and infiltrated locally with 1% Lidocaine.  Ultrasound demonstrated patency of the right basilic vein, and this was documented with an image.  Under real-time ultrasound guidance, this vein was accessed with a 21 gauge micropuncture needle and image documentation was performed.  The needle was exchanged  over a guidewire for a peel-away sheath through which a 5 [place] single lumen PICC trimmed  to 40 cm was advanced, positioned with its tip at the lower SVC/right atrial junction.  Fluoroscopy during the procedure and fluoro spot radiograph confirms appropriate catheter position.  The catheter was flushed, secured to the skin with Prolene sutures, and covered with a sterile dressing.  Complications:  None  IMPRESSION: Successful right arm PICC line placement with ultrasound and fluoroscopic guidance.  The catheter is ready for use.   Original Report Authenticated By: Irish Lack, M.D.    Ir US Guide Vasc Access Right  05/29/2012  *RADIOLOGY REPORT*  Clinical Data: Lumbar osteomyelitis.  PICC LINE PLACEMENT WITH ULTRASOUND AND FLUOROSCOPIC  GUIDANCE  Fluoroscopy Time: 0.1 minutes.  The right arm was prepped with chlorhexidine, draped in the usual sterile fashion using maximum barrier technique (cap and mask, sterile gown, sterile gloves, large sterile sheet, hand hygiene and cutaneous antisepsis) and infiltrated locally with 1% Lidocaine.  Ultrasound demonstrated patency of the right basilic vein, and this was documented with an image.  Under real-time ultrasound guidance, this vein was accessed with a 21 gauge micropuncture needle and image documentation was performed.  The needle was exchanged over a guidewire for a peel-away sheath through which a 5 [place] single lumen PICC trimmed to 40 cm was advanced, positioned with its tip at the lower SVC/right atrial junction.  Fluoroscopy during the procedure and fluoro spot radiograph confirms appropriate catheter position.  The catheter was flushed, secured to the skin with Prolene sutures, and covered with a sterile dressing.  Complications:  None  IMPRESSION: Successful right arm PICC line placement with ultrasound and fluoroscopic guidance.  The catheter is ready for use.   Original Report Authenticated By: Irish Lack, M.D.          Subjective: Patient denies chest pain, fevers, chills, shortness breath, nausea, vomiting, diarrhea,  abdominal pain, headaches.  Objective: Filed Vitals:   05/31/12 1451 05/31/12 2215 06/01/12 0508 06/01/12 1442  BP: 151/80 151/84 121/70 152/75  Pulse: 89 97 95 96  Temp: 98.2 F (36.8 C) 99.1 F (37.3 C) 99.1 F (37.3 C) 99.2 F (37.3 C)  TempSrc: Oral Oral Oral Oral  Resp: 18 20 20 20   Height:      Weight:      SpO2: 100% 94% 98% 98%    Intake/Output Summary (Last 24 hours) at 06/01/12 1957 Last data filed at 06/01/12 1800  Gross per 24 hour  Intake   2180 ml  Output   2150 ml  Net     30 ml   Weight change:  Exam:   General:  Pt is alert, follows commands appropriately, not in acute distress  HEENT: No icterus, No thrush,  /AT  Cardiovascular: RRR, S1/S2, no rubs, no gallops  Respiratory: CTA bilaterally, no wheezing, no crackles, no rhonchi  Abdomen: Soft/+BS, non tender, non distended, no guarding  Extremities: No edema, No lymphangitis, No petechiae, No rashes, no synovitis  Data Reviewed: Basic Metabolic Panel:  Lab 05/31/12 4098 05/30/12 0405 05/29/12 1355 05/27/12 0441 05/26/12 1255  NA 137 135 134* 131* 130*  K 3.5 3.6 3.8 4.0 4.5  CL 102 99 99 99 94*  CO2 26 26 24 21 23   GLUCOSE 76 100* 132* 181* 298*  BUN 14 13 13 17 23   CREATININE 1.03 0.97 1.01 1.07 1.10  CALCIUM 9.4 9.8 9.7 9.4 10.1  MG -- -- -- -- --  PHOS -- -- -- -- --  Liver Function Tests:  Lab 05/27/12 0441  AST 13  ALT 16  ALKPHOS 82  BILITOT 0.3  PROT 7.0  ALBUMIN 2.3*   No results found for this basename: LIPASE:5,AMYLASE:5 in the last 168 hours No results found for this basename: AMMONIA:5 in the last 168 hours CBC:  Lab 05/26/12 1058  WBC 15.3*  NEUTROABS 10.4*  HGB 11.0*  HCT 33.4*  MCV 82.3  PLT 282   Cardiac Enzymes: No results found for this basename: CKTOTAL:5,CKMB:5,CKMBINDEX:5,TROPONINI:5 in the last 168 hours BNP: No components found with this basename: POCBNP:5 CBG:  Lab 06/01/12 1656 06/01/12 1149 06/01/12 0751 06/01/12 0729 05/31/12 2232    GLUCAP 131* 167* 94 56* 127*    Recent Results (from the past 240 hour(s))  SURGICAL PCR SCREEN     Status: Normal   Collection Time   05/31/12 11:24 PM      Component Value Range Status Comment   MRSA, PCR NEGATIVE  NEGATIVE Final    Staphylococcus aureus NEGATIVE  NEGATIVE Final      Scheduled Meds:   . amLODipine  2.5 mg Oral q morning - 10a  . cefTRIAXone (ROCEPHIN)  IV  2 g Intravenous Q24H  . docusate sodium  100 mg Oral BID  . insulin aspart  0-15 Units Subcutaneous TID WC  . insulin glargine  25 Units Subcutaneous QHS  . insulin glargine  25 Units Subcutaneous q morning - 10a  . lisinopril  40 mg Oral q morning - 10a  . metoprolol  50 mg Oral BID  . senna  2 tablet Oral Daily  . silver nitrate applicators  1 Stick Topical Daily  . simvastatin  20 mg Oral QPM  . sodium chloride  3 mL Intravenous Q12H  . vancomycin  1,000 mg Intravenous Q12H   Continuous Infusions:   . sodium chloride 20 mL/hr at 05/31/12 1903  . lactated ringers       Collin Rengel, DO  [facility] Pager [phone]  If 7PM-7AM, please contact night-coverage [email or web address] Password [email or web address] 06/01/2012, 7:57 PM   LOS: 6 days

## 2012-06-02 NOTE — Progress Notes (Signed)
[facility] PROGRESS NOTE  Logan [name] ZOX:[id number] DOB: May 27, 1945 DOA: 05/26/2012 PCP: Kirstie Peri, MD  Assessment/Plan: Hypoglycemia--postoperatively -Due to patient's n.p.o. status all day -Patient  remains drowsy at this time -Still had hypoglycemia despite cutting Lantus dose in half -Discontinue Lantus altogether for now until patient is awake and able to eat again -Check CBGs every 4 hours until patient is awake and able to eat Diabetes mellitus type 2  -overall good control  -stop premeal novolog 6 units, continue sliding scale--pt refusing all novolog  -Hemoglobin A1c 10.5  -Patient endorses noncompliance, has been off of insulin for about one month--patient states due to pain  -Patient has also been off metformin for about one month  -Continue current dose of Lantus and NovoLog sliding scale  -bedtime snack and stop HS novolog coverage to help with am sugar  -pt not receiving bedtime snack  Mild morning hypoglycemia  -Bedtime snack would help alleviate mild morning hypoglycemia, but patient's sleep cycle and eating cycle are nonphysiologic at this time  -With this regard, decreasing bedtime Lantus may help, but suspect that once patient returns to his usual habits, the am hypoglycemia will improve  -give 1/2 dose of usual lantus in preparation for surgery on 06/02/12  -CBGs q 4 hours while NPO after MN  Hyperlipidemia  -Patient also not compliant with Zocor at home  -Check lipid panel in the morning  -LDL 26  Hypertension  -Patient not compliant with all antihypertensives at home  -Continue current metoprolol, lisinopril, amlodipine  -Blood pressures mildly elevated due to pain  Epidural abscess  -medically stable/cleared for surgery  -Antibiotics per ID  -Awaiting debridement after 5 days off Plavix--today (05/31/12) is 5th day off  -Pain management per Dr. Darrelyn Hillock  -Restart Valium 5 mg twice a day when necessary back spasms  Mild protein calorie malnutrition   -prealbumin 16.3  -glucerna with meals  Constipation  -Add Colace and Senokot  -has had BMs  Family Communication: wife at beside  Disposition Plan: Home when medically stable  Antibiotics:  Vancomycin 05/27/2012>>>  Ceftriaxone 05/28/2012>>>       Procedures/Studies: Dg Lumbar Spine 2-3 Views  05/26/2012  *RADIOLOGY REPORT*  Clinical Data: History of degenerative disc disease.  LUMBAR SPINE - 2-3 VIEW  Comparison: 05/02/2012.  Findings: There are five non-rib bearing lumbar-type vertebral bodies which are labeled L1-L5. Narrowing of intervertebral disc space at L5-S1 level.  Multilevel marginal osteophyte formation is seen in the spine.  No fracture or bony destruction is seen.  No significant subluxation is evident.  IMPRESSION: The vertebrae were numbered. Narrowing of L5-S1 intervertebral disc space. Multilevel osteophyte formation in the spine .   Original Report Authenticated By: Onalee Hua Call    Ir Fluoro Guide Cv Line Right  05/29/2012  *RADIOLOGY REPORT*  Clinical Data: Lumbar osteomyelitis.  PICC LINE PLACEMENT WITH ULTRASOUND AND FLUOROSCOPIC  GUIDANCE  Fluoroscopy Time: 0.1 minutes.  The right arm was prepped with chlorhexidine, draped in the usual sterile fashion using maximum barrier technique (cap and mask, sterile gown, sterile gloves, large sterile sheet, hand hygiene and cutaneous antisepsis) and infiltrated locally with 1% Lidocaine.  Ultrasound demonstrated patency of the right basilic vein, and this was documented with an image.  Under real-time ultrasound guidance, this vein was accessed with a 21 gauge micropuncture needle and image documentation was performed.  The needle was exchanged over a guidewire for a peel-away sheath through which a 5 [place] single lumen PICC trimmed to 40 cm was advanced, positioned with its  tip at the lower SVC/right atrial junction.  Fluoroscopy during the procedure and fluoro spot radiograph confirms appropriate catheter position.  The catheter  was flushed, secured to the skin with Prolene sutures, and covered with a sterile dressing.  Complications:  None  IMPRESSION: Successful right arm PICC line placement with ultrasound and fluoroscopic guidance.  The catheter is ready for use.   Original Report Authenticated By: Irish Lack, M.D.    Ir US Guide Vasc Access Right  05/29/2012  *RADIOLOGY REPORT*  Clinical Data: Lumbar osteomyelitis.  PICC LINE PLACEMENT WITH ULTRASOUND AND FLUOROSCOPIC  GUIDANCE  Fluoroscopy Time: 0.1 minutes.  The right arm was prepped with chlorhexidine, draped in the usual sterile fashion using maximum barrier technique (cap and mask, sterile gown, sterile gloves, large sterile sheet, hand hygiene and cutaneous antisepsis) and infiltrated locally with 1% Lidocaine.  Ultrasound demonstrated patency of the right basilic vein, and this was documented with an image.  Under real-time ultrasound guidance, this vein was accessed with a 21 gauge micropuncture needle and image documentation was performed.  The needle was exchanged over a guidewire for a peel-away sheath through which a 5 [place] single lumen PICC trimmed to 40 cm was advanced, positioned with its tip at the lower SVC/right atrial junction.  Fluoroscopy during the procedure and fluoro spot radiograph confirms appropriate catheter position.  The catheter was flushed, secured to the skin with Prolene sutures, and covered with a sterile dressing.  Complications:  None  IMPRESSION: Successful right arm PICC line placement with ultrasound and fluoroscopic guidance.  The catheter is ready for use.   Original Report Authenticated By: Irish Lack, M.D.    Dg Spine Portable 1 View  06/02/2012  *RADIOLOGY REPORT*  Clinical Data: Lumbar disc disease.  PORTABLE SPINE - 1 VIEW  Comparison: Radiographs dated 05/26/2012  Findings: Instruments at the L5-S1 level.  Clamp is on the L5 spinous process.  IMPRESSION: Instruments at L5-S1.   Original Report Authenticated By: Francene Boyers, M.D.    Dg Spine Portable 1 View  06/02/2012  *RADIOLOGY REPORT*  Clinical Data: Decompression L5-S1  PORTABLE SPINE - 1 VIEW  Comparison: 05/26/2012, CT 05/02/2012  Findings: There is a needle directed at the L4 spinous process.  There is a second needle directed at the L5 spinous process.  IMPRESSION: Surgical localization L4 and L5 spinous processes.   Original Report Authenticated By: Janeece Riggers, M.D.          Subjective: Patient is drowsy. No fevers today. No reports of vomiting, diarrhea, respiratory distress.  Objective: Filed Vitals:   06/02/12 1930 06/02/12 1945 06/02/12 2000 06/02/12 2013  BP: 149/71 137/63 138/62 138/77  Pulse: 101 90 89 89  Temp:   98 F (36.7 C) 98.8 F (37.1 C)  TempSrc:    Oral  Resp: 16 17 16 20   Height:      Weight:      SpO2: 100% 98% 98% 99%    Intake/Output Summary (Last 24 hours) at 06/02/12 2040 Last data filed at 06/02/12 1853  Gross per 24 hour  Intake 2008.34 ml  Output   1226 ml  Net 782.34 ml   Weight change:  Exam:   General:  Pt is not in acute distress  HEENT: No icterus,[place]/AT  Cardiovascular: RRR, S1/S2, no rubs, no gallops  Respiratory: CTA bilaterally, no wheezing, no crackles, no rhonchi  Abdomen: Soft/+BS, non distended  Data Reviewed: Basic Metabolic Panel:  Lab 05/31/12 1610 05/30/12 0405 05/29/12 1355 05/27/12 0441  NA  137 135 134* 131*  K 3.5 3.6 3.8 4.0  CL 102 99 99 99  CO2 26 26 24 21   GLUCOSE 76 100* 132* 181*  BUN 14 13 13 17   CREATININE 1.03 0.97 1.01 1.07  CALCIUM 9.4 9.8 9.7 9.4  MG -- -- -- --  PHOS -- -- -- --   Liver Function Tests:  Lab 05/27/12 0441  AST 13  ALT 16  ALKPHOS 82  BILITOT 0.3  PROT 7.0  ALBUMIN 2.3*   No results found for this basename: LIPASE:5,AMYLASE:5 in the last 168 hours No results found for this basename: AMMONIA:5 in the last 168 hours CBC: No results found for this basename: WBC:5,NEUTROABS:5,HGB:5,HCT:5,MCV:5,PLT:5 in the last 168  hours Cardiac Enzymes: No results found for this basename: CKTOTAL:5,CKMB:5,CKMBINDEX:5,TROPONINI:5 in the last 168 hours BNP: No components found with this basename: POCBNP:5 CBG:  Lab 06/02/12 2030 06/02/12 1918 06/02/12 1820 06/02/12 1748 06/02/12 1717  GLUCAP 125* 132* 187* 78 85    Recent Results (from the past 240 hour(s))  SURGICAL PCR SCREEN     Status: Normal   Collection Time   05/31/12 11:24 PM      Component Value Range Status Comment   MRSA, PCR NEGATIVE  NEGATIVE Final    Staphylococcus aureus NEGATIVE  NEGATIVE Final   SURGICAL PCR SCREEN     Status: Normal   Collection Time   06/02/12  3:15 PM      Component Value Range Status Comment   MRSA, PCR NEGATIVE  NEGATIVE Final    Staphylococcus aureus NEGATIVE  NEGATIVE Final      Scheduled Meds:   . amLODipine  2.5 mg Oral q morning - 10a  . cefTRIAXone (ROCEPHIN)  IV  2 g Intravenous Q24H  . clopidogrel  75 mg Oral Q breakfast  . insulin aspart  0-15 Units Subcutaneous TID WC  . lip balm      . lisinopril  40 mg Oral q morning - 10a  . metoprolol  50 mg Oral BID  . silver nitrate applicators  1 Stick Topical Daily  . simvastatin  20 mg Oral QPM  . sodium chloride  3 mL Intravenous Q12H  . vancomycin  1,000 mg Intravenous Q12H   Continuous Infusions:   . sodium chloride    . lactated ringers       Logan Martone, DO  [facility] Pager [phone]  If 7PM-7AM, please contact night-coverage [email or web address] Password [email or web address] 06/02/2012, 8:40 PM   LOS: 7 days

## 2012-06-02 NOTE — Interval H&P Note (Signed)
History and Physical Interval Note:  06/02/2012 3:23 PM  Logan [name]  has presented today for surgery, with the diagnosis of lumbar herniated disc  The various methods of treatment have been discussed with the patient and family. After consideration of risks, benefits and other options for treatment, the patient has consented to  Procedure(s) (LRB) with comments: LUMBAR LAMINECTOMY/DECOMPRESSION MICRODISCECTOMY 1 LEVEL (N/A) - Central Decompression Lumbar Laminectomy L5-S1  as a surgical intervention .  The patient's history has been reviewed, patient examined, no change in status, stable for surgery.  I have reviewed the patient's chart and labs.  Questions were answered to the patient's satisfaction.     Gatsby Chismar A

## 2012-06-02 NOTE — Anesthesia Postprocedure Evaluation (Signed)
  Anesthesia Post-op Note  Patient: Logan [name]  Procedure(s) Performed: Procedure(s) (LRB): LUMBAR LAMINECTOMY/DECOMPRESSION MICRODISCECTOMY 1 LEVEL (N/A)  Patient Location: PACU  Anesthesia Type: General  Level of Consciousness: awake and alert   Airway and Oxygen Therapy: Patient Spontanous Breathing  Post-op Pain: mild  Post-op Assessment: Post-op Vital signs reviewed, Patient's Cardiovascular Status Stable, Respiratory Function Stable, Patent Airway and No signs of Nausea or vomiting  Last Vitals:  Filed Vitals:   06/02/12 1900  BP: 144/69  Pulse: 97  Temp:   Resp: 20    Post-op Vital Signs: stable   Complications: No apparent anesthesia complications

## 2012-06-02 NOTE — Transfer of Care (Signed)
Immediate Anesthesia Transfer of Care Note  Patient: Logan [name]  Procedure(s) Performed: Procedure(s) (LRB) with comments: LUMBAR LAMINECTOMY/DECOMPRESSION MICRODISCECTOMY 1 LEVEL (N/A) - Central Decompression Lumbar Laminectomy L5-S1   Patient Location: PACU  Anesthesia Type:General  Level of Consciousness: awake and sedated  Airway & Oxygen Therapy: Patient Spontanous Breathing and Patient connected to face mask oxygen  Post-op Assessment: Report given to PACU RN and Post -op Vital signs reviewed and stable  Post vital signs: Reviewed and stable  Complications: No apparent anesthesia complications

## 2012-06-02 NOTE — Brief Op Note (Signed)
05/26/2012 - 06/02/2012  6:47 PM  PATIENT:  Ria Comment  67 y.o. male  PRE-OPERATIVE DIAGNOSIS:  Spinal Stenosis Lumbar,and Epidural Abscess L-5-S-1  POST-OPERATIVE DIAGNOSIS:  Same as Pre-Op.  PROCEDURE:  Procedure(s) (LRB) with comments: LUMBAR LAMINECTOMY/DECOMPRESSION MICRODISCECTOMY 1 LEVEL (N/A) - Central Decompression Lumbar Laminectomy L5-S1  for Epidural Abscess and Spinal Stenosis  SURGEON:  Surgeon(s) and Role:    * Jacki Cones, MD - Primary    * Drucilla Schmidt, MD - Assisting     ASSISTANTS: Marlowe Kays MD   ANESTHESIA:   general  EBL:  Total I/O In: 900 [I.V.:900] Out: 800 [Urine:750; Blood:50]  BLOOD ADMINISTERED:none  DRAINS: Penrose drain in the 1/4 inchLumbar space   LOCAL MEDICATIONS USED:  BUPIVICAINE 20cc.  SPECIMEN:  Source of Specimen:  L-5-S-1  DISPOSITION OF SPECIMEN:  PATHOLOGY  COUNTS:  YES  TOURNIQUET:  * No tourniquets in log *  DICTATION: .Other Dictation: Dictation Number [id number]  PLAN OF CARE: Admit to inpatient   PATIENT DISPOSITION:  PACU - hemodynamically stable.   Delay start of Pharmacological VTE agent (>24hrs) due to surgical blood loss or risk of bleeding: yes

## 2012-06-02 NOTE — Progress Notes (Signed)
Subjective:     Patient reports pain as 4 on 0-10 scale.    Objective: Vital signs in last 24 hours: Temp:  [98.5 F (36.9 C)-99.7 F (37.6 C)] 98.5 F (36.9 C) (01/14 1438) Pulse Rate:  [82-91] 82  (01/14 1438) Resp:  [16-20] 16  (01/14 1438) BP: (141-153)/(73-80) 150/79 mmHg (01/14 1438) SpO2:  [95 %-97 %] 97 % (01/14 1438)  Intake/Output from previous day: 01/13 0701 - 01/14 0700 In: 1690 [P.O.:960; I.V.:480; IV Piggyback:250] Out: 2326 [Urine:2325; Stool:1] Intake/Output this shift: Total I/O In: -  Out: 750 [Urine:750]  No results found for this basename: HGB:5 in the last 72 hours No results found for this basename: WBC:2,RBC:2,HCT:2,PLT:2 in the last 72 hours  Basename 05/31/12 0645  NA 137  K 3.5  CL 102  CO2 26  BUN 14  CREATININE 1.03  GLUCOSE 76  CALCIUM 9.4   No results found for this basename: LABPT:2,INR:2 in the last 72 hours  Neurologically intact  Assessment/Plan:     Up with therapy  Logan [name] A 06/02/2012, 3:14 PM

## 2012-06-03 ENCOUNTER — Encounter (HOSPITAL_COMMUNITY): Payer: Self-pay | Admitting: Orthopedic Surgery

## 2012-06-03 DIAGNOSIS — Z9114 Patient's other noncompliance with medication regimen: Secondary | ICD-10-CM

## 2012-06-03 DIAGNOSIS — E119 Type 2 diabetes mellitus without complications: Secondary | ICD-10-CM | POA: Diagnosis present

## 2012-06-03 DIAGNOSIS — Z91148 Patient's other noncompliance with medication regimen for other reason: Secondary | ICD-10-CM

## 2012-06-03 DIAGNOSIS — E162 Hypoglycemia, unspecified: Secondary | ICD-10-CM | POA: Diagnosis not present

## 2012-06-03 DIAGNOSIS — Z9119 Patient's noncompliance with other medical treatment and regimen: Secondary | ICD-10-CM

## 2012-06-03 LAB — GLUCOSE, CAPILLARY: Glucose-Capillary: 153 mg/dL — ABNORMAL HIGH (ref 70–99)

## 2012-06-03 MED ORDER — HYDROMORPHONE HCL 2 MG PO TABS
2.0000 mg | ORAL_TABLET | ORAL | Status: DC | PRN
  Administered 2012-06-03: 4 mg via ORAL
  Administered 2012-06-03 (×2): 2 mg via ORAL
  Administered 2012-06-03 – 2012-06-04 (×2): 4 mg via ORAL
  Administered 2012-06-04: 2 mg via ORAL
  Administered 2012-06-04 – 2012-06-05 (×6): 4 mg via ORAL
  Administered 2012-06-06: 2 mg via ORAL
  Administered 2012-06-06: 4 mg via ORAL
  Filled 2012-06-03 (×3): qty 2
  Filled 2012-06-03: qty 1
  Filled 2012-06-03 (×2): qty 2
  Filled 2012-06-03: qty 1
  Filled 2012-06-03 (×2): qty 2
  Filled 2012-06-03: qty 1
  Filled 2012-06-03 (×3): qty 2
  Filled 2012-06-03: qty 1
  Filled 2012-06-03: qty 2

## 2012-06-03 NOTE — Progress Notes (Signed)
[facility] PROGRESS NOTE  Logan [name] ZOX:[id number] DOB: January 20, 1946 DOA: 05/26/2012  PCP: Kirstie Peri, MD  Brief HPI: 67 y/o with history of PVD s/p right to left fem -fem bypass 1997/ aortabifemoral bypass 2005 right to left fem-fem 07/2004 secondary to occluded righ tlimb of aortobifemoral bypass with history of chronically infected left groin wound at surgical site listed above. Presented to the hospital with worsening back pain with no history of trauma/injury. On further work up was found to have an epidural abscess. We have been consulted for management of patient's diabetes given his history.  Patient denied any fevers, chills, nausea, emesis, hematuria, dysuria, or hypoglycemic symptoms  Past medical history:  Past Medical History  Diagnosis Date  . Overweight   . Dyslipidemia   . Cardiomyopathy   . Diabetes mellitus   . PVD (peripheral vascular disease)     right to left fem -fem bypass 1997/ aortabifemoral bypass 2005 right to left fem-fem 07/2004. Secondary to occluded righ tlimb of aortobifemoral bypass  . Tobacco abuse   . Anginal pain   . Hypertension   . Shortness of breath   . Coronary artery disease     taxis DES stent circumflex 02/2003.... residual total distal circumflex and 50% LAD/ cardiolite 08/2006 inferior scar no ischemia  . Degenerative disc disease, lumbar     Primary Service: Ortho Other Consultants: ID  Procedures: Spine surgery 1/14  Antibiotics: Vancomycin and Rocephin per ID  Subjective: Patient complains of 9/10 back pain. Denies any nausea or vomiting.  No other complaints.  Objective: Vital Signs  Filed Vitals:   06/03/12 0438 06/03/12 0910 06/03/12 0911 06/03/12 1324  BP: 170/82 155/71 155/71 136/67  Pulse: 87   88  Temp: 98.3 F (36.8 C)   101.1 F (38.4 C)  TempSrc: Oral   Oral  Resp: 16   16  Height:      Weight:      SpO2: 100%   100%    Intake/Output Summary (Last 24 hours) at 06/03/12 1328 Last data filed at  06/03/12 1300  Gross per 24 hour  Intake   2705 ml  Output   1525 ml  Net   1180 ml   Filed Weights   05/26/12 1327  Weight: 104.8 kg (231 lb 0.7 oz)    General appearance: alert, cooperative, appears stated age, no distress and moderately obese Head: Normocephalic, without obvious abnormality, atraumatic Eyes: conjunctivae/corneas clear. PERRL, EOM's intact.  Resp: clear to auscultation bilaterally Cardio: regular rate and rhythm, S1, S2 normal, no murmur, click, rub or gallop GI: soft, non-tender; bowel sounds normal; no masses,  no organomegaly Extremities: extremities normal, atraumatic, no cyanosis or edema Neurologic: Alert and oriented x 3. No focal deficits.  Lab Results:  Basic Metabolic Panel:  Lab 05/31/12 8119 05/30/12 0405 05/29/12 1355  NA 137 135 134*  K 3.5 3.6 3.8  CL 102 99 99  CO2 26 26 24   GLUCOSE 76 100* 132*  BUN 14 13 13   CREATININE 1.03 0.97 1.01  CALCIUM 9.4 9.8 9.7  MG -- -- --  PHOS -- -- --   CBG:  Lab 06/03/12 1256 06/03/12 0714 06/03/12 0408 06/03/12 0006 06/02/12 2030  GLUCAP 153* 69* 109* 73 125*    Recent Results (from the past 240 hour(s))  SURGICAL PCR SCREEN     Status: Normal   Collection Time   05/31/12 11:24 PM      Component Value Range Status Comment   MRSA, PCR NEGATIVE  NEGATIVE Final    Staphylococcus aureus NEGATIVE  NEGATIVE Final   SURGICAL PCR SCREEN     Status: Normal   Collection Time   06/02/12  3:15 PM      Component Value Range Status Comment   MRSA, PCR NEGATIVE  NEGATIVE Final    Staphylococcus aureus NEGATIVE  NEGATIVE Final   ANAEROBIC CULTURE     Status: Normal (Preliminary result)   Collection Time   06/02/12  3:39 PM      Component Value Range Status Comment   Specimen Description TISSUE   Final    Special Requests LUMBAR   Final    Gram Stain     Final    Value: NO WBC SEEN     NO ORGANISMS SEEN   Culture     Final    Value: NO ANAEROBES ISOLATED; CULTURE IN PROGRESS FOR 5 DAYS   Report Status  PENDING   Incomplete   TISSUE CULTURE     Status: Normal (Preliminary result)   Collection Time   06/02/12  3:39 PM      Component Value Range Status Comment   Specimen Description TISSUE   Final    Special Requests NONE LUMBAR   Final    Gram Stain     Final    Value: NO WBC SEEN     NO ORGANISMS SEEN   Culture PENDING   Incomplete    Report Status PENDING   Incomplete   ANAEROBIC CULTURE     Status: Normal (Preliminary result)   Collection Time   06/02/12  5:51 PM      Component Value Range Status Comment   Specimen Description WOUND LUMBAR   Final    Special Requests NONE   Final    Gram Stain     Final    Value: FEW WBC PRESENT,BOTH PMN AND MONONUCLEAR     NO SQUAMOUS EPITHELIAL CELLS SEEN     NO ORGANISMS SEEN   Culture     Final    Value: NO ANAEROBES ISOLATED; CULTURE IN PROGRESS FOR 5 DAYS   Report Status PENDING   Incomplete   WOUND CULTURE     Status: Normal (Preliminary result)   Collection Time   06/02/12  5:51 PM      Component Value Range Status Comment   Specimen Description WOUND LUMBAR   Final    Special Requests NONE   Final    Gram Stain     Final    Value: FEW WBC PRESENT,BOTH PMN AND MONONUCLEAR     NO SQUAMOUS EPITHELIAL CELLS SEEN     NO ORGANISMS SEEN   Culture NO GROWTH   Final    Report Status PENDING   Incomplete       Studies/Results: Dg Spine Portable 1 View  06/02/2012  *RADIOLOGY REPORT*  Clinical Data: Lumbar disc disease.  PORTABLE SPINE - 1 VIEW  Comparison: Radiographs dated 05/26/2012  Findings: Instruments at the L5-S1 level.  Clamp is on the L5 spinous process.  IMPRESSION: Instruments at L5-S1.   Original Report Authenticated By: Francene Boyers, M.D.    Dg Spine Portable 1 View  06/02/2012  *RADIOLOGY REPORT*  Clinical Data: Decompression L5-S1  PORTABLE SPINE - 1 VIEW  Comparison: 05/26/2012, CT 05/02/2012  Findings: There is a needle directed at the L4 spinous process.  There is a second needle directed at the L5 spinous process.   IMPRESSION: Surgical localization L4 and L5 spinous processes.   Original Report  Authenticated By: Janeece Riggers, M.D.     Medications:  Scheduled:    . amLODipine  2.5 mg Oral q morning - 10a  . cefTRIAXone (ROCEPHIN)  IV  2 g Intravenous Q24H  . clopidogrel  75 mg Oral Q breakfast  . insulin aspart  0-15 Units Subcutaneous TID WC  . lisinopril  40 mg Oral q morning - 10a  . metoprolol  50 mg Oral BID  . silver nitrate applicators  1 Stick Topical Daily  . simvastatin  20 mg Oral QPM  . sodium chloride  3 mL Intravenous Q12H  . vancomycin  1,000 mg Intravenous Q12H   Continuous:    . sodium chloride 30 mL/hr at 06/03/12 0753  . lactated ringers     YNW:GNFAOZ chloride, acetaminophen, acetaminophen, bisacodyl, diazepam, HYDROmorphone (DILAUDID) injection, HYDROmorphone, menthol-cetylpyridinium, methocarbamol (ROBAXIN) IV, methocarbamol, ondansetron (ZOFRAN) IV, ondansetron, phenol, polyethylene glycol, sodium chloride, sodium chloride  Assessment/Plan:  Principal Problem:  *Hypoglycemia Active Problems:  DYSLIPIDEMIA  HYPERTENSION  CORONARY ATHEROSCLEROSIS NATIVE CORONARY ARTERY  Osteomyelitis of vertebra of lumbosacral region  Abscess in epidural space of lumbar spine  Spinal stenosis of lumbosacral region  DM type 2 (diabetes mellitus, type 2)  Non compliance w medication regimen    Hypoglycemia Likely due to patient's n.p.o. status all day yesterday. Monitor CBG closely. Currently off of long acting insulin. Bedtime snack would help alleviate mild morning hypoglycemia.  Diabetes mellitus type 2  Hemoglobin A1c 10.5. Patient endorses noncompliance at home, had been off of insulin for about one month and also been off metformin for about one month. On SSI only for now.  Hyperlipidemia  -Patient also not compliant with Zocor at home. LDL 54  Hypertension  -Patient not compliant with all antihypertensives at home  -Continue current metoprolol, lisinopril, amlodipine    -Blood pressures mildly elevated due to pain   History of CAD with stent to Cx in 2004 Stable.  Epidural abscess  Status post surgical intervention. On antibiotics per ID. Management of pain per ortho.  Mild protein calorie malnutrition  -prealbumin 16.3  -glucerna with meals   Constipation  -Add Colace and Senokot  -has had BMs   Family Communication: wife at beside  Disposition Plan: Home when medically stable   Code Status Full Code  DVT Prophylaxis SCD's   LOS: 8 days   [facility]  [facility] Pager [phone] 06/03/2012, 1:28 PM  If 8PM-8AM, please contact night-coverage at [email or web address], password [facility]

## 2012-06-03 NOTE — Plan of Care (Signed)
Problem: Phase II Progression Outcomes Goal: Progress activity as tolerated unless otherwise ordered Outcome: Progressing Pt progressing slowly.  Able to sit EOB with A x 2.  Back spasm limited activity.

## 2012-06-03 NOTE — Evaluation (Signed)
Occupational Therapy Evaluation Patient Details Name: Logan [name] MRN: [id number] DOB: 1946/04/19 Today's Date: 06/03/2012 Time: 1478-2956 OT Time Calculation (min): 28 min  OT Assessment / Plan / Recommendation Clinical Impression  This 67 year old man was admitted for osteomyelitis of vertebra.  he had an epidural abscess and is s/p decompression of L5-S1.   Pt states he had about a 3 week h/o back pain where he stayed in bed and attempted to get up to 3:1 commode.  Pt is appropriate for skilled OT to increase independence with ADLs following back precautions.      OT Assessment  Patient needs continued OT Services    Follow Up Recommendations  SNF;CIR (vs. depending upon progress)    Barriers to Discharge      Equipment Recommendations  None recommended by OT    Recommendations for Other Services    Frequency  Min 2X/week    Precautions / Restrictions Precautions Precautions: Back Precaution Comments: discussed with pt.  He can comfortably lift bil UEs over head and doesn't get any back strain.  Educated on twisting and bending Restrictions Weight Bearing Restrictions: No   Pertinent Vitals/Pain 10/10 back most of the time:  Had muscle spasms which lasted whole time he sat up (4 minutes).  Repositioned.  Pt was premedicated but was not due for muscle relaxant    ADL  Grooming: Set up Where Assessed - Grooming: Supine, head of bed up (30 degrees for all) Upper Body Bathing: Set up Where Assessed - Upper Body Bathing: Supine, head of bed up Lower Body Bathing: +2 Total assistance Lower Body Bathing: Patient Percentage: 10% Where Assessed - Lower Body Bathing: Supine, head of bed flat;Rolling right and/or left Upper Body Dressing: Minimal assistance Where Assessed - Upper Body Dressing: Supine, head of bed up Lower Body Dressing: +2 Total assistance Lower Body Dressing: Patient Percentage: 0% Where Assessed - Lower Body Dressing: Rolling right and/or left Toileting -  Clothing Manipulation and Hygiene: +2 Total assistance Toileting - Clothing Manipulation and Hygiene: Patient Percentage: 10% Where Assessed - Toileting Clothing Manipulation and Hygiene: Rolling right and/or left Transfers/Ambulation Related to ADLs: co tx with PT.  Pt able to get to EOB and sit x 4 minutes with min guard, using bil UE's maximally to hold self up.  Had back spasm which would not let up ADL Comments: Able to perform some adls only at bed level due to muscle spasms    OT Diagnosis: Generalized weakness  OT Problem List: Decreased strength;Decreased activity tolerance;Impaired balance (sitting and/or standing);Decreased knowledge of precautions;Decreased knowledge of use of DME or AE;Pain OT Treatment Interventions: Self-care/ADL training;DME and/or AE instruction;Therapeutic activities;Patient/family education (balance to be assessed further)   OT Goals Acute Rehab OT Goals OT Goal Formulation: With patient Time For Goal Achievement: 06/10/12 Potential to Achieve Goals: Good ADL Goals Pt Will Perform Lower Body Bathing: with 2+ total assist;Sit to stand from bed;with adaptive equipment (pt 60%) ADL Goal: Lower Body Bathing - Progress: Goal set today Pt Will Perform Lower Body Dressing: with 2+ total assist;Sit to stand from bed;with adaptive equipment (pt 50%) ADL Goal: Lower Body Dressing - Progress: Goal set today Pt Will Transfer to Toilet: with 2+ total assist;Stand pivot transfer;3-in-1;Maintaining back safety precautions (pt 50%) ADL Goal: Toilet Transfer - Progress: Goal set today Miscellaneous OT Goals Miscellaneous OT Goal #1: Pt will perform bed mobility following back precautions with mod A using bed rail in preparation for adls/3:1 transfer OT Goal: Miscellaneous Goal #1 -  Progress: Goal set today Miscellaneous OT Goal #2: pt will sit eob to complete UB adls with set up/supervision and not need any cues for back precautions OT Goal: Miscellaneous Goal #2 -  Progress: Goal set today  Visit Information  Last OT Received On: 06/03/12 Assistance Needed: +2 PT/OT Co-Evaluation/Treatment: Yes    Subjective Data  Subjective: The doctor told me I have gout in the L great toe Patient Stated Goal: get rid of pain   Prior Functioning     Home Living Lives With: Spouse Available Help at Discharge: Family Type of Home: House Home Access: Stairs to enter Secretary/administrator of Steps: 1 Home Layout: One level (has basement which he doesn't have to use) Bathroom Toilet: Standard Home Adaptive Equipment: Bedside commode/3-in-1;Crutches;Straight cane;Walker - rolling Additional Comments: has been sponge bathing Prior Function Level of Independence: Needs assistance (due to pain) Comments: used bedside commode and paper on floor in case he couldn't make it Communication Communication: No difficulties         Vision/Perception     Cognition  Overall Cognitive Status: Appears within functional limits for tasks assessed/performed Arousal/Alertness: Awake/alert Orientation Level: Appears intact for tasks assessed Behavior During Session: [facility] for tasks performed    Extremity/Trunk Assessment Right Upper Extremity Assessment RUE ROM/Strength/Tone: Within functional levels (did not MMT due to precautions) Left Upper Extremity Assessment LUE ROM/Strength/Tone: Within functional levels     Mobility Bed Mobility Bed Mobility: Rolling Right Rolling Right: 3: Mod assist Right Sidelying to Sit: 1: +2 Total assist;HOB flat Right Sidelying to Sit: Patient Percentage: 30%     Shoulder Instructions     Exercise     Balance Balance Balance Assessed: Yes Static Sitting Balance Static Sitting - Balance Support: Bilateral upper extremity supported (maximal pressure exerted; pt with back spasm) Static Sitting - Level of Assistance: 5: Stand by assistance Static Sitting - Comment/# of Minutes: 4   End of Session OT - End of  Session Activity Tolerance: Patient limited by pain Patient left: in bed;with call bell/phone within reach (sidelying on R; supported)  GO     Verlin Duke 06/03/2012, 1:12 PM Marica Otter, OTR/L [phone] 06/03/2012

## 2012-06-03 NOTE — Op Note (Signed)
NAME:  LEVORN, OLESKI NO.:  [id number]  MEDICAL RECORD NO.:  [id number]  LOCATION:  PERIO                        FACILITY:  [facility]  PHYSICIAN:  Georges Lynch. Shara Hartis, M.D.DATE OF BIRTH:  1945-07-04  DATE OF PROCEDURE:  06/02/2012 DATE OF DISCHARGE:                              OPERATIVE REPORT   PREOPERATIVE DIAGNOSES: 1. Osteomyelitis involving the L5-S1 region. 2. Epidural abscess, L5-S1. 3. Questionable herniated disk, L5-S1. 4. Severe spinal stenosis at L5-S1. 5. Foraminal stenosis of L5 root and the S1 root bilaterally.  POSTOPERATIVE DIAGNOSES: 1. Osteomyelitis involving the L5-S1 region. 2. Epidural abscess, L5-S1. 3. Severe spinal stenosis at L5-S1. 4. Foraminal stenosis of L5 root and the S1 root bilaterally.  SURGEON:  Georges Lynch. Darrelyn Hillock, M.D.  ASSISTANT:  Marlowe Kays, M.D.  HISTORY:  This patient was admitted to the hospital on Tuesday. He was on chronic long-term Plavix, and he had severe back pain where he could not handle himself home, so I called Infectious Disease and I also called the hospitalist to manage his diabetes and we made sure he was stable and free of the Plavix before we do anything in his spine.  I think this was well done because at the time of surgery we had good control of the bleeding.  Also, we wanted to make sure we had adequate antibodies in his circulatory system in order to prevent any spread of the infection during our dissection.  PROCEDURE:  Under general anesthesia, routine orthopedic prep and draping of the lower back was carried out.  I did initial prep followed by a sterile prep.  The patient was on a spinal frame.  The appropriate time-out was carried out.  I did mark the back at the appropriate right side because that is basically where there was a questionable disk.  At this time, 2 needles were placed in the back for localization purposes. X-ray was taken to verify position.  Incision then was made over  the L5- S1 interspace and extended proximally and distally.  Bleeders were identified and cauterized.  At this time, self-retaining retractors were inserted.  Incision was carried out to the spinous process of L4 and L5 and the sacrum.  The muscle was separated bilaterally from the bony structures.  The McCullough retractors were inserted.  I then took another x-ray to verify our position.  I then removed the spinous process of L5 and then carried out my complete decompressive lumbar laminectomy in the usual fashion.  Cultures were taken superficially over the lamina first for aerobic and anaerobic.  There were 2 sets of cultures I would like to have it taken.  The second set was later taken deep.  At this time, the microscope was brought in, and I carried out my decompressive lumbar laminectomy.  Immediately, upon going down to the L5-S1 space, the ligamentum flavum was extremely adherent to the dura and the dura obviously showed signs of a previous epidural infection.  I gently freed the ligamentum flavum up from the dura by use of the Penfield 4 and a small pledget.  Once this was done, I was able to do a thorough decompression of the dura.  We went distally and proximally and laterally and medially.  We carried out a very careful laminectomy on the lateral recesses for the stenosis bilaterally.  We went proximally enough until we had complete freedom of the stenosis by utilizing the hockey stick.  We then went down and identified the S1 root.  We made sure that there was no severe retraction.  We first checked for the disk, but there was no herniated disk.  He had no leg pain prior to surgery.  Following that, I went down to the lateral recesses.  I went out into the foramina bilaterally to make sure there was no pus present and there was not.  Of note on the MRI, it was reported as fluid present.  He did have an adequate week of IV antibiotics which was ordered by the Infectious  Disease people.  We made sure we cultured deep as well.  Once again, we thoroughly irrigated out the area in the usual fashion with antibiotic solution.  We re-examined the recesses.  We re- examined the foramina and there was no purulent material.  We had nicely freed up the dura.  As I said, we did not stop our dissection proximally or distally until we had complete freedom of the dura to make sure there were no other stenosis noted.  We explored these 2 nerves roots, the L5 and S1 roots bilaterally.  We thoroughly irrigated the area.  I inserted the Penrose drain and loosely approximated the wound.  I left a deep distal proximal part of the fascia open for drainage purposes as well as used the Penrose drain, closed the subcu with #1 Vicryl, and skin with metal staples.  The sterile Neosporin dressing was applied.  A sterile safety pin was applied to the end of the Penrose drain to prevent any migration of the drain.  The patient was on the appropriate antibiotics prior to surgery.  Two specimens were sent to the lab.          ______________________________ Georges Lynch. Darrelyn Hillock, M.D.     RAG/MEDQ  D:  06/02/2012  T:  06/03/2012  Job:  [id number]  cc:   Quita Skye. Hart Rochester, M.D. [address] [place] [address]

## 2012-06-03 NOTE — Progress Notes (Signed)
Subjective: 1 Day Post-Op Procedure(s) (LRB): LUMBAR LAMINECTOMY/DECOMPRESSION MICRODISCECTOMY 1 LEVEL (N/A) Patient reports pain as 6 on 0-10 scale. Dressing changed and Penrose Drain Pulled. Wound looks fine.Will ambulate tomorrow.   Objective: Vital signs in last 24 hours: Temp:  [98 F (36.7 C)-101.1 F (38.4 C)] 99.1 F (37.3 C) (01/15 1540) Pulse Rate:  [82-101] 88  (01/15 1324) Resp:  [16-20] 16  (01/15 1324) BP: (136-170)/(62-99) 136/67 mmHg (01/15 1324) SpO2:  [96 %-100 %] 100 % (01/15 1324)  Intake/Output from previous day: 01/14 0701 - 01/15 0700 In: 2926.7 [I.V.:2326.7; IV Piggyback:600] Out: 1575 [Urine:1525; Blood:50] Intake/Output this shift: Total I/O In: 255 [P.O.:252; I.V.:3] Out: 1000 [Urine:1000]  No results found for this basename: HGB:5 in the last 72 hours No results found for this basename: WBC:2,RBC:2,HCT:2,PLT:2 in the last 72 hours No results found for this basename: NA:2,K:2,CL:2,CO2:2,BUN:2,CREATININE:2,GLUCOSE:2,CALCIUM:2 in the last 72 hours No results found for this basename: LABPT:2,INR:2 in the last 72 hours  Neurologically intact No cellulitis present  Assessment/Plan: 1 Day Post-Op Procedure(s) (LRB): LUMBAR LAMINECTOMY/DECOMPRESSION MICRODISCECTOMY 1 LEVEL (N/A) Up with therapy Plan on SNF Friday or Sat.  Tenzin Pavon A 06/03/2012, 4:53 PM

## 2012-06-03 NOTE — Progress Notes (Signed)
[facility]  Date of Admission:  05/26/2012  Antibiotics: Vancomycin day 9 Rocephin Day 7  Subjective: Pain in back  Objective: Temp:  [98 F (36.7 C)-99.3 F (37.4 C)] 98.3 F (36.8 C) (01/15 0438) Pulse Rate:  [82-101] 87  (01/15 0438) Resp:  [16-20] 16  (01/15 0438) BP: (136-170)/(62-99) 155/71 mmHg (01/15 0911) SpO2:  [96 %-100 %] 100 % (01/15 0438)  General: Awake, alert, mild distress with pain Skin: no rashes Lungs: CTA B Cor: RRR without mrg  Lab Results Lab Results  Component Value Date   WBC 15.3* 05/26/2012   HGB 11.0* 05/26/2012   HCT 33.4* 05/26/2012   MCV 82.3 05/26/2012   PLT 282 05/26/2012    Lab Results  Component Value Date   CREATININE 1.03 05/31/2012   BUN 14 05/31/2012   NA 137 05/31/2012   K 3.5 05/31/2012   CL 102 05/31/2012   CO2 26 05/31/2012    Lab Results  Component Value Date   ALT 16 05/27/2012   AST 13 05/27/2012   ALKPHOS 82 05/27/2012   BILITOT 0.3 05/27/2012      Microbiology: Recent Results (from the past 240 hour(s))  SURGICAL PCR SCREEN     Status: Normal   Collection Time   05/31/12 11:24 PM      Component Value Range Status Comment   MRSA, PCR NEGATIVE  NEGATIVE Final    Staphylococcus aureus NEGATIVE  NEGATIVE Final   SURGICAL PCR SCREEN     Status: Normal   Collection Time   06/02/12  3:15 PM      Component Value Range Status Comment   MRSA, PCR NEGATIVE  NEGATIVE Final    Staphylococcus aureus NEGATIVE  NEGATIVE Final   ANAEROBIC CULTURE     Status: Normal (Preliminary result)   Collection Time   06/02/12  3:39 PM      Component Value Range Status Comment   Specimen Description TISSUE   Final    Special Requests LUMBAR   Final    Gram Stain     Final    Value: NO WBC SEEN     NO ORGANISMS SEEN   Culture     Final    Value: NO ANAEROBES ISOLATED; CULTURE IN PROGRESS FOR 5 DAYS   Report Status PENDING   Incomplete   TISSUE CULTURE     Status: Normal (Preliminary result)   Collection Time   06/02/12   3:39 PM      Component Value Range Status Comment   Specimen Description TISSUE   Final    Special Requests NONE LUMBAR   Final    Gram Stain     Final    Value: NO WBC SEEN     NO ORGANISMS SEEN   Culture PENDING   Incomplete    Report Status PENDING   Incomplete   ANAEROBIC CULTURE     Status: Normal (Preliminary result)   Collection Time   06/02/12  5:51 PM      Component Value Range Status Comment   Specimen Description WOUND LUMBAR   Final    Special Requests NONE   Final    Gram Stain     Final    Value: FEW WBC PRESENT,BOTH PMN AND MONONUCLEAR     NO SQUAMOUS EPITHELIAL CELLS SEEN     NO ORGANISMS SEEN   Culture     Final    Value: NO ANAEROBES ISOLATED; CULTURE IN PROGRESS FOR 5 DAYS  Report Status PENDING   Incomplete   WOUND CULTURE     Status: Normal (Preliminary result)   Collection Time   06/02/12  5:51 PM      Component Value Range Status Comment   Specimen Description WOUND LUMBAR   Final    Special Requests NONE   Final    Gram Stain     Final    Value: FEW WBC PRESENT,BOTH PMN AND MONONUCLEAR     NO SQUAMOUS EPITHELIAL CELLS SEEN     NO ORGANISMS SEEN   Culture NO GROWTH   Final    Report Status PENDING   Incomplete     Studies/Results: Dg Spine Portable 1 View  06/02/2012  *RADIOLOGY REPORT*  Clinical Data: Lumbar disc disease.  PORTABLE SPINE - 1 VIEW  Comparison: Radiographs dated 05/26/2012  Findings: Instruments at the L5-S1 level.  Clamp is on the L5 spinous process.  IMPRESSION: Instruments at L5-S1.   Original Report Authenticated By: Francene Boyers, M.D.    Dg Spine Portable 1 View  06/02/2012  *RADIOLOGY REPORT*  Clinical Data: Decompression L5-S1  PORTABLE SPINE - 1 VIEW  Comparison: 05/26/2012, CT 05/02/2012  Findings: There is a needle directed at the L4 spinous process.  There is a second needle directed at the L5 spinous process.  IMPRESSION: Surgical localization L4 and L5 spinous processes.   Original Report Authenticated By: Janeece Riggers, M.D.      Assessment/Plan: 1)  Epidural abscess and osteomyelitis of L5-S1.  Cultures done yesterday and ngtd. On empiric vancomycin and Rocephin.  Will need a prolonged course of at least 6-8 weeks IV.  Will monitor cultures.    Staci Righter, MD [facility] [phone] pager   06/03/2012, 11:58 AM

## 2012-06-03 NOTE — Evaluation (Signed)
Physical Therapy Evaluation Patient Details Name: Logan [name] MRN: [id number] DOB: 07-May-1946 Today's Date: 06/03/2012 Time: 5784-6962 PT Time Calculation (min): 28 min  PT Assessment / Plan / Recommendation Clinical Impression  Pt. was admitted 1/07 with back pain and decreased mobility. Pt. has dx. of L5-S1 osteo/epidural abcess. Pt. is s/p Iand d on 06/03/11. Pt. appears in significant pain and spasms and only tolerated sitting on edge of bed. Pt. will benefit from PT while in acute care. Pt. may require post acute rehab at either SNF or may be CIR candidate. Will see how pt progressess.    PT Assessment  Patient needs continued PT services    Follow Up Recommendations  CIR;SNF (will need to  see how pt progresses.)    Does the patient have the potential to tolerate intense rehabilitation      Barriers to Discharge Decreased caregiver support      Equipment Recommendations  None recommended by PT    Recommendations for Other Services     Frequency Min 6X/week    Precautions / Restrictions Precautions Precautions: Back Precaution Comments: . Educated on twisting and bending    Pertinent Vitals/Pain 10 back has been premedicated.      Mobility  Bed Mobility Bed Mobility: Rolling Right;Sit to Sidelying Right Rolling Right: 3: Mod assist Right Sidelying to Sit: 1: +2 Total assist;HOB flat Right Sidelying to Sit: Patient Percentage: 30% Sit to Sidelying Right: 1: +2 Total assist;HOB elevated Sit to Sidelying Right: Patient Percentage: 30% Details for Bed Mobility Assistance: assitance to move legs over edge and to assist righting trunk to upright, pt takes extra time due to pain and spasms, pt then require 2 person to assit back to R sidelying with HOB raised to about 45 degrees, then lowered to sidelying position. Transfers Transfers: Not assessed    Shoulder Instructions     Exercises     PT Diagnosis: Difficulty walking;Generalized weakness;Acute pain  PT  Problem List: Decreased strength;Decreased range of motion;Decreased activity tolerance;Decreased balance;Decreased mobility;Decreased knowledge of use of DME;Decreased safety awareness;Obesity;Decreased knowledge of precautions PT Treatment Interventions: DME instruction;Gait training;Functional mobility training;Therapeutic activities;Therapeutic exercise;Patient/family education   PT Goals Acute Rehab PT Goals PT Goal Formulation: With patient Time For Goal Achievement: 06/17/12 Potential to Achieve Goals: Good Pt will Roll Supine to Right Side: with modified independence PT Goal: Rolling Supine to Right Side - Progress: Goal set today Pt will Roll Supine to Left Side: with modified independence PT Goal: Rolling Supine to Left Side - Progress: Goal set today Pt will go Supine/Side to Sit: with supervision PT Goal: Supine/Side to Sit - Progress: Goal set today Pt will go Sit to Supine/Side: with supervision PT Goal: Sit to Supine/Side - Progress: Goal set today Pt will go Sit to Stand: with min assist PT Goal: Sit to Stand - Progress: Goal set today Pt will go Stand to Sit: with supervision PT Goal: Stand to Sit - Progress: Goal set today Pt will Transfer Bed to Chair/Chair to Bed: with min assist PT Transfer Goal: Bed to Chair/Chair to Bed - Progress: Goal set today Pt will Ambulate: 1 - 15 feet;with +2 total assist;with rolling walker PT Goal: Ambulate - Progress: Goal set today  Visit Information  Last PT Received On: 06/03/12 Assistance Needed: +2    Subjective Data  Subjective: It hurts so bad. I cannot get up Patient Stated Goal: to get better.   Prior Functioning  Home Living Lives With: Spouse Available Help at Discharge: Family Type  of Home: House Home Access: Stairs to enter [employer] of Steps: 1 Home Layout: One level Firefighter: Standard Home Adaptive Equipment: Bedside commode/3-in-1;Straight cane;Walker - rolling Prior Function Level of  Independence: Independent with assistive device(s) Comments: used bedside commode and paper on floor in case he couldn't make , pt has been bedbound mostly recently Communication Communication: No difficulties    Cognition  Orientation Level: Appears intact for tasks assessed Behavior During Session: [facility] for tasks performed    Extremity/Trunk Assessment Right Lower Extremity Assessment RLE ROM/Strength/Tone: Deficits RLE ROM/Strength/Tone Deficits: grossly 3 hip flexion and knee extension--did not attmpt to stand RLE Sensation: WFL - Light Touch (states feet feel numbish) Left Lower Extremity Assessment LLE ROM/Strength/Tone: Deficits LLE ROM/Strength/Tone Deficits: same as R LLE Sensation: Deficits   Balance Static Sitting Balance Static Sitting - Balance Support: Bilateral upper extremity supported (maximum pressure through UE's due to pain, ) Static Sitting - Level of Assistance: 5: Stand by assistance Static Sitting - Comment/# of Minutes: 4  End of Session PT - End of Session Activity Tolerance: Patient limited by pain Patient left: in bed;with call bell/phone within reach Nurse Communication: Mobility status  GP     Rada Hay 06/03/2012, 4:43 PM  [phone]

## 2012-06-04 ENCOUNTER — Inpatient Hospital Stay (HOSPITAL_COMMUNITY): Payer: Medicare Other

## 2012-06-04 DIAGNOSIS — R509 Fever, unspecified: Secondary | ICD-10-CM | POA: Diagnosis not present

## 2012-06-04 LAB — CBC
HCT: 26.8 % — ABNORMAL LOW (ref 39.0–52.0)
MCHC: 32.5 g/dL (ref 30.0–36.0)
MCV: 83 fL (ref 78.0–100.0)
Platelets: 249 10*3/uL (ref 150–400)
RDW: 13.6 % (ref 11.5–15.5)

## 2012-06-04 LAB — BASIC METABOLIC PANEL
BUN: 10 mg/dL (ref 6–23)
BUN: 10 mg/dL (ref 6–23)
CO2: 25 mEq/L (ref 19–32)
Calcium: 9.3 mg/dL (ref 8.4–10.5)
Chloride: 98 mEq/L (ref 96–112)
Creatinine, Ser: 0.99 mg/dL (ref 0.50–1.35)
Creatinine, Ser: 0.98 mg/dL (ref 0.50–1.35)
GFR calc Af Amer: >90 mL/min (ref >90)
GFR calc non Af Amer: 82 mL/min — ABNORMAL LOW (ref >90)
Glucose, Bld: 189 mg/dL — ABNORMAL HIGH (ref 70–99)

## 2012-06-04 LAB — GLUCOSE, CAPILLARY
Glucose-Capillary: 152 mg/dL — ABNORMAL HIGH (ref 70–99)
Glucose-Capillary: 167 mg/dL — ABNORMAL HIGH (ref 70–99)
Glucose-Capillary: 155 mg/dL — ABNORMAL HIGH (ref 70–99)

## 2012-06-04 LAB — URINALYSIS, ROUTINE W REFLEX MICROSCOPIC
Glucose, UA: 500 mg/dL — AB
Leukocytes, UA: NEGATIVE
Protein, ur: NEGATIVE mg/dL
Specific Gravity, Urine: 1.014 (ref 1.005–1.030)

## 2012-06-04 NOTE — Progress Notes (Signed)
Occupational Therapy Treatment Patient Details Name: Logan [name] MRN: [id number] DOB: 07-Dec-1945 Today's Date: 06/04/2012 Time: 4098-1191 OT Time Calculation (min): 29 min  OT Assessment / Plan / Recommendation Comments on Treatment Session      Follow Up Recommendations  CIR    Barriers to Discharge       Equipment Recommendations  None recommended by OT    Recommendations for Other Services    Frequency Min 2X/week   Plan Discharge plan needs to be updated    Precautions / Restrictions Precautions Precautions: Back Restrictions Weight Bearing Restrictions: No   Pertinent Vitals/Pain 10/10 reported.  Tolerated much better than yesterday    ADL  Transfers/Ambulation Related to ADLs: sidestepped up bed with RW.  Elevated bed sit to stand with A x 2, Pt 60%.  Assist to move walker.  Pt tolerated sitting eob x 15 minutes with min guard--max cues to relax traps, which pt was making gains with.  Assisted to position on side and on back as portable xray machine came.   ADL Comments: Pt did not want to perform any adls when seated eob, used bil ues for support.  Did not have correct recliner (straight back) available  Will ask nursing staff to locate this and place in his room for next tx.  reviewed back precautions.     OT Diagnosis:    OT Problem List:   OT Treatment Interventions:     OT Goals Acute Rehab OT Goals Time For Goal Achievement: 06/10/12 ADL Goals Pt Will Transfer to Toilet: with 2+ total assist;Stand pivot transfer;3-in-1;Maintaining back safety precautions ADL Goal: Toilet Transfer - Progress: Progressing toward goals (with sit to stand portion) Miscellaneous OT Goals Miscellaneous OT Goal #1: Pt will perform bed mobility following back precautions with min A using bed rail in preparation for adls/3:1 transfer OT Goal: Miscellaneous Goal #1 - Progress: Updated due to goal met  Visit Information  Last OT Received On: 06/04/12 Assistance Needed: +2 PT/OT  Co-Evaluation/Treatment: Yes    Subjective Data      Prior Functioning       Cognition  Overall Cognitive Status: Appears within functional limits for tasks assessed/performed Arousal/Alertness: Awake/alert Orientation Level: Appears intact for tasks assessed Behavior During Session: [facility] for tasks performed    Mobility  Shoulder Instructions Bed Mobility Rolling Right: 4: Min assist;3: Mod assist (R & L with rail) Right Sidelying to Sit: 1: +2 Total assist;HOB flat Right Sidelying to Sit: Patient Percentage: 60% (used bottom rail to push up from and OT stabilized him ) Sit to Sidelying Right: 1: +2 Total assist;HOB elevated Sit to Sidelying Right: Patient Percentage: 60% Details for Bed Mobility Assistance: cues for back precautions/technique Transfers Transfers: Sit to Stand Sit to Stand: 1: +2 Total assist Sit to Stand: Patient Percentage: 60%       Exercises      Balance Static Sitting Balance Static Sitting - Balance Support: Bilateral upper extremity supported Static Sitting - Level of Assistance: 5: Stand by assistance Static Sitting - Comment/# of Minutes: 15   End of Session OT - End of Session Activity Tolerance: Patient tolerated treatment well Patient left: in bed;with call bell/phone within reach Nurse Communication:  (tx status today; need for chair)  GO     Twyla Dais 06/04/2012, 11:59 AM Marica Otter, OTR/L [phone] 06/04/2012

## 2012-06-04 NOTE — Progress Notes (Signed)
Physical Therapy Treatment Patient Details Name: Logan [name] MRN: [id number] DOB: 1945/05/31 Today's Date: 06/04/2012 Time: 7829-5621 PT Time Calculation (min): 29 min  PT Assessment / Plan / Recommendation Comments on Treatment Session  pt progressing slowly, will benefit from post acute f/u    Follow Up Recommendations  CIR;SNF     Does the patient have the potential to tolerate intense rehabilitation     Barriers to Discharge        Equipment Recommendations  None recommended by PT    Recommendations for Other Services    Frequency Min 6X/week   Plan Discharge plan remains appropriate;Frequency remains appropriate    Precautions / Restrictions Precautions Precautions: Back Precaution Comments: . Educated on twisting and bending  Restrictions Weight Bearing Restrictions: No Other Position/Activity Restrictions: pt very twisted in bed, awkward postion; continued to educate pt on precautions   Pertinent Vitals/Pain     Mobility  Bed Mobility Bed Mobility: Rolling Right;Sit to Sidelying Right Rolling Right: 4: Min assist;3: Mod assist Right Sidelying to Sit: 1: +2 Total assist;HOB flat Right Sidelying to Sit: Patient Percentage: 60% Sit to Sidelying Right: 1: +2 Total assist;HOB elevated Sit to Sidelying Right: Patient Percentage: 60% Details for Bed Mobility Assistance: cues for back precautions/technique; assist to control UB, LEs onto bed and avoid excessive twisting Transfers Transfers: Sit to Stand;Stand to Sit Sit to Stand: 1: +2 Total assist;From elevated surface Sit to Stand: Patient Percentage: 60% Stand to Sit: To bed;To elevated surface;1: +2 Total assist Stand to Sit: Patient Percentage: 60% Details for Transfer Assistance: +2 for safety, wt shift, LE support on right Ambulation/Gait Ambulation/Gait Assistance: 1: +2 Total assist Ambulation/Gait: Patient Percentage: 60% Ambulation Distance (Feet): 4 Feet (lateral steps along EOB) Assistive device:  Rolling walker Ambulation/Gait Assistance Details: increased time, +2 for safety, stability, cues for all aspects General Gait Details: unsteady, increased time; heavy use of  UEs    Exercises     PT Diagnosis:    PT Problem List:   PT Treatment Interventions:     PT Goals Acute Rehab PT Goals Time For Goal Achievement: 06/17/12 Potential to Achieve Goals: Good Pt will Roll Supine to Right Side: with modified independence PT Goal: Rolling Supine to Right Side - Progress: Progressing toward goal Pt will Roll Supine to Left Side: with modified independence PT Goal: Rolling Supine to Left Side - Progress: Progressing toward goal Pt will go Supine/Side to Sit: with supervision PT Goal: Supine/Side to Sit - Progress: Progressing toward goal Pt will go Sit to Supine/Side: with supervision PT Goal: Sit to Supine/Side - Progress: Progressing toward goal Pt will go Sit to Stand: with min assist PT Goal: Sit to Stand - Progress: Progressing toward goal Pt will go Stand to Sit: with supervision PT Goal: Stand to Sit - Progress: Progressing toward goal Pt will Ambulate: 1 - 15 feet;with +2 total assist;with rolling walker PT Goal: Ambulate - Progress: Progressing toward goal  Visit Information  Last PT Received On: 06/04/12 Assistance Needed: +2    Subjective Data  Subjective: It hurts   Cognition  Overall Cognitive Status: Appears within functional limits for tasks assessed/performed Arousal/Alertness: Awake/alert Orientation Level: Appears intact for tasks assessed Behavior During Session: [facility] for tasks performed    Balance  Static Sitting Balance Static Sitting - Balance Support: Bilateral upper extremity supported Static Sitting - Level of Assistance: 5: Stand by assistance;4: Min assist Static Sitting - Comment/# of Minutes: 15  End of Session PT - End of  Session Equipment Utilized During Treatment: Gait belt Activity Tolerance: Patient limited by pain Patient left: in  bed;with call bell/phone within reach   GP     [facility] 06/04/2012, 12:39 PM

## 2012-06-04 NOTE — Progress Notes (Signed)
Subjective: 2 Days Post-Op Procedure(s) (LRB): LUMBAR LAMINECTOMY/DECOMPRESSION MICRODISCECTOMY 1 LEVEL (N/A) Patient reports pain as 5 on 0-10 scale. No Growth thus far on his back cultures.No drainage from Left Groin. Temp.99.7 WBC is decreasing and is 11,000.Hbg 8.7.   Objective: Vital signs in last 24 hours: Temp:  [99.1 F (37.3 C)-101.1 F (38.4 C)] 99.7 F (37.6 C) (01/16 0604) Pulse Rate:  [88-111] 102  (01/16 0604) Resp:  [16] 16  (01/16 0604) BP: (135-155)/(65-72) 153/72 mmHg (01/16 0604) SpO2:  [96 %-100 %] 96 % (01/16 0604)  Intake/Output from previous day: 01/15 0701 - 01/16 0700 In: 720 [P.O.:720] Out: 3200 [Urine:3200] Intake/Output this shift:     Basename 06/04/12 0355  HGB 8.7*    Basename 06/04/12 0355  WBC 11.0*  RBC 3.23*  HCT 26.8*  PLT 249    Basename 06/04/12 0355  NA 133*  K 3.8  CL 98  CO2 25  BUN 10  CREATININE 0.99  GLUCOSE 150*  CALCIUM 9.3   No results found for this basename: LABPT:2,INR:2 in the last 72 hours  Dorsiflexion/Plantar flexion intact  Assessment/Plan: 2 Days Post-Op Procedure(s) (LRB): LUMBAR LAMINECTOMY/DECOMPRESSION MICRODISCECTOMY 1 LEVEL (N/A) Up with therapy. SNF Sat.  Orey Moure A 06/04/2012, 7:03 AM

## 2012-06-04 NOTE — Progress Notes (Signed)
[facility]  Date of Admission:  05/26/2012  Antibiotics: Vancomycin day 10 Rocephin Day 8  Subjective: Pain in back, fever  Objective: Temp:  [99.7 F (37.6 C)-100 F (37.8 C)] 100 F (37.8 C) (01/16 1344) Pulse Rate:  [102-111] 110  (01/16 1344) Resp:  [16] 16  (01/16 1344) BP: (128-169)/(65-73) 169/72 mmHg (01/16 1344) SpO2:  [95 %-96 %] 95 % (01/16 1344)  General: Awake, alert, mild distress with pain Skin: no rashes Lungs: CTA B Cor: RRR without mrg  Lab Results Lab Results  Component Value Date   WBC 11.0* 06/04/2012   HGB 8.7* 06/04/2012   HCT 26.8* 06/04/2012   MCV 83.0 06/04/2012   PLT 249 06/04/2012    Lab Results  Component Value Date   CREATININE 0.98 06/04/2012   BUN 10 06/04/2012   NA 133* 06/04/2012   K 3.9 06/04/2012   CL 98 06/04/2012   CO2 25 06/04/2012    Lab Results  Component Value Date   ALT 16 05/27/2012   AST 13 05/27/2012   ALKPHOS 82 05/27/2012   BILITOT 0.3 05/27/2012      Microbiology: Recent Results (from the past 240 hour(s))  SURGICAL PCR SCREEN     Status: Normal   Collection Time   05/31/12 11:24 PM      Component Value Range Status Comment   MRSA, PCR NEGATIVE  NEGATIVE Final    Staphylococcus aureus NEGATIVE  NEGATIVE Final   SURGICAL PCR SCREEN     Status: Normal   Collection Time   06/02/12  3:15 PM      Component Value Range Status Comment   MRSA, PCR NEGATIVE  NEGATIVE Final    Staphylococcus aureus NEGATIVE  NEGATIVE Final   ANAEROBIC CULTURE     Status: Normal (Preliminary result)   Collection Time   06/02/12  3:39 PM      Component Value Range Status Comment   Specimen Description TISSUE   Final    Special Requests LUMBAR   Final    Gram Stain     Final    Value: NO WBC SEEN     NO ORGANISMS SEEN   Culture     Final    Value: NO ANAEROBES ISOLATED; CULTURE IN PROGRESS FOR 5 DAYS   Report Status PENDING   Incomplete   TISSUE CULTURE     Status: Normal (Preliminary result)   Collection Time     06/02/12  3:39 PM      Component Value Range Status Comment   Specimen Description TISSUE   Final    Special Requests NONE LUMBAR   Final    Gram Stain     Final    Value: NO WBC SEEN     NO ORGANISMS SEEN   Culture NO GROWTH 1 DAY   Final    Report Status PENDING   Incomplete   ANAEROBIC CULTURE     Status: Normal (Preliminary result)   Collection Time   06/02/12  5:51 PM      Component Value Range Status Comment   Specimen Description WOUND LUMBAR   Final    Special Requests NONE   Final    Gram Stain     Final    Value: FEW WBC PRESENT,BOTH PMN AND MONONUCLEAR     NO SQUAMOUS EPITHELIAL CELLS SEEN     NO ORGANISMS SEEN   Culture     Final    Value: NO ANAEROBES ISOLATED; CULTURE  IN PROGRESS FOR 5 DAYS   Report Status PENDING   Incomplete   WOUND CULTURE     Status: Normal (Preliminary result)   Collection Time   06/02/12  5:51 PM      Component Value Range Status Comment   Specimen Description WOUND LUMBAR   Final    Special Requests NONE   Final    Gram Stain     Final    Value: FEW WBC PRESENT,BOTH PMN AND MONONUCLEAR     NO SQUAMOUS EPITHELIAL CELLS SEEN     NO ORGANISMS SEEN   Culture NO GROWTH 1 DAY   Final    Report Status PENDING   Incomplete     Studies/Results: Dg Chest Port 1 View  06/04/2012  *RADIOLOGY REPORT*  Clinical Data: Cough, fever  PORTABLE CHEST - 1 VIEW  Comparison: Portable exam 1125 hours compared to 05/02/2012  Findings: Right arm PICC line tip projects over SVC. Normal heart size, mediastinal contours and pulmonary vascularity for technique and positioning. Persistent elevation of right diaphragm. Lungs clear. No pleural effusion or pneumothorax.  IMPRESSION: No acute abnormalities.   Original Report Authenticated By: Ulyses Southward, M.D.    Dg Spine Portable 1 View  06/02/2012  *RADIOLOGY REPORT*  Clinical Data: Lumbar disc disease.  PORTABLE SPINE - 1 VIEW  Comparison: Radiographs dated 05/26/2012  Findings: Instruments at the L5-S1 level.  Clamp is  on the L5 spinous process.  IMPRESSION: Instruments at L5-S1.   Original Report Authenticated By: Francene Boyers, M.D.    Dg Spine Portable 1 View  06/02/2012  *RADIOLOGY REPORT*  Clinical Data: Decompression L5-S1  PORTABLE SPINE - 1 VIEW  Comparison: 05/26/2012, CT 05/02/2012  Findings: There is a needle directed at the L4 spinous process.  There is a second needle directed at the L5 spinous process.  IMPRESSION: Surgical localization L4 and L5 spinous processes.   Original Report Authenticated By: Janeece Riggers, M.D.     Assessment/Plan: 1)  Epidural abscess and osteomyelitis of L5-S1.  Cultures done and ngtd. On empiric vancomycin and Rocephin.  Will need a prolonged course of at least 6-8 weeks IV.  Will monitor cultures.    2) fever - new onset.  CXR noted and no opacity.  Otherwise stable.  Continue to observe.    Staci Righter, MD [facility] [phone] pager   06/04/2012, 3:42 PM

## 2012-06-04 NOTE — Progress Notes (Signed)
Subjective: 2 Days Post-Op Procedure(s) (LRB): LUMBAR LAMINECTOMY/DECOMPRESSION MICRODISCECTOMY 1 LEVEL (N/A) Patient reports pain as 4 on 0-10 scale.  Finally able to ambulate with PT.  Objective: Vital signs in last 24 hours: Temp:  [99.7 F (37.6 C)-100 F (37.8 C)] 100 F (37.8 C) (01/16 1344) Pulse Rate:  [102-111] 110  (01/16 1344) Resp:  [16] 16  (01/16 1344) BP: (128-169)/(65-73) 169/72 mmHg (01/16 1344) SpO2:  [95 %-96 %] 95 % (01/16 1344)  Intake/Output from previous day: 01/15 0701 - 01/16 0700 In: 720 [P.O.:720] Out: 3200 [Urine:3200] Intake/Output this shift:     Basename 06/04/12 0355  HGB 8.7*    Basename 06/04/12 0355  WBC 11.0*  RBC 3.23*  HCT 26.8*  PLT 249    Basename 06/04/12 1218 06/04/12 0355  NA 133* 133*  K 3.9 3.8  CL 98 98  CO2 25 25  BUN 10 10  CREATININE 0.98 0.99  GLUCOSE 189* 150*  CALCIUM 9.6 9.3   No results found for this basename: LABPT:2,INR:2 in the last 72 hours  Neurologically intact  Assessment/Plan: 2 Days Post-Op Procedure(s) (LRB): LUMBAR LAMINECTOMY/DECOMPRESSION MICRODISCECTOMY 1 LEVEL (N/A) Up with therapy. DC to SNF Saturday.  Logan [name] A 06/04/2012, 9:18 PM

## 2012-06-04 NOTE — Progress Notes (Addendum)
[facility] PROGRESS NOTE  Logan [name] WUX:[id number] DOB: [birth year] DOA: 05/26/2012  PCP: Kirstie Peri, MD  Brief HPI: 67 y/o with history of PVD s/p right to left fem -fem bypass 1997/ aortabifemoral bypass 2005 right to left fem-fem 07/2004 secondary to occluded righ tlimb of aortobifemoral bypass with history of chronically infected left groin wound at surgical site listed above. Presented to the hospital with worsening back pain with no history of trauma/injury. On further work up was found to have an epidural abscess. Medicine was consulted for management of patient's diabetes given his history. Patient denied any fevers, chills, nausea, emesis, hematuria, dysuria, or hypoglycemic symptoms.  Past medical history:  Past Medical History  Diagnosis Date  . Overweight   . Dyslipidemia   . Cardiomyopathy   . Diabetes mellitus   . PVD (peripheral vascular disease)     right to left fem -fem bypass 1997/ aortabifemoral bypass 2005 right to left fem-fem 07/2004. Secondary to occluded righ tlimb of aortobifemoral bypass  . Tobacco abuse   . Anginal pain   . Hypertension   . Shortness of breath   . Coronary artery disease     taxis DES stent circumflex 02/2003.... residual total distal circumflex and 50% LAD/ cardiolite 08/2006 inferior scar no ischemia  . Degenerative disc disease, lumbar     Primary Service: Ortho Other Consultants: ID  Procedures: Spine surgery 1/14  Antibiotics: Vancomycin and Rocephin per ID  Subjective: Patient complains of 9/10 back pain. Complains of a cough with white expectoration. Denies any nausea or vomiting.  No other complaints.  Objective: Vital Signs  Filed Vitals:   06/03/12 1540 06/03/12 2122 06/04/12 0604 06/04/12 0952  BP:  135/65 153/72 128/73  Pulse:  111 102   Temp: 99.1 F (37.3 C) 99.9 F (37.7 C) 99.7 F (37.6 C)   TempSrc: Oral Oral Oral   Resp:  16 16   Height:      Weight:      SpO2:  96% 96%     Intake/Output  Summary (Last 24 hours) at 06/04/12 1024 Last data filed at 06/04/12 0800  Gross per 24 hour  Intake    480 ml  Output   2775 ml  Net  -2295 ml   Filed Weights   05/26/12 1327  Weight: 104.8 kg (231 lb 0.7 oz)    General appearance: alert, cooperative, appears stated age, no distress and moderately obese Head: Normocephalic, without obvious abnormality, atraumatic Resp: Rhonchi right lung. No wheezing. Cardio: regular rate and rhythm, S1, S2 normal, no murmur, click, rub or gallop GI: soft, non-tender; bowel sounds normal; no masses,  no organomegaly Extremities: extremities normal, atraumatic, no cyanosis or edema Neurologic: Alert and oriented x 3. No focal deficits.  Lab Results:  Basic Metabolic Panel:  Lab 06/04/12 2536 05/31/12 0645 05/30/12 0405 05/29/12 1355  NA 133* 137 135 134*  K 3.8 3.5 3.6 3.8  CL 98 102 99 99  CO2 25 26 26 24   GLUCOSE 150* 76 100* 132*  BUN 10 14 13 13   CREATININE 0.99 1.03 0.97 1.01  CALCIUM 9.3 9.4 9.8 9.7  MG -- -- -- --  PHOS -- -- -- --   CBG:  Lab 06/04/12 0738 06/03/12 2122 06/03/12 1627 06/03/12 1256 06/03/12 0714  GLUCAP 152* 96 195* 153* 69*    Recent Results (from the past 240 hour(s))  SURGICAL PCR SCREEN     Status: Normal   Collection Time   05/31/12 11:24 PM  Component Value Range Status Comment   MRSA, PCR NEGATIVE  NEGATIVE Final    Staphylococcus aureus NEGATIVE  NEGATIVE Final   SURGICAL PCR SCREEN     Status: Normal   Collection Time   06/02/12  3:15 PM      Component Value Range Status Comment   MRSA, PCR NEGATIVE  NEGATIVE Final    Staphylococcus aureus NEGATIVE  NEGATIVE Final   ANAEROBIC CULTURE     Status: Normal (Preliminary result)   Collection Time   06/02/12  3:39 PM      Component Value Range Status Comment   Specimen Description TISSUE   Final    Special Requests LUMBAR   Final    Gram Stain     Final    Value: NO WBC SEEN     NO ORGANISMS SEEN   Culture     Final    Value: NO ANAEROBES  ISOLATED; CULTURE IN PROGRESS FOR 5 DAYS   Report Status PENDING   Incomplete   TISSUE CULTURE     Status: Normal (Preliminary result)   Collection Time   06/02/12  3:39 PM      Component Value Range Status Comment   Specimen Description TISSUE   Final    Special Requests NONE LUMBAR   Final    Gram Stain     Final    Value: NO WBC SEEN     NO ORGANISMS SEEN   Culture NO GROWTH 1 DAY   Final    Report Status PENDING   Incomplete   ANAEROBIC CULTURE     Status: Normal (Preliminary result)   Collection Time   06/02/12  5:51 PM      Component Value Range Status Comment   Specimen Description WOUND LUMBAR   Final    Special Requests NONE   Final    Gram Stain     Final    Value: FEW WBC PRESENT,BOTH PMN AND MONONUCLEAR     NO SQUAMOUS EPITHELIAL CELLS SEEN     NO ORGANISMS SEEN   Culture     Final    Value: NO ANAEROBES ISOLATED; CULTURE IN PROGRESS FOR 5 DAYS   Report Status PENDING   Incomplete   WOUND CULTURE     Status: Normal (Preliminary result)   Collection Time   06/02/12  5:51 PM      Component Value Range Status Comment   Specimen Description WOUND LUMBAR   Final    Special Requests NONE   Final    Gram Stain     Final    Value: FEW WBC PRESENT,BOTH PMN AND MONONUCLEAR     NO SQUAMOUS EPITHELIAL CELLS SEEN     NO ORGANISMS SEEN   Culture NO GROWTH 1 DAY   Final    Report Status PENDING   Incomplete       Studies/Results: Dg Spine Portable 1 View  06/02/2012  *RADIOLOGY REPORT*  Clinical Data: Lumbar disc disease.  PORTABLE SPINE - 1 VIEW  Comparison: Radiographs dated 05/26/2012  Findings: Instruments at the L5-S1 level.  Clamp is on the L5 spinous process.  IMPRESSION: Instruments at L5-S1.   Original Report Authenticated By: Francene Boyers, M.D.    Dg Spine Portable 1 View  06/02/2012  *RADIOLOGY REPORT*  Clinical Data: Decompression L5-S1  PORTABLE SPINE - 1 VIEW  Comparison: 05/26/2012, CT 05/02/2012  Findings: There is a needle directed at the L4 spinous process.   There is a second needle directed at the  L5 spinous process.  IMPRESSION: Surgical localization L4 and L5 spinous processes.   Original Report Authenticated By: Janeece Riggers, M.D.     Medications:  Scheduled:    . amLODipine  2.5 mg Oral q morning - 10a  . cefTRIAXone (ROCEPHIN)  IV  2 g Intravenous Q24H  . clopidogrel  75 mg Oral Q breakfast  . insulin aspart  0-15 Units Subcutaneous TID WC  . lisinopril  40 mg Oral q morning - 10a  . metoprolol  50 mg Oral BID  . simvastatin  20 mg Oral QPM  . sodium chloride  3 mL Intravenous Q12H  . vancomycin  1,000 mg Intravenous Q12H   Continuous:    . sodium chloride 30 mL/hr at 06/03/12 0753  . lactated ringers     NFA:OZHYQM chloride, acetaminophen, acetaminophen, bisacodyl, diazepam, HYDROmorphone (DILAUDID) injection, HYDROmorphone, menthol-cetylpyridinium, methocarbamol (ROBAXIN) IV, methocarbamol, ondansetron (ZOFRAN) IV, ondansetron, phenol, polyethylene glycol, sodium chloride, sodium chloride  Assessment/Plan:  Principal Problem:  *Fever Active Problems:  DYSLIPIDEMIA  HYPERTENSION  CORONARY ATHEROSCLEROSIS NATIVE CORONARY ARTERY  Osteomyelitis of vertebra of lumbosacral region  Abscess in epidural space of lumbar spine  Spinal stenosis of lumbosacral region  DM type 2 (diabetes mellitus, type 2)  Non compliance w medication regimen  Hypoglycemia   Fever Etiology unclear but patient has a cough. Will get CXR. Also check UA. Not hypoxic. ?Secondary to Epidural abscess.   Hypoglycemia Likely due to patient's n.p.o. status all day yesterday. Monitor CBG closely. Currently off of long acting insulin. Bedtime snack would help alleviate mild morning hypoglycemia. CBG appears to be stabilizing.  Diabetes mellitus type 2  Hemoglobin A1c 10.5. Patient endorses noncompliance at home, had been off of insulin for about one month and also been off metformin for about one month. On SSI only for now.  Hyperlipidemia  Patient also  not compliant with Zocor at home. LDL 54  Hypertension  -Patient not compliant with all antihypertensives at home  -Continue current metoprolol, lisinopril, amlodipine  -Blood pressures elevated at times due to pain   History of CAD with stent to Cx in 2004 Stable.  Epidural abscess  Status post surgical intervention. On antibiotics per ID. Management of pain per ortho.  Mild protein calorie malnutrition  -prealbumin 16.3  -glucerna with meals   Constipation  -Add Colace and Senokot  -has had BMs   Family Communication: Discussed with patient Disposition Plan: Plan is for SNF when medically ready.   Code Status Full Code  DVT Prophylaxis SCD's   LOS: 9 days   [facility]  [facility] Pager [phone] 06/04/2012, 10:24 AM  If 8PM-8AM, please contact night-coverage at [email or web address], password [email or web address]  UA and CXR reviewed. No obvious source of infection other than the epidural abscess for which he underwent the recent surgery. Continue to monitor. ID is also following. May need repeat cultures if he continues to spike.  Logan [name] 2:45 PM

## 2012-06-04 NOTE — Progress Notes (Signed)
Clinical Social Work Department BRIEF PSYCHOSOCIAL ASSESSMENT 06/04/2012  Patient:  Logan [name], Logan [name]     Account Number:  [id number]     Admit date:  05/26/2012  Clinical Social Worker:  Candie Chroman  Date/Time:  06/04/2012 07:41 AM  Referred by:  Physician  Date Referred:  06/03/2012 Referred for  SNF Placement   Other Referral:   Interview type:  Patient Other interview type:    PSYCHOSOCIAL DATA Living Status:  WIFE Admitted from facility:   Level of care:   Primary support name:  Pricilla Holm Primary support relationship to patient:  SPOUSE Degree of support available:   supportive    CURRENT CONCERNS Current Concerns  Post-Acute Placement   Other Concerns:    SOCIAL WORK ASSESSMENT / PLAN Pt is a 67 yr old gentleman living at home prior to hospitalization. CSW met with pt / spouse on 1/15 to assist with d/c planning. MD / PT ( vs CIR )recommend ST SNF placement following hospitalization.CSW will meet again today with pt to review recommendations and assist with SNF placement if pt is agreeable.   Assessment/plan status:  Psychosocial Support/Ongoing Assessment of Needs Other assessment/ plan:   Information/referral to community resources:   SNF list will be provided if pt is willing to accept placement.    PATIENT'S/FAMILY'S RESPONSE TO PLAN OF CARE: Pt is willing to review recommendations with CSW.     Cori Razor LCSW [phone]

## 2012-06-04 NOTE — Progress Notes (Signed)
ANTIBIOTIC CONSULT NOTE - FOLLOW UP  Pharmacy Consult for Vancomycin Indication: Epidural abscess  Allergies  Allergen Reactions  . Aspirin Hives  . Penicillins Rash and Other (See Comments)    Immune system shut down     Patient Measurements: Height: 5' 8.9" (175 cm) (as documented 04/21/12) Weight: 231 lb 0.7 oz (104.8 kg) (as documented 04/21/12) IBW/kg (Calculated) : 70.47   Vital Signs: Temp: 99.7 F (37.6 C) (01/16 0604) Temp src: Oral (01/16 0604) BP: 128/73 mmHg (01/16 0952) Pulse Rate: 102  (01/16 0604) Intake/Output from previous day: 01/15 0701 - 01/16 0700 In: 720 [P.O.:720] Out: 3200 [Urine:3200] Intake/Output from this shift: Total I/O In: -  Out: 575 [Urine:575]  Labs:  [facility] 06/04/12 1218 06/04/12 0355  WBC -- 11.0*  HGB -- 8.7*  PLT -- 249  LABCREA -- --  CREATININE 0.98 0.99   Estimated Creatinine Clearance: 85.9 ml/min (by C-G formula based on Cr of 0.98).  Basename 06/04/12 1218  VANCOTROUGH 15.1  VANCOPEAK --  VANCORANDOM --  GENTTROUGH --  GENTPEAK --  GENTRANDOM --  TOBRATROUGH --  TOBRAPEAK --  TOBRARND --  AMIKACINPEAK --  AMIKACINTROU --  AMIKACIN --      Assessment: 24 yom with h/o several vascular stents and grafts with 3 weeks of back pain. MRI showed lumbar disc herniation at L5-S1 with an epidural abscess. Pt was placed on Cipro PTA then admit 1/7 for IV antibiotics and surgery.    S/p lumbar laminectomy and microdiscectomy 1/14   PTA >> Cipro >> 1/7 1/7 >> Vanc >> 1/9 >> Rocephin(MD) >>   Tmax: 99.7 WBCs: 15.3K (last cbc 1/7) Renal: SCr = 0.98 (stable), CG 82, N 72  1/14 MRSA PCR: neg 1/14 would lumbar:NGTD 1/14 tissue lumbar: NGTD  Dose changes/drug level info:  1/9 VT 16, continue 1g q12h 1/16 VT 15.1 continue 1gm Q12h   Goal of Therapy:  Vancomycin trough level 15-20 mcg/ml  Plan:   Vancomycin trough within goal, Continue Vancomycin at 1gm IV q12h  Check vancomycin trough daily and if change in  SCr then check sooner  Juliette Alcide, PharmD, BCPS.   Pager: [phone] 06/04/2012 1:20 PM

## 2012-06-05 LAB — GLUCOSE, CAPILLARY
Glucose-Capillary: 230 mg/dL — ABNORMAL HIGH (ref 70–99)
Glucose-Capillary: 162 mg/dL — ABNORMAL HIGH (ref 70–99)
Glucose-Capillary: 201 mg/dL — ABNORMAL HIGH (ref 70–99)

## 2012-06-05 LAB — CBC
MCH: 27.6 pg (ref 26.0–34.0)
MCV: 82.1 fL (ref 78.0–100.0)
Platelets: 258 10*3/uL (ref 150–400)
RDW: 13.7 % (ref 11.5–15.5)
WBC: 11 10*3/uL — ABNORMAL HIGH (ref 4.0–10.5)

## 2012-06-05 LAB — BASIC METABOLIC PANEL
Calcium: 9.2 mg/dL (ref 8.4–10.5)
Chloride: 100 mEq/L (ref 96–112)
Creatinine, Ser: 0.95 mg/dL (ref 0.50–1.35)
GFR calc Af Amer: >90 mL/min (ref >90)

## 2012-06-05 LAB — TISSUE CULTURE: Culture: NO GROWTH

## 2012-06-05 LAB — WOUND CULTURE: Culture: NO GROWTH

## 2012-06-05 MED ORDER — DEXTROSE 5 % IV SOLN: 2.0000 g | INTRAVENOUS | Status: DC

## 2012-06-05 MED ORDER — HYDROMORPHONE HCL 2 MG PO TABS: 2.0000 mg | ORAL_TABLET | ORAL | Status: DC | PRN

## 2012-06-05 MED ORDER — METHOCARBAMOL 500 MG PO TABS: 500.0000 mg | ORAL_TABLET | Freq: Four times a day (QID) | ORAL | Status: DC | PRN

## 2012-06-05 MED ORDER — POLYETHYLENE GLYCOL 3350 17 G PO PACK: 17.0000 g | PACK | Freq: Every day | ORAL | Status: DC | PRN

## 2012-06-05 MED ORDER — VANCOMYCIN HCL IN DEXTROSE 1-5 GM/200ML-% IV SOLN: 1000.0000 mg | Freq: Two times a day (BID) | INTRAVENOUS | Status: DC

## 2012-06-05 MED ORDER — INSULIN ASPART 100 UNIT/ML ~~LOC~~ SOLN: SUBCUTANEOUS | Status: DC

## 2012-06-05 MED ORDER — POTASSIUM CHLORIDE CRYS ER 20 MEQ PO TBCR
40.0000 meq | EXTENDED_RELEASE_TABLET | Freq: Once | ORAL | Status: AC
  Administered 2012-06-05: 40 meq via ORAL
  Filled 2012-06-05: qty 2

## 2012-06-05 NOTE — Progress Notes (Signed)
ANTIBIOTIC CONSULT NOTE  Pharmacy Consult for Vancomycin Indication: Epidural abscess  Allergies  Allergen Reactions  . Aspirin Hives  . Penicillins Rash and Other (See Comments)    Immune system shut down     Patient Measurements: Height: 5' 8.9" (175 cm) (as documented 04/21/12) Weight: 231 lb 0.7 oz (104.8 kg) (as documented 04/21/12) IBW/kg (Calculated) : 70.47   Vital Signs: Temp: 99.5 F (37.5 C) (01/17 0511) Temp src: Oral (01/17 0511) BP: 117/69 mmHg (01/17 0940) Pulse Rate: 103  (01/17 0511) Intake/Output from previous day: 01/16 0701 - 01/17 0700 In: 1110 [P.O.:960; I.V.:150] Out: 2425 [Urine:2425] Intake/Output from this shift: Total I/O In: -  Out: 425 [Urine:425]  Labs:  [facility] 06/05/12 0500 06/04/12 1218 06/04/12 0355  WBC 11.0* -- 11.0*  HGB 8.8* -- 8.7*  PLT 258 -- 249  LABCREA -- -- --  CREATININE 0.95 0.98 0.99   Estimated Creatinine Clearance: 88.6 ml/min (by C-G formula based on Cr of 0.95).  Basename 06/04/12 1218  VANCOTROUGH 15.1  VANCOPEAK --  VANCORANDOM --  GENTTROUGH --  GENTPEAK --  GENTRANDOM --  TOBRATROUGH --  TOBRAPEAK --  TOBRARND --  AMIKACINPEAK --  AMIKACINTROU --  AMIKACIN --      Assessment:  67 yo M with epidural abscess and osteomyelitis of L5-S1.  Now on D#11 vancomycin 1000 mg IV q12h / D#9 ceftriaxone 2 grams IV q24h  S/P lumbar laminectomy and microdiscectomy 1/14   Cultures from OR: no growth to date  Vancomycin trough therapeutic on 1/9 and 1/16  Planning at least 6 - 8 weeks IV antibiotics per ID physician   Goal of Therapy:  Vancomycin trough level 15-20 mcg/ml Eradication of infection  Recommend:   Continue Vancomycin at 1000 mg IV q12h (and ceftriaxone at dosage ordered by ID physician)  Check serum creatinine at least twice a week (next on Monday 06/08/12)  Check vancomycin trough at least weekly (next on Thursday 06/11/12), or sooner if serum creatinine increases.   Elie Goody,  PharmD, BCPS Pager: [phone] 06/05/2012  10:44 AM

## 2012-06-05 NOTE — Progress Notes (Signed)
Blood sugar check with 223 as result. Pt stated he just completed in breakfast and would like nurse to not treat the current result. Pt agreed to have his blood sugar checked again in an hour.

## 2012-06-05 NOTE — Progress Notes (Signed)
[facility] PROGRESS NOTE  Logan [name] ZOX:[id number] DOB: 01/29/1946 DOA: 05/26/2012  PCP: Kirstie Peri, MD  Brief HPI: 67 y/o with history of PVD s/p right to left fem -fem bypass 1997/ aortabifemoral bypass 2005 right to left fem-fem 07/2004 secondary to occluded righ tlimb of aortobifemoral bypass with history of chronically infected left groin wound at surgical site listed above. Presented to the hospital with worsening back pain with no history of trauma/injury. On further work up was found to have an epidural abscess. Medicine was consulted for management of patient's diabetes given his history. Patient denied any fevers, chills, nausea, emesis, hematuria, dysuria, or hypoglycemic symptoms.  Past medical history:  Past Medical History  Diagnosis Date  . Overweight   . Dyslipidemia   . Cardiomyopathy   . Diabetes mellitus   . PVD (peripheral vascular disease)     right to left fem -fem bypass 1997/ aortabifemoral bypass 2005 right to left fem-fem 07/2004. Secondary to occluded righ tlimb of aortobifemoral bypass  . Tobacco abuse   . Anginal pain   . Hypertension   . Shortness of breath   . Coronary artery disease     taxis DES stent circumflex 02/2003.... residual total distal circumflex and 50% LAD/ cardiolite 08/2006 inferior scar no ischemia  . Degenerative disc disease, lumbar     Primary Service: Ortho Other Consultants: ID  Procedures: Spine surgery 1/14  Antibiotics: Vancomycin and Rocephin per ID  Subjective: Patient continues to have back pain. Denies any other complaints currently.   Objective: Vital Signs  Filed Vitals:   06/04/12 1344 06/04/12 2205 06/05/12 0511 06/05/12 0940  BP: 169/72 119/69 138/74 117/69  Pulse: 110 124 103   Temp: 100 F (37.8 C) 99.4 F (37.4 C) 99.5 F (37.5 C)   TempSrc: Axillary Oral Oral   Resp: 16 16 14    Height:      Weight:      SpO2: 95% 96% 96%     Intake/Output Summary (Last 24 hours) at 06/05/12 0954 Last  data filed at 06/05/12 8119  Gross per 24 hour  Intake    870 ml  Output   2575 ml  Net  -1705 ml   Filed Weights   05/26/12 1327  Weight: 104.8 kg (231 lb 0.7 oz)    General appearance: alert, cooperative, appears stated age, no distress and moderately obese Head: Normocephalic, without obvious abnormality, atraumatic Resp: Clear to auscultation.  Cardio: regular rate and rhythm, S1, S2 normal, no murmur, click, rub or gallop GI: soft, non-tender; bowel sounds normal; no masses,  no organomegaly Extremities: extremities normal, atraumatic, no cyanosis or edema Neurologic: Alert and oriented x 3. No focal deficits.  Lab Results:  Basic Metabolic Panel:  Lab 06/05/12 1478 06/04/12 1218 06/04/12 0355 05/31/12 0645 05/30/12 0405  NA 135 133* 133* 137 135  K 3.4* 3.9 3.8 3.5 3.6  CL 100 98 98 102 99  CO2 26 25 25 26 26   GLUCOSE 196* 189* 150* 76 100*  BUN 8 10 10 14 13   CREATININE 0.95 0.98 0.99 1.03 0.97  CALCIUM 9.2 9.6 9.3 9.4 9.8  MG -- -- -- -- --  PHOS -- -- -- -- --   CBG:  Lab 06/05/12 0728 06/04/12 1718 06/04/12 1230 06/04/12 0738 06/03/12 2122  GLUCAP 159* 155* 167* 152* 96    Recent Results (from the past 240 hour(s))  SURGICAL PCR SCREEN     Status: Normal   Collection Time   05/31/12 11:24 PM  Component Value Range Status Comment   MRSA, PCR NEGATIVE  NEGATIVE Final    Staphylococcus aureus NEGATIVE  NEGATIVE Final   SURGICAL PCR SCREEN     Status: Normal   Collection Time   06/02/12  3:15 PM      Component Value Range Status Comment   MRSA, PCR NEGATIVE  NEGATIVE Final    Staphylococcus aureus NEGATIVE  NEGATIVE Final   ANAEROBIC CULTURE     Status: Normal (Preliminary result)   Collection Time   06/02/12  3:39 PM      Component Value Range Status Comment   Specimen Description TISSUE   Final    Special Requests LUMBAR   Final    Gram Stain     Final    Value: NO WBC SEEN     NO ORGANISMS SEEN   Culture     Final    Value: NO ANAEROBES  ISOLATED; CULTURE IN PROGRESS FOR 5 DAYS   Report Status PENDING   Incomplete   TISSUE CULTURE     Status: Normal (Preliminary result)   Collection Time   06/02/12  3:39 PM      Component Value Range Status Comment   Specimen Description TISSUE   Final    Special Requests NONE LUMBAR   Final    Gram Stain     Final    Value: NO WBC SEEN     NO ORGANISMS SEEN   Culture NO GROWTH 1 DAY   Final    Report Status PENDING   Incomplete   ANAEROBIC CULTURE     Status: Normal (Preliminary result)   Collection Time   06/02/12  5:51 PM      Component Value Range Status Comment   Specimen Description WOUND LUMBAR   Final    Special Requests NONE   Final    Gram Stain     Final    Value: FEW WBC PRESENT,BOTH PMN AND MONONUCLEAR     NO SQUAMOUS EPITHELIAL CELLS SEEN     NO ORGANISMS SEEN   Culture     Final    Value: NO ANAEROBES ISOLATED; CULTURE IN PROGRESS FOR 5 DAYS   Report Status PENDING   Incomplete   WOUND CULTURE     Status: Normal   Collection Time   06/02/12  5:51 PM      Component Value Range Status Comment   Specimen Description WOUND LUMBAR   Final    Special Requests NONE   Final    Gram Stain     Final    Value: FEW WBC PRESENT,BOTH PMN AND MONONUCLEAR     NO SQUAMOUS EPITHELIAL CELLS SEEN     NO ORGANISMS SEEN   Culture NO GROWTH 2 DAYS   Final    Report Status 06/05/2012 FINAL   Final       Studies/Results: Dg Chest Port 1 View  06/04/2012  *RADIOLOGY REPORT*  Clinical Data: Cough, fever  PORTABLE CHEST - 1 VIEW  Comparison: Portable exam 1125 hours compared to 05/02/2012  Findings: Right arm PICC line tip projects over SVC. Normal heart size, mediastinal contours and pulmonary vascularity for technique and positioning. Persistent elevation of right diaphragm. Lungs clear. No pleural effusion or pneumothorax.  IMPRESSION: No acute abnormalities.   Original Report Authenticated By: Ulyses Southward, M.D.     Medications:  Scheduled:    . amLODipine  2.5 mg Oral q morning -  10a  . cefTRIAXone (ROCEPHIN)  IV  2 g  Intravenous Q24H  . clopidogrel  75 mg Oral Q breakfast  . insulin aspart  0-15 Units Subcutaneous TID WC  . lisinopril  40 mg Oral q morning - 10a  . metoprolol  50 mg Oral BID  . simvastatin  20 mg Oral QPM  . sodium chloride  3 mL Intravenous Q12H  . vancomycin  1,000 mg Intravenous Q12H   Continuous:    . sodium chloride 50 mL/hr at 06/05/12 0817  . lactated ringers     ZOX:WRUEAV chloride, acetaminophen, acetaminophen, bisacodyl, diazepam, HYDROmorphone (DILAUDID) injection, HYDROmorphone, menthol-cetylpyridinium, methocarbamol (ROBAXIN) IV, methocarbamol, ondansetron (ZOFRAN) IV, ondansetron, phenol, polyethylene glycol, sodium chloride, sodium chloride  Assessment/Plan:  Principal Problem:  *Fever Active Problems:  DYSLIPIDEMIA  HYPERTENSION  CORONARY ATHEROSCLEROSIS NATIVE CORONARY ARTERY  Osteomyelitis of vertebra of lumbosacral region  Abscess in epidural space of lumbar spine  Spinal stenosis of lumbosacral region  DM type 2 (diabetes mellitus, type 2)  Non compliance w medication regimen  Hypoglycemia   Fever Likely secondary to epidural abscess. No other source found. Continue to monitor. No changes to treatment plan currently.   Diabetes mellitus type 2 with episodes of Hypoglycemia Hypoglycemia appears to have resolved. Will leave him off of Lantus for now. Can resume Metformin at discharge along with SSI coverage. Have modified discharge instruction. Bedtime snack would help alleviate mild morning hypoglycemia. SNF to monitor CBG's 4 times daily for now. If CBG started staying higher than 180 consistently, please initiate low dose Lantus (e.g 5 units) and titrate by 2-4 units as needed. Will recommend stopping Glimepiride. Hemoglobin A1c 10.5. Patient endorses noncompliance at home, had been off of insulin for about one month and also been off metformin for about one month.   Hyperlipidemia  Patient also not compliant  with Zocor at home. LDL 54. May resume at discharge.  Hypertension  -Patient not compliant with all antihypertensives at home  -Continue current metoprolol, lisinopril, amlodipine  -Blood pressures elevated at times due to pain  Continue meds at discharge.  History of CAD with stent to Cx in 2004 Stable.  Epidural abscess  Status post surgical intervention. On antibiotics per ID. Management of pain per ortho.  Mild protein calorie malnutrition  -prealbumin 16.3  -glucerna with meals   Constipation  -Add Colace and Senokot  -has had BMs   Family Communication: Discussed with patient Disposition Plan: Plan is for SNF 1/18  Code Status Full Code  DVT Prophylaxis SCD's   LOS: 10 days   [facility]  [facility] Pager [phone] 06/05/2012, 9:54 AM  If 8PM-8AM, please contact night-coverage at [email or web address], password [email or web address]

## 2012-06-05 NOTE — Progress Notes (Signed)
   Subjective: 3 Days Post-Op Procedure(s) (LRB): LUMBAR LAMINECTOMY/DECOMPRESSION MICRODISCECTOMY 1 LEVEL (N/A) Patient reports pain as mild.   Patient seen in rounds without Dr. Darrelyn Hillock. Patient is well, and has had no acute complaints or problems. He is continuing to have some back in the lower back but is feeling better than before surgery. No issues with shortness of breath or chest pain. He continues to be somewhat hesitant to get up out of bed with therapy.   Objective: Vital signs in last 24 hours: Temp:  [99.4 F (37.4 C)-100 F (37.8 C)] 99.5 F (37.5 C) (01/17 0511) Pulse Rate:  [103-124] 103  (01/17 0511) Resp:  [14-16] 14  (01/17 0511) BP: (119-169)/(69-74) 138/74 mmHg (01/17 0511) SpO2:  [95 %-96 %] 96 % (01/17 0511)  Intake/Output from previous day:  Intake/Output Summary (Last 24 hours) at 06/05/12 0717 Last data filed at 06/05/12 0511  Gross per 24 hour  Intake   1110 ml  Output   2425 ml  Net  -1315 ml     Labs:  Basename 06/05/12 0500 06/04/12 0355  HGB 8.8* 8.7*    Basename 06/05/12 0500 06/04/12 0355  WBC 11.0* 11.0*  RBC 3.19* 3.23*  HCT 26.2* 26.8*  PLT 258 249    Basename 06/05/12 0500 06/04/12 1218  NA 135 133*  K 3.4* 3.9  CL 100 98  CO2 26 25  BUN 8 10  CREATININE 0.95 0.98  GLUCOSE 196* 189*  CALCIUM 9.2 9.6    EXAM General - Patient is Alert and Oriented Extremity - Neurologically intact Neurovascular intact Dorsiflexion/Plantar flexion intact No cellulitis present Motor Function - intact, moving foot and toes well on exam.   Past Medical History  Diagnosis Date  . Overweight   . Dyslipidemia   . Cardiomyopathy   . Diabetes mellitus   . PVD (peripheral vascular disease)     right to left fem -fem bypass 1997/ aortabifemoral bypass 2005 right to left fem-fem 07/2004. Secondary to occluded righ tlimb of aortobifemoral bypass  . Tobacco abuse   . Anginal pain   . Hypertension   . Shortness of breath   . Coronary artery  disease     taxis DES stent circumflex 02/2003.... residual total distal circumflex and 50% LAD/ cardiolite 08/2006 inferior scar no ischemia  . Degenerative disc disease, lumbar     Assessment/Plan: 3 Days Post-Op Procedure(s) (LRB): LUMBAR LAMINECTOMY/DECOMPRESSION MICRODISCECTOMY 1 LEVEL (N/A) Principal Problem:  *Fever Active Problems:  DYSLIPIDEMIA  HYPERTENSION  CORONARY ATHEROSCLEROSIS NATIVE CORONARY ARTERY  Osteomyelitis of vertebra of lumbosacral region  Abscess in epidural space of lumbar spine  Spinal stenosis of lumbosacral region  DM type 2 (diabetes mellitus, type 2)  Non compliance w medication regimen  Hypoglycemia  Estimated Body mass index is 34.22 kg/(m^2) as calculated from the following:   Height as of this encounter: 5' 8.898"[as documented 04/21/12[(1.75 m).   Weight as of this encounter: 231 lb 0.7 oz(104.8 kg). Advance diet Up with therapy Discharge to SNF tomorrow Weight-Bearing as tolerated   We will continue to have him work with therapy today. Dr. Darrelyn Hillock will discuss with ID about best antibiotic coverage. Hopeful to send to SNF tomorrow.   Logan [name] 06/05/2012, 7:17 AM

## 2012-06-05 NOTE — Progress Notes (Signed)
Occupational Therapy Treatment Patient Details Name: Logan [name] MRN: [id number] DOB: Aug 01, 1945 Today's Date: 06/05/2012 Time: 4098-1191 and 1058- 11-5 OT Time Calculation (min): 20 min  OT Assessment / Plan / Recommendation Comments on Treatment Session      Follow Up Recommendations  SNF    Barriers to Discharge       Equipment Recommendations  None recommended by OT    Recommendations for Other Services    Frequency Min 2X/week   Plan Discharge plan needs to be updated    Precautions / Restrictions Precautions Precautions: Back Restrictions Weight Bearing Restrictions: No   Pertinent Vitals/Pain Back 10/10 but wanted to work with therapy.  Requested muscle relaxant:  RN will bring pain meds when time.  Pt repositioned in chair    ADL  Lower Body Bathing: +2 Total assistance Lower Body Bathing: Patient Percentage: 70% Where Assessed - Lower Body Bathing: Supported sit to stand Lower Body Dressing: +2 Total assistance Lower Body Dressing: Patient Percentage: 30% Where Assessed - Lower Body Dressing: Supported sit to stand Toilet Transfer: Simulated;+2 Total assistance Toilet Transfer: Patient Percentage: 70% Toilet Transfer Method: Sit to stand (bed, ambulated, chair) Transfers/Ambulation Related to ADLs: cotx with PT.  Pt ambulated in hall and transferred to chair. Pt needs min cues for back precautions and to try to relax traps as he elevates and uses these excessively ADL Comments: Reviewed reacher for adls and pt practiced simulating with pillowcase as pants.  Pt has reacher at home. Reviewed back precautions    OT Diagnosis:    OT Problem List:   OT Treatment Interventions:     OT Goals ADL Goals Pt Will Perform Lower Body Bathing: with 2+ total assist;Sit to stand from bed;with adaptive equipment ADL Goal: Lower Body Bathing - Progress: Progressing toward goals Pt Will Perform Lower Body Dressing: with 2+ total assist;Sit to stand from bed;with adaptive  equipment ADL Goal: Lower Body Dressing - Progress: Progressing toward goals Pt Will Transfer to Toilet: with 2+ total assist;Stand pivot transfer;3-in-1;Maintaining back safety precautions ADL Goal: Toilet Transfer - Progress: Partly met (met with chair:  need to practice 3:1)  Visit Information  Last OT Received On: 06/05/12 Assistance Needed: +2    Subjective Data      Prior Functioning       Cognition  Overall Cognitive Status: Appears within functional limits for tasks assessed/performed Arousal/Alertness: Awake/alert Orientation Level: Appears intact for tasks assessed Behavior During Session: [facility] for tasks performed    Mobility  Shoulder Instructions Transfers Sit to Stand: 1: +2 Total assist;From elevated surface Sit to Stand: Patient Percentage: 70% Stand to Sit: 1: +2 Total assist;To chair/3-in-1;With upper extremity assist Stand to Sit: Patient Percentage: 80% Details for Transfer Assistance: +2 for safety; cues for hand placement and posture       Exercises     Balance     End of Session OT - End of Session Activity Tolerance: Patient tolerated treatment well Patient left: in chair;with call bell/phone within reach  GO     Rox Mcgriff 06/05/2012, 11:08 AMMaryellen Karleen Hampshire, OTR/L [phone] 06/05/2012

## 2012-06-05 NOTE — Progress Notes (Signed)
Physical Therapy Treatment Patient Details Name: Logan [name] MRN: [id number] DOB: 1945/11/28 Today's Date: 06/05/2012 Time: 1191-4782 PT Time Calculation (min): 28 min  PT Assessment / Plan / Recommendation Comments on Treatment Session  progressing, doing better overall today; continues to complain of significant back pain, RN aware, gave muscle relaxer as well; will benefit from SNF    Follow Up Recommendations  SNF     Does the patient have the potential to tolerate intense rehabilitation     Barriers to Discharge        Equipment Recommendations  None recommended by PT    Recommendations for Other Services    Frequency Min 6X/week   Plan Discharge plan remains appropriate;Frequency remains appropriate    Precautions / Restrictions Precautions Precautions: Back Restrictions Weight Bearing Restrictions: No   Pertinent Vitals/Pain     Mobility  Transfers Transfers: Sit to Stand;Stand to Sit Sit to Stand: 1: +2 Total assist;From elevated surface Sit to Stand: Patient Percentage: 70% Stand to Sit: 1: +2 Total assist;To chair/3-in-1;With upper extremity assist Stand to Sit: Patient Percentage: 80% Details for Transfer Assistance: +2 for safety; cues for hand placement and posture Ambulation/Gait Ambulation/Gait Assistance: 1: +2 Total assist Ambulation/Gait: Patient Percentage: 80% Ambulation Distance (Feet): 30 Feet Assistive device: Rolling walker Ambulation/Gait Assistance Details: +2 for chair follow, safety; cues for step length Gait Pattern: Step-to pattern;Step-through pattern;Lateral trunk lean to right;Trunk rotated posteriorly on left;Trunk flexed General Gait Details: unsteady, increased time; heavy use of  UEs    Exercises General Exercises - Lower Extremity Long Arc Quad: AROM;Both;10 reps Toe Raises: AROM;10 reps;Both;Seated Heel Raises: AROM;Both;10 reps;Seated Other Exercises Other Exercises: postural exercises, scapular retraction x 7 reps;  shoulder elevation and depression x 7 reps; forward shoulder flexion to 90* right and left with emphasis on core stabilization (pt had much difficulty with this--right greater than left)   PT Diagnosis:    PT Problem List:   PT Treatment Interventions:     PT Goals Acute Rehab PT Goals Time For Goal Achievement: 06/17/12 Potential to Achieve Goals: Good Pt will go Sit to Stand: with min assist PT Goal: Sit to Stand - Progress: Progressing toward goal Pt will go Stand to Sit: with supervision PT Goal: Stand to Sit - Progress: Progressing toward goal Pt will Ambulate: with rolling walker;51 - 150 feet;with min assist PT Goal: Ambulate - Progress: Updated due to goal met  Visit Information  Last PT Received On: 06/05/12 Assistance Needed: +2 PT/OT Co-Evaluation/Treatment: Yes    Subjective Data  Subjective: pt sitting EOB   Cognition  Overall Cognitive Status: Appears within functional limits for tasks assessed/performed Arousal/Alertness: Awake/alert Orientation Level: Appears intact for tasks assessed Behavior During Session: [facility] for tasks performed    Balance     End of Session PT - End of Session Activity Tolerance: Patient tolerated treatment well Patient left: in chair;with call bell/phone within reach   GP     [facility] 06/05/2012, 10:46 AM

## 2012-06-05 NOTE — Progress Notes (Signed)
CSW assisting with d/c planning. Pt has ST SNF bed at [facility] on Sat. if stable for d/c. D/C Summary has been faxed to SNF and [facility] medicare has provided SNF and Ambulance authorization. Week end CSW will assist with d/c planning to SNF.  Cori Razor LCSW [phone]

## 2012-06-05 NOTE — Discharge Summary (Signed)
Physician Discharge Summary   Patient ID: Logan [name] MRN: [id number] DOB/AGE: 1945-08-12 67 y.o.  Admit date: 05/26/2012 Discharge date: 06/06/2012  Primary Diagnosis:  Osteomyelitis, lumbar spine  Admission Diagnoses:  Past Medical History  Diagnosis Date  . Overweight   . Dyslipidemia   . Cardiomyopathy   . Diabetes mellitus   . PVD (peripheral vascular disease)     right to left fem -fem bypass 1997/ aortabifemoral bypass 2005 right to left fem-fem 07/2004. Secondary to occluded righ tlimb of aortobifemoral bypass  . Tobacco abuse   . Anginal pain   . Hypertension   . Shortness of breath   . Coronary artery disease     taxis DES stent circumflex 02/2003.... residual total distal circumflex and 50% LAD/ cardiolite 08/2006 inferior scar no ischemia  . Degenerative disc disease, lumbar    Discharge Diagnoses:   Principal Problem:  *Fever Active Problems:  DYSLIPIDEMIA  HYPERTENSION  CORONARY ATHEROSCLEROSIS NATIVE CORONARY ARTERY  Osteomyelitis of vertebra of lumbosacral region  Abscess in epidural space of lumbar spine  Spinal stenosis of lumbosacral region  DM type 2 (diabetes mellitus, type 2)  Non compliance w medication regimen  Hypoglycemia  Estimated Body mass index is 34.22 kg/(m^2) as calculated from the following:   Height as of this encounter: 5' 8.898"[as documented 04/21/12[(1.75 m).   Weight as of this encounter: 231 lb 0.7 oz(104.8 kg).  Classification of overweight in adults according to BMI (WHO, 1998)   Procedure:  Procedure(s) (LRB): LUMBAR LAMINECTOMY/DECOMPRESSION MICRODISCECTOMY 1 LEVEL (N/A)   Consults: ID  HPI: The patient is a 67 year old male who presented with the chief complaint of back pain. He has a history of several vascular stents and grafts, most recent femoral in the fall of 2012 by Dr. Hart Rochester. He presents with back pain 3 weeks ago after no known injury. He presented today following MRI which showed lumbar disc herniation at  L5-S1 with an epidural abscess at the same level. This patient was admitted to the hospital on Tuesday. He was  on chronic long-term Plavix, and he had severe back pain where he could not handle himself home, so I called Infectious Disease and I also called the hospitalist to manage his diabetes and we made sure he was stable and free of the Plavix before we do anything in his spine. Also, we wanted to make sure we had adequate  antibodies in his circulatory system in order to prevent any spread of the infection during our dissection.  Laboratory Data: Admission on 05/26/2012  No results displayed because visit has over 200 results.    Admission on 05/02/2012, Discharged on 05/02/2012  Component Date Value Range Status  . WBC 05/02/2012 14.2* 4.0 - 10.5 K/uL Final  . RBC 05/02/2012 3.93* 4.22 - 5.81 MIL/uL Final  . Hemoglobin 05/02/2012 11.2* 13.0 - 17.0 g/dL Final  . HCT 62/95/2841 33.7* 39.0 - 52.0 % Final  . MCV 05/02/2012 85.8  78.0 - 100.0 fL Final  . MCH 05/02/2012 28.5  26.0 - 34.0 pg Final  . MCHC 05/02/2012 33.2  30.0 - 36.0 g/dL Final  . RDW 32/44/0102 13.8  11.5 - 15.5 % Final  . Platelets 05/02/2012 196  150 - 400 K/uL Final  . Neutrophils Relative 05/02/2012 74  43 - 77 % Final  . Neutro Abs 05/02/2012 10.6* 1.7 - 7.7 K/uL Final  . Lymphocytes Relative 05/02/2012 16  12 - 46 % Final  . Lymphs Abs 05/02/2012 2.2  0.7 -  4.0 K/uL Final  . Monocytes Relative 05/02/2012 10  3 - 12 % Final  . Monocytes Absolute 05/02/2012 1.4* 0.1 - 1.0 K/uL Final  . Eosinophils Relative 05/02/2012 0  0 - 5 % Final  . Eosinophils Absolute 05/02/2012 0.0  0.0 - 0.7 K/uL Final  . Basophils Relative 05/02/2012 0  0 - 1 % Final  . Basophils Absolute 05/02/2012 0.0  0.0 - 0.1 K/uL Final  . Sodium 05/02/2012 131* 135 - 145 mEq/L Final  . Potassium 05/02/2012 4.3  3.5 - 5.1 mEq/L Final  . Chloride 05/02/2012 95* 96 - 112 mEq/L Final  . CO2 05/02/2012 22  19 - 32 mEq/L Final  . Glucose, Bld 05/02/2012  310* 70 - 99 mg/dL Final  . BUN 82/95/6213 17  6 - 23 mg/dL Final  . Creatinine, Ser 05/02/2012 1.21  0.50 - 1.35 mg/dL Final  . Calcium 08/65/7846 9.4  8.4 - 10.5 mg/dL Final  . GFR calc non Af Amer 05/02/2012 60* >90 mL/min Final  . GFR calc Af Amer 05/02/2012 69* >90 mL/min Final   Comment:                                 The eGFR has been calculated                          using the CKD EPI equation.                          This calculation has not been                          validated in all clinical                          situations.                          eGFR's persistently                          <90 mL/min signify                          possible Chronic Kidney Disease.  . Color, Urine 05/02/2012 YELLOW  YELLOW Final  . APPearance 05/02/2012 CLOUDY* CLEAR Final  . Specific Gravity, Urine 05/02/2012 1.034* 1.005 - 1.030 Final  . pH 05/02/2012 5.0  5.0 - 8.0 Final  . Glucose, UA 05/02/2012 >1000* NEGATIVE mg/dL Final  . Hgb urine dipstick 05/02/2012 NEGATIVE  NEGATIVE Final  . Bilirubin Urine 05/02/2012 NEGATIVE  NEGATIVE Final  . Ketones, ur 05/02/2012 NEGATIVE  NEGATIVE mg/dL Final  . Protein, ur 96/29/5284 NEGATIVE  NEGATIVE mg/dL Final  . Urobilinogen, UA 05/02/2012 0.2  0.0 - 1.0 mg/dL Final  . Nitrite 13/24/4010 NEGATIVE  NEGATIVE Final  . Leukocytes, UA 05/02/2012 NEGATIVE  NEGATIVE Final  . Squamous Epithelial / LPF 05/02/2012 FEW* RARE Final  . Glucose-Capillary 05/02/2012 160* 70 - 99 mg/dL Final  . Glucose-Capillary 05/02/2012 103* 70 - 99 mg/dL Final  . Glucose-Capillary 05/02/2012 96  70 - 99 mg/dL Final  . Glucose-Capillary 05/02/2012 74  70 - 99 mg/dL Final     X-Rays:Dg Lumbar Spine 2-3 Views  05/26/2012  *RADIOLOGY REPORT*  Clinical Data: History of degenerative disc disease.  LUMBAR SPINE - 2-3 VIEW  Comparison: 05/02/2012.  Findings: There are five non-rib bearing lumbar-type vertebral bodies which are labeled L1-L5. Narrowing of intervertebral  disc space at L5-S1 level.  Multilevel marginal osteophyte formation is seen in the spine.  No fracture or bony destruction is seen.  No significant subluxation is evident.  IMPRESSION: The vertebrae were numbered. Narrowing of L5-S1 intervertebral disc space. Multilevel osteophyte formation in the spine .   Original Report Authenticated By: Onalee Hua Call    Ir Fluoro Guide Cv Line Right  05/29/2012  *RADIOLOGY REPORT*  Clinical Data: Lumbar osteomyelitis.  PICC LINE PLACEMENT WITH ULTRASOUND AND FLUOROSCOPIC  GUIDANCE  Fluoroscopy Time: 0.1 minutes.  The right arm was prepped with chlorhexidine, draped in the usual sterile fashion using maximum barrier technique (cap and mask, sterile gown, sterile gloves, large sterile sheet, hand hygiene and cutaneous antisepsis) and infiltrated locally with 1% Lidocaine.  Ultrasound demonstrated patency of the right basilic vein, and this was documented with an image.  Under real-time ultrasound guidance, this vein was accessed with a 21 gauge micropuncture needle and image documentation was performed.  The needle was exchanged over a guidewire for a peel-away sheath through which a 5 [place] single lumen PICC trimmed to 40 cm was advanced, positioned with its tip at the lower SVC/right atrial junction.  Fluoroscopy during the procedure and fluoro spot radiograph confirms appropriate catheter position.  The catheter was flushed, secured to the skin with Prolene sutures, and covered with a sterile dressing.  Complications:  None  IMPRESSION: Successful right arm PICC line placement with ultrasound and fluoroscopic guidance.  The catheter is ready for use.   Original Report Authenticated By: Irish Lack, M.D.    Ir US Guide Vasc Access Right  05/29/2012  *RADIOLOGY REPORT*  Clinical Data: Lumbar osteomyelitis.  PICC LINE PLACEMENT WITH ULTRASOUND AND FLUOROSCOPIC  GUIDANCE  Fluoroscopy Time: 0.1 minutes.  The right arm was prepped with chlorhexidine, draped in the usual  sterile fashion using maximum barrier technique (cap and mask, sterile gown, sterile gloves, large sterile sheet, hand hygiene and cutaneous antisepsis) and infiltrated locally with 1% Lidocaine.  Ultrasound demonstrated patency of the right basilic vein, and this was documented with an image.  Under real-time ultrasound guidance, this vein was accessed with a 21 gauge micropuncture needle and image documentation was performed.  The needle was exchanged over a guidewire for a peel-away sheath through which a 5 [place] single lumen PICC trimmed to 40 cm was advanced, positioned with its tip at the lower SVC/right atrial junction.  Fluoroscopy during the procedure and fluoro spot radiograph confirms appropriate catheter position.  The catheter was flushed, secured to the skin with Prolene sutures, and covered with a sterile dressing.  Complications:  None  IMPRESSION: Successful right arm PICC line placement with ultrasound and fluoroscopic guidance.  The catheter is ready for use.   Original Report Authenticated By: Irish Lack, M.D.    Dg Chest Port 1 View  06/04/2012  *RADIOLOGY REPORT*  Clinical Data: Cough, fever  PORTABLE CHEST - 1 VIEW  Comparison: Portable exam 1125 hours compared to 05/02/2012  Findings: Right arm PICC line tip projects over SVC. Normal heart size, mediastinal contours and pulmonary vascularity for technique and positioning. Persistent elevation of right diaphragm. Lungs clear. No pleural effusion or pneumothorax.  IMPRESSION: No acute abnormalities.   Original Report Authenticated By: Ulyses Southward, M.D.    Dg Spine  Portable 1 View  06/02/2012  *RADIOLOGY REPORT*  Clinical Data: Lumbar disc disease.  PORTABLE SPINE - 1 VIEW  Comparison: Radiographs dated 05/26/2012  Findings: Instruments at the L5-S1 level.  Clamp is on the L5 spinous process.  IMPRESSION: Instruments at L5-S1.   Original Report Authenticated By: Francene Boyers, M.D.    Dg Spine Portable 1 View  06/02/2012  *RADIOLOGY  REPORT*  Clinical Data: Decompression L5-S1  PORTABLE SPINE - 1 VIEW  Comparison: 05/26/2012, CT 05/02/2012  Findings: There is a needle directed at the L4 spinous process.  There is a second needle directed at the L5 spinous process.  IMPRESSION: Surgical localization L4 and L5 spinous processes.   Original Report Authenticated By: Janeece Riggers, M.D.     EKG: Orders placed during the hospital encounter of 03/20/12  . EKG     Hospital Course: Logan [name] is a 67 y.o. who was admitted to [facility]. He was monitored and consulted on by ID. He was kept on the floor for one week preoperatively for antibiotic coverage and to come off of his Plavix. He was brought to the operating room on 06/03/2012 and underwent Procedure(s): LUMBAR LAMINECTOMY/DECOMPRESSION MICRODISCECTOMY 1 LEVEL.  Patient tolerated the procedure well and was later transferred to the recovery room and then to the orthopaedic floor for postoperative care.  They were given PO and IV analgesics for pain control following their surgery.  Antibiotic coverage included  Anti-infectives     Start     Dose/Rate Route Frequency Ordered Stop   06/05/12 0000   vancomycin (VANCOCIN) 1 GM/200ML SOLN        1,000 mg 200 mL/hr over 60 Minutes Intravenous Every 12 hours 06/05/12 0733 07/17/12 2359   06/05/12 0000   dextrose 5 % SOLN 50 mL with cefTRIAXone 2 G SOLR 2 g        2 g 100 mL/hr over 30 Minutes Intravenous Every 24 hours 06/05/12 0733 07/17/12 2359   06/02/12 2030   sulfamethoxazole-trimethoprim (BACTRIM DS) 800-160 MG per tablet 1 tablet  Status:  Discontinued        1 tablet Oral Daily 06/02/12 2024 06/02/12 2039   06/02/12 1654   polymyxin B 500,000 Units, bacitracin 50,000 Units in sodium chloride irrigation 0.9 % 500 mL irrigation  Status:  Discontinued          As needed 06/02/12 1655 06/02/12 1851   05/28/12 1400   cefTRIAXone (ROCEPHIN) 2 g in dextrose 5 % 50 mL IVPB        2 g 100 mL/hr over 30 Minutes  Intravenous Every 24 hours 05/28/12 1304     05/27/12 0000   vancomycin (VANCOCIN) IVPB 1000 mg/200 mL premix        1,000 mg 200 mL/hr over 60 Minutes Intravenous Every 12 hours 05/26/12 1456     05/26/12 1100   vancomycin (VANCOCIN) 2,000 mg in sodium chloride 0.9 % 500 mL IVPB        2,000 mg 250 mL/hr over 120 Minutes Intravenous  Once 05/26/12 1059 05/26/12 1448        PT was ordered for gait training and ambulation assistance.  Discharge planning consulted to help with postop disposition and equipment needs.  Patient had a decent night on the evening of surgery and was hesitant to get up OOB with therapy on day one. Drain was pulled without difficulty.  Continued to progress slowly with therapy into day two.  Dressing was changed on day two  and the incision was clean and dry. No growth on cultures. Patient was seen in rounds post op day three and was ready to go to SNF with appropriate antibiotic coverage.   Discharge Medications: Prior to Admission medications   Medication Sig Start Date End Date Taking? Authorizing Provider  amLODipine (NORVASC) 2.5 MG tablet Take 2.5 mg by mouth every morning.    Yes Historical Provider, MD  aspirin EC 81 MG tablet Take 81 mg by mouth every morning.    Yes Historical Provider, MD  clopidogrel (PLAVIX) 75 MG tablet Take 1 tablet (75 mg total) by mouth daily with breakfast. 12/26/11 12/25/12 Yes Amelia Jo Roczniak, PA  diazepam (VALIUM) 5 MG tablet Take 5 mg by mouth 2 (two) times daily as needed. Back spasms.   Yes Historical Provider, MD  glimepiride (AMARYL) 4 MG tablet Take 4 mg by mouth daily before breakfast.    Yes Historical Provider, MD  indomethacin (INDOCIN) 50 MG capsule Take 50 mg by mouth daily as needed. Gout flare-up.   Yes Historical Provider, MD  insulin aspart (NOVOLOG) 100 UNIT/ML injection Inject 10 Units into the skin daily before supper.   Yes Historical Provider, MD  insulin glargine (LANTUS) 100 UNIT/ML injection Inject 50-60 Units  into the skin 2 (two) times daily. 50 units in the morning and 60 units in the evening   Yes Historical Provider, MD  lisinopril (PRINIVIL,ZESTRIL) 40 MG tablet Take 40 mg by mouth every morning.    Yes Historical Provider, MD  metFORMIN (GLUCOPHAGE) 500 MG tablet Take 500 mg by mouth 2 (two) times daily with a meal.    Yes Historical Provider, MD  metoprolol (LOPRESSOR) 50 MG tablet Take 50 mg by mouth 2 (two) times daily.   Yes Historical Provider, MD  Multiple Vitamins-Minerals (MULTIVITAMIN PO) Take 1 tablet by mouth every morning.    Yes Historical Provider, MD  Omega-3 Fatty Acids (FISH OIL) 1000 MG CAPS Take 1 capsule by mouth 2 (two) times daily.    Yes Historical Provider, MD  simvastatin (ZOCOR) 20 MG tablet Take 20 mg by mouth every evening.   Yes Historical Provider, MD  dextrose 5 % SOLN 50 mL with cefTRIAXone 2 G SOLR 2 g Inject 2 g into the vein daily. 06/05/12 07/17/12  Quadasia Newsham Tamala Ser, PA  HYDROmorphone (DILAUDID) 2 MG tablet Take 1-2 tablets (2-4 mg total) by mouth every 4 (four) hours as needed. 06/05/12   Hadi Dubin Tamala Ser, PA  methocarbamol (ROBAXIN) 500 MG tablet Take 1 tablet (500 mg total) by mouth every 6 (six) hours as needed. 06/05/12   Kemal Amores Tamala Ser, PA  polyethylene glycol (MIRALAX / GLYCOLAX) packet Take 17 g by mouth daily as needed. 06/05/12   Vitaly Wanat Tamala Ser, PA  vancomycin (VANCOCIN) 1 GM/200ML SOLN Inject 200 mLs (1,000 mg total) into the vein every 12 (twelve) hours. 06/05/12 07/17/12  Tryston Gilliam Tamala Ser, PA    Diet: Diabetic diet Activity:WBAT Follow-up:in 2 weeks Disposition - Skilled nursing facility Discharged Condition: fair   Discharge Orders    Future Appointments: Provider: Department: Dept Phone: Center:   07/28/2012 9:00 AM [facility] [phone] [facility]   07/28/2012 10:00 AM Pryor Ochoa, MD [facility] [phone] [facility]     Future Orders Please  Complete By Expires   Diet Carb Modified      Call MD / Call 911      Comments:   If you experience chest pain  or shortness of breath, CALL 911 and be transported to the hospital emergency room.  If you develope a fever above 101 F, pus (white drainage) or increased drainage or redness at the wound, or calf pain, call your surgeon's office.   Constipation Prevention      Comments:   Drink plenty of fluids.  Prune juice may be helpful.  You may use a stool softener, such as Colace (over the counter) 100 mg twice a day.  Use MiraLax (over the counter) for constipation as needed.   Increase activity slowly as tolerated      Discharge instructions      Comments:   Change your dressing daily. Shower only, no tub bath. Call if any temperatures greater than 101 or any wound complications: [phone] during the day and ask for Dr. Jeannetta Ellis nurse, Mackey Birchwood. Walk with your walker until therapy deems it safe enough for you to walk with a cane Follow up in the office in 2 weeks   Driving restrictions      Comments:   No driving   Lifting restrictions      Comments:   No lifting       Medication List     As of 06/05/2012  7:33 AM    STOP taking these medications         bag balm Oint ointment      ciprofloxacin 500 MG tablet   Commonly known as: CIPRO      oxyCODONE-acetaminophen 5-325 MG per tablet   Commonly known as: PERCOCET/ROXICET      sulfamethoxazole-trimethoprim 800-160 MG per tablet   Commonly known as: BACTRIM DS      TAKE these medications         amLODipine 2.5 MG tablet   Commonly known as: NORVASC   Take 2.5 mg by mouth every morning.      aspirin EC 81 MG tablet   Take 81 mg by mouth every morning.      clopidogrel 75 MG tablet   Commonly known as: PLAVIX   Take 1 tablet (75 mg total) by mouth daily with breakfast.      dextrose 5 % SOLN 50 mL with cefTRIAXone 2 G SOLR 2 g   Inject 2 g into the vein daily.      diazepam 5 MG tablet   Commonly known  as: VALIUM   Take 5 mg by mouth 2 (two) times daily as needed. Back spasms.      Fish Oil 1000 MG Caps   Take 1 capsule by mouth 2 (two) times daily.      glimepiride 4 MG tablet   Commonly known as: AMARYL   Take 4 mg by mouth daily before breakfast.      HYDROmorphone 2 MG tablet   Commonly known as: DILAUDID   Take 1-2 tablets (2-4 mg total) by mouth every 4 (four) hours as needed.      indomethacin 50 MG capsule   Commonly known as: INDOCIN   Take 50 mg by mouth daily as needed. Gout flare-up.      insulin aspart 100 UNIT/ML injection   Commonly known as: novoLOG   Inject 10 Units into the skin daily before supper.      insulin glargine 100 UNIT/ML injection   Commonly known as: LANTUS   Inject 50-60 Units into the skin 2 (two) times daily. 50 units in the morning and 60 units in the evening      lisinopril 40  MG tablet   Commonly known as: PRINIVIL,ZESTRIL   Take 40 mg by mouth every morning.      metFORMIN 500 MG tablet   Commonly known as: GLUCOPHAGE   Take 500 mg by mouth 2 (two) times daily with a meal.      methocarbamol 500 MG tablet   Commonly known as: ROBAXIN   Take 1 tablet (500 mg total) by mouth every 6 (six) hours as needed.      metoprolol 50 MG tablet   Commonly known as: LOPRESSOR   Take 50 mg by mouth 2 (two) times daily.      MULTIVITAMIN PO   Take 1 tablet by mouth every morning.      polyethylene glycol packet   Commonly known as: MIRALAX / GLYCOLAX   Take 17 g by mouth daily as needed.      simvastatin 20 MG tablet   Commonly known as: ZOCOR   Take 20 mg by mouth every evening.      vancomycin 1 GM/200ML Soln   Commonly known as: VANCOCIN   Inject 200 mLs (1,000 mg total) into the vein every 12 (twelve) hours.         SignedCeledonio Savage, Monserrat Vidaurri [name] 06/05/2012, 7:33 AM

## 2012-06-05 NOTE — Progress Notes (Addendum)
[facility]  Date of Admission:  05/26/2012  Antibiotics: Vancomycin day 11 Rocephin Day 9  Subjective: Pain in back, no further fever  Objective: Temp:  [99.4 F (37.4 C)-99.5 F (37.5 C)] 99.5 F (37.5 C) (01/17 0511) Pulse Rate:  [103-124] 103  (01/17 0511) Resp:  [14-16] 14  (01/17 0511) BP: (117-138)/(69-74) 117/69 mmHg (01/17 0940) SpO2:  [96 %] 96 % (01/17 0511)  General: Awake, alert, mild distress with pain Skin: no rashes Lungs: CTA B Cor: RRR without mrg  Lab Results Lab Results  Component Value Date   WBC 11.0* 06/05/2012   HGB 8.8* 06/05/2012   HCT 26.2* 06/05/2012   MCV 82.1 06/05/2012   PLT 258 06/05/2012    Lab Results  Component Value Date   CREATININE 0.95 06/05/2012   BUN 8 06/05/2012   NA 135 06/05/2012   K 3.4* 06/05/2012   CL 100 06/05/2012   CO2 26 06/05/2012    Lab Results  Component Value Date   ALT 16 05/27/2012   AST 13 05/27/2012   ALKPHOS 82 05/27/2012   BILITOT 0.3 05/27/2012      Microbiology: Recent Results (from the past 240 hour(s))  SURGICAL PCR SCREEN     Status: Normal   Collection Time   05/31/12 11:24 PM      Component Value Range Status Comment   MRSA, PCR NEGATIVE  NEGATIVE Final    Staphylococcus aureus NEGATIVE  NEGATIVE Final   SURGICAL PCR SCREEN     Status: Normal   Collection Time   06/02/12  3:15 PM      Component Value Range Status Comment   MRSA, PCR NEGATIVE  NEGATIVE Final    Staphylococcus aureus NEGATIVE  NEGATIVE Final   ANAEROBIC CULTURE     Status: Normal (Preliminary result)   Collection Time   06/02/12  3:39 PM      Component Value Range Status Comment   Specimen Description TISSUE   Final    Special Requests LUMBAR   Final    Gram Stain     Final    Value: NO WBC SEEN     NO ORGANISMS SEEN   Culture     Final    Value: NO ANAEROBES ISOLATED; CULTURE IN PROGRESS FOR 5 DAYS   Report Status PENDING   Incomplete   TISSUE CULTURE     Status: Normal   Collection Time   06/02/12   3:39 PM      Component Value Range Status Comment   Specimen Description TISSUE   Final    Special Requests NONE LUMBAR   Final    Gram Stain     Final    Value: NO WBC SEEN     NO ORGANISMS SEEN   Culture NO GROWTH 2 DAYS   Final    Report Status 06/05/2012 FINAL   Final   ANAEROBIC CULTURE     Status: Normal (Preliminary result)   Collection Time   06/02/12  5:51 PM      Component Value Range Status Comment   Specimen Description WOUND LUMBAR   Final    Special Requests NONE   Final    Gram Stain     Final    Value: FEW WBC PRESENT,BOTH PMN AND MONONUCLEAR     NO SQUAMOUS EPITHELIAL CELLS SEEN     NO ORGANISMS SEEN   Culture     Final    Value: NO ANAEROBES ISOLATED; CULTURE IN  PROGRESS FOR 5 DAYS   Report Status PENDING   Incomplete   WOUND CULTURE     Status: Normal   Collection Time   06/02/12  5:51 PM      Component Value Range Status Comment   Specimen Description WOUND LUMBAR   Final    Special Requests NONE   Final    Gram Stain     Final    Value: FEW WBC PRESENT,BOTH PMN AND MONONUCLEAR     NO SQUAMOUS EPITHELIAL CELLS SEEN     NO ORGANISMS SEEN   Culture NO GROWTH 2 DAYS   Final    Report Status 06/05/2012 FINAL   Final     Studies/Results: Dg Chest Port 1 View  06/04/2012  *RADIOLOGY REPORT*  Clinical Data: Cough, fever  PORTABLE CHEST - 1 VIEW  Comparison: Portable exam 1125 hours compared to 05/02/2012  Findings: Right arm PICC line tip projects over SVC. Normal heart size, mediastinal contours and pulmonary vascularity for technique and positioning. Persistent elevation of right diaphragm. Lungs clear. No pleural effusion or pneumothorax.  IMPRESSION: No acute abnormalities.   Original Report Authenticated By: Ulyses Southward, M.D.     Assessment/Plan: 1)  Epidural abscess and osteomyelitis of L5-S1.  Cultures done and ngtd. On empiric vancomycin and Rocephin.  Previously on multple courses of antibiotics.  Will need a prolonged course of at least 6-8 weeks IV  through February 19th (6 weeks).  Baseline CRP 5.5, ESR 131.   - he will need weekly CBC with diff, CMP, vanco trough -vancomycin and ceftriaxone per home health protocol -we will arrange follow up at [facility] in about 2 weeks  2) fever - new onset.  CXR noted and no opacity.  Otherwise stable.  Continue to observe.    Staci Righter, MD [facility] [phone] pager   06/05/2012, 2:40 PM

## 2012-06-05 NOTE — Progress Notes (Signed)
Comments:  06/05/2012 Viviann Broyles bsn rn ccm [phone] CM SPOKE WITH PATIENT. Plans are for patient to go to SNF rehab. CM signing off.  06/04/2012 Colleen Can BSN RN CCM [phone] Post op lumbarr laminectomy/decompression L5-S1 01/14. Per pt/ot note pt having back spasms and pain(sat on side on bed). CM will follow progress for d/c needs.

## 2012-06-05 NOTE — Progress Notes (Signed)
Inpatient Diabetes Program Recommendations  AACE/ADA: New Consensus Statement on Inpatient Glycemic Control (2013)  Target Ranges:  Prepandial:   less than 140 mg/dL      Peak postprandial:   less than 180 mg/dL (1-2 hours)      Critically ill patients:  140 - 180 mg/dL   Reason for Visit: Hyperglycemia  Had CBG if 230 at lunch.  Pt refused insulin coverage.  Good glycemic control without basal insulin for past 3 days.  To be discharged to SNF in next day or two.  Results for FELICIA, BOTH (MRN [id number]) as of 06/05/2012 15:23  Ref. Range 06/04/2012 07:38 06/04/2012 12:30 06/04/2012 17:18 06/05/2012 07:28 06/05/2012 13:34  Glucose-Capillary Latest Range: 70-99 mg/dL 130 (H) 865 (H) 784 (H) 159 (H) 230 (H)

## 2012-06-06 LAB — GLUCOSE, CAPILLARY
Glucose-Capillary: 176 mg/dL — ABNORMAL HIGH (ref 70–99)
Glucose-Capillary: 184 mg/dL — ABNORMAL HIGH (ref 70–99)

## 2012-06-06 MED ORDER — HYDROMORPHONE HCL 2 MG PO TABS: 2.0000 mg | ORAL_TABLET | ORAL | Status: DC | PRN

## 2012-06-06 MED ORDER — INSULIN GLARGINE 100 UNIT/ML ~~LOC~~ SOLN: 4.0000 [IU] | Freq: Every day | SUBCUTANEOUS | Status: DC

## 2012-06-06 NOTE — Progress Notes (Signed)
Report called to [facility] to [place], [place]

## 2012-06-06 NOTE — Progress Notes (Signed)
Subjective: 4 Days Post-Op Procedure(s) (LRB): LUMBAR LAMINECTOMY/DECOMPRESSION MICRODISCECTOMY 1 LEVEL (N/A) Patient reports pain as moderate.    Objective: Vital signs in last 24 hours: Temp:  [98.5 F (36.9 C)-99.8 F (37.7 C)] 99.3 F (37.4 C) (01/18 0620) Pulse Rate:  [95-105] 95  (01/18 0620) Resp:  [15-16] 16  (01/18 0620) BP: (152-162)/(66-80) 154/80 mmHg (01/18 0620) SpO2:  [95 %-99 %] 99 % (01/18 0620)  Intake/Output from previous day: 01/17 0701 - 01/18 0700 In: 2946.3 [P.O.:900; I.V.:791.3; IV Piggyback:1255] Out: 2275 [Urine:2275] Intake/Output this shift: Total I/O In: 363 [P.O.:360; I.V.:3] Out: 800 [Urine:800]   Basename 06/05/12 0500 06/04/12 0355  HGB 8.8* 8.7*    Basename 06/05/12 0500 06/04/12 0355  WBC 11.0* 11.0*  RBC 3.19* 3.23*  HCT 26.2* 26.8*  PLT 258 249    Basename 06/05/12 0500 06/04/12 1218  NA 135 133*  K 3.4* 3.9  CL 100 98  CO2 26 25  BUN 8 10  CREATININE 0.95 0.98  GLUCOSE 196* 189*  CALCIUM 9.2 9.6   No results found for this basename: LABPT:2,INR:2 in the last 72 hours  Lumbar spine wound dressed and dry.  Assessment/Plan: 4 Days Post-Op Procedure(s) (LRB): LUMBAR LAMINECTOMY/DECOMPRESSION MICRODISCECTOMY 1 LEVEL (N/A) Discharge to SNF  Logan [name] 06/06/2012, 11:11 AM

## 2012-06-06 NOTE — Progress Notes (Signed)
Clinical Social Work Department CLINICAL SOCIAL WORK PLACEMENT NOTE 06/06/2012  Patient:  Logan [name], Logan [name]  Account Number:  [id number] Admit date:  05/26/2012  Clinical Social Worker:  Cori Razor, LCSW  Date/time:  06/04/2012 08:10 AM  Clinical Social Work is seeking post-discharge placement for this patient at the following level of care:   SKILLED NURSING   (*CSW will update this form in Epic as items are completed)   06/03/2012  Patient/family provided with [facility] Department of Clinical Social Work's list of facilities offering this level of care within the geographic area requested by the patient (or if unable, by the patient's family).  06/03/2012  Patient/family informed of their freedom to choose among providers that offer the needed level of care, that participate in Medicare, Medicaid or managed care program needed by the patient, have an available bed and are willing to accept the patient.  06/03/2012  Patient/family informed of [facility]' ownership interest in [facility], as well as of the fact that they are under no obligation to receive care at this facility.  PASARR submitted to EDS on  PASARR number received from EDS on   FL2 transmitted to all facilities in geographic area requested by pt/family on  06/03/2012 FL2 transmitted to all facilities within larger geographic area on   Patient informed that his/her managed care company has contracts with or will negotiate with  certain facilities, including the following:     Patient/family informed of bed offers received:  06/04/2012 Patient chooses bed at [facility]F Physician recommends and patient chooses bed at    Patient to be transferred to [facility] SNF on  06/05/2012 Patient to be transferred to facility by [facility]  The following physician request were entered in Epic:   Additional Comments: [employer] provided prior approval for SNF  & amb transport.  Cori Razor  LCSW [phone]

## 2012-06-06 NOTE — Progress Notes (Signed)
[facility] PROGRESS NOTE  Logan [name] HYQ:[id number] DOB: November 26, 1945 DOA: 05/26/2012  PCP: Kirstie Peri, MD  Brief HPI: 67 y/o with history of PVD s/p right to left fem -fem bypass 1997/ aortabifemoral bypass 2005 right to left fem-fem 07/2004 secondary to occluded righ tlimb of aortobifemoral bypass with history of chronically infected left groin wound at surgical site listed above. Presented to the hospital with worsening back pain with no history of trauma/injury. On further work up was found to have an epidural abscess. Medicine was consulted for management of patient's diabetes given his history. Patient denied any fevers, chills, nausea, emesis, hematuria, dysuria, or hypoglycemic symptoms.  Past medical history:  Past Medical History  Diagnosis Date  . Overweight   . Dyslipidemia   . Cardiomyopathy   . Diabetes mellitus   . PVD (peripheral vascular disease)     right to left fem -fem bypass 1997/ aortabifemoral bypass 2005 right to left fem-fem 07/2004. Secondary to occluded righ tlimb of aortobifemoral bypass  . Tobacco abuse   . Anginal pain   . Hypertension   . Shortness of breath   . Coronary artery disease     taxis DES stent circumflex 02/2003.... residual total distal circumflex and 50% LAD/ cardiolite 08/2006 inferior scar no ischemia  . Degenerative disc disease, lumbar     Primary Service: Ortho Other Consultants: ID  Procedures: Spine surgery 1/14  Antibiotics: Vancomycin and Rocephin per ID  Subjective: Patient continues to have back pain. But was able to walk yesterday. Denies any other complaints currently. Having BM daily.  Objective: Vital Signs  Filed Vitals:   06/05/12 0940 06/05/12 1450 06/05/12 2123 06/06/12 0620  BP: 117/69 152/68 162/66 154/80  Pulse:  104 105 95  Temp:  98.5 F (36.9 C) 99.8 F (37.7 C) 99.3 F (37.4 C)  TempSrc:  Oral Oral Oral  Resp:  15 16 16   Height:      Weight:      SpO2:  98% 95% 99%    Intake/Output  Summary (Last 24 hours) at 06/06/12 9528 Last data filed at 06/06/12 4132  Gross per 24 hour  Intake 3306.33 ml  Output   2650 ml  Net 656.33 ml   Filed Weights   05/26/12 1327  Weight: 104.8 kg (231 lb 0.7 oz)    General appearance: alert, cooperative, appears stated age, no distress and moderately obese Head: Normocephalic, without obvious abnormality, atraumatic Resp: Clear to auscultation.  Cardio: regular rate and rhythm, S1, S2 normal, no murmur, click, rub or gallop GI: soft, non-tender; bowel sounds normal; no masses,  no organomegaly Extremities: extremities normal, atraumatic, no cyanosis or edema Neurologic: Alert and oriented x 3. No focal deficits.  Lab Results:  Basic Metabolic Panel:  Lab 06/05/12 4401 06/04/12 1218 06/04/12 0355 05/31/12 0645  NA 135 133* 133* 137  K 3.4* 3.9 3.8 3.5  CL 100 98 98 102  CO2 26 25 25 26   GLUCOSE 196* 189* 150* 76  BUN 8 10 10 14   CREATININE 0.95 0.98 0.99 1.03  CALCIUM 9.2 9.6 9.3 9.4  MG -- -- -- --  PHOS -- -- -- --   CBG:  Lab 06/06/12 0719 06/06/12 0358 06/06/12 0006 06/05/12 2121 06/05/12 1756  GLUCAP 184* 176* 174* 201* 162*    Recent Results (from the past 240 hour(s))  SURGICAL PCR SCREEN     Status: Normal   Collection Time   05/31/12 11:24 PM      Component Value Range Status  Comment   MRSA, PCR NEGATIVE  NEGATIVE Final    Staphylococcus aureus NEGATIVE  NEGATIVE Final   SURGICAL PCR SCREEN     Status: Normal   Collection Time   06/02/12  3:15 PM      Component Value Range Status Comment   MRSA, PCR NEGATIVE  NEGATIVE Final    Staphylococcus aureus NEGATIVE  NEGATIVE Final   ANAEROBIC CULTURE     Status: Normal (Preliminary result)   Collection Time   06/02/12  3:39 PM      Component Value Range Status Comment   Specimen Description TISSUE   Final    Special Requests LUMBAR   Final    Gram Stain     Final    Value: NO WBC SEEN     NO ORGANISMS SEEN   Culture     Final    Value: NO ANAEROBES  ISOLATED; CULTURE IN PROGRESS FOR 5 DAYS   Report Status PENDING   Incomplete   TISSUE CULTURE     Status: Normal   Collection Time   06/02/12  3:39 PM      Component Value Range Status Comment   Specimen Description TISSUE   Final    Special Requests NONE LUMBAR   Final    Gram Stain     Final    Value: NO WBC SEEN     NO ORGANISMS SEEN   Culture NO GROWTH 2 DAYS   Final    Report Status 06/05/2012 FINAL   Final   ANAEROBIC CULTURE     Status: Normal (Preliminary result)   Collection Time   06/02/12  5:51 PM      Component Value Range Status Comment   Specimen Description WOUND LUMBAR   Final    Special Requests NONE   Final    Gram Stain     Final    Value: FEW WBC PRESENT,BOTH PMN AND MONONUCLEAR     NO SQUAMOUS EPITHELIAL CELLS SEEN     NO ORGANISMS SEEN   Culture     Final    Value: NO ANAEROBES ISOLATED; CULTURE IN PROGRESS FOR 5 DAYS   Report Status PENDING   Incomplete   WOUND CULTURE     Status: Normal   Collection Time   06/02/12  5:51 PM      Component Value Range Status Comment   Specimen Description WOUND LUMBAR   Final    Special Requests NONE   Final    Gram Stain     Final    Value: FEW WBC PRESENT,BOTH PMN AND MONONUCLEAR     NO SQUAMOUS EPITHELIAL CELLS SEEN     NO ORGANISMS SEEN   Culture NO GROWTH 2 DAYS   Final    Report Status 06/05/2012 FINAL   Final       Studies/Results: Dg Chest Port 1 View  06/04/2012  *RADIOLOGY REPORT*  Clinical Data: Cough, fever  PORTABLE CHEST - 1 VIEW  Comparison: Portable exam 1125 hours compared to 05/02/2012  Findings: Right arm PICC line tip projects over SVC. Normal heart size, mediastinal contours and pulmonary vascularity for technique and positioning. Persistent elevation of right diaphragm. Lungs clear. No pleural effusion or pneumothorax.  IMPRESSION: No acute abnormalities.   Original Report Authenticated By: Ulyses Southward, M.D.     Medications:  Scheduled:    . amLODipine  2.5 mg Oral q morning - 10a  .  cefTRIAXone (ROCEPHIN)  IV  2 g Intravenous Q24H  . clopidogrel  75 mg Oral Q breakfast  . insulin aspart  0-15 Units Subcutaneous TID WC  . lisinopril  40 mg Oral q morning - 10a  . metoprolol  50 mg Oral BID  . simvastatin  20 mg Oral QPM  . sodium chloride  3 mL Intravenous Q12H  . vancomycin  1,000 mg Intravenous Q12H   Continuous:    . sodium chloride 50 mL/hr at 06/05/12 0817  . lactated ringers     NWG:NFAOZH chloride, acetaminophen, acetaminophen, bisacodyl, diazepam, HYDROmorphone (DILAUDID) injection, HYDROmorphone, menthol-cetylpyridinium, methocarbamol (ROBAXIN) IV, methocarbamol, ondansetron (ZOFRAN) IV, ondansetron, phenol, polyethylene glycol, sodium chloride, sodium chloride  Assessment/Plan:  Principal Problem:  *Fever Active Problems:  DYSLIPIDEMIA  HYPERTENSION  CORONARY ATHEROSCLEROSIS NATIVE CORONARY ARTERY  Osteomyelitis of vertebra of lumbosacral region  Abscess in epidural space of lumbar spine  Spinal stenosis of lumbosacral region  DM type 2 (diabetes mellitus, type 2)  Non compliance w medication regimen  Hypoglycemia   Fever Likely secondary to epidural abscess. No other source found. Continue to monitor. No changes to treatment plan currently.   Diabetes mellitus type 2 with episodes of Hypoglycemia Hypoglycemia appears to have resolved. CBG ran high yesterday with some levels higher than 200. At this time it may be safe to resume low dose Lantus and monitor closely. Will start at Lantus 4 units daily. Can resume Metformin at discharge along with SSI coverage. Have modified discharge instruction. Bedtime snack would help alleviate mild morning hypoglycemia. SNF to monitor CBG's 4 times daily for now. Titrate Lantus by 2-4 units as needed if Am CBG stays higher than 180. Will recommend stopping Glimepiride. Hemoglobin A1c 10.5. Patient endorses noncompliance at home, had been off of insulin for about one month and also been off metformin for about one  month.   Hyperlipidemia  Patient also not compliant with Zocor at home. LDL 54. May resume at discharge.  Hypertension  Patient not compliant with all antihypertensives at home. Continue current metoprolol, lisinopril, amlodipine. Blood pressures elevated at times due to pain.   History of CAD with stent to Cx in 2004 Stable.  Epidural abscess  Status post surgical intervention. On antibiotics per ID. Management of pain per ortho.  Constipation  Continue Colace and Senokot. Has had BMs   Family Communication: Discussed with patient Disposition Plan: Plan is for SNF 1/18  Code Status Full Code  DVT Prophylaxis SCD's   LOS: 11 days   [facility]  [facility] Pager [phone] 06/06/2012, 9:24 AM  If 8PM-8AM, please contact night-coverage at [email or web address], password [facility]

## 2012-06-06 NOTE — Progress Notes (Signed)
Pt to be d/c today to [facility] in [place].   Pt and family agreeable. Confirmed plans with facility.  Plan transfer via EMS. Prior auth number given to ambulance for transportation.   Leron Croak, LCSWA [employer] [phone]

## 2012-06-06 NOTE — Progress Notes (Signed)
Pt transported to [facility] via [facility]...all paperwork sent with pt.

## 2012-06-07 LAB — ANAEROBIC CULTURE

## 2012-06-18 ENCOUNTER — Encounter: Payer: Self-pay | Admitting: Infectious Disease

## 2012-06-18 ENCOUNTER — Ambulatory Visit (INDEPENDENT_AMBULATORY_CARE_PROVIDER_SITE_OTHER): Payer: Medicare Other | Admitting: Infectious Disease

## 2012-06-18 VITALS — BP 111/67 | HR 94 | Temp 98.7°F | Wt 211.0 lb

## 2012-06-18 DIAGNOSIS — G062 Extradural and subdural abscess, unspecified: Secondary | ICD-10-CM

## 2012-06-18 DIAGNOSIS — L03314 Cellulitis of groin: Secondary | ICD-10-CM

## 2012-06-18 DIAGNOSIS — M519 Unspecified thoracic, thoracolumbar and lumbosacral intervertebral disc disorder: Secondary | ICD-10-CM

## 2012-06-18 DIAGNOSIS — I739 Peripheral vascular disease, unspecified: Secondary | ICD-10-CM

## 2012-06-18 DIAGNOSIS — L02219 Cutaneous abscess of trunk, unspecified: Secondary | ICD-10-CM

## 2012-06-18 DIAGNOSIS — M4640 Discitis, unspecified, site unspecified: Secondary | ICD-10-CM

## 2012-06-18 NOTE — Patient Instructions (Addendum)
Followup appt on February 29th at 11am

## 2012-06-18 NOTE — Progress Notes (Signed)
  Subjective:    Patient ID: Logan [name], male    DOB: 1945-10-02, 67 y.o.   MRN: [id number]  HPI  67 y.o. male with a history of PVD requiring numerous procedures including femoral-femoral bypass complicated by occlusion and thrombectomies with fasciotomies who presented to the ED initially with acute onset of back pain that started about 04/29/12 and CT at that time did not show any significant infectious findings, though was notable for L5-S1 neural compression. He then saw his PCP about 1-2 weeks later who apparently repeated an xray and noted abnormal findings (done in [place]). He was then referred to orthopedics, Dr. Darrelyn Hillock who performed an MRI and noted an epidural abscess and lumbar disc herniation. He has been on multiple courses of antibiotics since his first admission in August of 2012 for the graft including Bactrim, cipro and recently was taking cipro for a month.  He was managed with vancomycin and ceftriaxone until he ultimately underwent:  LUMBAR LAMINECTOMY/DECOMPRESSION MICRODISCECTOMY 1 LEVEL (N/A) - Central Decompression Lumbar Laminectomy L5-S1 for Epidural Abscess and Spinal Stenosis  He states wound in his left groin has improved over the last several weeks. His back pain however is he claims without improvement at all. He is without fevers chills nausea or malaise. He is currently still residing in a skilled nursing facility while receiving vancomycin and ceftriaxone.   Review of Systems  Constitutional: Negative for fever, chills, diaphoresis, activity change, appetite change, fatigue and unexpected weight change.  HENT: Negative for congestion, sore throat, rhinorrhea, sneezing, trouble swallowing and sinus pressure.   Eyes: Negative for photophobia and visual disturbance.  Respiratory: Negative for cough, chest tightness, shortness of breath, wheezing and stridor.   Cardiovascular: Negative for chest pain, palpitations and leg swelling.  Gastrointestinal: Negative for  nausea, vomiting, abdominal pain, diarrhea, constipation, blood in stool, abdominal distention and anal bleeding.  Genitourinary: Negative for dysuria, hematuria, flank pain and difficulty urinating.  Musculoskeletal: Positive for back pain. Negative for myalgias, joint swelling, arthralgias and gait problem.  Skin: Positive for wound. Negative for color change, pallor and rash.  Neurological: Negative for dizziness, tremors, weakness and light-headedness.  Hematological: Negative for adenopathy. Does not bruise/bleed easily.  Psychiatric/Behavioral: Negative for behavioral problems, confusion, sleep disturbance, dysphoric mood, decreased concentration and agitation.       Objective:   Physical Exam  Constitutional: He is oriented to person, place, and time. He appears well-developed and well-nourished. No distress.  HENT:  Head: Normocephalic and atraumatic.  Mouth/Throat: Oropharynx is clear and moist.  Eyes: Conjunctivae normal and EOM are normal. No scleral icterus.  Neck: Normal range of motion. Neck supple.  Cardiovascular: Normal rate and regular rhythm.  Exam reveals friction rub.   Pulmonary/Chest: Effort normal and breath sounds normal. No respiratory distress.  Abdominal: He exhibits no distension.  Musculoskeletal:       Arms: Neurological: He is alert and oriented to person, place, and time. He exhibits normal muscle tone.  Skin: Skin is warm and dry. He is not diaphoretic. No pallor.     Psychiatric: He has a normal mood and affect. His behavior is normal. Judgment and thought content normal.          Assessment & Plan:  Epidural abscess with diskitis: Continue ceftriaxone and vancomycin to finish at least 6 weeks of therapy with reevaluation in clinic with check of her sedimentation rate and C-reactive protein at that time.  Surgical site infection in left groin: Improved.

## 2012-06-19 ENCOUNTER — Telehealth: Payer: Self-pay | Admitting: *Deleted

## 2012-06-19 NOTE — Telephone Encounter (Signed)
Skilled Nursing Facility called stating that his picc line is clogged and there is no one there that can administer cath flow. They called [facility] and no one can replace the picc until Monday. I suggested that she call the hospital back and find out if he goes to the ED will they be able to administer cath flow there. Wendall Mola CMA

## 2012-06-19 NOTE — Telephone Encounter (Signed)
They should be able to do this in ED

## 2012-07-08 ENCOUNTER — Ambulatory Visit (INDEPENDENT_AMBULATORY_CARE_PROVIDER_SITE_OTHER): Payer: Medicare Other | Admitting: Infectious Disease

## 2012-07-08 ENCOUNTER — Encounter: Payer: Self-pay | Admitting: Infectious Disease

## 2012-07-08 ENCOUNTER — Ambulatory Visit: Payer: Medicare Other | Admitting: Infectious Disease

## 2012-07-08 VITALS — BP 119/73 | HR 85 | Temp 98.6°F | Ht 69.0 in | Wt 215.0 lb

## 2012-07-08 DIAGNOSIS — M4627 Osteomyelitis of vertebra, lumbosacral region: Secondary | ICD-10-CM

## 2012-07-08 DIAGNOSIS — A4902 Methicillin resistant Staphylococcus aureus infection, unspecified site: Secondary | ICD-10-CM

## 2012-07-08 DIAGNOSIS — L0292 Furuncle, unspecified: Secondary | ICD-10-CM

## 2012-07-08 DIAGNOSIS — M869 Osteomyelitis, unspecified: Secondary | ICD-10-CM

## 2012-07-08 DIAGNOSIS — T8140XA Infection following a procedure, unspecified, initial encounter: Secondary | ICD-10-CM

## 2012-07-08 LAB — SEDIMENTATION RATE: Sed Rate: 55 mm/hr — ABNORMAL HIGH (ref 0–16)

## 2012-07-08 LAB — C-REACTIVE PROTEIN: CRP: 2.1 mg/dL — ABNORMAL HIGH (ref <0.60)

## 2012-07-08 NOTE — Progress Notes (Signed)
Subjective:    Patient ID: Logan [name], male    DOB: [birth year], 67 y.o.   MRN: [id number]  Back Pain Associated symptoms include weakness. Pertinent negatives include no abdominal pain, chest pain, dysuria or fever.    67 y.o. male with a history of PVD requiring numerous procedures including femoral-femoral bypass complicated by occlusion and thrombectomies with fasciotomies who presented to the ED initially with acute onset of back pain that started about 04/29/12 and CT at that time did not show any significant infectious findings, though was notable for L5-S1 neural compression. He then saw his PCP about 1-2 weeks later who apparently repeated an xray and noted abnormal findings (done in [place]). He was then referred to orthopedics, Dr. Darrelyn Hillock who performed an MRI and noted an epidural abscess and lumbar disc herniation. He has been on multiple courses of antibiotics since his first admission in August of 2012 for the graft  He was managed with vancomycin and ceftriaxone as an inpatient  until he ultimately underwent:   LUMBAR LAMINECTOMY/DECOMPRESSION MICRODISCECTOMY 1 LEVEL (N/A) - Central Decompression Lumbar Laminectomy L5-S1 for Epidural Abscess and Spinal Stenosis  On January 14th and has continued on antibiotics since then. His back pain has improved dramatically although still has low back pain with exertion. He has difficulty getting a wheelchair without assistance.  He has developed a new boil in his groin and suprapubic area. This apparently was a "coffee cup-sized boil before it began spontaneously draining bloody material. It seemed highly consistent with MRSA abscess.      Review of Systems  Constitutional: Negative for fever, chills, diaphoresis, activity change, appetite change, fatigue and unexpected weight change.  HENT: Negative for congestion, sore throat, rhinorrhea, sneezing, trouble swallowing and sinus pressure.   Eyes: Negative for photophobia and visual  disturbance.  Respiratory: Negative for cough, chest tightness, shortness of breath, wheezing and stridor.   Cardiovascular: Negative for chest pain, palpitations and leg swelling.  Gastrointestinal: Negative for nausea, vomiting, abdominal pain, diarrhea, constipation, blood in stool, abdominal distention and anal bleeding.  Genitourinary: Negative for dysuria, hematuria, flank pain and difficulty urinating.  Musculoskeletal: Positive for back pain and gait problem. Negative for myalgias, joint swelling and arthralgias.  Skin: Positive for color change. Negative for pallor, rash and wound.  Neurological: Positive for tremors and weakness. Negative for dizziness and light-headedness.  Hematological: Negative for adenopathy. Does not bruise/bleed easily.  Psychiatric/Behavioral: Negative for behavioral problems, confusion, sleep disturbance, dysphoric mood, decreased concentration and agitation.       Objective:   Physical Exam  Constitutional: He is oriented to person, place, and time. No distress.  HENT:  Head: Normocephalic and atraumatic.  Mouth/Throat: Oropharynx is clear and moist.  Eyes: Conjunctivae and EOM are normal. No scleral icterus.  Neck: Normal range of motion. Neck supple.  Cardiovascular: Normal rate, regular rhythm and normal heart sounds.  Exam reveals no gallop and no friction rub.   No murmur heard. Pulmonary/Chest: Effort normal and breath sounds normal. No respiratory distress. He has no wheezes. He has no rales.  Abdominal: He exhibits no distension. There is no tenderness. There is no rebound.  Musculoskeletal: He exhibits edema. He exhibits no tenderness.  Neurological: He is alert and oriented to person, place, and time.  Skin: Skin is warm and dry. He is not diaphoretic. No pallor.     Psychiatric: He has a normal mood and affect. His behavior is normal. Judgment and thought content normal.  Assessment & Plan:  Epidural abscess with diskitis:  Continue ceftriaxone and vancomycin to finish at least 6 weeks of therapy  Will recheck reevaluation sedimentation rate and C-reactive protein at that time.   Boil; likely due to MRSA. He is covered with vancomycin for this at present. May need I and D but this may also drain with warm compresses. Re-evaluate next week  Surgical site infection in left groin:Resolved

## 2012-07-08 NOTE — Patient Instructions (Addendum)
PLEASE SCHEDULE Logan [name] FOR NEXT WED AT 230 FOR HALF HOUR SLOT  AT [facility] HE WILL NEED WARM COMPRESSES APPLIED TO GROIN FOUR TIMES A DAY

## 2012-07-10 ENCOUNTER — Telehealth: Payer: Self-pay | Admitting: *Deleted

## 2012-07-10 NOTE — Telephone Encounter (Signed)
Pam Cumpton called from the facility where the patient is staying to advise that the patients insurance has run out (07/08/12) and therefore he is paying out of pocket for his meds and inpatient bills. She was asking if the patient could switch to oral and be released to home as the physical therpast there advised he is ok to be released and the only reason he is inpatient is because of the IV medication (no one home to help with BID dosing). Advised her will inform the doctor and give her a call back asap.   Pam Cumpton [phone]

## 2012-07-10 NOTE — Telephone Encounter (Signed)
He is NEARLY thru with 6 weeks of IV abx. He could be changed to oral doxycyline 100mg  twice daily until I have re-assessed him. He has a boil that may need to be I and D'd by me or a Careers adviser. I dont understand why this wasn't brought up at his visit!

## 2012-07-10 NOTE — Telephone Encounter (Signed)
Orders faxed to SNF: pull PICC, initiate Doxycycline 100mg  BID x 4 weeks, apply warm compresses to boil in groin 4x day, follow up at [facility] 07/15/12 at 2:30.  Patient will be d/c to home by their attending physician d/t insurance max reached ("100th day"). Andree Coss, RN

## 2012-07-15 ENCOUNTER — Encounter: Payer: Self-pay | Admitting: Infectious Disease

## 2012-07-15 ENCOUNTER — Ambulatory Visit (INDEPENDENT_AMBULATORY_CARE_PROVIDER_SITE_OTHER): Payer: Medicare Other | Admitting: Infectious Disease

## 2012-07-15 VITALS — BP 116/72 | HR 91 | Temp 98.0°F | Ht 69.0 in | Wt 215.0 lb

## 2012-07-15 DIAGNOSIS — L0292 Furuncle, unspecified: Secondary | ICD-10-CM

## 2012-07-15 DIAGNOSIS — A4902 Methicillin resistant Staphylococcus aureus infection, unspecified site: Secondary | ICD-10-CM

## 2012-07-15 DIAGNOSIS — L0293 Carbuncle, unspecified: Secondary | ICD-10-CM

## 2012-07-15 DIAGNOSIS — G062 Extradural and subdural abscess, unspecified: Secondary | ICD-10-CM

## 2012-07-15 MED ORDER — DOXYCYCLINE HYCLATE 100 MG PO TABS: 100.0000 mg | ORAL_TABLET | Freq: Two times a day (BID) | ORAL | Status: DC

## 2012-07-15 NOTE — Patient Instructions (Signed)
2nd week of April

## 2012-07-15 NOTE — Progress Notes (Signed)
Subjective:    Patient ID: Logan [name], male    DOB: 29-Sep-1945, 67 y.o.   MRN: [id number]  HPI  67 y.o. male with a history of PVD requiring numerous procedures including femoral-femoral bypass complicated by occlusion and thrombectomies with fasciotomies who presented to the ED initially with acute onset of back pain that started about 04/29/12 and CT at that time did not show any significant infectious findings, though was notable for L5-S1 neural compression. He then saw his PCP about 1-2 weeks later who apparently repeated an xray and noted abnormal findings (done in [place]). He was then referred to orthopedics, Dr. Darrelyn Hillock who performed an MRI and noted an epidural abscess and lumbar disc herniation. He has been on multiple courses of antibiotics since his first admission in August of 2012 for the graft   He was managed with vancomycin and ceftriaxone as an inpatient until he ultimately underwent:   LUMBAR LAMINECTOMY/DECOMPRESSION MICRODISCECTOMY 1 LEVEL (N/A) - Central Decompression Lumbar Laminectomy L5-S1 for Epidural Abscess and Spinal Stenosis   On January 14th and has continued on antibiotics since then. His back pain has improved dramatically although still has low back pain with exertion. He had difficulty getting a wheelchair without assistance.   When I last saw him he had developed a new boil in his groin and suprapubic area. This apparently was a "coffee cup-sized boil before it began spontaneously draining bloody material. It seemed highly consistent with MRSA abscess. Since then we took him off his vancomycin and rocephin just a few days prior to completing 42 days because he was having to pay out of pocket for futher stay in SNF. I had him switch to doxycycline instead though he did not start this until Tuesday. He has been applying warm compresses to his groin and the lesion is decreasing in size.      Review of Systems  Constitutional: Negative for fever, chills,  diaphoresis, activity change, appetite change, fatigue and unexpected weight change.  HENT: Negative for congestion, sore throat, rhinorrhea, sneezing, trouble swallowing and sinus pressure.   Eyes: Negative for photophobia and visual disturbance.  Respiratory: Negative for cough, chest tightness, shortness of breath, wheezing and stridor.   Cardiovascular: Negative for chest pain, palpitations and leg swelling.  Gastrointestinal: Negative for nausea, vomiting, abdominal pain, diarrhea, constipation, blood in stool, abdominal distention and anal bleeding.  Genitourinary: Negative for dysuria, hematuria, flank pain and difficulty urinating.  Musculoskeletal: Positive for back pain and gait problem. Negative for myalgias, joint swelling and arthralgias.  Skin: Positive for color change. Negative for pallor, rash and wound.  Neurological: Positive for tremors and weakness. Negative for dizziness and light-headedness.  Hematological: Negative for adenopathy. Does not bruise/bleed easily.  Psychiatric/Behavioral: Negative for behavioral problems, confusion, sleep disturbance, dysphoric mood, decreased concentration and agitation.       Objective:   Physical Exam  Constitutional: He is oriented to person, place, and time. No distress.  HENT:  Head: Normocephalic and atraumatic.  Mouth/Throat: Oropharynx is clear and moist.  Eyes: Conjunctivae and EOM are normal. No scleral icterus.  Neck: Normal range of motion. Neck supple.  Cardiovascular: Normal rate, regular rhythm and normal heart sounds.  Exam reveals no gallop and no friction rub.   No murmur heard. Pulmonary/Chest: Effort normal and breath sounds normal. No respiratory distress. He has no wheezes. He has no rales.  Abdominal: He exhibits no distension. There is no tenderness. There is no rebound.  Musculoskeletal: He exhibits edema. He exhibits no tenderness.  Neurological: He is alert and oriented to person, place, and time.  Skin:  Skin is warm and dry. He is not diaphoretic. No pallor.     Psychiatric: He has a normal mood and affect. His behavior is normal. Judgment and thought content normal.          Assessment & Plan:  Epidural abscess with diskitis: Finished nearly 6 weeks  Of  ceftriaxone and vancomycin. ESR and CRP are comign down. Will continue doxycycline (see below) for until  with fu with  [place] in ID (he wishes to come  To [facility])  ?Boil; likely due to MRSA. Reducing in size. Continue warm compresses. What is palpable on exame today Appears too deep for me to approach. Could potentially be residual enlarged LN. Will  Be continuing doxycycline until we have re-evaluted him next month  Surgical site infection in left groin:Resolved

## 2012-07-27 ENCOUNTER — Encounter: Payer: Self-pay | Admitting: Vascular Surgery

## 2012-07-28 ENCOUNTER — Encounter (INDEPENDENT_AMBULATORY_CARE_PROVIDER_SITE_OTHER): Payer: Medicare Other | Admitting: *Deleted

## 2012-07-28 ENCOUNTER — Ambulatory Visit: Payer: Medicare Other | Admitting: Vascular Surgery

## 2012-07-28 ENCOUNTER — Ambulatory Visit (INDEPENDENT_AMBULATORY_CARE_PROVIDER_SITE_OTHER): Payer: Medicare Other | Admitting: Vascular Surgery

## 2012-07-28 ENCOUNTER — Encounter: Payer: Self-pay | Admitting: Vascular Surgery

## 2012-07-28 VITALS — BP 142/83 | HR 83 | Resp 20 | Ht 69.0 in | Wt 215.0 lb

## 2012-07-28 DIAGNOSIS — Z48812 Encounter for surgical aftercare following surgery on the circulatory system: Secondary | ICD-10-CM

## 2012-07-28 DIAGNOSIS — I70219 Atherosclerosis of native arteries of extremities with intermittent claudication, unspecified extremity: Secondary | ICD-10-CM

## 2012-07-28 DIAGNOSIS — I739 Peripheral vascular disease, unspecified: Secondary | ICD-10-CM

## 2012-07-28 NOTE — Progress Notes (Signed)
Subjective:     Patient ID: Logan [name], male   DOB: 07-14-1945, 67 y.o.   MRN: [id number]  HPIthis 67 year old male returns for continued followup regarding his left right femoral-femoral bypass with known occlusion of right limb of aortobifemoral graft. He did have an open sinus tract in the left inguinal area a few months ago. This has subsequently closed he has had no history of chills fever or drainage over the last several weeks. He has recently had lumbar spine surgery performed by Dr. Darrelyn Hillock who is following him. The patient does not ambulate well because of his back problems. He has had some edema in both lower extremities but denies rest pain in both feet. Emulates with a walker.  Past Medical History  Diagnosis Date  . Overweight   . Dyslipidemia   . Cardiomyopathy   . Diabetes mellitus   . PVD (peripheral vascular disease)     right to left fem -fem bypass 1997/ aortabifemoral bypass 2005 right to left fem-fem 07/2004. Secondary to occluded righ tlimb of aortobifemoral bypass  . Tobacco abuse   . Anginal pain   . Hypertension   . Shortness of breath   . Coronary artery disease     taxis DES stent circumflex 02/2003.... residual total distal circumflex and 50% LAD/ cardiolite 08/2006 inferior scar no ischemia  . Degenerative disc disease, lumbar     History  Substance Use Topics  . Smoking status: Former Smoker -- 40 years    Types: Cigarettes    Quit date: 12/20/2011  . Smokeless tobacco: Never Used  . Alcohol Use: Yes     Comment: liquior sometimes    Family History  Problem Relation Age of Onset  . Hypertension Mother     Allergies  Allergen Reactions  . Aspirin Hives  . Penicillins Rash and Other (See Comments)    Immune system shut down     Current outpatient prescriptions:amLODipine (NORVASC) 2.5 MG tablet, Take 2.5 mg by mouth every morning. , Disp: , Rfl: ;  aspirin EC 81 MG tablet, Take 81 mg by mouth every morning. , Disp: , Rfl: ;  clopidogrel  (PLAVIX) 75 MG tablet, Take 1 tablet (75 mg total) by mouth daily with breakfast., Disp: , Rfl: ;  diazepam (VALIUM) 5 MG tablet, Take 5 mg by mouth 2 (two) times daily as needed. Back spasms., Disp: , Rfl:  doxycycline (VIBRA-TABS) 100 MG tablet, Take 1 tablet (100 mg total) by mouth 2 (two) times daily., Disp: 60 tablet, Rfl: 1;  indomethacin (INDOCIN) 50 MG capsule, Take 50 mg by mouth daily as needed. Gout flare-up., Disp: , Rfl: ;  insulin aspart (NOVOLOG) 100 UNIT/ML injection, CBG 70 - 120: 0 units CBG 121 - 150: 2 units CBG 151 - 200: 3 units CBG 201 - 250: 5 units CBG 251 - 300: 8 units  Call MD if CBg is greater than 200, Disp: 1 vial, Rfl: 0 insulin glargine (LANTUS) 100 UNIT/ML injection, Inject 4 Units into the skin daily., Disp: 10 mL, Rfl: 12;  lisinopril (PRINIVIL,ZESTRIL) 40 MG tablet, Take 40 mg by mouth every morning. , Disp: , Rfl: ;  metFORMIN (GLUCOPHAGE) 500 MG tablet, Take 500 mg by mouth 2 (two) times daily with a meal. , Disp: , Rfl: ;  methocarbamol (ROBAXIN) 500 MG tablet, Take 1 tablet (500 mg total) by mouth every 6 (six) hours as needed., Disp: 40 tablet, Rfl: 1 metoprolol (LOPRESSOR) 50 MG tablet, Take 50 mg by mouth 2 (two) times  daily., Disp: , Rfl: ;  Multiple Vitamins-Minerals (MULTIVITAMIN PO), Take 1 tablet by mouth every morning. , Disp: , Rfl: ;  Omega-3 Fatty Acids (FISH OIL) 1000 MG CAPS, Take 1 capsule by mouth 2 (two) times daily. , Disp: , Rfl: ;  oxyCODONE-acetaminophen (PERCOCET) 10-325 MG per tablet, , Disp: , Rfl:  polyethylene glycol (MIRALAX / GLYCOLAX) packet, Take 17 g by mouth daily as needed., Disp: 14 each, Rfl: 0;  simvastatin (ZOCOR) 20 MG tablet, Take 20 mg by mouth every evening., Disp: , Rfl:   BP 142/83  Pulse 83  Resp 20  Ht 5\' 9"  (1.753 m)  Wt 215 lb (97.523 kg)  BMI 31.74 kg/m2  Body mass index is 31.74 kg/(m^2).           Review of SystemsHas multiple complaints including generalized weakness, dyspnea on exertion, instability  with gait, chronic back pain,     Objective:   Physical Exam blood pressure 142/83 heart rate 83 respirations 20 General chronically ill-appearing male in no apparent distress moves very slowly-alert and oriented x3 Lungs no rhonchi or wheezing Cardiovascular regular rhythm no murmurs Abdomen obese no palpable masses 3+ femoral pulses palpable bilaterally. No pedal pulses palpable. Both feet with 1+ edema well perfused.  I ordered a duplex scan of the femoral-femoral graft which reveals it to be widely patent with no evidence of stenosis.      Assessment:     Widely patent left-to-right femoral-femoral bypass with complete healing of left inguinal area-no draining sinus tract or inflammation Severe chronic degenerative joint disease in spine  Causing chronic back discomfort and leg pain     Plan:     Plan see patient back on when necessary basis

## 2012-08-12 ENCOUNTER — Encounter: Payer: Self-pay | Admitting: Infectious Disease

## 2012-08-12 ENCOUNTER — Ambulatory Visit (INDEPENDENT_AMBULATORY_CARE_PROVIDER_SITE_OTHER): Payer: Medicare Other | Admitting: Infectious Disease

## 2012-08-12 ENCOUNTER — Other Ambulatory Visit: Payer: Self-pay | Admitting: Infectious Disease

## 2012-08-12 VITALS — BP 154/87 | HR 78 | Temp 98.3°F | Wt 215.0 lb

## 2012-08-12 DIAGNOSIS — K611 Rectal abscess: Secondary | ICD-10-CM

## 2012-08-12 DIAGNOSIS — I739 Peripheral vascular disease, unspecified: Secondary | ICD-10-CM

## 2012-08-12 DIAGNOSIS — M4627 Osteomyelitis of vertebra, lumbosacral region: Secondary | ICD-10-CM

## 2012-08-12 DIAGNOSIS — A4902 Methicillin resistant Staphylococcus aureus infection, unspecified site: Secondary | ICD-10-CM

## 2012-08-12 DIAGNOSIS — M869 Osteomyelitis, unspecified: Secondary | ICD-10-CM

## 2012-08-12 DIAGNOSIS — L03319 Cellulitis of trunk, unspecified: Secondary | ICD-10-CM

## 2012-08-12 DIAGNOSIS — L02214 Cutaneous abscess of groin: Secondary | ICD-10-CM

## 2012-08-12 DIAGNOSIS — L0291 Cutaneous abscess, unspecified: Secondary | ICD-10-CM

## 2012-08-12 DIAGNOSIS — B9562 Methicillin resistant Staphylococcus aureus infection as the cause of diseases classified elsewhere: Secondary | ICD-10-CM

## 2012-08-12 DIAGNOSIS — K612 Anorectal abscess: Secondary | ICD-10-CM

## 2012-08-12 LAB — BASIC METABOLIC PANEL WITH GFR
BUN: 12 mg/dL (ref 6–23)
Creat: 1.12 mg/dL (ref 0.50–1.35)
GFR, Est African American: 78 mL/min
GFR, Est Non African American: 67 mL/min
Potassium: 4.4 mEq/L (ref 3.5–5.3)

## 2012-08-12 LAB — CBC WITH DIFFERENTIAL/PLATELET
Basophils Absolute: 0 10*3/uL (ref 0.0–0.1)
Basophils Relative: 0 % (ref 0–1)
MCHC: 33.2 g/dL (ref 30.0–36.0)
Neutro Abs: 3.6 10*3/uL (ref 1.7–7.7)
Neutrophils Relative %: 44 % (ref 43–77)
RDW: 15.2 % (ref 11.5–15.5)

## 2012-08-12 LAB — C-REACTIVE PROTEIN: CRP: 1 mg/dL — ABNORMAL HIGH (ref <0.60)

## 2012-08-12 MED ORDER — CHLORHEXIDINE GLUCONATE 4 % EX LIQD: CUTANEOUS | Status: DC

## 2012-08-12 MED ORDER — DOXYCYCLINE HYCLATE 100 MG PO TABS: 100.0000 mg | ORAL_TABLET | Freq: Two times a day (BID) | ORAL | Status: DC

## 2012-08-12 MED ORDER — MUPIROCIN 2 % EX OINT: TOPICAL_OINTMENT | CUTANEOUS | Status: DC

## 2012-08-12 MED ORDER — SACCHAROMYCES BOULARDII 250 MG PO CAPS: 250.0000 mg | ORAL_CAPSULE | Freq: Two times a day (BID) | ORAL | Status: DC

## 2012-08-12 NOTE — Progress Notes (Signed)
Subjective:    Patient ID: Logan [name], male    DOB: [birth year], 67 y.o.   MRN: [id number]  HPI   67 y.o. male with a history of PVD requiring numerous procedures including femoral-femoral bypass complicated by occlusion and thrombectomies with fasciotomies who presented to the ED initially with acute onset of back pain that started about 04/29/12 and CT at that time did not show any significant infectious findings, though was notable for L5-S1 neural compression. He then saw his PCP about 1-2 weeks later who apparently repeated an xray and noted abnormal findings (done in [place]). He was then referred to orthopedics, Dr. Darrelyn Hillock who performed an MRI and noted an epidural abscess and lumbar disc herniation. He has been on multiple courses of antibiotics since his first admission in August of 2012 for the graft   He was managed with vancomycin and ceftriaxone as an inpatient until he ultimately underwent:   LUMBAR LAMINECTOMY/DECOMPRESSION MICRODISCECTOMY 1 LEVEL (N/A) - Central Decompression Lumbar Laminectomy L5-S1 for Epidural Abscess and Spinal Stenosis   On January 14th and has continued on antibiotics since then. His back pain has improved dramatically although still has low back pain with exertion. He had difficulty getting a wheelchair without assistance.   He had developed a new boil in his groin and suprapubic area. This apparently was a "coffee cup-sized boil before it began spontaneously draining bloody material. It seemed highly consistent with MRSA abscess. Since then we took him off his vancomycin and rocephin just a few days prior to completing 42 days because he was having to pay out of pocket for futher stay in SNF. I had him switch to doxycycline instead though he did not start this until Tuesday. He has been applying warm compresses to his groin and the lesion is decreasing in size.  His lesion in groin is all but he then developed a perirectal area that became fluctuant. He been  applying warm compresses to this the past month and it drained at times but then became larger.` No fevers chills or other systemic symptoms.      Review of Systems  Constitutional: Negative for fever, chills, diaphoresis, activity change, appetite change, fatigue and unexpected weight change.  HENT: Negative for congestion, sore throat, rhinorrhea, sneezing, trouble swallowing and sinus pressure.   Eyes: Negative for photophobia and visual disturbance.  Respiratory: Negative for cough, chest tightness, shortness of breath, wheezing and stridor.   Cardiovascular: Negative for chest pain, palpitations and leg swelling.  Gastrointestinal: Negative for nausea, vomiting, abdominal pain, diarrhea, constipation, blood in stool, abdominal distention and anal bleeding.  Genitourinary: Negative for dysuria, hematuria, flank pain and difficulty urinating.  Musculoskeletal: Positive for back pain and gait problem. Negative for myalgias, joint swelling and arthralgias.  Skin: Positive for color change. Negative for pallor, rash and wound.  Neurological: Positive for weakness. Negative for dizziness, tremors and light-headedness.  Hematological: Negative for adenopathy. Does not bruise/bleed easily.  Psychiatric/Behavioral: Negative for behavioral problems, confusion, sleep disturbance, dysphoric mood, decreased concentration and agitation.       Objective:   Physical Exam  Constitutional: He is oriented to person, place, and time. No distress.  HENT:  Head: Normocephalic and atraumatic.  Mouth/Throat: Oropharynx is clear and moist.  Eyes: Conjunctivae and EOM are normal. No scleral icterus.  Neck: Normal range of motion. Neck supple.  Cardiovascular: Normal rate, regular rhythm and normal heart sounds.  Exam reveals no gallop and no friction rub.   No murmur heard. Pulmonary/Chest: Effort normal  and breath sounds normal. No respiratory distress. He has no wheezes. He has no rales.  Abdominal: He  exhibits no distension. There is no tenderness. There is no rebound.  Genitourinary:     Musculoskeletal: He exhibits edema. He exhibits no tenderness.  Neurological: He is alert and oriented to person, place, and time.  Skin: Skin is warm and dry. He is not diaphoretic. No pallor.     Psychiatric: He has a normal mood and affect. His behavior is normal. Judgment and thought content normal.          Assessment & Plan:  Epidural abscess with diskitis: Finished nearly 6 weeks  Of  ceftriaxone and vancomycin. ESR and CRP  comign down. Will continue doxycycline (see below) and recheck esr and crp today  Perirectal abscess; likely due to MRSA. Performed incision and debridement and cultures were obtained will continue on doxycycline pending culture results  Surgical site infection in left groin:Resolved  Likely MRSA colonization: M.D. Hibiclens baths and mupirocin intranasally after week of doxycycline therapy.  Procedure: Informed consent was obtained the patient after risks and benefits were thoroughly explained.  Description: The perirectal area was cleansed with Betadine ointment to achieve antiseptic affect. Lidocaine spray was used followed by 1% lidocaine was infiltrated into the perirectal soft tissue near the exit wound of his abscess. Some lidocaine did come back out through the exit wound. We applied a second syringe full of lidocaine 1% to achieve optimal anesthesia. Incision was made with a blade and largely bloody material then came from the wound. Note I earlier milked purulent material from this wound. Cultures were taken from initially purulent material was encountered bimanual manipulation of the wound  EBL: minimal  Complications: none

## 2012-08-12 NOTE — Patient Instructions (Addendum)
Continue the doxycycline for one month  In one week start hibiclens 4% shower head to toe once nightly for 7 days  Also a that time  Start mupirocin ointment inside nostrils twice dailiy for 7 days

## 2012-08-12 NOTE — Addendum Note (Signed)
Addended bySteva Colder on: 08/12/2012 03:13 PM   Modules accepted: Orders

## 2012-08-15 LAB — WOUND CULTURE

## 2012-08-31 ENCOUNTER — Ambulatory Visit: Payer: Medicare Other | Admitting: Infectious Diseases

## 2012-09-09 ENCOUNTER — Encounter: Payer: Self-pay | Admitting: Infectious Disease

## 2012-09-09 ENCOUNTER — Ambulatory Visit (INDEPENDENT_AMBULATORY_CARE_PROVIDER_SITE_OTHER): Payer: Medicare Other | Admitting: Infectious Disease

## 2012-09-09 VITALS — BP 154/83 | HR 79 | Temp 98.0°F | Wt 215.0 lb

## 2012-09-09 DIAGNOSIS — Z5189 Encounter for other specified aftercare: Secondary | ICD-10-CM

## 2012-09-09 DIAGNOSIS — M519 Unspecified thoracic, thoracolumbar and lumbosacral intervertebral disc disorder: Secondary | ICD-10-CM

## 2012-09-09 DIAGNOSIS — M4640 Discitis, unspecified, site unspecified: Secondary | ICD-10-CM

## 2012-09-09 DIAGNOSIS — G062 Extradural and subdural abscess, unspecified: Secondary | ICD-10-CM

## 2012-09-09 DIAGNOSIS — L0292 Furuncle, unspecified: Secondary | ICD-10-CM

## 2012-09-09 NOTE — Progress Notes (Signed)
Subjective:    Patient ID: Logan [name], male    DOB: [birth year], 67 y.o.   MRN: [id number]  HPI   67 y.o. male with a history of PVD requiring numerous procedures including femoral-femoral bypass complicated by occlusion and thrombectomies with fasciotomies who presented to the ED initially with acute onset of back pain that started about 04/29/12 and CT at that time did not show any significant infectious findings, though was notable for L5-S1 neural compression. He then saw his PCP about 1-2 weeks later who apparently repeated an xray and noted abnormal findings (done in [place]). He was then referred to orthopedics, Dr. Darrelyn Hillock who performed an MRI and noted an epidural abscess and lumbar disc herniation. He has been on multiple courses of antibiotics since his first admission in August of 2012 for the graft   He was managed with vancomycin and ceftriaxone as an inpatient until he ultimately underwent:   LUMBAR LAMINECTOMY/DECOMPRESSION MICRODISCECTOMY 1 LEVEL (N/A) - Central Decompression Lumbar Laminectomy L5-S1 for Epidural Abscess and Spinal Stenosis   On January 14th and has continued on antibiotics since then. His back pain has improved dramatically although still has low back pain with exertion. He had difficulty getting a wheelchair without assistance.   He had developed a new boil in his groin and suprapubic area. This apparently was a "coffee cup-sized boil before it began spontaneously draining bloody material. It seemed highly consistent with MRSA abscess. Since then we took him off his vancomycin and rocephin just a few days prior to completing 42 days because he was having to pay out of pocket for futher stay in SNF. I had him switch to doxycycline instead though he did not start this until Tuesday. He hadbeen applying warm compresses to his groin and the lesion is decreasing in size.  His lesion in groin had resolved  but he then developed a perirectal area that became fluctuant. He  been applying warm compresses.  I performed I and D and continued his doxy and culture yielded diptheroids,. The lesion went down and then came back up again but is not especially large. I offered repeat I and D vs referral to [facility]. He prefers no I and D for now so we will ask him to apply warm compresses which he HAS not been doing regularly this past month.   No fevers chills or other systemic symptoms.      Review of Systems  Constitutional: Negative for fever, chills, diaphoresis, activity change, appetite change, fatigue and unexpected weight change.  HENT: Negative for congestion, sore throat, rhinorrhea, sneezing, trouble swallowing and sinus pressure.   Eyes: Negative for photophobia and visual disturbance.  Respiratory: Negative for cough, chest tightness, shortness of breath, wheezing and stridor.   Cardiovascular: Negative for chest pain, palpitations and leg swelling.  Gastrointestinal: Negative for nausea, vomiting, abdominal pain, diarrhea, constipation, blood in stool, abdominal distention and anal bleeding.  Genitourinary: Negative for dysuria, hematuria, flank pain and difficulty urinating.  Musculoskeletal: Positive for back pain and gait problem. Negative for myalgias, joint swelling and arthralgias.  Skin: Positive for color change. Negative for pallor, rash and wound.  Neurological: Positive for weakness. Negative for dizziness, tremors and light-headedness.  Hematological: Negative for adenopathy. Does not bruise/bleed easily.  Psychiatric/Behavioral: Negative for behavioral problems, confusion, sleep disturbance, dysphoric mood, decreased concentration and agitation.       Objective:   Physical Exam  Constitutional: He is oriented to person, place, and time. No distress.  HENT:  Head: Normocephalic  and atraumatic.  Mouth/Throat: Oropharynx is clear and moist.  Eyes: Conjunctivae and EOM are normal. No scleral icterus.  Neck: Normal range of motion. Neck supple.   Cardiovascular: Normal rate, regular rhythm and normal heart sounds.  Exam reveals no gallop and no friction rub.   No murmur heard. Pulmonary/Chest: Effort normal and breath sounds normal. No respiratory distress. He has no wheezes. He has no rales.  Abdominal: He exhibits no distension. There is no tenderness. There is no rebound.  Genitourinary:     Musculoskeletal: He exhibits edema. He exhibits no tenderness.  Neurological: He is alert and oriented to person, place, and time.  Skin: Skin is warm and dry. He is not diaphoretic. No pallor.     Psychiatric: He has a normal mood and affect. His behavior is normal. Judgment and thought content normal.          Assessment & Plan:  Epidural abscess with diskitis: Finished nearly 6 weeks  Of  ceftriaxone and vancomycin. ESR and CRP  Came own.Doxy was continued even further for several months.  Will observe off abx rtc in one month  Perirectal abscess; sp I and D, followup in one month apply warm compresses. No more abx for now come back if this is getting large int he meantime for I and D if needed.  Surgical site infection in left groin:Resolved  Likely MRSA colonization: M.D. Hibiclens baths and mupirocin intranasally  In future\

## 2012-09-28 ENCOUNTER — Ambulatory Visit (INDEPENDENT_AMBULATORY_CARE_PROVIDER_SITE_OTHER): Payer: Medicare Other | Admitting: Internal Medicine

## 2012-09-28 ENCOUNTER — Encounter: Payer: Self-pay | Admitting: Internal Medicine

## 2012-09-28 VITALS — BP 155/78 | HR 78 | Temp 98.6°F

## 2012-09-28 DIAGNOSIS — M4626 Osteomyelitis of vertebra, lumbar region: Secondary | ICD-10-CM

## 2012-09-28 DIAGNOSIS — M869 Osteomyelitis, unspecified: Secondary | ICD-10-CM

## 2012-09-28 NOTE — Progress Notes (Signed)
[facility] Note  RFV: follow up for osteomyelitis/diskitis/epidural abscess Subjective:    Patient ID: Logan [name], male    DOB: [birth year], 67 y.o.   MRN: [id number]  HPI 67 y.o. male with a history of PVD s/p femoral-femoral bypass c/b occlusion and thrombectomies with fasciotomies, gfart infection in Aug 2012, who presented to the ED with acute onset of back pain that started about 04/29/12 and CT at that time did not show any significant infectious findings, though was notable for L5-S1 neural compression. He then saw his PCP about 1-2 weeks later who apparently repeated an xray and noted abnormal findings (done in [place]). He was then referred to orthopedics, Dr. Darrelyn Hillock who performed an MRI and noted an epidural abscess and lumbar disc herniation. He is s/p Decompression Lumbar Laminectomy L5-S1 for Epidural Abscess and Spinal Stenosis On January 14th, continued on vancomycin and ceftriaxone where he finished 42 days  of therapy adn then transitioned to many months of doxycycline. He was last seen by Dr. Daiva Eves on 3/26 where he underwent an office based I x D for a perirectal boil. Patient had been on using hot compresses and was on doxycycline at that time.  Cultures from I X D on 3/26  Yielded few diptheroids. ESR trended down from 55 to 29 at that time. At his last appt in mid April, roughly 14 wks out from his back surgery, the plan was to observe off of antibiotics to see how is patient is doing to decide if he needs further suppressive therapy. He last saw orthopedics on 4/24 as a follow up to his back surgery, a repeat MRI was conducted to look at L5-S1, but reports unavailable  Since last being seen in April, the patient states that he is doing well. Less use of back pain meds  Current Outpatient Prescriptions on File Prior to Visit  Medication Sig Dispense Refill  . amLODipine (NORVASC) 2.5 MG tablet Take 2.5 mg by mouth every morning.       Marland Kitchen aspirin EC 81 MG tablet Take  81 mg by mouth every morning.       . chlorhexidine (HIBICLENS) 4 % external liquid Wash head to toe in shower once daily at night for 7 days, no deodorant creams until one hour after this shower  237 mL  3  . clopidogrel (PLAVIX) 75 MG tablet Take 1 tablet (75 mg total) by mouth daily with breakfast.      . diazepam (VALIUM) 5 MG tablet Take 5 mg by mouth 2 (two) times daily as needed. Back spasms.      Marland Kitchen doxycycline (VIBRA-TABS) 100 MG tablet Take 1 tablet (100 mg total) by mouth 2 (two) times daily.  60 tablet  1  . indomethacin (INDOCIN) 50 MG capsule Take 50 mg by mouth daily as needed. Gout flare-up.      Marland Kitchen insulin aspart (NOVOLOG) 100 UNIT/ML injection CBG 70 - 120: 0 units CBG 121 - 150: 2 units CBG 151 - 200: 3 units CBG 201 - 250: 5 units CBG 251 - 300: 8 units  Call MD if CBg is greater than 200  1 vial  0  . insulin glargine (LANTUS) 100 UNIT/ML injection Inject 4 Units into the skin daily.  10 mL  12  . lisinopril (PRINIVIL,ZESTRIL) 40 MG tablet Take 40 mg by mouth every morning.       . metFORMIN (GLUCOPHAGE) 500 MG tablet Take 500 mg by mouth 2 (two) times daily with  a meal.       . methocarbamol (ROBAXIN) 500 MG tablet Take 1 tablet (500 mg total) by mouth every 6 (six) hours as needed.  40 tablet  1  . metoprolol (LOPRESSOR) 50 MG tablet Take 50 mg by mouth 2 (two) times daily.      . Multiple Vitamins-Minerals (MULTIVITAMIN PO) Take 1 tablet by mouth every morning.       . mupirocin ointment (BACTROBAN) 2 % Intranasally twice daily for 7 days with antibiotic wash  30 g  2  . Omega-3 Fatty Acids (FISH OIL) 1000 MG CAPS Take 1 capsule by mouth 2 (two) times daily.       Marland Kitchen oxyCODONE-acetaminophen (PERCOCET) 10-325 MG per tablet       . polyethylene glycol (MIRALAX / GLYCOLAX) packet Take 17 g by mouth daily as needed.  14 each  0  . saccharomyces boulardii (FLORASTOR) 250 MG capsule Take 1 capsule (250 mg total) by mouth 2 (two) times daily.  1 capsule  30  . simvastatin (ZOCOR)  20 MG tablet Take 20 mg by mouth every evening.       No current facility-administered medications on file prior to visit.   Active Ambulatory Problems    Diagnosis Date Noted  . PURE HYPERCHOLESTEROLEMIA 02/17/2009  . DYSLIPIDEMIA 02/16/2009  . OVERWEIGHT 02/16/2009  . HYPERTENSION 02/17/2009  . CAD 02/16/2009  . CORONARY ATHEROSCLEROSIS NATIVE CORONARY ARTERY 02/17/2009  . CARDIOMYOPATHY 02/16/2009  . OTH ATHEROSCLEROSIS NATIVE ARTERIES EXTREMITIES 02/17/2009  . PERIPHERAL VASCULAR DISEASE 02/16/2009  . OTH MUSCULOSKELETAL SX REFERABLE LIMBS OTH 02/17/2009  . EDEMA 02/17/2009  . SHORTNESS OF BREATH 02/17/2009  . POSTSURG PERCUT TRANSLUMINAL COR ANGPLSTY STS 02/17/2009  . TOBACCO ABUSE 07/23/2010  . Unspecified circulatory system disorder 12/20/2011  . Critical lower limb ischemia 12/20/2011  . PAD (peripheral artery disease) 01/03/2012  . Atherosclerotic PVD with intermittent claudication 01/17/2012  . Atherosclerosis of native arteries of the extremities with intermittent claudication 01/31/2012  . PVD (peripheral vascular disease) 04/21/2012  . Osteomyelitis of vertebra of lumbosacral region 05/27/2012  . Abscess in epidural space of lumbar spine 06/02/2012  . Spinal stenosis of lumbosacral region 06/02/2012  . DM type 2 (diabetes mellitus, type 2) 06/03/2012  . Non compliance w medication regimen 06/03/2012  . Hypoglycemia 06/03/2012  . Fever 06/04/2012  . Abscess, perirectal 08/12/2012   Resolved Ambulatory Problems    Diagnosis Date Noted  . Atherosclerosis of native arteries of the extremities with intermittent claudication 12/20/2011   Past Medical History  Diagnosis Date  . Dyslipidemia   . Cardiomyopathy   . Diabetes mellitus   . Tobacco abuse   . Anginal pain   . Hypertension   . Coronary artery disease   . Degenerative disc disease, lumbar    Family and social hx reviewed, unchanged since last visit   Review of Systems  Constitutional: Negative for  fever, chills, diaphoresis, activity change, appetite change, fatigue and unexpected weight change.  HENT: Negative for congestion, sore throat, rhinorrhea, sneezing, trouble swallowing and sinus pressure.  Eyes: Negative for photophobia and visual disturbance.  Respiratory: Negative for cough, chest tightness, shortness of breath, wheezing and stridor.  Cardiovascular: Negative for chest pain, palpitations and leg swelling.  Gastrointestinal: Negative for nausea, vomiting, abdominal pain, diarrhea, constipation, blood in stool, abdominal distention and anal bleeding.  Genitourinary: Negative for dysuria, hematuria, flank pain and difficulty urinating.  Musculoskeletal: Negative for myalgias, back pain, joint swelling, arthralgias and gait problem.  Skin: Negative for color  change, pallor, rash and wound.  Neurological: Negative for dizziness, tremors, weakness and light-headedness.  Hematological: Negative for adenopathy. Does not bruise/bleed easily.  Psychiatric/Behavioral: Negative for behavioral problems, confusion, sleep disturbance, dysphoric mood, decreased concentration and agitation.       Objective:   Physical Exam BP 155/78  Pulse 78  Temp(Src) 98.6 F (37 C) (Oral) Physical Exam  Constitutional: He is oriented to person, place, and time. He appears well-developed and well-nourished. In mild discomfort HENT:  Mouth/Throat: Oropharynx is clear and moist. No oropharyngeal exudate.  Cardiovascular: Normal rate, regular rhythm and normal heart sounds. Exam reveals no gallop and no friction rub.  No murmur heard.  Pulmonary/Chest: Effort normal and breath sounds normal. No respiratory distress. He has no wheezes.  Lymphadenopathy:  no cervical adenopathy.     Labs: Lab Results  Component Value Date   ESRSEDRATE 29* 08/12/2012       Assessment & Plan:   osteomyelitis/diskitis = will check sed rate to see if any different from last testing in March to decide if needs  further antibiotics  Furuncles/boils = call back to do I x D vs. Referral to [facility]  rtc PRN basis

## 2012-11-16 ENCOUNTER — Telehealth (INDEPENDENT_AMBULATORY_CARE_PROVIDER_SITE_OTHER): Payer: Self-pay | Admitting: General Surgery

## 2012-11-16 NOTE — Telephone Encounter (Signed)
Ann with [facility] called to refer patient to [place] for scrotal abscess. Records were faxed to [place] and reviewed with Dr Magnus Ivan. In records it states abscess is in area of previous femoral bypass and patient on plavix. Per Dr Magnus Ivan this needs to be referred back to vascular surgery or we can consult in the hospital but we would not see this in the office. Ann with [facility] made aware.

## 2012-11-17 ENCOUNTER — Encounter: Payer: Self-pay | Admitting: Vascular Surgery

## 2012-11-17 ENCOUNTER — Encounter (INDEPENDENT_AMBULATORY_CARE_PROVIDER_SITE_OTHER): Payer: Medicare Other | Admitting: *Deleted

## 2012-11-17 ENCOUNTER — Ambulatory Visit (INDEPENDENT_AMBULATORY_CARE_PROVIDER_SITE_OTHER): Payer: Medicare Other | Admitting: Vascular Surgery

## 2012-11-17 VITALS — BP 128/78 | HR 77 | Resp 16 | Ht 69.0 in | Wt 210.0 lb

## 2012-11-17 DIAGNOSIS — Z48812 Encounter for surgical aftercare following surgery on the circulatory system: Secondary | ICD-10-CM

## 2012-11-17 DIAGNOSIS — I739 Peripheral vascular disease, unspecified: Secondary | ICD-10-CM

## 2012-11-17 DIAGNOSIS — N5089 Other specified disorders of the male genital organs: Secondary | ICD-10-CM | POA: Insufficient documentation

## 2012-11-17 NOTE — Progress Notes (Signed)
Subjective:     Patient ID: Logan [name], male   DOB: Nov 09, 1945, 67 y.o.   MRN: [id number]  HPI this 67 year old complicated male patient has an aortobifemoral bypass graft with occlusion of the right limb. He also has a left-to-right femoral-femoral bypass graft. He had a history of a draining sinus from the left inguinal area several months ago. This closed without any evidence of residual infection. Patient developed a scrotal abscess apparently and had a small draining sinus lateral to the right inguinal incision the patient was referred for further evaluation. Patient is currently on clindamycin. He denies any recent chills or fever.  Past Medical History  Diagnosis Date  . Overweight(278.02)   . Dyslipidemia   . Cardiomyopathy   . Diabetes mellitus   . PVD (peripheral vascular disease)     right to left fem -fem bypass 1997/ aortabifemoral bypass 2005 right to left fem-fem 07/2004. Secondary to occluded righ tlimb of aortobifemoral bypass  . Tobacco abuse   . Anginal pain   . Hypertension   . Shortness of breath   . Coronary artery disease     taxis DES stent circumflex 02/2003.... residual total distal circumflex and 50% LAD/ cardiolite 08/2006 inferior scar no ischemia  . Degenerative disc disease, lumbar     History  Substance Use Topics  . Smoking status: Former Smoker -- 40 years    Types: Cigarettes    Quit date: 12/20/2011  . Smokeless tobacco: Never Used  . Alcohol Use: Yes     Comment: liquior sometimes    Family History  Problem Relation Age of Onset  . Hypertension Mother     Allergies  Allergen Reactions  . Aspirin Hives  . Penicillins Rash and Other (See Comments)    Immune system shut down     Current outpatient prescriptions:amLODipine (NORVASC) 2.5 MG tablet, Take 2.5 mg by mouth every morning. , Disp: , Rfl: ;  aspirin EC 81 MG tablet, Take 81 mg by mouth every morning. , Disp: , Rfl: ;  clopidogrel (PLAVIX) 75 MG tablet, Take 1 tablet (75 mg total)  by mouth daily with breakfast., Disp: , Rfl: ;  diazepam (VALIUM) 5 MG tablet, Take 5 mg by mouth 2 (two) times daily as needed. Back spasms., Disp: , Rfl:  indomethacin (INDOCIN) 50 MG capsule, Take 50 mg by mouth daily as needed. Gout flare-up., Disp: , Rfl: ;  insulin aspart (NOVOLOG) 100 UNIT/ML injection, CBG 70 - 120: 0 units CBG 121 - 150: 2 units CBG 151 - 200: 3 units CBG 201 - 250: 5 units CBG 251 - 300: 8 units  Call MD if CBg is greater than 200, Disp: 1 vial, Rfl: 0;  insulin glargine (LANTUS) 100 UNIT/ML injection, Inject 4 Units into the skin daily., Disp: 10 mL, Rfl: 12 lisinopril (PRINIVIL,ZESTRIL) 40 MG tablet, Take 40 mg by mouth every morning. , Disp: , Rfl: ;  metFORMIN (GLUCOPHAGE) 500 MG tablet, Take 500 mg by mouth 2 (two) times daily with a meal. , Disp: , Rfl: ;  methocarbamol (ROBAXIN) 500 MG tablet, Take 1 tablet (500 mg total) by mouth every 6 (six) hours as needed., Disp: 40 tablet, Rfl: 1;  metoprolol (LOPRESSOR) 50 MG tablet, Take 50 mg by mouth 2 (two) times daily., Disp: , Rfl:  Multiple Vitamins-Minerals (MULTIVITAMIN PO), Take 1 tablet by mouth every morning. , Disp: , Rfl: ;  Omega-3 Fatty Acids (FISH OIL) 1000 MG CAPS, Take 1 capsule by mouth 2 (two) times daily. ,  Disp: , Rfl: ;  oxyCODONE-acetaminophen (PERCOCET) 10-325 MG per tablet, , Disp: , Rfl: ;  simvastatin (ZOCOR) 20 MG tablet, Take 20 mg by mouth every evening., Disp: , Rfl:  chlorhexidine (HIBICLENS) 4 % external liquid, Wash head to toe in shower once daily at night for 7 days, no deodorant creams until one hour after this shower, Disp: 237 mL, Rfl: 3;  mupirocin ointment (BACTROBAN) 2 %, Intranasally twice daily for 7 days with antibiotic wash, Disp: 30 g, Rfl: 2;  polyethylene glycol (MIRALAX / GLYCOLAX) packet, Take 17 g by mouth daily as needed., Disp: 14 each, Rfl: 0 saccharomyces boulardii (FLORASTOR) 250 MG capsule, Take 1 capsule (250 mg total) by mouth 2 (two) times daily., Disp: 1 capsule, Rfl:  30  BP 128/78  Pulse 77  Resp 16  Ht 5\' 9"  (1.753 m)  Wt 210 lb (95.255 kg)  BMI 31 kg/m2  Body mass index is 31 kg/(m^2).            Review of Systems patient denies chest pain, dyspnea on exertion, PND, orthopnea, hemoptysis, claudication.     Objective:   Physical Exam blood pressure 120/78 heart rate 77 respirations 16 General chronically ill-appearing male in no apparent distress alert and oriented x3 Lungs no rhonchi or wheezing Abdomen soft nontender no masses Femoral-femoral graft is functioning nicely with 3+ pulse. Evidence of small sinus lateral to right inguinal incision in the inguinal crease. No drainage from either inguinal incision and no tenderness over femoral-femoral graft  Data ordered a duplex scan of the femoral femoral graft which I reviewed and interpreted The femoral-femoral graft is widely patent and there is no evidence of fluid surrounding the graft suggestive of infection     Assessment:     Widely patent left-to-right femoral-femoral bypass with no evidence of graft infection- Recent scrotal infection and small sinus tract lateral inguinal area not associated with femoral-femoral graft    Plan:     Continue treatment as seen indicating per urology-no vascular surgical procedures indicated-no evidence of vascular graft infection Return to see me on when necessary basis

## 2012-12-01 ENCOUNTER — Encounter: Payer: Self-pay | Admitting: Internal Medicine

## 2012-12-01 ENCOUNTER — Ambulatory Visit (INDEPENDENT_AMBULATORY_CARE_PROVIDER_SITE_OTHER): Payer: Medicare Other | Admitting: Internal Medicine

## 2012-12-01 VITALS — BP 122/70 | HR 80 | Temp 98.0°F | Wt 214.0 lb

## 2012-12-01 DIAGNOSIS — L03119 Cellulitis of unspecified part of limb: Secondary | ICD-10-CM

## 2012-12-01 DIAGNOSIS — L02419 Cutaneous abscess of limb, unspecified: Secondary | ICD-10-CM

## 2012-12-01 LAB — CBC WITH DIFFERENTIAL/PLATELET
Basophils Absolute: 0.1 10*3/uL (ref 0.0–0.1)
Eosinophils Relative: 3 % (ref 0–5)
Lymphocytes Relative: 40 % (ref 12–46)
MCV: 80.6 fL (ref 78.0–100.0)
Neutro Abs: 3.6 10*3/uL (ref 1.7–7.7)
Platelets: 245 10*3/uL (ref 150–400)
RDW: 16 % — ABNORMAL HIGH (ref 11.5–15.5)
WBC: 7.7 10*3/uL (ref 4.0–10.5)

## 2012-12-01 LAB — SEDIMENTATION RATE: Sed Rate: 10 mm/hr (ref 0–16)

## 2012-12-01 NOTE — Progress Notes (Signed)
[facility]  RFV: follow up for inguinal abscess s/p aspiration Subjective:    Patient ID: Logan [name], male    DOB: [birth year], 67 y.o.   MRN: [id number]  HPI 67 year old complicated male patient has an aortobifemoral bypass graft with occlusion of the right limb. He also has a left-to-right femoral-femoral bypass graft. He had a history of a draining sinus from the left inguinal area several months ago. This closed without any evidence of residual infection. Patient developed a scrotal abscess apparently and had a small draining sinus lateral to the right inguinal incision the patient was referred for further evaluation. Patient is currently on clindamycin. He denies any recent chills or fever. He was at his urologist on July 1st for drainage of wounds at groin which showed lactobacillus. He was referred to vascular surgery which did not show any involvement with Aorto bifem bypass graft. He is referred to ID to see how to further manage. Placed on clindamycin by urology for the past 2 wks. He mentions that he is having some drainage, clear fluid from his scrotum, where he has been using depends twice a day  Wound cx 11/17/12: showed mod gpc in pairs and in clusters and rare GNR< culture identified a few lactobacillus.  No fever, chills, nighstweats  Current Outpatient Prescriptions on File Prior to Visit  Medication Sig Dispense Refill  . amLODipine (NORVASC) 2.5 MG tablet Take 2.5 mg by mouth every morning.       Marland Kitchen aspirin EC 81 MG tablet Take 81 mg by mouth every morning.       . chlorhexidine (HIBICLENS) 4 % external liquid Wash head to toe in shower once daily at night for 7 days, no deodorant creams until one hour after this shower  237 mL  3  . clopidogrel (PLAVIX) 75 MG tablet Take 1 tablet (75 mg total) by mouth daily with breakfast.      . diazepam (VALIUM) 5 MG tablet Take 5 mg by mouth 2 (two) times daily as needed. Back spasms.      . indomethacin (INDOCIN) 50 MG capsule Take 50  mg by mouth daily as needed. Gout flare-up.      Marland Kitchen insulin aspart (NOVOLOG) 100 UNIT/ML injection CBG 70 - 120: 0 units CBG 121 - 150: 2 units CBG 151 - 200: 3 units CBG 201 - 250: 5 units CBG 251 - 300: 8 units  Call MD if CBg is greater than 200  1 vial  0  . insulin glargine (LANTUS) 100 UNIT/ML injection Inject 4 Units into the skin daily.  10 mL  12  . lisinopril (PRINIVIL,ZESTRIL) 40 MG tablet Take 40 mg by mouth every morning.       . metFORMIN (GLUCOPHAGE) 500 MG tablet Take 500 mg by mouth 2 (two) times daily with a meal.       . methocarbamol (ROBAXIN) 500 MG tablet Take 1 tablet (500 mg total) by mouth every 6 (six) hours as needed.  40 tablet  1  . metoprolol (LOPRESSOR) 50 MG tablet Take 50 mg by mouth 2 (two) times daily.      . Multiple Vitamins-Minerals (MULTIVITAMIN PO) Take 1 tablet by mouth every morning.       . mupirocin ointment (BACTROBAN) 2 % Intranasally twice daily for 7 days with antibiotic wash  30 g  2  . Omega-3 Fatty Acids (FISH OIL) 1000 MG CAPS Take 1 capsule by mouth 2 (two) times daily.       Marland Kitchen  oxyCODONE-acetaminophen (PERCOCET) 10-325 MG per tablet       . polyethylene glycol (MIRALAX / GLYCOLAX) packet Take 17 g by mouth daily as needed.  14 each  0  . saccharomyces boulardii (FLORASTOR) 250 MG capsule Take 1 capsule (250 mg total) by mouth 2 (two) times daily.  1 capsule  30  . simvastatin (ZOCOR) 20 MG tablet Take 20 mg by mouth every evening.       No current facility-administered medications on file prior to visit.   Active Ambulatory Problems    Diagnosis Date Noted  . PURE HYPERCHOLESTEROLEMIA 02/17/2009  . DYSLIPIDEMIA 02/16/2009  . OVERWEIGHT 02/16/2009  . HYPERTENSION 02/17/2009  . CAD 02/16/2009  . CORONARY ATHEROSCLEROSIS NATIVE CORONARY ARTERY 02/17/2009  . CARDIOMYOPATHY 02/16/2009  . OTH ATHEROSCLEROSIS NATIVE ARTERIES EXTREMITIES 02/17/2009  . PERIPHERAL VASCULAR DISEASE 02/16/2009  . OTH MUSCULOSKELETAL SX REFERABLE LIMBS OTH  02/17/2009  . EDEMA 02/17/2009  . SHORTNESS OF BREATH 02/17/2009  . POSTSURG PERCUT TRANSLUMINAL COR ANGPLSTY STS 02/17/2009  . TOBACCO ABUSE 07/23/2010  . Unspecified circulatory system disorder 12/20/2011  . Critical lower limb ischemia 12/20/2011  . PAD (peripheral artery disease) 01/03/2012  . Atherosclerotic PVD with intermittent claudication 01/17/2012  . Atherosclerosis of native arteries of the extremities with intermittent claudication 01/31/2012  . PVD (peripheral vascular disease) 04/21/2012  . Osteomyelitis of vertebra of lumbosacral region 05/27/2012  . Abscess in epidural space of lumbar spine 06/02/2012  . Spinal stenosis of lumbosacral region 06/02/2012  . DM type 2 (diabetes mellitus, type 2) 06/03/2012  . Non compliance w medication regimen 06/03/2012  . Hypoglycemia 06/03/2012  . Fever 06/04/2012  . Abscess, perirectal 08/12/2012  . Scrotal swelling 11/17/2012  . Aftercare following surgery of the circulatory system, NEC 11/17/2012   Resolved Ambulatory Problems    Diagnosis Date Noted  . Atherosclerosis of native arteries of the extremities with intermittent claudication 12/20/2011   Past Medical History  Diagnosis Date  . Dyslipidemia   . Cardiomyopathy   . Diabetes mellitus   . Tobacco abuse   . Anginal pain   . Hypertension   . Coronary artery disease   . Degenerative disc disease, lumbar    History  Substance Use Topics  . Smoking status: Former Smoker -- 40 years    Types: Cigarettes    Quit date: 12/20/2011  . Smokeless tobacco: Never Used  . Alcohol Use: Yes     Comment: liquior sometimes  family history includes Hypertension in his mother.  Review of Systems Review of Systems  Constitutional: Negative for fever, chills, diaphoresis, activity change, appetite change, fatigue and unexpected weight change.  HENT: Negative for congestion, sore throat, rhinorrhea, sneezing, trouble swallowing and sinus pressure.  Eyes: Negative for photophobia  and visual disturbance.  Respiratory: Negative for cough, chest tightness, shortness of breath, wheezing and stridor.  Cardiovascular: Negative for chest pain, palpitations and leg swelling.  Gastrointestinal: Negative for nausea, vomiting, abdominal pain, diarrhea, constipation, blood in stool, abdominal distention and anal bleeding.  Genitourinary: Negative for dysuria, hematuria, flank pain and difficulty urinating.  Musculoskeletal: Negative for myalgias, back pain, joint swelling, arthralgias and gait problem.  Skin: Negative for color change, pallor, rash and wound.  Neurological: Negative for dizziness, tremors, weakness and light-headedness.  Hematological: Negative for adenopathy. Does not bruise/bleed easily.  Psychiatric/Behavioral: Negative for behavioral problems, confusion, sleep disturbance, dysphoric mood, decreased concentration and agitation.       Objective:   Physical Exam BP 122/70  Pulse 80  Temp(Src) 98 F (  36.7 C) (Oral)  Wt 214 lb (97.07 kg)  BMI 31.59 kg/m2 Physical Exam  Constitutional: He is oriented to person, place, and time. He appears well-developed and well-nourished. No distress.  HENT:  Mouth/Throat: Oropharynx is clear and moist. No oropharyngeal exudate.  Cardiovascular: Normal rate, regular rhythm and normal heart sounds. Exam reveals no gallop and no friction rub.  No murmur heard.  Pulmonary/Chest: Effort normal and breath sounds normal. No respiratory distress. He has no wheezes.  Abdominal: Soft. Bowel sounds are normal. He exhibits no distension. There is no tenderness.  Lymphadenopathy:  no cervical adenopathy.  GU: scar tissue in inguinal area bilaterally from fem-aorta bypass. No warmth, no fluctuance or enduration or purulent drainage. Scrotum on right side has fissure with serous drainage Neurological: He is alert and oriented to person, place, and time.  Skin: Skin is warm and dry. No rash noted. No erythema.  Psychiatric: He has a  normal mood and affect. His behavior is normal.    Lab Results  Component Value Date   ESRSEDRATE 10 12/01/2012   Lab Results  Component Value Date   CRP 1.2* 12/01/2012   Lab Results  Component Value Date   WBC 7.7 12/01/2012   HGB 11.8* 12/01/2012   HCT 36.6* 12/01/2012   MCV 80.6 12/01/2012   PLT 245 12/01/2012         Assessment & Plan:  Inguinal Abscess= finish up course of clindamycin. On physical exam it appears that it is healed. We will check cbc, sed rate, crp to see if need to continue course of antibiotics  Scrotal fissure = would recommend moisturizing ointment to the area to see if that helps heal the area. It does not appear c/w cellulitis. Does not need to have antibiotics

## 2012-12-09 ENCOUNTER — Ambulatory Visit: Payer: Medicare Other | Admitting: Internal Medicine

## 2013-07-07 DIAGNOSIS — H1030 Unspecified acute conjunctivitis, unspecified eye: Secondary | ICD-10-CM | POA: Diagnosis not present

## 2013-08-04 DIAGNOSIS — I10 Essential (primary) hypertension: Secondary | ICD-10-CM | POA: Diagnosis not present

## 2013-08-04 DIAGNOSIS — IMO0001 Reserved for inherently not codable concepts without codable children: Secondary | ICD-10-CM | POA: Diagnosis not present

## 2013-08-09 ENCOUNTER — Other Ambulatory Visit (HOSPITAL_BASED_OUTPATIENT_CLINIC_OR_DEPARTMENT_OTHER): Payer: Self-pay | Admitting: Internal Medicine

## 2013-08-30 IMAGING — CT CT ABD-PELV W/O CM
2 of 4 series · 17 of 46 positions shown, 19 images · non-contrast
Comparison: 03/20/2012

CLINICAL DATA: Severe abdominal and back pain.  Previous abdominal
aortic aneurysm repair.

CT ABDOMEN AND PELVIS WITHOUT CONTRAST
TECHNIQUE: Multidetector CT imaging of the abdomen and pelvis was
performed following the standard protocol without intravenous
contrast.

[Series 2: abd/pelv w/o 5.0 b31f st · axial · non-contrast · 0.79mm/px · z∈[-543,-63]mm · 14 of 106 slices shown, 16 images]
[im 5/106  soft-tissue]
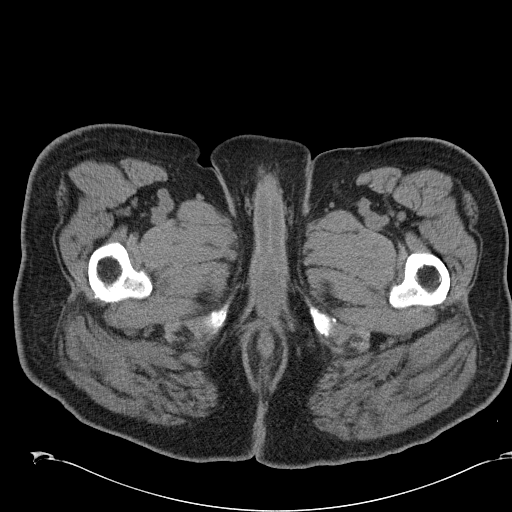
[im 5/106  bone]
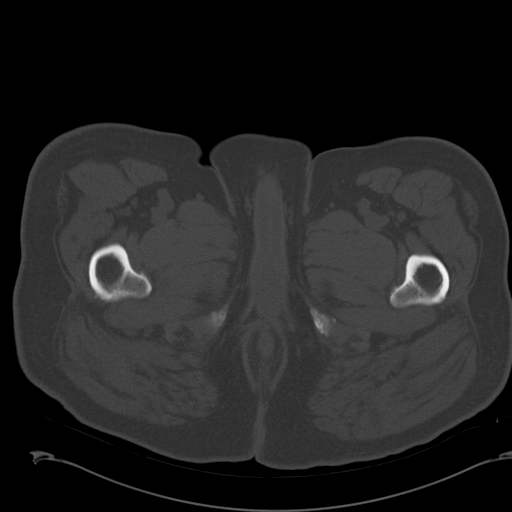
[im 13/106  soft-tissue]
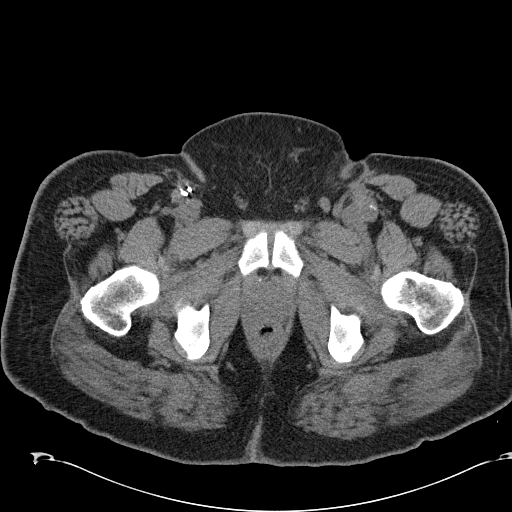
[im 22/106  soft-tissue]
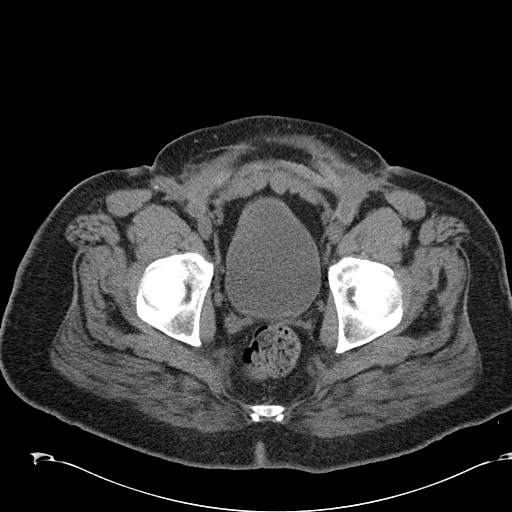
[im 30/106  soft-tissue]
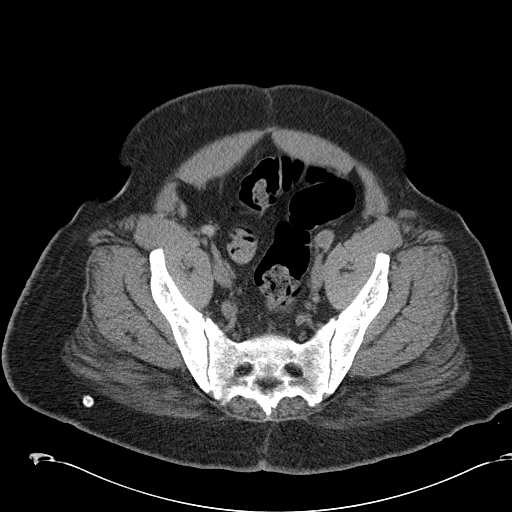
[im 34/106  soft-tissue]
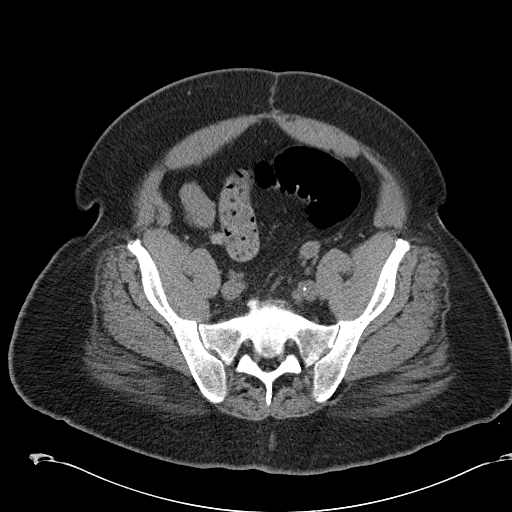
[im 43/106  soft-tissue]
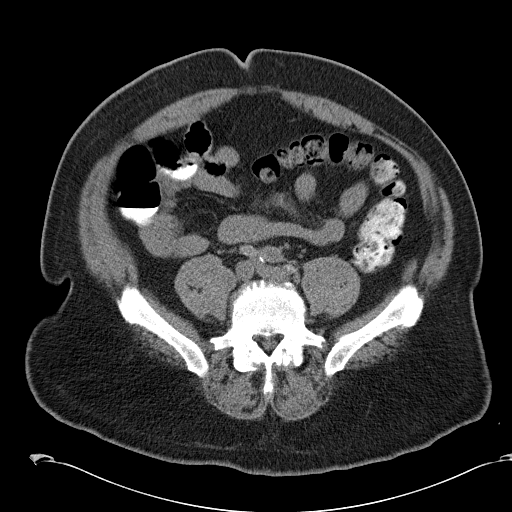
[im 51/106  soft-tissue]
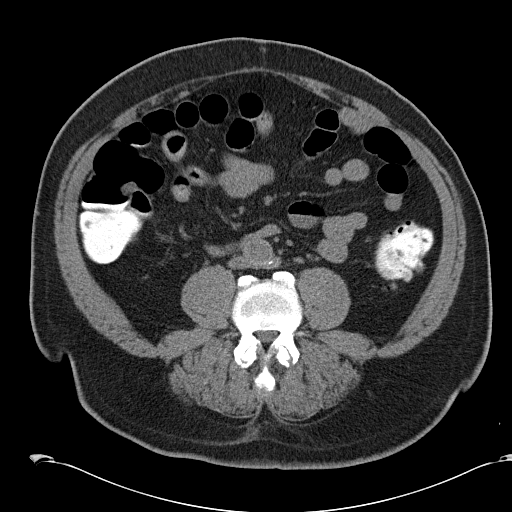
[im 55/106  soft-tissue]
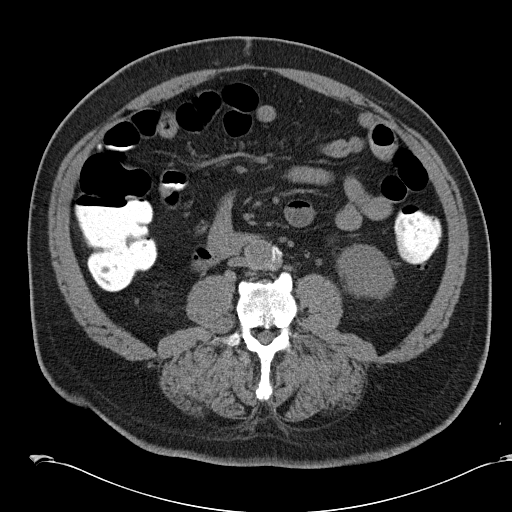
[im 64/106  soft-tissue]
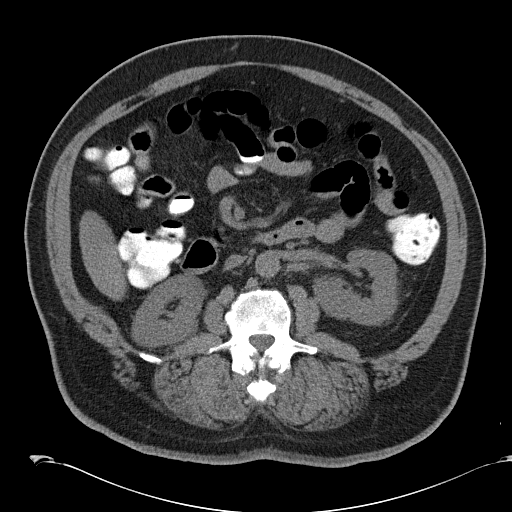
[im 64/106  bone]
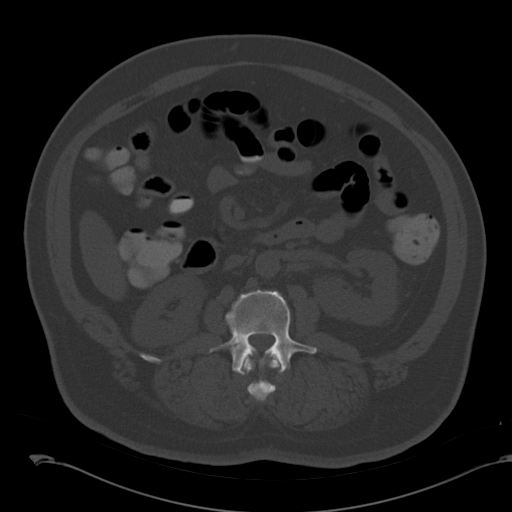
[im 72/106  soft-tissue]
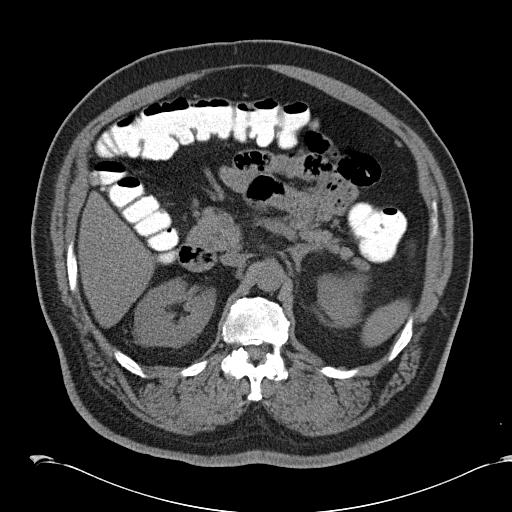
[im 80/106  soft-tissue]
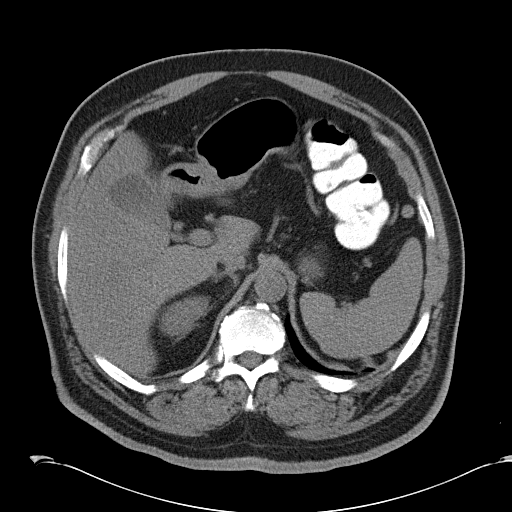
[im 85/106  soft-tissue]
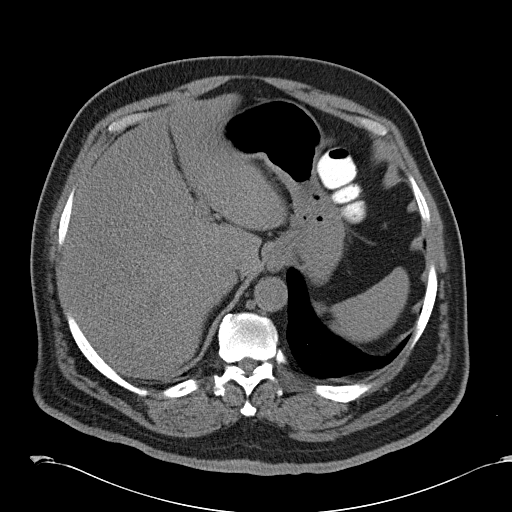
[im 93/106  soft-tissue]
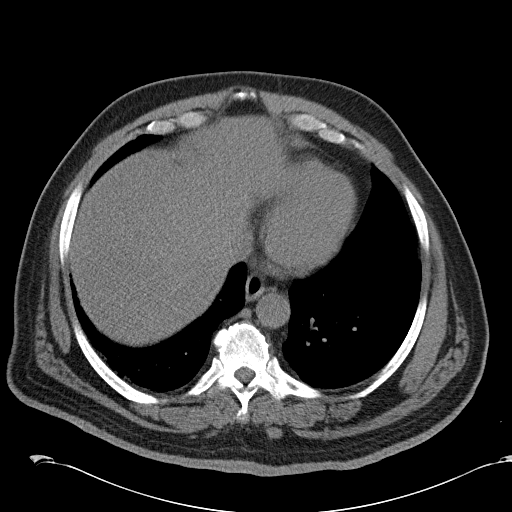
[im 101/106  soft-tissue]
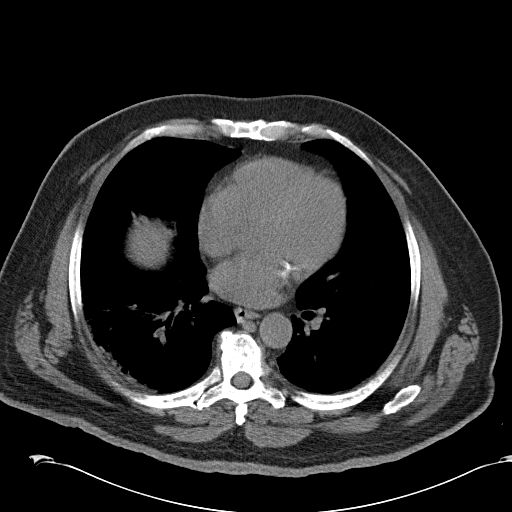

[Series 602: coronal abd/pel · coronal · 1.03mm/px · 3 of 113 slices shown]
[im 38/113  soft-tissue]
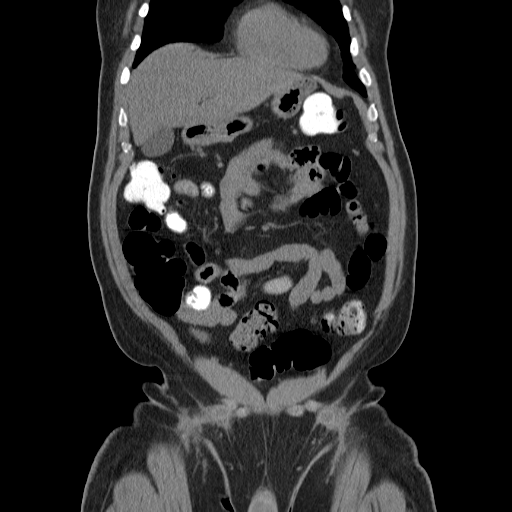
[im 50/113  soft-tissue]
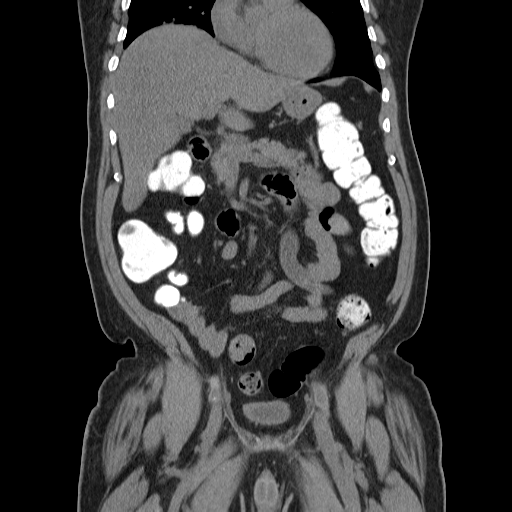
[im 63/113  soft-tissue]
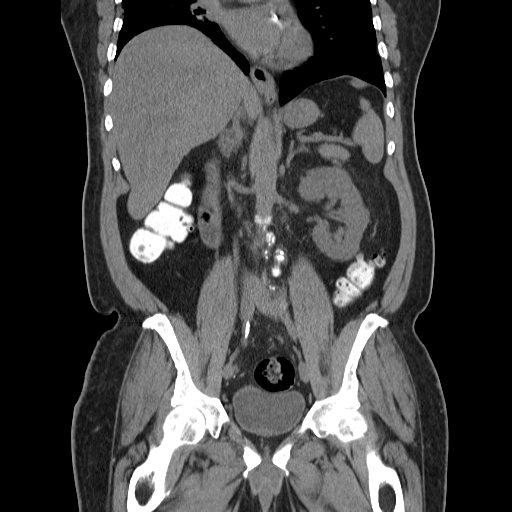

[17 of 46 positions shown; findings below may reference images not displayed]

FINDINGS: Stable appearance of the aortobifemoral bypass graft
noted.  To fem-fem bypass grafts are again demonstrated. No
evidence of retroperitoneal hemorrhage or mass.  Mildly enlarged
prostate gland is stable. No lymphadenopathy seen within the pelvis
or abdomen.

The abdominal parenchymal organs have a normal appearance on this
noncontrast study.  No evidence of hydronephrosis.  Gallbladder is
unremarkable.  No soft tissue masses identified.  Diverticulosis is
again noted, however there is no evidence of diverticulitis.  Other
inflammatory process or abnormal fluid collections are identified.
IMPRESSION: 1.  No acute findings.
2.  Diverticulosis.  No radiographic evidence of diverticulitis.

## 2013-09-15 DIAGNOSIS — I10 Essential (primary) hypertension: Secondary | ICD-10-CM | POA: Diagnosis not present

## 2013-09-15 DIAGNOSIS — IMO0001 Reserved for inherently not codable concepts without codable children: Secondary | ICD-10-CM | POA: Diagnosis not present

## 2013-09-26 IMAGING — US IR FLUORO GUIDE CV LINE*R*
1 series · 1 of 1 positions shown · non-contrast
Comparison: none

CLINICAL DATA: Lumbar osteomyelitis.

[Series 1: ir fluoro guide cv line*right* · 1 of 1 slices shown]
[im 1/1]
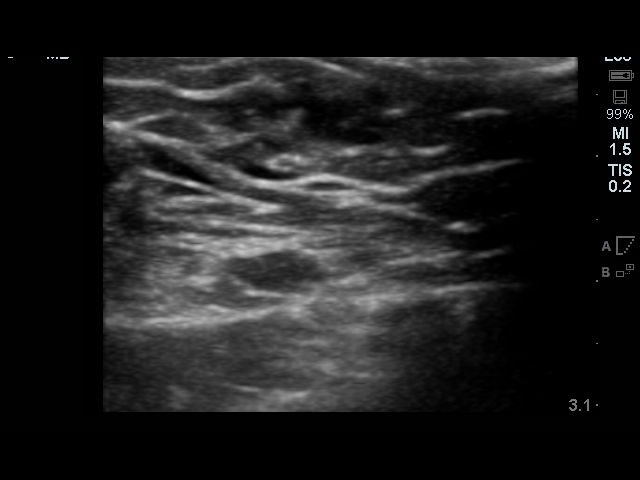

[1 of 1 positions shown; findings below may reference images not displayed]

PICC LINE PLACEMENT WITH ULTRASOUND AND FLUOROSCOPIC  GUIDANCE

Fluoroscopy Time: 0.1 minutes.

The right arm was prepped with chlorhexidine, draped in the usual
sterile fashion using maximum barrier technique (cap and mask,
sterile gown, sterile gloves, large sterile sheet, hand hygiene and
cutaneous antisepsis) and infiltrated locally with 1% Lidocaine.

Ultrasound demonstrated patency of the right basilic vein, and this
was documented with an image.  Under real-time ultrasound guidance,
this vein was accessed with a 21 gauge micropuncture needle and
image documentation was performed.  The needle was exchanged over a
guidewire for a peel-away sheath through which a 5 French single
lumen PICC trimmed to 40 cm was advanced, positioned with its tip
at the lower SVC/right atrial junction.  Fluoroscopy during the
procedure and fluoro spot radiograph confirms appropriate catheter
position.  The catheter was flushed, secured to the skin with
Prolene sutures, and covered with a sterile dressing.

Complications:  None
IMPRESSION: Successful right arm PICC line placement with ultrasound and
fluoroscopic guidance.  The catheter is ready for use.

## 2013-09-30 IMAGING — CR DG SPINE 1V PORT
1 series · 1 of 1 positions shown · non-contrast
Comparison: 05/26/2012, CT 05/02/2012

CLINICAL DATA: Decompression L5-S1

PORTABLE SPINE - 1 VIEW

[lateral]
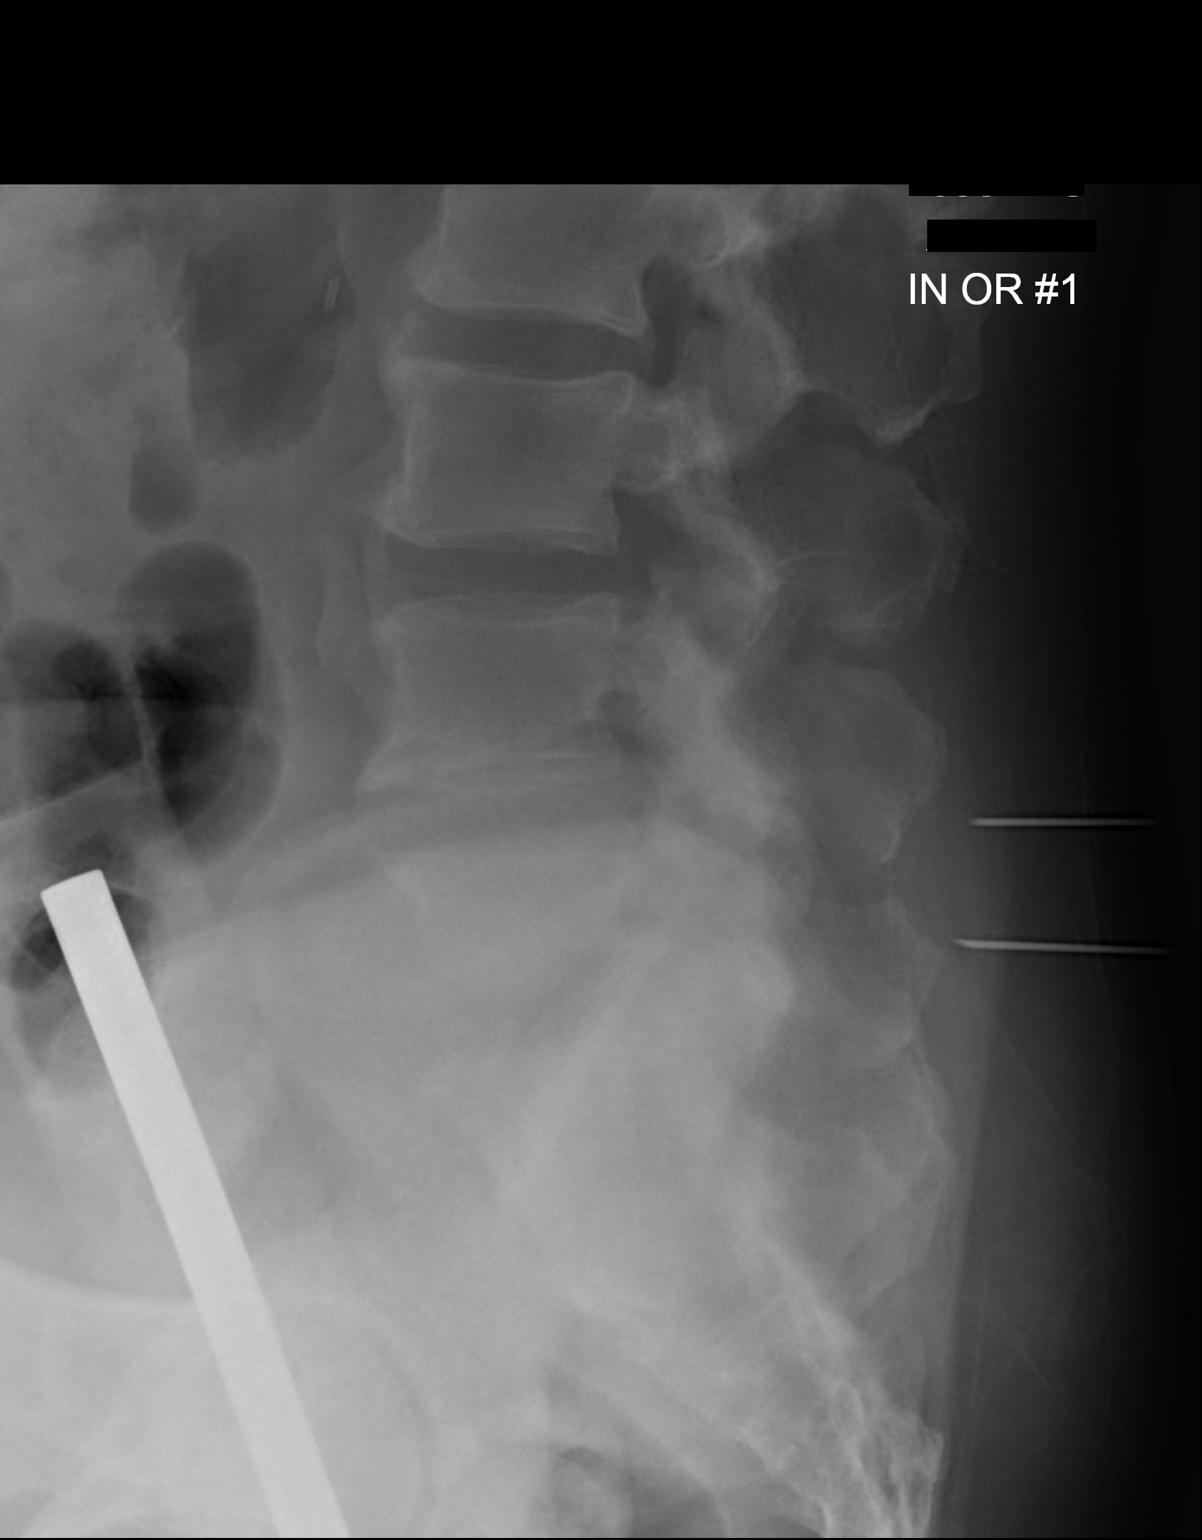

[1 of 1 positions shown; findings below may reference images not displayed]

FINDINGS: There is a needle directed at the L4 spinous process.

There is a second needle directed at the L5 spinous process.
IMPRESSION: Surgical localization L4 and L5 spinous processes.

## 2014-02-11 ENCOUNTER — Telehealth: Payer: Self-pay | Admitting: *Deleted

## 2014-02-11 NOTE — Telephone Encounter (Signed)
Daughter called to report that Mr. Ybanez has started to complain about severe leg pain when walking. She doesn't know if he is having any discoloration or erythema in the leg. 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 Evelena Leyden. He is also complaining of some dyspepsia at times; he is afebrile. I offered to have him come to the office today for ABI's and to see our NP but she said that he doesn't have transportation from [place]. I made him an appt to come in on Tuesday at 1:30pm but I advised her to take him to [facility] ED for evaluation of a possible occluded BPG asap. She is going to talk to the family about transportation issue.

## 2014-02-14 ENCOUNTER — Other Ambulatory Visit: Payer: Self-pay | Admitting: *Deleted

## 2014-02-14 ENCOUNTER — Encounter: Payer: Self-pay | Admitting: Family

## 2014-02-14 DIAGNOSIS — M79669 Pain in unspecified lower leg: Secondary | ICD-10-CM

## 2014-02-14 DIAGNOSIS — Z48812 Encounter for surgical aftercare following surgery on the circulatory system: Secondary | ICD-10-CM

## 2014-02-14 DIAGNOSIS — I739 Peripheral vascular disease, unspecified: Secondary | ICD-10-CM

## 2014-02-15 ENCOUNTER — Ambulatory Visit (INDEPENDENT_AMBULATORY_CARE_PROVIDER_SITE_OTHER): Payer: Medicare Other | Admitting: Family

## 2014-02-15 ENCOUNTER — Ambulatory Visit (HOSPITAL_COMMUNITY)
Admission: RE | Admit: 2014-02-15 | Discharge: 2014-02-15 | Disposition: A | Discharge status: Home / Self Care | Payer: Medicare Other | Source: Ambulatory Visit | Attending: Family | Admitting: Family

## 2014-02-15 ENCOUNTER — Encounter: Payer: Self-pay | Admitting: Family

## 2014-02-15 VITALS — BP 126/75 | HR 75 | Resp 16 | Ht 69.0 in | Wt 206.0 lb

## 2014-02-15 DIAGNOSIS — I70219 Atherosclerosis of native arteries of extremities with intermittent claudication, unspecified extremity: Secondary | ICD-10-CM

## 2014-02-15 DIAGNOSIS — E119 Type 2 diabetes mellitus without complications: Secondary | ICD-10-CM | POA: Diagnosis not present

## 2014-02-15 DIAGNOSIS — Z48812 Encounter for surgical aftercare following surgery on the circulatory system: Secondary | ICD-10-CM

## 2014-02-15 DIAGNOSIS — M79609 Pain in unspecified limb: Secondary | ICD-10-CM

## 2014-02-15 DIAGNOSIS — I10 Essential (primary) hypertension: Secondary | ICD-10-CM | POA: Diagnosis not present

## 2014-02-15 DIAGNOSIS — M79662 Pain in left lower leg: Secondary | ICD-10-CM | POA: Insufficient documentation

## 2014-02-15 DIAGNOSIS — E785 Hyperlipidemia, unspecified: Secondary | ICD-10-CM | POA: Diagnosis not present

## 2014-02-15 DIAGNOSIS — I739 Peripheral vascular disease, unspecified: Secondary | ICD-10-CM | POA: Diagnosis not present

## 2014-02-15 DIAGNOSIS — M79669 Pain in unspecified lower leg: Secondary | ICD-10-CM

## 2014-02-15 DIAGNOSIS — F17200 Nicotine dependence, unspecified, uncomplicated: Secondary | ICD-10-CM | POA: Insufficient documentation

## 2014-02-15 NOTE — Patient Instructions (Signed)
Peripheral Vascular Disease Peripheral Vascular Disease (PVD), also called Peripheral Arterial Disease (PAD), is a circulation problem caused by cholesterol (atherosclerotic plaque) deposits in the arteries. PVD commonly occurs in the lower extremities (legs) but it can occur in other areas of the body, such as your arms. The cholesterol buildup in the arteries reduces blood flow which can cause pain and other serious problems. The presence of PVD can place a person at risk for Coronary Artery Disease (CAD).  CAUSES  Causes of PVD can be many. It is usually associated with more than one risk factor such as:   High Cholesterol.  Smoking.  Diabetes.  Lack of exercise or inactivity.  High blood pressure (hypertension).  Obesity.  Family history. SYMPTOMS   When the lower extremities are affected, patients with PVD may experience:  Leg pain with exertion or physical activity. This is called INTERMITTENT CLAUDICATION. This may present as cramping or numbness with physical activity. The location of the pain is associated with the level of blockage. For example, blockage at the abdominal level (distal abdominal aorta) may result in buttock or hip pain. Lower leg arterial blockage may result in calf pain.  As PVD becomes more severe, pain can develop with less physical activity.  In people with severe PVD, leg pain may occur at rest.  Other PVD signs and symptoms:  Leg numbness or weakness.  Coldness in the affected leg or foot, especially when compared to the other leg.  A change in leg color.  Patients with significant PVD are more prone to ulcers or sores on toes, feet or legs. These may take longer to heal or may reoccur. The ulcers or sores can become infected.  If signs and symptoms of PVD are ignored, gangrene may occur. This can result in the loss of toes or loss of an entire limb.  Not all leg pain is related to PVD. Other medical conditions can cause leg pain such  as:  Blood clots (embolism) or Deep Vein Thrombosis.  Inflammation of the blood vessels (vasculitis).  Spinal stenosis. DIAGNOSIS  Diagnosis of PVD can involve several different types of tests. These can include:  Pulse Volume Recording Method (PVR). This test is simple, painless and does not involve the use of X-rays. PVR involves measuring and comparing the blood pressure in the arms and legs. An ABI (Ankle-Brachial Index) is calculated. The normal ratio of blood pressures is 1. As this number becomes smaller, it indicates more severe disease.  < 0.95 - indicates significant narrowing in one or more leg vessels.  <0.8 - there will usually be pain in the foot, leg or buttock with exercise.  <0.4 - will usually have pain in the legs at rest.  <0.25 - usually indicates limb threatening PVD.  Doppler detection of pulses in the legs. This test is painless and checks to see if you have a pulses in your legs/feet.  A dye or contrast material (a substance that highlights the blood vessels so they show up on x-ray) may be given to help your caregiver better see the arteries for the following tests. The dye is eliminated from your body by the kidney's. Your caregiver may order blood work to check your kidney function and other laboratory values before the following tests are performed:  Magnetic Resonance Angiography (MRA). An MRA is a picture study of the blood vessels and arteries. The MRA machine uses a large magnet to produce images of the blood vessels.  Computed Tomography Angiography (CTA). A CTA   is a specialized x-ray that looks at how the blood flows in your blood vessels. An IV may be inserted into your arm so contrast dye can be injected.  Angiogram. Is a procedure that uses x-rays to look at your blood vessels. This procedure is minimally invasive, meaning a small incision (cut) is made in your groin. A small tube (catheter) is then inserted into the artery of your groin. The catheter  is guided to the blood vessel or artery your caregiver wants to examine. Contrast dye is injected into the catheter. X-rays are then taken of the blood vessel or artery. After the images are obtained, the catheter is taken out. TREATMENT  Treatment of PVD involves many interventions which may include:  Lifestyle changes:  Quitting smoking.  Exercise.  Following a low fat, low cholesterol diet.  Control of diabetes.  Foot care is very important to the PVD patient. Good foot care can help prevent infection.  Medication:  Cholesterol-lowering medicine.  Blood pressure medicine.  Anti-platelet drugs.  Certain medicines may reduce symptoms of Intermittent Claudication.  Interventional/Surgical options:  Angioplasty. An Angioplasty is a procedure that inflates a balloon in the blocked artery. This opens the blocked artery to improve blood flow.  Stent Implant. A wire mesh tube (stent) is placed in the artery. The stent expands and stays in place, allowing the artery to remain open.  Peripheral Bypass Surgery. This is a surgical procedure that reroutes the blood around a blocked artery to help improve blood flow. This type of procedure may be performed if Angioplasty or stent implants are not an option. SEEK IMMEDIATE MEDICAL CARE IF:   You develop pain or numbness in your arms or legs.  Your arm or leg turns cold, becomes blue in color.  You develop redness, warmth, swelling and pain in your arms or legs. MAKE SURE YOU:   Understand these instructions.  Will watch your condition.  Will get help right away if you are not doing well or get worse. Document Released: 06/13/2004 Document Revised: 07/29/2011 Document Reviewed: 05/10/2008 Exit[employer] Patient Information 2015 [employer]. This information is not intended to replace advice given to you by your health care provider. Make sure you discuss any questions you have with your health care provider.   Smoking  Cessation Quitting smoking is important to your health and has many advantages. However, it is not always easy to quit since nicotine is a very addictive drug. Oftentimes, people try 3 times or more before being able to quit. This document explains the best ways for you to prepare to quit smoking. Quitting takes hard work and a lot of effort, but you can do it. ADVANTAGES OF QUITTING SMOKING  You will live longer, feel better, and live better.  Your body will feel the impact of quitting smoking almost immediately.  Within 20 minutes, blood pressure decreases. Your pulse returns to its normal level.  After 8 hours, carbon monoxide levels in the blood return to normal. Your oxygen level increases.  After 24 hours, the chance of having a heart attack starts to decrease. Your breath, hair, and body stop smelling like smoke.  After 48 hours, damaged nerve endings begin to recover. Your sense of taste and smell improve.  After 72 hours, the body is virtually free of nicotine. Your bronchial tubes relax and breathing becomes easier.  After 2 to 12 weeks, lungs can hold more air. Exercise becomes easier and circulation improves.  The risk of having a heart attack, stroke, cancer,   or lung disease is greatly reduced.  After 1 year, the risk of coronary heart disease is cut in half.  After 5 years, the risk of stroke falls to the same as a nonsmoker.  After 10 years, the risk of lung cancer is cut in half and the risk of other cancers decreases significantly.  After 15 years, the risk of coronary heart disease drops, usually to the level of a nonsmoker.  If you are pregnant, quitting smoking will improve your chances of having a healthy baby.  The people you live with, especially any children, will be healthier.  You will have extra money to spend on things other than cigarettes. QUESTIONS TO THINK ABOUT BEFORE ATTEMPTING TO QUIT You may want to talk about your answers with your health care  provider.  Why do you want to quit?  If you tried to quit in the past, what helped and what did not?  What will be the most difficult situations for you after you quit? How will you plan to handle them?  Who can help you through the tough times? Your family? Friends? A health care provider?  What pleasures do you get from smoking? What ways can you still get pleasure if you quit? Here are some questions to ask your health care provider:  How can you help me to be successful at quitting?  What medicine do you think would be best for me and how should I take it?  What should I do if I need more help?  What is smoking withdrawal like? How can I get information on withdrawal? GET READY  Set a quit date.  Change your environment by getting rid of all cigarettes, ashtrays, matches, and lighters in your home, car, or work. Do not let people smoke in your home.  Review your past attempts to quit. Think about what worked and what did not. GET SUPPORT AND ENCOURAGEMENT You have a better chance of being successful if you have help. You can get support in many ways.  Tell your family, friends, and coworkers that you are going to quit and need their support. Ask them not to smoke around you.  Get individual, group, or telephone counseling and support. Programs are available at local hospitals and health centers. Call your local health department for information about programs in your area.  Spiritual beliefs and practices may help some smokers quit.  Download a "quit meter" on your computer to keep track of quit statistics, such as how long you have gone without smoking, cigarettes not smoked, and money saved.  Get a self-help book about quitting smoking and staying off tobacco. LEARN NEW SKILLS AND BEHAVIORS  Distract yourself from urges to smoke. Talk to someone, go for a walk, or occupy your time with a task.  Change your normal routine. Take a different route to work. Drink tea  instead of coffee. Eat breakfast in a different place.  Reduce your stress. Take a hot bath, exercise, or read a book.  Plan something enjoyable to do every day. Reward yourself for not smoking.  Explore interactive web-based programs that specialize in helping you quit. GET MEDICINE AND USE IT CORRECTLY Medicines can help you stop smoking and decrease the urge to smoke. Combining medicine with the above behavioral methods and support can greatly increase your chances of successfully quitting smoking.  Nicotine replacement therapy helps deliver nicotine to your body without the negative effects and risks of smoking. Nicotine replacement therapy includes nicotine gum, lozenges, inhalers,   nasal sprays, and skin patches. Some may be available over-the-counter and others require a prescription.  Antidepressant medicine helps people abstain from smoking, but how this works is unknown. This medicine is available by prescription.  Nicotinic receptor partial agonist medicine simulates the effect of nicotine in your brain. This medicine is available by prescription. Ask your health care provider for advice about which medicines to use and how to use them based on your health history. Your health care provider will tell you what side effects to look out for if you choose to be on a medicine or therapy. Carefully read the information on the package. Do not use any other product containing nicotine while using a nicotine replacement product.  RELAPSE OR DIFFICULT SITUATIONS Most relapses occur within the first 3 months after quitting. Do not be discouraged if you start smoking again. Remember, most people try several times before finally quitting. You may have symptoms of withdrawal because your body is used to nicotine. You may crave cigarettes, be irritable, feel very hungry, cough often, get headaches, or have difficulty concentrating. The withdrawal symptoms are only temporary. They are strongest when you  first quit, but they will go away within 10-14 days. To reduce the chances of relapse, try to:  Avoid drinking alcohol. Drinking lowers your chances of successfully quitting.  Reduce the amount of caffeine you consume. Once you quit smoking, the amount of caffeine in your body increases and can give you symptoms, such as a rapid heartbeat, sweating, and anxiety.  Avoid smokers because they can make you want to smoke.  Do not let weight gain distract you. Many smokers will gain weight when they quit, usually less than 10 pounds. Eat a healthy diet and stay active. You can always lose the weight gained after you quit.  Find ways to improve your mood other than smoking. FOR MORE INFORMATION  www.smokefree.gov  Document Released: 04/30/2001 Document Revised: 09/20/2013 Document Reviewed: 08/15/2011 [employer] Patient Information 2015 [employer]. This information is not intended to replace advice given to you by your health care provider. Make sure you discuss any questions you have with your health care provider.  

## 2014-02-15 NOTE — Progress Notes (Signed)
[facility] [place] HISTORY AND PHYSICAL -PAD  History of Present Illness Logan [name] is a 68 y.o. complicated male patient of Dr. Kellie Simmering has an aortobifemoral bypass graft with occlusion of the right limb. He also has a left-to-right femoral-femoral bypass graft on 12/20/11. He had a history of a draining sinus from the left inguinal area. This closed without any evidence of residual infection. Patient developed a scrotal abscess apparently at his 2013 visit and had a small draining sinus lateral to the right inguinal incision the patient was referred for further evaluation. He states he has a recurrent scrotal abscess now, he reports that his glucose runs in the 200's, that he ran out of Novolog, is still taking Lantus.  He has claudication in both calves with standing and walking only about 20 feet, relieved by rest; this severe claudication started when he ran out of his short acting insulin in May or June and his his serum glucose increased significantly; he denies non healing wounds. He admits that he is long overdue to see his PCP.  Pt denies any history of stroke or TIA. He has cardiac stents, did not experience any symptoms of an MI, but was told that he had an MI.  The patient denies New Medical or Surgical History.  Pt Diabetic: Yes, states his last A1C was 9.? Pt smoker: smoker  (1 cigar every 2-4 days, stopped cigarettes in 2012)  Pt meds include: Statin :Yes ASA: Yes Other anticoagulants/antiplatelets: he states that he may be taking clopidogrel, prescribed by Dr. Brigitte Pulse for his PAD, per pt  Past Medical History  Diagnosis Date  . Overweight(278.02)   . Dyslipidemia   . Cardiomyopathy   . Diabetes mellitus   . PVD (peripheral vascular disease)     right to left fem -fem bypass 1997/ aortabifemoral bypass 2005 right to left fem-fem 07/2004. Secondary to occluded righ tlimb of aortobifemoral bypass  . Tobacco abuse   . Anginal pain   . Hypertension   .  Shortness of breath   . Coronary artery disease     taxis DES stent circumflex 02/2003.... residual total distal circumflex and 50% LAD/ cardiolite 08/2006 inferior scar no ischemia  . Degenerative disc disease, lumbar   . Coronary artery disease   . Myocardial infarction     Social History History  Substance Use Topics  . Smoking status: Current Some Day Smoker -- 40 years    Types: Cigars    Last Attempt to Quit: 12/20/2011  . Smokeless tobacco: Never Used  . Alcohol Use: Yes     Comment: liquior sometimes    Family History Family History  Problem Relation Age of Onset  . Hypertension Mother   . Varicose Veins Mother   . Hypertension Brother     Past Surgical History  Procedure Laterality Date  . Knee surgery    . Axillary-femoral bypass graft  12/20/2011    Procedure: BYPASS GRAFT AXILLA-BIFEMORAL;  Surgeon: Conrad Americus, MD;  Location: [facility];  Service: Vascular;  Laterality: Bilateral;  Left aorta bilateral femoral thrombectomy, femoral to femoral thrombectomy, and revision of Femoral to femoral graft. and Thrombectomy of Left tibial artery.  . Fasciotomy  12/20/2011    Procedure: FASCIOTOMY;  Surgeon: Conrad Brownton, MD;  Location: [facility];  Service: Vascular;  Laterality: Right;  right calf facsicotomy.  . Lower extremity angiogram  12/20/2011    Procedure: LOWER EXTREMITY ANGIOGRAM;  Surgeon: Conrad Rogers, MD;  Location: [facility];  Service: Vascular;  Laterality: Bilateral;  Intraoperative Abdominal Aortogram with bilateral runoff  . Left-to-right femoral-femoral bypass graft  07-30-2004  DR [facility]    OCCLUSION RIGHT LIMB AORTOBIFEMORAL BYPASS GRAFT  W/ SEVERE CLAUDICATION  . Aorto to right common femoral and left profunda femoris bypass graft  09-28-2003 DR [facility]    SEVERE AORTOILIAC OCCLUSIVE DISEASE W/ OCCLUDED FEM-FEM BYPASS OF BOTH LOWER EXTREMITIES  . Right lateral parotidectomy with facial verve preservation  02-25-2001  DR [facility]    NEOPLASM RIGHT PAROTID GLAND  . Coronary  angioplasty with stent placement  02-23-2003  DR [facility]    PCI W/ STENTING PROXIMAL CIRCUMFLEX  . Femoral-femoral bypass graft  1997  DR [facility]    RIGHT TO LEFT  . Debridement leg  12/25/11  . Incision and drainage of wound  02/17/2012    Procedure: IRRIGATION AND DEBRIDEMENT WOUND;  Surgeon: Theodoro Kos, DO;  Location: [facility];  Service: Plastics;  Laterality: Bilateral;  . Lumbar laminectomy/decompression microdiscectomy  06/02/2012    Procedure: LUMBAR LAMINECTOMY/DECOMPRESSION MICRODISCECTOMY 1 LEVEL;  Surgeon: Tobi Bastos, MD;  Location: [facility];  Service: Orthopedics;  Laterality: N/A;  Central Decompression Lumbar Laminectomy L5-S1   . Spine surgery      Allergies  Allergen Reactions  . Aspirin Hives  . Penicillins Rash and Other (See Comments)    Immune system shut down     Current Outpatient Prescriptions  Medication Sig Dispense Refill  . amLODipine (NORVASC) 2.5 MG tablet Take 2.5 mg by mouth every morning.       Marland Kitchen aspirin EC 81 MG tablet Take 81 mg by mouth every morning.       . diazepam (VALIUM) 5 MG tablet Take 5 mg by mouth 2 (two) times daily as needed. Back spasms.      . indomethacin (INDOCIN) 50 MG capsule Take 50 mg by mouth daily as needed. Gout flare-up.      Marland Kitchen insulin glargine (LANTUS) 100 UNIT/ML injection Inject 4 Units into the skin daily.  10 mL  12  . lisinopril (PRINIVIL,ZESTRIL) 40 MG tablet Take 40 mg by mouth every morning.       . metFORMIN (GLUCOPHAGE) 500 MG tablet Take 500 mg by mouth 2 (two) times daily with a meal.       . metoprolol (LOPRESSOR) 50 MG tablet Take 50 mg by mouth 2 (two) times daily.      . Multiple Vitamins-Minerals (MULTIVITAMIN PO) Take 1 tablet by mouth every morning.       . mupirocin ointment (BACTROBAN) 2 % Intranasally twice daily for 7 days with antibiotic wash  30 g  2  . Omega-3 Fatty Acids (FISH OIL) 1000 MG CAPS Take 1 capsule by mouth 2 (two) times daily.       Marland Kitchen saccharomyces boulardii  (FLORASTOR) 250 MG capsule Take 1 capsule (250 mg total) by mouth 2 (two) times daily.  1 capsule  30  . chlorhexidine (HIBICLENS) 4 % external liquid Wash head to toe in shower once daily at night for 7 days, no deodorant creams until one hour after this shower  237 mL  3  . insulin aspart (NOVOLOG) 100 UNIT/ML injection CBG 70 - 120: 0 units CBG 121 - 150: 2 units CBG 151 - 200: 3 units CBG 201 - 250: 5 units CBG 251 - 300: 8 units  Call MD if CBg is greater than 200  1 vial  0  . methocarbamol (ROBAXIN) 500 MG tablet Take 1  tablet (500 mg total) by mouth every 6 (six) hours as needed.  40 tablet  1  . oxyCODONE-acetaminophen (PERCOCET) 10-325 MG per tablet       . polyethylene glycol (MIRALAX / GLYCOLAX) packet Take 17 g by mouth daily as needed.  14 each  0  . simvastatin (ZOCOR) 20 MG tablet Take 20 mg by mouth every evening.       No current facility-administered medications for this visit.    ROS: See HPI for pertinent positives and negatives.   Physical Examination  Filed Vitals:   02/15/14 1605  BP: 126/75  Pulse: 75  Resp: 16  Height: 5\' 9"  (1.753 m)  Weight: 206 lb (93.441 kg)  SpO2: 99%   Body mass index is 30.41 kg/(m^2).  General: A&O x 3, WDWN, obese male. Gait: normal Eyes: PERRLA. Pulmonary: CTAB, without wheezes , rales or rhonchi, distant posterior breath sounds. Cardiac: regular Rythm , without detected murmur.         Carotid Bruits Right Left   Negative Negative  Aorta is not palpable. Radial pulses: are 2+ palpable and =                           VASCULAR EXAM: Extremities with ischemic changes: proximal portion of toes of both feet are dusky, cool, without Gangrene; without open wounds.                                                                                                          LE Pulses Right Left       FEMORAL  not palpable  not palpable        POPLITEAL  not palpable   not palpable       POSTERIOR TIBIAL  not palpable,  monophasic by Doppler   not palpable, monophasic by Doppler        DORSALIS PEDIS      ANTERIOR TIBIAL not palpable, absent on Doppler  not palpable, absent on Doppler        PERONEAL monophasic by Doppler and not Palpable   Absent by Doppler and not Palpable    Abdomen: soft, NT, no masses palpated. Skin: no rashes, no ulcers noted. Musculoskeletal: no muscle wasting or atrophy.  Neurologic: A&O X 3; Appropriate Affect ; SENSATION: normal; MOTOR FUNCTION:  moving all extremities equally, motor strength 5/5 throughout. Speech is fluent/normal.  CN 2-12 intact.    Non-Invasive Vascular Imaging: DATE: 02/15/2014 ABI: RIGHT 0.40, Waveforms: monophasic and absent;  LEFT 0.44, Waveforms: monophasic and absent. Previous (01/10/12) ABI's: Right: 0.66, Left: 0.70   ASSESSMENT: KIRIL HIPPE is a 68 y.o. male who is s/p  aortobifemoral bypass graft with occlusion of the right limb. He also has a left-to-right femoral-femoral bypass graft on 12/20/11.  He returns today at the insistence of his family due to his severe claudication since May or June this year at which time his serum glucose increased signi. He ran out of his short acting insulin in May or June and  is apparently overdue to see his PCP; I advised him to see his PCP ASAP as patient needs to get his blood sugar under control to decrease his risk of atherosclerotic progression. Dr. Donnetta Hutching spoke with and examined patient. Significant decrease in bilateral ABI's, from moderate arterial occlusive disease to severe compared to ABI's 2 years ago.   PLAN:  I discussed in depth with the patient the nature of atherosclerosis, and emphasized the importance of maximal medical management including strict control of blood pressure, blood glucose, and lipid levels, obtaining regular exercise, and cessation of smoking.  The patient is aware that without maximal medical management the underlying atherosclerotic disease process will progress, limiting  the benefit of any interventions.  Based on the patient's vascular studies and examination, pt will be scheduled for a CTA with run off, and follow up with Dr. Kellie Simmering this week or next week.  The patient was given information about PAD including signs, symptoms, treatment, what symptoms should prompt the patient to seek immediate medical care, and risk reduction measures to take.  Clemon Chambers, RN, MSN, FNP-C [facility] Phone: [phone]  Clinic MD: Early  02/15/2014 4:18 PM

## 2014-02-16 ENCOUNTER — Telehealth: Payer: Self-pay | Admitting: Vascular Surgery

## 2014-02-16 NOTE — Addendum Note (Signed)
Addended by: Peter Minium K on: 02/16/2014 11:30 AM   Modules accepted: Orders

## 2014-02-16 NOTE — Telephone Encounter (Signed)
Spoke with pt - CTA to be done at [facility] 02/22/14 9 am. NPO 4 hrs FU with [facility] at 2pm on 02/22/14. Pt verbalized understanding - kf

## 2014-02-17 DIAGNOSIS — E119 Type 2 diabetes mellitus without complications: Secondary | ICD-10-CM | POA: Diagnosis not present

## 2014-02-17 DIAGNOSIS — B3742 Candidal balanitis: Secondary | ICD-10-CM | POA: Diagnosis not present

## 2014-02-17 DIAGNOSIS — Z9114 Patient's other noncompliance with medication regimen: Secondary | ICD-10-CM | POA: Diagnosis not present

## 2014-02-21 ENCOUNTER — Encounter: Payer: Self-pay | Admitting: Vascular Surgery

## 2014-02-22 ENCOUNTER — Ambulatory Visit (HOSPITAL_COMMUNITY)
Admission: RE | Admit: 2014-02-22 | Discharge: 2014-02-22 | Disposition: A | Discharge status: Home / Self Care | Payer: Medicare Other | Source: Ambulatory Visit | Attending: Family | Admitting: Family

## 2014-02-22 ENCOUNTER — Encounter (HOSPITAL_COMMUNITY): Payer: Self-pay

## 2014-02-22 ENCOUNTER — Ambulatory Visit (INDEPENDENT_AMBULATORY_CARE_PROVIDER_SITE_OTHER): Payer: Medicare Other | Admitting: Vascular Surgery

## 2014-02-22 ENCOUNTER — Encounter: Payer: Self-pay | Admitting: Vascular Surgery

## 2014-02-22 VITALS — BP 118/67 | HR 78 | Resp 16 | Ht 69.0 in | Wt 208.0 lb

## 2014-02-22 DIAGNOSIS — I739 Peripheral vascular disease, unspecified: Secondary | ICD-10-CM | POA: Insufficient documentation

## 2014-02-22 DIAGNOSIS — I70219 Atherosclerosis of native arteries of extremities with intermittent claudication, unspecified extremity: Secondary | ICD-10-CM | POA: Insufficient documentation

## 2014-02-22 DIAGNOSIS — Z48812 Encounter for surgical aftercare following surgery on the circulatory system: Secondary | ICD-10-CM | POA: Diagnosis not present

## 2014-02-22 DIAGNOSIS — I70213 Atherosclerosis of native arteries of extremities with intermittent claudication, bilateral legs: Secondary | ICD-10-CM | POA: Diagnosis not present

## 2014-02-22 DIAGNOSIS — M79662 Pain in left lower leg: Secondary | ICD-10-CM

## 2014-02-22 DIAGNOSIS — I70312 Atherosclerosis of unspecified type of bypass graft(s) of the extremities with intermittent claudication, left leg: Secondary | ICD-10-CM | POA: Diagnosis not present

## 2014-02-22 LAB — POCT I-STAT CREATININE: Creatinine, Ser: 1.1 mg/dL (ref 0.50–1.35)

## 2014-02-22 MED ORDER — IOHEXOL 350 MG/ML SOLN
100.0000 mL | Freq: Once | INTRAVENOUS | Status: AC | PRN
  Administered 2014-02-22: 100 mL via INTRAVENOUS

## 2014-02-22 NOTE — Progress Notes (Signed)
Subjective:     Patient ID: Logan [name], male   DOB: 04/15/1946, 68 y.o.   MRN: [id number]  HPI this patient is well-known in a having undergone aortobifemoral bypass grafting in the remote past and having a known right limb occlusion with left to right femoral-femoral bypass. He developed a draining sinus in the left inguinal area and we were concerned that this might require removal of the graft. That did eventually heal. He developed worsening claudication about 4-6 weeks ago in both legs from the calf to the buttock. He has no rest pain or history of nonhealing ulcers in the lower 20s. He is able to ambulate 50-100 yards without difficulty. He has no numbness in the feet.  Past Medical History  Diagnosis Date  . Overweight(278.02)   . Dyslipidemia   . Cardiomyopathy   . Diabetes mellitus   . PVD (peripheral vascular disease)     right to left fem -fem bypass 1997/ aortabifemoral bypass 2005 right to left fem-fem 07/2004. Secondary to occluded righ tlimb of aortobifemoral bypass  . Tobacco abuse   . Anginal pain   . Hypertension   . Shortness of breath   . Coronary artery disease     taxis DES stent circumflex 02/2003.... residual total distal circumflex and 50% LAD/ cardiolite 08/2006 inferior scar no ischemia  . Degenerative disc disease, lumbar   . Coronary artery disease   . Myocardial infarction     History  Substance Use Topics  . Smoking status: Current Some Day Smoker -- 40 years    Types: Cigars    Last Attempt to Quit: 12/20/2011  . Smokeless tobacco: Never Used  . Alcohol Use: Yes     Comment: liquior sometimes    Family History  Problem Relation Age of Onset  . Hypertension Mother   . Varicose Veins Mother   . Hypertension Brother     Allergies  Allergen Reactions  . Aspirin Hives  . Penicillins Rash and Other (See Comments)    Immune system shut down     Current outpatient prescriptions:amLODipine (NORVASC) 2.5 MG tablet, Take 2.5 mg by mouth every  morning. , Disp: , Rfl: ;  aspirin EC 81 MG tablet, Take 81 mg by mouth every morning. , Disp: , Rfl: ;  chlorhexidine (HIBICLENS) 4 % external liquid, Wash head to toe in shower once daily at night for 7 days, no deodorant creams until one hour after this shower, Disp: 237 mL, Rfl: 3 diazepam (VALIUM) 5 MG tablet, Take 5 mg by mouth 2 (two) times daily as needed. Back spasms., Disp: , Rfl: ;  indomethacin (INDOCIN) 50 MG capsule, Take 50 mg by mouth daily as needed. Gout flare-up., Disp: , Rfl: ;  insulin aspart (NOVOLOG) 100 UNIT/ML injection, CBG 70 - 120: 0 units CBG 121 - 150: 2 units CBG 151 - 200: 3 units CBG 201 - 250: 5 units CBG 251 - 300: 8 units  Call MD if CBg is greater than 200, Disp: 1 vial, Rfl: 0 insulin glargine (LANTUS) 100 UNIT/ML injection, Inject 4 Units into the skin daily., Disp: 10 mL, Rfl: 12;  lisinopril (PRINIVIL,ZESTRIL) 40 MG tablet, Take 40 mg by mouth every morning. , Disp: , Rfl: ;  metFORMIN (GLUCOPHAGE) 500 MG tablet, Take 500 mg by mouth 2 (two) times daily with a meal. , Disp: , Rfl: ;  methocarbamol (ROBAXIN) 500 MG tablet, Take 1 tablet (500 mg total) by mouth every 6 (six) hours as needed., Disp: 40 tablet, Rfl:  1 metoprolol (LOPRESSOR) 50 MG tablet, Take 50 mg by mouth 2 (two) times daily., Disp: , Rfl: ;  Multiple Vitamins-Minerals (MULTIVITAMIN PO), Take 1 tablet by mouth every morning. , Disp: , Rfl: ;  mupirocin ointment (BACTROBAN) 2 %, Intranasally twice daily for 7 days with antibiotic wash, Disp: 30 g, Rfl: 2;  Omega-3 Fatty Acids (FISH OIL) 1000 MG CAPS, Take 1 capsule by mouth 2 (two) times daily. , Disp: , Rfl:  oxyCODONE-acetaminophen (PERCOCET) 10-325 MG per tablet, , Disp: , Rfl: ;  polyethylene glycol (MIRALAX / GLYCOLAX) packet, Take 17 g by mouth daily as needed., Disp: 14 each, Rfl: 0;  saccharomyces boulardii (FLORASTOR) 250 MG capsule, Take 1 capsule (250 mg total) by mouth 2 (two) times daily., Disp: 1 capsule, Rfl: 30;  simvastatin (ZOCOR) 20 MG  tablet, Take 20 mg by mouth every evening., Disp: , Rfl:   BP 118/67  Pulse 78  Resp 16  Ht 5\' 9"  (1.753 m)  Wt 208 lb (94.348 kg)  BMI 30.70 kg/m2  Body mass index is 30.7 kg/(m^2).           Review of Systems patient has had recent scrotal abscess which has been followed by Dr.Shah    Objective:   Physical Exam BP 118/67  Pulse 78  Resp 16  Ht 5\' 9"  (1.753 m)  Wt 208 lb (94.348 kg)  BMI 30.70 kg/m2  General well-developed well-nourished male in no apparent stress alert and oriented x3 Abdomen soft nontender Both inguinal areas examined. Note draining sinus at the present time. No fluctuance noted. Absent femoral pulses bilaterally. Both feet adequately perfused with no evidence of cellulitis or infection.  Today I ordered a CT angiogram of abdomen and pelvis which are reviewed and interpreted. Elective this by computer. The aortic graft is totally occluded with patent renal arteries bilaterally. There is good runoff below the inguinal area bilaterally. ABIs are between 0.4 and 0.45 bilaterally.      Assessment:     Patient with occlusion of aortobifemoral graft with history of draining sinus and left inguinal area. No active infection at the present time. No limb threatening ischemia at the present time. Patient is tolerating occlusion of the graft.    Plan:     Will not plan any inflow procedure as long as patient does not have limb threatening ischemia because of high likelihood of infection. He would need axillofemoral graft and high likelihood that left inguinal area would become infected since there has been infection there in the past. Discusses with patient and he is in agreement with that. He will return in 6 months with repeat ABIs unless symptoms worsen.

## 2014-02-23 ENCOUNTER — Other Ambulatory Visit: Payer: Self-pay | Admitting: *Deleted

## 2014-02-23 DIAGNOSIS — I739 Peripheral vascular disease, unspecified: Secondary | ICD-10-CM

## 2014-02-24 DIAGNOSIS — Z87891 Personal history of nicotine dependence: Secondary | ICD-10-CM | POA: Diagnosis not present

## 2014-02-24 DIAGNOSIS — L97121 Non-pressure chronic ulcer of left thigh limited to breakdown of skin: Secondary | ICD-10-CM | POA: Diagnosis not present

## 2014-03-01 ENCOUNTER — Telehealth: Payer: Self-pay

## 2014-03-01 DIAGNOSIS — M79672 Pain in left foot: Secondary | ICD-10-CM | POA: Diagnosis not present

## 2014-03-01 DIAGNOSIS — M25579 Pain in unspecified ankle and joints of unspecified foot: Secondary | ICD-10-CM | POA: Diagnosis not present

## 2014-03-01 NOTE — Telephone Encounter (Signed)
PATIENT CALLED TO SCHEDULE COLONOSCOPY PLEASE CALL [phone]

## 2014-03-02 DIAGNOSIS — L98499 Non-pressure chronic ulcer of skin of other sites with unspecified severity: Secondary | ICD-10-CM | POA: Diagnosis not present

## 2014-03-02 DIAGNOSIS — E11622 Type 2 diabetes mellitus with other skin ulcer: Secondary | ICD-10-CM | POA: Diagnosis not present

## 2014-03-03 ENCOUNTER — Telehealth: Payer: Self-pay

## 2014-03-03 NOTE — Telephone Encounter (Signed)
Patient called to schedule colonoscopy [phone]

## 2014-03-16 NOTE — Addendum Note (Signed)
Addended by: Marlou Porch on: 03/16/2014 02:20 PM   Modules accepted: Orders

## 2014-03-16 NOTE — Telephone Encounter (Signed)
Gastroenterology Pre-Procedure Review  Request Date: Requesting Physician: Brigitte Pulse  PATIENT REVIEW QUESTIONS: The patient responded to the following health history questions as indicated:    1. Diabetes Melitis: YES 2. Joint replacements in the past 12 months: NO 3. Major health problems in the past 3 months: NO 4. Has an artificial valve or MVP: NO 5. Has a defibrillator: NO 6. Has been advised in past to take antibiotics in advance of a procedure like teeth cleaning: NO 7.Alcohol: for hoildays 8.Family history colon cancer: NO    MEDICATIONS & ALLERGIES:    Patient reports the following regarding taking any blood thinners:   Plavix? NO Aspirin? NO Coumadin? NO  Patient confirms/reports the following medications:  Current Outpatient Prescriptions  Medication Sig Dispense Refill  . amLODipine (NORVASC) 2.5 MG tablet Take 2.5 mg by mouth every morning.       Marland Kitchen aspirin EC 81 MG tablet Take 81 mg by mouth every morning.       . chlorhexidine (HIBICLENS) 4 % external liquid Wash head to toe in shower once daily at night for 7 days, no deodorant creams until one hour after this shower  237 mL  3  . diazepam (VALIUM) 5 MG tablet Take 5 mg by mouth 2 (two) times daily as needed. Back spasms.      . indomethacin (INDOCIN) 50 MG capsule Take 50 mg by mouth daily as needed. Gout flare-up.      Marland Kitchen insulin aspart (NOVOLOG) 100 UNIT/ML injection CBG 70 - 120: 0 units CBG 121 - 150: 2 units CBG 151 - 200: 3 units CBG 201 - 250: 5 units CBG 251 - 300: 8 units  Call MD if CBg is greater than 200  1 vial  0  . insulin glargine (LANTUS) 100 UNIT/ML injection Inject 50 Units into the skin daily.      Marland Kitchen lisinopril (PRINIVIL,ZESTRIL) 40 MG tablet Take 40 mg by mouth every morning.       . metFORMIN (GLUCOPHAGE) 500 MG tablet Take 500 mg by mouth 2 (two) times daily with a meal.       . methocarbamol (ROBAXIN) 500 MG tablet Take 1 tablet (500 mg total) by mouth every 6 (six) hours as needed.  40 tablet   1  . metoprolol (LOPRESSOR) 50 MG tablet Take 50 mg by mouth 2 (two) times daily.      . Multiple Vitamins-Minerals (MULTIVITAMIN PO) Take 1 tablet by mouth every morning.       . mupirocin ointment (BACTROBAN) 2 % Intranasally twice daily for 7 days with antibiotic wash  30 g  2  . Omega-3 Fatty Acids (FISH OIL) 1000 MG CAPS Take 1 capsule by mouth 2 (two) times daily.       Marland Kitchen oxyCODONE-acetaminophen (PERCOCET) 10-325 MG per tablet       . polyethylene glycol (MIRALAX / GLYCOLAX) packet Take 17 g by mouth daily as needed.  14 each  0  . simvastatin (ZOCOR) 20 MG tablet Take 20 mg by mouth every evening.      . saccharomyces boulardii (FLORASTOR) 250 MG capsule Take 1 capsule (250 mg total) by mouth 2 (two) times daily.  1 capsule  30   No current facility-administered medications for this visit.    Patient confirms/reports the following allergies:  Allergies  Allergen Reactions  . Aspirin Hives  . Penicillins Rash and Other (See Comments)    Immune system shut down     No orders of the defined types  were placed in this encounter.    AUTHORIZATION INFORMATION Primary Insurance: [employer]  ID #: [id number]  Group #: [id number] Pre-Cert / Josem Kaufmann required:  Pre-Cert / Auth #:   SCHEDULE INFORMATION: Procedure has been scheduled as follows:  Date:  Time:  Location:   This Gastroenterology Pre-Precedure Review Form is being routed to the following provider(s):   RMR patient

## 2014-03-18 NOTE — Telephone Encounter (Signed)
Pt has Hx of adenoma polyp and has OV with Neil Crouch, PA on 04/11/2014 at 9:00 AM and he is aware.

## 2014-03-18 NOTE — Telephone Encounter (Signed)
See separate triage.  

## 2014-03-28 DIAGNOSIS — E11329 Type 2 diabetes mellitus with mild nonproliferative diabetic retinopathy without macular edema: Secondary | ICD-10-CM | POA: Diagnosis not present

## 2014-03-28 DIAGNOSIS — H25813 Combined forms of age-related cataract, bilateral: Secondary | ICD-10-CM | POA: Diagnosis not present

## 2014-03-28 DIAGNOSIS — H40013 Open angle with borderline findings, low risk, bilateral: Secondary | ICD-10-CM | POA: Diagnosis not present

## 2014-03-28 DIAGNOSIS — H5203 Hypermetropia, bilateral: Secondary | ICD-10-CM | POA: Diagnosis not present

## 2014-03-30 DIAGNOSIS — L089 Local infection of the skin and subcutaneous tissue, unspecified: Secondary | ICD-10-CM | POA: Diagnosis not present

## 2014-04-11 ENCOUNTER — Encounter: Payer: Self-pay | Admitting: Gastroenterology

## 2014-04-11 ENCOUNTER — Ambulatory Visit: Payer: Medicare Other | Admitting: Gastroenterology

## 2014-04-11 ENCOUNTER — Encounter: Payer: Self-pay | Admitting: Internal Medicine

## 2014-04-11 ENCOUNTER — Telehealth: Payer: Self-pay | Admitting: Internal Medicine

## 2014-04-11 NOTE — Telephone Encounter (Signed)
PATIENT WAS A NO SHOW ON 04/11/14 AND LETTER SENT

## 2014-04-15 DIAGNOSIS — M79675 Pain in left toe(s): Secondary | ICD-10-CM | POA: Diagnosis not present

## 2014-04-15 DIAGNOSIS — M109 Gout, unspecified: Secondary | ICD-10-CM | POA: Diagnosis not present

## 2014-04-27 DIAGNOSIS — L089 Local infection of the skin and subcutaneous tissue, unspecified: Secondary | ICD-10-CM | POA: Diagnosis not present

## 2014-05-04 ENCOUNTER — Encounter (HOSPITAL_COMMUNITY): Payer: Self-pay | Admitting: Family Medicine

## 2014-05-04 ENCOUNTER — Emergency Department (HOSPITAL_COMMUNITY): Payer: Medicare Other

## 2014-05-04 ENCOUNTER — Emergency Department (HOSPITAL_COMMUNITY)
Admission: EM | Admit: 2014-05-04 | Discharge: 2014-05-04 | Disposition: A | Discharge status: Home / Self Care | Payer: Medicare Other | Attending: Emergency Medicine | Admitting: Emergency Medicine

## 2014-05-04 ENCOUNTER — Telehealth: Payer: Self-pay

## 2014-05-04 DIAGNOSIS — M79675 Pain in left toe(s): Secondary | ICD-10-CM | POA: Diagnosis not present

## 2014-05-04 DIAGNOSIS — B999 Unspecified infectious disease: Secondary | ICD-10-CM

## 2014-05-04 DIAGNOSIS — Z791 Long term (current) use of non-steroidal anti-inflammatories (NSAID): Secondary | ICD-10-CM | POA: Diagnosis not present

## 2014-05-04 DIAGNOSIS — Z7982 Long term (current) use of aspirin: Secondary | ICD-10-CM | POA: Diagnosis not present

## 2014-05-04 DIAGNOSIS — I739 Peripheral vascular disease, unspecified: Secondary | ICD-10-CM | POA: Diagnosis not present

## 2014-05-04 DIAGNOSIS — L97524 Non-pressure chronic ulcer of other part of left foot with necrosis of bone: Secondary | ICD-10-CM | POA: Diagnosis not present

## 2014-05-04 DIAGNOSIS — I252 Old myocardial infarction: Secondary | ICD-10-CM | POA: Insufficient documentation

## 2014-05-04 DIAGNOSIS — Z794 Long term (current) use of insulin: Secondary | ICD-10-CM | POA: Diagnosis not present

## 2014-05-04 DIAGNOSIS — E780 Pure hypercholesterolemia: Secondary | ICD-10-CM | POA: Diagnosis not present

## 2014-05-04 DIAGNOSIS — B353 Tinea pedis: Secondary | ICD-10-CM | POA: Insufficient documentation

## 2014-05-04 DIAGNOSIS — I10 Essential (primary) hypertension: Secondary | ICD-10-CM | POA: Insufficient documentation

## 2014-05-04 DIAGNOSIS — L739 Follicular disorder, unspecified: Secondary | ICD-10-CM | POA: Diagnosis not present

## 2014-05-04 DIAGNOSIS — E1152 Type 2 diabetes mellitus with diabetic peripheral angiopathy with gangrene: Secondary | ICD-10-CM | POA: Diagnosis not present

## 2014-05-04 DIAGNOSIS — Z792 Long term (current) use of antibiotics: Secondary | ICD-10-CM | POA: Insufficient documentation

## 2014-05-04 DIAGNOSIS — Z9861 Coronary angioplasty status: Secondary | ICD-10-CM | POA: Diagnosis not present

## 2014-05-04 DIAGNOSIS — E1165 Type 2 diabetes mellitus with hyperglycemia: Secondary | ICD-10-CM | POA: Insufficient documentation

## 2014-05-04 DIAGNOSIS — I25119 Atherosclerotic heart disease of native coronary artery with unspecified angina pectoris: Secondary | ICD-10-CM | POA: Diagnosis not present

## 2014-05-04 DIAGNOSIS — E11621 Type 2 diabetes mellitus with foot ulcer: Secondary | ICD-10-CM | POA: Diagnosis not present

## 2014-05-04 DIAGNOSIS — Z79899 Other long term (current) drug therapy: Secondary | ICD-10-CM | POA: Insufficient documentation

## 2014-05-04 DIAGNOSIS — L97529 Non-pressure chronic ulcer of other part of left foot with unspecified severity: Secondary | ICD-10-CM | POA: Diagnosis not present

## 2014-05-04 DIAGNOSIS — Z833 Family history of diabetes mellitus: Secondary | ICD-10-CM | POA: Diagnosis not present

## 2014-05-04 DIAGNOSIS — Z79891 Long term (current) use of opiate analgesic: Secondary | ICD-10-CM | POA: Diagnosis not present

## 2014-05-04 DIAGNOSIS — Z880 Allergy status to penicillin: Secondary | ICD-10-CM | POA: Insufficient documentation

## 2014-05-04 DIAGNOSIS — F17200 Nicotine dependence, unspecified, uncomplicated: Secondary | ICD-10-CM | POA: Diagnosis not present

## 2014-05-04 DIAGNOSIS — E785 Hyperlipidemia, unspecified: Secondary | ICD-10-CM | POA: Insufficient documentation

## 2014-05-04 DIAGNOSIS — Z720 Tobacco use: Secondary | ICD-10-CM | POA: Diagnosis not present

## 2014-05-04 DIAGNOSIS — G8929 Other chronic pain: Secondary | ICD-10-CM

## 2014-05-04 DIAGNOSIS — Z8249 Family history of ischemic heart disease and other diseases of the circulatory system: Secondary | ICD-10-CM | POA: Diagnosis not present

## 2014-05-04 DIAGNOSIS — E860 Dehydration: Secondary | ICD-10-CM

## 2014-05-04 LAB — BASIC METABOLIC PANEL
Anion gap: 15 (ref 5–15)
BUN: 23 mg/dL (ref 6–23)
CALCIUM: 10 mg/dL (ref 8.4–10.5)
CO2: 19 meq/L (ref 19–32)
CREATININE: 1.39 mg/dL — AB (ref 0.50–1.35)
Chloride: 100 mEq/L (ref 96–112)
GFR calc Af Amer: 58 mL/min — ABNORMAL LOW (ref >90)
GFR, EST NON AFRICAN AMERICAN: 50 mL/min — AB (ref >90)
GLUCOSE: 131 mg/dL — AB (ref 70–99)
Potassium: 4.6 mEq/L (ref 3.7–5.3)
SODIUM: 134 meq/L — AB (ref 137–147)

## 2014-05-04 LAB — CBC WITH DIFFERENTIAL/PLATELET
BASOS ABS: 0 10*3/uL (ref 0.0–0.1)
Basophils Relative: 0 % (ref 0–1)
EOS PCT: 3 % (ref 0–5)
Eosinophils Absolute: 0.2 10*3/uL (ref 0.0–0.7)
HCT: 38.9 % — ABNORMAL LOW (ref 39.0–52.0)
Hemoglobin: 13.4 g/dL (ref 13.0–17.0)
Lymphocytes Relative: 38 % (ref 12–46)
Lymphs Abs: 3.3 10*3/uL (ref 0.7–4.0)
MCH: 30.7 pg (ref 26.0–34.0)
MCHC: 34.4 g/dL (ref 30.0–36.0)
MCV: 89.2 fL (ref 78.0–100.0)
Monocytes Absolute: 0.7 10*3/uL (ref 0.1–1.0)
Monocytes Relative: 8 % (ref 3–12)
Neutro Abs: 4.4 10*3/uL (ref 1.7–7.7)
Neutrophils Relative %: 51 % (ref 43–77)
Platelets: 244 10*3/uL (ref 150–400)
RBC: 4.36 MIL/uL (ref 4.22–5.81)
RDW: 14.9 % (ref 11.5–15.5)
WBC: 8.6 10*3/uL (ref 4.0–10.5)

## 2014-05-04 MED ORDER — OXYCODONE-ACETAMINOPHEN 5-325 MG PO TABS: 1.0000 | ORAL_TABLET | ORAL | Status: DC | PRN

## 2014-05-04 MED ORDER — MORPHINE SULFATE 4 MG/ML IJ SOLN
4.0000 mg | Freq: Once | INTRAMUSCULAR | Status: AC
  Administered 2014-05-04: 4 mg via INTRAVENOUS
  Filled 2014-05-04: qty 1

## 2014-05-04 MED ORDER — ONDANSETRON HCL 4 MG/2ML IJ SOLN
4.0000 mg | Freq: Once | INTRAMUSCULAR | Status: AC
  Administered 2014-05-04: 4 mg via INTRAVENOUS
  Filled 2014-05-04: qty 2

## 2014-05-04 MED ORDER — DOXYCYCLINE HYCLATE 100 MG PO CAPS: 100.0000 mg | ORAL_CAPSULE | Freq: Two times a day (BID) | ORAL | Status: DC

## 2014-05-04 NOTE — ED Notes (Signed)
Pt here for left 5th toe possible infection. sts the toe has been draining.

## 2014-05-04 NOTE — ED Provider Notes (Signed)
CSN: [id number]     Arrival date & time 05/04/14  1509 History   First MD Initiated Contact with Patient 05/04/14 1730     Chief Complaint  Patient presents with  . Toe Pain     (Consider location/radiation/quality/duration/timing/severity/associated sxs/prior Treatment) HPI  Logan [name] is a 68 year old male with a past medical history of coronary and peripheral vascular disease, hypertension, previous myocardial infarction, status post bypass graft, insulin-dependent diabetes mellitus and history of noncompliance with medication regimen. The patient comes in today for evaluation of pain and discharge from the left 5th toe. Patient states that he was seen by a wound care doctor today in [place] in [place]. The physician that saw him was concerned for possible date. And had him come to the emergency department for further evaluation. The patient does have a history of gout and the patient states that this is different. He states he has had pain in this toe for more than one year. He complains of foul odor and peeling skin in between the fourth and fifth toes. He denies any nausea, vomiting, chills, myalgias, arthralgias, or other signs of systemic infection. He said his blood sugars have been running in the 200s. He continues to smoke intermittently.  Past Medical History  Diagnosis Date  . Overweight(278.02)   . Dyslipidemia   . Cardiomyopathy   . Diabetes mellitus   . PVD (peripheral vascular disease)     right to left fem -fem bypass 1997/ aortabifemoral bypass 2005 right to left fem-fem 07/2004. Secondary to occluded righ tlimb of aortobifemoral bypass  . Tobacco abuse   . Anginal pain   . Hypertension   . Shortness of breath   . Coronary artery disease     taxis DES stent circumflex 02/2003.... residual total distal circumflex and 50% LAD/ cardiolite 08/2006 inferior scar no ischemia  . Degenerative disc disease, lumbar   . Coronary artery disease   . Myocardial infarction     Past Surgical History  Procedure Laterality Date  . Knee surgery    . Axillary-femoral bypass graft  12/20/2011    Procedure: BYPASS GRAFT AXILLA-BIFEMORAL;  Surgeon: Conrad Whitehall, MD;  Location: [facility];  Service: Vascular;  Laterality: Bilateral;  Left aorta bilateral femoral thrombectomy, femoral to femoral thrombectomy, and revision of Femoral to femoral graft. and Thrombectomy of Left tibial artery.  . Fasciotomy  12/20/2011    Procedure: FASCIOTOMY;  Surgeon: Conrad [place]una Woods, MD;  Location: [facility];  Service: Vascular;  Laterality: Right;  right calf facsicotomy.  . Lower extremity angiogram  12/20/2011    Procedure: LOWER EXTREMITY ANGIOGRAM;  Surgeon: Conrad [place], MD;  Location: [facility];  Service: Vascular;  Laterality: Bilateral;  Intraoperative Abdominal Aortogram with bilateral runoff  . Left-to-right femoral-femoral bypass graft  07-30-2004  DR [facility]    OCCLUSION RIGHT LIMB AORTOBIFEMORAL BYPASS GRAFT  W/ SEVERE CLAUDICATION  . Aorto to right common femoral and left profunda femoris bypass graft  09-28-2003 DR LAWSON    SEVERE AORTOILIAC OCCLUSIVE DISEASE W/ OCCLUDED FEM-FEM BYPASS OF BOTH LOWER EXTREMITIES  . Right lateral parotidectomy with facial verve preservation  02-25-2001  DR [facility]    NEOPLASM RIGHT PAROTID GLAND  . Coronary angioplasty with stent placement  02-23-2003  DR [facility]    PCI W/ STENTING PROXIMAL CIRCUMFLEX  . Femoral-femoral bypass graft  1997  DR LAWSON    RIGHT TO LEFT  . Debridement leg  12/25/11  . Incision and drainage of wound  02/17/2012  Procedure: IRRIGATION AND DEBRIDEMENT WOUND;  Surgeon: Theodoro Kos, DO;  Location: [facility];  Service: Plastics;  Laterality: Bilateral;  . Lumbar laminectomy/decompression microdiscectomy  06/02/2012    Procedure: LUMBAR LAMINECTOMY/DECOMPRESSION MICRODISCECTOMY 1 LEVEL;  Surgeon: Tobi Bastos, MD;  Location: [facility];  Service: Orthopedics;  Laterality: N/A;  Central Decompression Lumbar Laminectomy L5-S1    . Spine surgery     Family History  Problem Relation Age of Onset  . Hypertension Mother   . Varicose Veins Mother   . Hypertension Brother    History  Substance Use Topics  . Smoking status: Current Some Day Smoker -- 40 years    Types: Cigars    Last Attempt to Quit: 12/20/2011  . Smokeless tobacco: Never Used  . Alcohol Use: Yes     Comment: liquior sometimes    Review of Systems  Ten systems reviewed and are negative for acute change, except as noted in the HPI.    Allergies  Aspirin and Penicillins  Home Medications   Prior to Admission medications   Medication Sig Start Date End Date Taking? Authorizing Provider  amLODipine (NORVASC) 10 MG tablet Take 10 mg by mouth daily at 12 noon.  03/23/14  Yes Historical Provider, MD  aspirin EC 81 MG tablet Take 81 mg by mouth every morning.    Yes Historical Provider, MD  clopidogrel (PLAVIX) 75 MG tablet Take 75 mg by mouth daily at 12 noon.  03/23/14  Yes Historical Provider, MD  glimepiride (AMARYL) 4 MG tablet Take 4 mg by mouth 2 (two) times daily.  03/23/14  Yes Historical Provider, MD  HUMALOG 100 UNIT/ML injection Inject 10 Units into the skin 3 (three) times daily with meals.  02/16/14  Yes Historical Provider, MD  indomethacin (INDOCIN) 50 MG capsule Take 50 mg by mouth 2 (two) times daily with a meal. Gout flare-up.   Yes Historical Provider, MD  insulin glargine (LANTUS) 100 UNIT/ML injection Inject 50 Units into the skin 2 (two) times daily.  06/06/12  Yes Bonnielee Haff, MD  lisinopril (PRINIVIL,ZESTRIL) 40 MG tablet Take 40 mg by mouth every morning.    Yes Historical Provider, MD  metFORMIN (GLUCOPHAGE-XR) 500 MG 24 hr tablet Take 1,000 mg by mouth 2 (two) times daily.  04/06/14  Yes Historical Provider, MD  Multiple Vitamins-Minerals (MULTIVITAMIN PO) Take 1 tablet by mouth every morning.    Yes Historical Provider, MD  Omega-3 Fatty Acids (FISH OIL) 1000 MG CAPS Take 2 capsules by mouth 2 (two) times daily.    Yes  Historical Provider, MD  simvastatin (ZOCOR) 20 MG tablet Take 20 mg by mouth every evening.   Yes Historical Provider, MD  sulfamethoxazole-trimethoprim (BACTRIM DS,SEPTRA DS) 800-160 MG per tablet Take 2 tablets by mouth 2 (two) times daily.  04/27/14  Yes Historical Provider, MD  chlorhexidine (HIBICLENS) 4 % external liquid Wash head to toe in shower once daily at night for 7 days, no deodorant creams until one hour after this shower Patient not taking: Reported on 05/04/2014 08/12/12   Truman Hayward, MD  doxycycline (VIBRAMYCIN) 100 MG capsule Take 1 capsule (100 mg total) by mouth 2 (two) times daily. One po bid x 7 days 05/04/14   Margarita Mail, PA-C  insulin aspart (NOVOLOG) 100 UNIT/ML injection CBG 70 - 120: 0 units CBG 121 - 150: 2 units CBG 151 - 200: 3 units CBG 201 - 250: 5 units CBG 251 - 300: 8 units  Call MD if  CBg is greater than 200 Patient not taking: Reported on 05/04/2014 06/05/12   Bonnielee Haff, MD  methocarbamol (ROBAXIN) 500 MG tablet Take 1 tablet (500 mg total) by mouth every 6 (six) hours as needed. Patient not taking: Reported on 05/04/2014 06/05/12   Amber Renelda Loma, PA-C  mupirocin ointment (BACTROBAN) 2 % Intranasally twice daily for 7 days with antibiotic wash Patient not taking: Reported on 05/04/2014 08/12/12   Truman Hayward, MD  oxyCODONE-acetaminophen (PERCOCET) 5-325 MG per tablet Take 1-2 tablets by mouth every 4 (four) hours as needed. 05/04/14   Margarita Mail, PA-C  polyethylene glycol (MIRALAX / GLYCOLAX) packet Take 17 g by mouth daily as needed. Patient not taking: Reported on 05/04/2014 06/05/12   Amber Renelda Loma, PA-C  saccharomyces boulardii (FLORASTOR) 250 MG capsule Take 1 capsule (250 mg total) by mouth 2 (two) times daily. Patient not taking: Reported on 05/04/2014 08/12/12   Truman Hayward, MD   BP 134/61 mmHg  Pulse 107  Temp(Src) 98.3 F (36.8 C)  Resp 18  SpO2 96% Physical Exam  Constitutional: He appears  well-developed and well-nourished. No distress.  HENT:  Head: Normocephalic and atraumatic.  Eyes: Conjunctivae are normal. No scleral icterus.  Neck: Normal range of motion. Neck supple.  Cardiovascular: Normal rate, regular rhythm and normal heart sounds.   Pulmonary/Chest: Effort normal and breath sounds normal. No respiratory distress.  Abdominal: Soft. There is no tenderness.  Musculoskeletal: He exhibits no edema.  Examination of the feet reveals DP absent bilateral, dry cracking heels, PT absent bilateral, tinea pedis and Macerated skin with desquamation between the 4th and 5th toe on the Left. Mild erythema without overt signs of gangrene. Feet are warm with cap refill < 3 seconds.   Neurological: He is alert.  Skin: Skin is warm and dry. He is not diaphoretic.  Psychiatric: His behavior is normal.  Nursing note and vitals reviewed.   ED Course  Procedures (including critical care time) Labs Review Labs Reviewed  BASIC METABOLIC PANEL - Abnormal; Notable for the following:    Sodium 134 (*)    Glucose, Bld 131 (*)    Creatinine, Ser 1.39 (*)    GFR calc non Af Amer 50 (*)    GFR calc Af Amer 58 (*)    All other components within normal limits  CBC WITH DIFFERENTIAL - Abnormal; Notable for the following:    HCT 38.9 (*)    All other components within normal limits    Imaging Review Dg Toe 5th Left  05/04/2014   CLINICAL DATA:  Pain and discharge at little toe, clinically suspected gangrene, area of infection between the fourth and fifth toes  EXAM: DG TOE 5TH LEFT  COMPARISON:  None  FINDINGS: Dressing artifacts at little toe.  Osseous demineralization.  Joint spaces preserved.  No acute fracture, dislocation or bone destruction.  No definite soft tissue gas identified.  IMPRESSION: Osseous demineralization without acute bony abnormality.   Electronically Signed   By: Lavonia Dana M.D.   On: 05/04/2014 16:51     EKG Interpretation None      MDM   Final diagnoses:   Toe pain, chronic, left  Tinea pedis of left foot  Mild dehydration    7:53 PM BP 134/61 mmHg  Pulse 107  Temp(Src) 98.3 F (36.8 C)  Resp 18  SpO2 96% Patient is here for evaluation of the left toe. On my examination, it does not appear to be consistent with gangrene,  it does appear to be advanced tinea. His x-ray shows no sign of bone loss, or subcutaneous air consistent with osteomyelitis. Labs are pending.  7:53 PM BP 134/61 mmHg  Pulse 107  Temp(Src) 98.3 F (36.8 C)  Resp 18  SpO2 96%  Patient labs show mild dehydration, hyperglycemia, no signs of osteomyelitis. Patient will be discharged today with doxycycline for coverage of any impending infection in the toe. Patient is to follow up with his primary care physician. He also has a podiatrist with whom he may follow up. Pain medication at discharge. Patient appears safe for discharge at this time. Discussed return precautions. Monophasic pusles heard on doppler.   Margarita Mail, PA-C 05/04/14 1953  Ephraim Hamburger, MD 05/04/14 [id number]

## 2014-05-04 NOTE — Telephone Encounter (Signed)
Phone call from the [facility].  Dr. Nils Pyle requested appt. ASAP for Wet Gangrene, bilateral lower extremities.  Discussed with Dr. Oneida Alar.  Stated he would need to see Dr. Kellie Simmering on Tuesday, 12/22, or if Dr. Nils Pyle felt pt. Needed to be seen emergently, he should go to the ER.  Called Tanya back @ the [facility]; Advised of recommendation by Dr. Oneida Alar.  Appt. Given for 12/22 @ 2:30 PM.  Stated she will discuss with Dr. Nils Pyle, and call office to cancel appt., if pt. is sent to the ER.

## 2014-05-04 NOTE — Discharge Instructions (Signed)
Diabetes and Foot Care Diabetes may cause you to have problems because of poor blood supply (circulation) to your feet and legs. This may cause the skin on your feet to become thinner, break easier, and heal more slowly. Your skin may become dry, and the skin may peel and crack. You may also have nerve damage in your legs and feet causing decreased feeling in them. You may not notice minor injuries to your feet that could lead to infections or more serious problems. Taking care of your feet is one of the most important things you can do for yourself.  HOME CARE INSTRUCTIONS  Wear shoes at all times, even in the house. Do not go barefoot. Bare feet are easily injured.  Check your feet daily for blisters, cuts, and redness. If you cannot see the bottom of your feet, use a mirror or ask someone for help.  Wash your feet with warm water (do not use hot water) and mild soap. Then pat your feet and the areas between your toes until they are completely dry. Do not soak your feet as this can dry your skin.  Apply a moisturizing lotion or petroleum jelly (that does not contain alcohol and is unscented) to the skin on your feet and to dry, brittle toenails. Do not apply lotion between your toes.  Trim your toenails straight across. Do not dig under them or around the cuticle. File the edges of your nails with an emery board or nail file.  Do not cut corns or calluses or try to remove them with medicine.  Wear clean socks or stockings every day. Make sure they are not too tight. Do not wear knee-high stockings since they may decrease blood flow to your legs.  Wear shoes that fit properly and have enough cushioning. To break in new shoes, wear them for just a few hours a day. This prevents you from injuring your feet. Always look in your shoes before you put them on to be sure there are no objects inside.  Do not cross your legs. This may decrease the blood flow to your feet.  If you find a minor scrape,  cut, or break in the skin on your feet, keep it and the skin around it clean and dry. These areas may be cleansed with mild soap and water. Do not cleanse the area with peroxide, alcohol, or iodine.  When you remove an adhesive bandage, be sure not to damage the skin around it.  If you have a wound, look at it several times a day to make sure it is healing.  Do not use heating pads or hot water bottles. They may burn your skin. If you have lost feeling in your feet or legs, you may not know it is happening until it is too late.  Make sure your health care provider performs a complete foot exam at least annually or more often if you have foot problems. Report any cuts, sores, or bruises to your health care provider immediately. SEEK MEDICAL CARE IF:   You have an injury that is not healing.  You have cuts or breaks in the skin.  You have an ingrown nail.  You notice redness on your legs or feet.  You feel burning or tingling in your legs or feet.  You have pain or cramps in your legs and feet.  Your legs or feet are numb.  Your feet always feel cold. SEEK IMMEDIATE MEDICAL CARE IF:   There is increasing redness,  swelling, or pain in or around a wound.  There is a red line that goes up your leg.  Pus is coming from a wound.  You develop a fever or as directed by your health care provider.  You notice a bad smell coming from an ulcer or wound. Document Released: 05/03/2000 Document Revised: 01/06/2013 Document Reviewed: 10/13/2012 [facility] Patient Information 2015 [place], [place]. This information is not intended to replace advice given to you by your health care provider. Make sure you discuss any questions you have with your health care provider.  Athlete's Foot  Athlete's foot is a skin infection caused by a fungus. Athlete's foot is often seen between or under the toes. It can also be seen on the bottom of the foot. Athlete's foot can spread to other people by sharing towels  or shower stalls. HOME CARE  Only take medicines as told by your doctor. Do not use steroid creams.  Wash your feet daily. Dry your feet well, especially between the toes.  Change your socks every day. Wear cotton or wool socks.  Change your socks 2 to 3 times a day in hot weather.  Wear sandals or canvas tennis shoes with good airflow.  If you have blisters, soak your feet in a solution as told by your doctor. Do this for 20 to 30 minutes, 2 times a day. Dry your feet well after you soak them.  Do not share towels.  Wear sandals when you use shared locker rooms or showers. GET HELP RIGHT AWAY IF:   You have a fever.  Your foot is puffy (swollen), sore, warm, or red.  You are not getting better after 7 days of treatment.  You still have athlete's foot after 30 days.  You have problems caused by your medicine. MAKE SURE YOU:   Understand these instructions.  Will watch your condition.  Will get help right away if you are not doing well or get worse. Document Released: 10/23/2007 Document Revised: 07/29/2011 Document Reviewed: 02/22/2011 [facility] Patient Information 2015 [place], [place]. This information is not intended to replace advice given to you by your health care provider. Make sure you discuss any questions you have with your health care provider.

## 2014-05-10 ENCOUNTER — Ambulatory Visit: Payer: Medicare Other | Admitting: Vascular Surgery

## 2014-05-10 ENCOUNTER — Encounter (HOSPITAL_COMMUNITY): Payer: Medicare Other

## 2014-05-19 DIAGNOSIS — M79675 Pain in left toe(s): Secondary | ICD-10-CM | POA: Diagnosis not present

## 2014-05-19 DIAGNOSIS — L89894 Pressure ulcer of other site, stage 4: Secondary | ICD-10-CM | POA: Diagnosis not present

## 2014-05-19 DIAGNOSIS — M79672 Pain in left foot: Secondary | ICD-10-CM | POA: Diagnosis not present

## 2014-05-26 DIAGNOSIS — L03032 Cellulitis of left toe: Secondary | ICD-10-CM | POA: Diagnosis not present

## 2014-05-26 DIAGNOSIS — M79675 Pain in left toe(s): Secondary | ICD-10-CM | POA: Diagnosis not present

## 2014-05-30 ENCOUNTER — Encounter: Payer: Self-pay | Admitting: Family

## 2014-05-31 ENCOUNTER — Ambulatory Visit (HOSPITAL_COMMUNITY)
Admission: RE | Admit: 2014-05-31 | Discharge: 2014-05-31 | Disposition: A | Discharge status: Home / Self Care | Payer: Medicare Other | Source: Ambulatory Visit | Attending: Family | Admitting: Family

## 2014-05-31 ENCOUNTER — Encounter: Payer: Self-pay | Admitting: Family

## 2014-05-31 ENCOUNTER — Ambulatory Visit (INDEPENDENT_AMBULATORY_CARE_PROVIDER_SITE_OTHER): Payer: Medicare Other | Admitting: Family

## 2014-05-31 ENCOUNTER — Telehealth: Payer: Self-pay | Admitting: Internal Medicine

## 2014-05-31 ENCOUNTER — Ambulatory Visit: Payer: Medicare Other | Admitting: Gastroenterology

## 2014-05-31 ENCOUNTER — Encounter: Payer: Self-pay | Admitting: Gastroenterology

## 2014-05-31 VITALS — BP 152/81 | HR 102 | Temp 97.6°F | Resp 18 | Ht 69.0 in | Wt 210.7 lb

## 2014-05-31 DIAGNOSIS — E1159 Type 2 diabetes mellitus with other circulatory complications: Secondary | ICD-10-CM | POA: Diagnosis not present

## 2014-05-31 DIAGNOSIS — I999 Unspecified disorder of circulatory system: Secondary | ICD-10-CM

## 2014-05-31 DIAGNOSIS — L97523 Non-pressure chronic ulcer of other part of left foot with necrosis of muscle: Secondary | ICD-10-CM

## 2014-05-31 DIAGNOSIS — M79672 Pain in left foot: Secondary | ICD-10-CM

## 2014-05-31 DIAGNOSIS — I739 Peripheral vascular disease, unspecified: Secondary | ICD-10-CM

## 2014-05-31 DIAGNOSIS — Z720 Tobacco use: Secondary | ICD-10-CM

## 2014-05-31 DIAGNOSIS — F17200 Nicotine dependence, unspecified, uncomplicated: Secondary | ICD-10-CM

## 2014-05-31 DIAGNOSIS — E1151 Type 2 diabetes mellitus with diabetic peripheral angiopathy without gangrene: Secondary | ICD-10-CM

## 2014-05-31 MED ORDER — OXYCODONE HCL 5 MG PO TABS: 5.0000 mg | ORAL_TABLET | Freq: Three times a day (TID) | ORAL | Status: DC | PRN

## 2014-05-31 NOTE — Telephone Encounter (Signed)
Two no shows since 03/2014.

## 2014-05-31 NOTE — Progress Notes (Addendum)
[facility] [place] HISTORY AND PHYSICAL -PAD  History of Present Illness Logan [name] is a 69 y.o. male  patient of Dr. Kellie Simmering had an aortobifemoral bypass graft with occlusion of the right limb. He also has a left-to-right femoral-femoral bypass graft on 12/20/11. He had a history of a draining sinus from the left inguinal area. This closed without any evidence of residual infection. Patient developed a scrotal abscess apparently at his 2013 visit and had a small draining sinus lateral to the right inguinal incision, this has healed. The patient returns today referred by Dr. Blanch Media, podiatrist, for worsening left foot ulcer,   He has claudication in both calves with standing and walking only about 20 feet, relieved by rest; this severe claudication started when he ran out of his short acting insulin in May or June 2015 and his his serum glucose increased significantly.  Pt denies any history of stroke or TIA. He has cardiac stents, did not experience any symptoms of an MI, but was told that he had an MI.  Pt Diabetic: Yes, states his home glucose readings are around 150 Pt smoker: smoker (1 cigar every 2-4 days, stopped cigarettes in 2012)  Pt meds include: Statin :Yes ASA: Yes Other anticoagulants/antiplatelets: he states that he may be taking clopidogrel, prescribed by Dr. Brigitte Pulse for his PAD, per pt   Dr. Evelena Leyden progress note from 02/22/14:  Patient with occlusion of aortobifemoral graft with history of draining sinus and left inguinal area. No active infection at the present time. No limb threatening ischemia at the present time. Patient is tolerating occlusion of the graft. Will not plan any inflow procedure as long as patient does not have limb threatening ischemia because of high likelihood of infection. He would need axillofemoral graft and high likelihood that left inguinal area would become infected since there has been infection there in the past. Discusses with  patient and he is in agreement with that. He will return in 6 months with repeat ABIs unless symptoms worsen.    Past Medical History  Diagnosis Date  . Overweight(278.02)   . Dyslipidemia   . Cardiomyopathy   . Diabetes mellitus   . PVD (peripheral vascular disease)     right to left fem -fem bypass 1997/ aortabifemoral bypass 2005 right to left fem-fem 07/2004. Secondary to occluded righ tlimb of aortobifemoral bypass  . Tobacco abuse   . Anginal pain   . Hypertension   . Shortness of breath   . Coronary artery disease     taxis DES stent circumflex 02/2003.... residual total distal circumflex and 50% LAD/ cardiolite 08/2006 inferior scar no ischemia  . Degenerative disc disease, lumbar   . Coronary artery disease   . Myocardial infarction     Social History History  Substance Use Topics  . Smoking status: Current Some Day Smoker -- 40 years    Types: Cigars    Last Attempt to Quit: 12/20/2011  . Smokeless tobacco: Never Used  . Alcohol Use: Yes     Comment: liquior sometimes    Family History Family History  Problem Relation Age of Onset  . Hypertension Mother   . Varicose Veins Mother   . Hypertension Brother     Past Surgical History  Procedure Laterality Date  . Knee surgery    . Axillary-femoral bypass graft  12/20/2011    Procedure: BYPASS GRAFT AXILLA-BIFEMORAL;  Surgeon: Conrad Noblestown, MD;  Location: [place];  Service: Vascular;  Laterality: Bilateral;  Left  aorta bilateral femoral thrombectomy, femoral to femoral thrombectomy, and revision of Femoral to femoral graft. and Thrombectomy of Left tibial artery.  . Fasciotomy  12/20/2011    Procedure: FASCIOTOMY;  Surgeon: Conrad Corn, MD;  Location: [place];  Service: Vascular;  Laterality: Right;  right calf facsicotomy.  . Lower extremity angiogram  12/20/2011    Procedure: LOWER EXTREMITY ANGIOGRAM;  Surgeon: Conrad [place], MD;  Location: [place];  Service: Vascular;  Laterality: Bilateral;  Intraoperative Abdominal  Aortogram with bilateral runoff  . Left-to-right femoral-femoral bypass graft  07-30-2004  DR [facility]    OCCLUSION RIGHT LIMB AORTOBIFEMORAL BYPASS GRAFT  W/ SEVERE CLAUDICATION  . Aorto to right common femoral and left profunda femoris bypass graft  09-28-2003 DR LAWSON    SEVERE AORTOILIAC OCCLUSIVE DISEASE W/ OCCLUDED FEM-FEM BYPASS OF BOTH LOWER EXTREMITIES  . Right lateral parotidectomy with facial verve preservation  02-25-2001  DR [facility]    NEOPLASM RIGHT PAROTID GLAND  . Coronary angioplasty with stent placement  02-23-2003  DR [facility]    PCI W/ STENTING PROXIMAL CIRCUMFLEX  . Femoral-femoral bypass graft  1997  DR [facility]    RIGHT TO LEFT  . Debridement leg  12/25/11  . Incision and drainage of wound  02/17/2012    Procedure: IRRIGATION AND DEBRIDEMENT WOUND;  Surgeon: Theodoro Kos, DO;  Location: [place];  Service: Plastics;  Laterality: Bilateral;  . Lumbar laminectomy/decompression microdiscectomy  06/02/2012    Procedure: LUMBAR LAMINECTOMY/DECOMPRESSION MICRODISCECTOMY 1 LEVEL;  Surgeon: Tobi Bastos, MD;  Location: [facility];  Service: Orthopedics;  Laterality: N/A;  Central Decompression Lumbar Laminectomy L5-S1   . Spine surgery    . Ilecolonscopy  05/05/07    RMR:ana; papilla otherwise normal rectum pancolonic diverticula    Allergies  Allergen Reactions  . Aspirin Hives  . Penicillins Rash and Other (See Comments)    Immune system shut down     Current Outpatient Prescriptions  Medication Sig Dispense Refill  . amLODipine (NORVASC) 10 MG tablet Take 10 mg by mouth daily at 12 noon.     Marland Kitchen aspirin EC 81 MG tablet Take 81 mg by mouth every morning.     . chlorhexidine (HIBICLENS) 4 % external liquid Wash head to toe in shower once daily at night for 7 days, no deodorant creams until one hour after this shower 237 mL 3  . clopidogrel (PLAVIX) 75 MG tablet Take 75 mg by mouth daily at 12 noon.     Marland Kitchen doxycycline (VIBRAMYCIN) 100 MG capsule Take 1 capsule  (100 mg total) by mouth 2 (two) times daily. One po bid x 7 days 14 capsule 0  . glimepiride (AMARYL) 4 MG tablet Take 4 mg by mouth 2 (two) times daily.     Marland Kitchen HUMALOG 100 UNIT/ML injection Inject 10 Units into the skin 3 (three) times daily with meals.   0  . indomethacin (INDOCIN) 50 MG capsule Take 50 mg by mouth 2 (two) times daily with a meal. Gout flare-up.    Marland Kitchen insulin aspart (NOVOLOG) 100 UNIT/ML injection CBG 70 - 120: 0 units CBG 121 - 150: 2 units CBG 151 - 200: 3 units CBG 201 - 250: 5 units CBG 251 - 300: 8 units  Call MD if CBg is greater than 200 1 vial 0  . insulin glargine (LANTUS) 100 UNIT/ML injection Inject 50 Units into the skin 2 (two) times daily.     Marland Kitchen lisinopril (PRINIVIL,ZESTRIL) 40 MG tablet Take 40  mg by mouth every morning.     . metFORMIN (GLUCOPHAGE-XR) 500 MG 24 hr tablet Take 1,000 mg by mouth 2 (two) times daily.     . methocarbamol (ROBAXIN) 500 MG tablet Take 1 tablet (500 mg total) by mouth every 6 (six) hours as needed. 40 tablet 1  . Multiple Vitamins-Minerals (MULTIVITAMIN PO) Take 1 tablet by mouth every morning.     . mupirocin ointment (BACTROBAN) 2 % Intranasally twice daily for 7 days with antibiotic wash 30 g 2  . Omega-3 Fatty Acids (FISH OIL) 1000 MG CAPS Take 2 capsules by mouth 2 (two) times daily.     Marland Kitchen oxyCODONE-acetaminophen (PERCOCET) 5-325 MG per tablet Take 1-2 tablets by mouth every 4 (four) hours as needed. 20 tablet 0  . polyethylene glycol (MIRALAX / GLYCOLAX) packet Take 17 g by mouth daily as needed. 14 each 0  . saccharomyces boulardii (FLORASTOR) 250 MG capsule Take 1 capsule (250 mg total) by mouth 2 (two) times daily. 1 capsule 30  . simvastatin (ZOCOR) 20 MG tablet Take 20 mg by mouth every evening.    . sulfamethoxazole-trimethoprim (BACTRIM DS,SEPTRA DS) 800-160 MG per tablet Take 2 tablets by mouth 2 (two) times daily.      No current facility-administered medications for this visit.    ROS: See HPI for pertinent  positives and negatives.   Physical Examination  Filed Vitals:   05/31/14 1340  BP: 152/81  Pulse: 102  Temp: 97.6 F (36.4 C)  TempSrc: Oral  Resp: 18  Height: 5\' 9"  (1.753 m)  Weight: 210 lb 11.2 oz (95.573 kg)  SpO2: 98%   Body mass index is 31.1 kg/(m^2).  General: A&O x 3, WDWN, obese male. Gait: normal Eyes: Pupils are equal Pulmonary:Respirations are non labored. Cardiac: regular Rythm , without detected murmur.     Carotid Bruits Right Left   Negative Negative  Aorta is not palpable. Radial pulses: absent right radial,  palpable left radial, palpable right brachial pulse.   VASCULAR EXAM: Extremities with ischemic changes: proximal portion of toes of both feet are dusky, cool, without Gangrene; with open wounds: deep semi moist ulcer medial aspect left 5th toe, no drainage. Eschar debrided from right tibial area, dermis layer ulcer revealed with white tissue.     LE Pulses Right Left   FEMORAL not palpable not palpable    POPLITEAL not palpable  not palpable   POSTERIOR TIBIAL not palpable not palpable    DORSALIS PEDIS  ANTERIOR TIBIAL not palpable not palpable   PERONEAL  not Palpable    not Palpable    Abdomen: soft, NT, no masses palpated. Skin: no rashes, see extremities. Musculoskeletal: no muscle wasting or atrophy. Neurologic: A&O X 3; Appropriate Affect, MOTOR FUNCTION: moving all extremities equally, motor strength 4/5 throughout. Speech is fluent/normal. CN 2-12 grossly intact.   Previous angiogram: 02/22/14 CTA abd/pelvis:  IMPRESSION: An aorto to left femoral artery bypass graft is occluded. Native aorta and iliac arteries are occluded.  Femoral to femoral bypass grafts are occluded.  Right common  femoral artery reconstitutes. Right popliteal artery is occluded. One vessel runoff via the posterior tibial artery.  Left common femoral and superficial femoral arteries are occluded. Left popliteal artery reconstitutes with 1 vessel runoff to the left ankle, the peroneal artery. Thrombus extends from the common femoral artery into the dominant profunda femoral artery branch.   ASSESSMENT: Jaramie Bastos is a 69 y.o. male who presents s/p aortobifemoral bypass graft with occlusion of the  right limb. He also has a left-to-right femoral-femoral bypass graft on 12/20/11.  He has worsening ulcer on medial aspect left 5th toe with severe pain confined to the left 5th toe. Dr. Kellie Simmering debrided right tibial area eschar. Dr. Kellie Simmering viewed images from pt's CTA abdomen/pelvis and discussed limited options for revascularization with patient and wife. Pt and wife could not decide to have the major revascularization surgery offered by Dr. Kellie Simmering to be done in 2 days.  In the meantime he will continue with wound care with the likelihood that left 5th toe ulcer will not be able to heal, possible proximal progression of left and possibly right LE critical ischemia. ABI's on 02/11/14 revealed 0.40 right ABI and 0.44 left ABI with absent bilateral dorsalis pedis waveforms, monophasic bilateral posterior tibial waveforms, and absent bilateral great toe pressures.  02/22/14 CTA abd/pelvis revealed occluded aorto to left femoral artery bypass graft, occluded native aorta and iliac arteries. Femoral to femoral bypass grafts are occluded. Right common femoral artery reconstitutes. Right popliteal artery is occluded. One vessel runoff via the posterior tibial artery. Left common femoral and superficial femoral arteries are occluded. Left popliteal artery reconstitutes with 1 vessel runoff to the left ankle, the peroneal artery. Thrombus extends from the common femoral artery into the dominant profunda femoral artery  branch.  Return to wound care center for left 5th toe ulcer and right tibial area wound care.  Return in 4 weeks to discuss options with Dr. Kellie Simmering. Face to face time with patient was: 30 minutes by myself and 15 minutes by Dr. Kellie Simmering. Over 50% of this time was spent on counseling and coordination of care.    PLAN:  I discussed in depth with the patient the nature of atherosclerosis, and emphasized the importance of maximal medical management including strict control of blood pressure, blood glucose, and lipid levels, obtaining regular exercise, and Refer back tocessation of smoking.  The patient is aware that without maximal medical management the underlying atherosclerotic disease process will progress, limiting the benefit of any interventions. Refer back to Wound care center for daily wound care of right tibial area wound and left 5th toe ulcer. Patient requested prescription for analgesic to address severe pain of ischemia. Oxycodone 5 mg, 1 tablet every 8 hours prn pain, disp #30, 0 refills.  Based on the patient's vascular studies and examination, pt will return to clinic in 4 weeks with ABI's, Dr. Kellie Simmering; he knows to return sooner if needed, and to let us know if he wants to proceed with the revascularization surgery offered by Dr. Kellie Simmering.  The patient was given information about PAD including signs, symptoms, treatment, what symptoms should prompt the patient to seek immediate medical care, and risk reduction measures to take.  Logan Chambers, RN, MSN, FNP-C [facility] Phone: [phone]  Clinic MD: Kellie Simmering  05/31/2014 1:46 PM   Agree with above assessment Patient has ulceration medial aspect of left fifth toe. Absent femoral and distal pulses bilaterally CTA done in October revealed occlusion of aortobifemoral graft with only patent vessel and left inguinal area being the profunda Patient has had lower extremity bypass grafts in both  legs 1 performed by Dr. Onnie Boer many years ago on the left side.  I think only possible way that left leg would be salvage would be to an attempt left asked low to profunda bypass see if this would be satisfactory to heal fourth and fifth toe amputation if this becomes necessary which I  thank it well. I think patient will likely come to above-knee dictation without revascularization attempt. Multiple problems exist. He has had previous infection in the left inguinal area with draining sinus for several months. Left axillofemoral graft may become infected and have to be removed. Left asked low profunda bypass may not remain patent because of poor runoff. Patient may not heal foot even with patent left axillofemoral graft.  I discussed all of these problems with patient and his wife and did recommend proceeding with surgery this week which he refused. If he changes his mind he will be in touch with [place] and I will see him otherwise in 4 weeks.

## 2014-05-31 NOTE — Patient Instructions (Signed)

## 2014-05-31 NOTE — Telephone Encounter (Signed)
PATIENT WAS A NO SHOW 05/31/14 AND LETTER WAS SENT

## 2014-06-02 ENCOUNTER — Other Ambulatory Visit: Payer: Self-pay

## 2014-06-02 DIAGNOSIS — E11621 Type 2 diabetes mellitus with foot ulcer: Secondary | ICD-10-CM | POA: Diagnosis not present

## 2014-06-02 DIAGNOSIS — I70222 Atherosclerosis of native arteries of extremities with rest pain, left leg: Secondary | ICD-10-CM | POA: Diagnosis not present

## 2014-06-02 DIAGNOSIS — L97529 Non-pressure chronic ulcer of other part of left foot with unspecified severity: Secondary | ICD-10-CM | POA: Diagnosis not present

## 2014-06-02 DIAGNOSIS — I70229 Atherosclerosis of native arteries of extremities with rest pain, unspecified extremity: Secondary | ICD-10-CM | POA: Diagnosis not present

## 2014-06-09 NOTE — Pre-Procedure Instructions (Signed)
Logan [name]  06/09/2014   Your procedure is scheduled on:  Friday, January 29.  Report to [facility] Admitting at 5:30AM.  Call this number if you have problems the morning of surgery: [phone]   Remember:   Do not eat food or drink liquids after midnight Thursday, January 28.   Take these medicines the morning of surgery with A SIP OF WATER: amLODipine (NORVASC).               Take pain medication and  methocarbamol (ROBAXIN)  if needed.              Stop taking Vitamins, indomethacin (INDOCIN); do not take any Ibuprofen (Advil) or Naproxen (Aleve).                 Stop clopidogrel (Plavix)per Dr Evelena Leyden instructions.                        Do not take medication for Diabetes the morning of surgery.   Do not wear jewelry, make-up or nail polish.  Do not wear lotions, powders, or perfumes.   Men may shave face and neck.  Do not bring valuables to the hospital.               [facility] is not responsible for any belongings or valuables.               Contacts, dentures or bridgework may not be worn into surgery.  Leave suitcase in the car. After surgery it may be brought to your room.  For patients admitted to the hospital, discharge time is determined by your treatment team.               Patients discharged the day of surgery will not be allowed to drive home.  Name and phone number of your driver: -   Special Instructions:  -   Please read over the following fact sheets that you were given: Pain Booklet, Coughing and Deep Breathing, Blood Transfusion Information and Surgical Site Infection Prevention

## 2014-06-10 ENCOUNTER — Other Ambulatory Visit: Payer: Self-pay

## 2014-06-10 ENCOUNTER — Encounter (HOSPITAL_COMMUNITY)
Admission: RE | Admit: 2014-06-10 | Discharge: 2014-06-10 | Disposition: A | Discharge status: Home / Self Care | Payer: Medicare Other | Source: Ambulatory Visit | Attending: Vascular Surgery | Admitting: Vascular Surgery

## 2014-06-10 ENCOUNTER — Encounter (HOSPITAL_COMMUNITY): Payer: Self-pay

## 2014-06-10 DIAGNOSIS — I70244 Atherosclerosis of native arteries of left leg with ulceration of heel and midfoot: Secondary | ICD-10-CM | POA: Diagnosis not present

## 2014-06-10 DIAGNOSIS — Z01818 Encounter for other preprocedural examination: Secondary | ICD-10-CM | POA: Diagnosis not present

## 2014-06-10 HISTORY — DX: Personal history of other medical treatment: Z92.89

## 2014-06-10 HISTORY — DX: Unspecified cataract: H26.9

## 2014-06-10 LAB — COMPREHENSIVE METABOLIC PANEL
ALT: 17 U/L (ref 0–53)
AST: 23 U/L (ref 0–37)
Albumin: 3.3 g/dL — ABNORMAL LOW (ref 3.5–5.2)
Alkaline Phosphatase: 87 U/L (ref 39–117)
Anion gap: 11 (ref 5–15)
BUN: 19 mg/dL (ref 6–23)
CALCIUM: 9.5 mg/dL (ref 8.4–10.5)
CHLORIDE: 107 mmol/L (ref 96–112)
CO2: 19 mmol/L (ref 19–32)
CREATININE: 1.23 mg/dL (ref 0.50–1.35)
GFR calc Af Amer: 67 mL/min — ABNORMAL LOW (ref >90)
GFR, EST NON AFRICAN AMERICAN: 58 mL/min — AB (ref >90)
Glucose, Bld: 207 mg/dL — ABNORMAL HIGH (ref 70–99)
Potassium: 4.5 mmol/L (ref 3.5–5.1)
Sodium: 137 mmol/L (ref 135–145)
Total Bilirubin: 0.3 mg/dL (ref 0.3–1.2)
Total Protein: 7.1 g/dL (ref 6.0–8.3)

## 2014-06-10 LAB — URINALYSIS, ROUTINE W REFLEX MICROSCOPIC
Bilirubin Urine: NEGATIVE
GLUCOSE, UA: 500 mg/dL — AB
Hgb urine dipstick: NEGATIVE
KETONES UR: NEGATIVE mg/dL
Leukocytes, UA: NEGATIVE
Nitrite: NEGATIVE
PROTEIN: NEGATIVE mg/dL
Specific Gravity, Urine: 1.023 (ref 1.005–1.030)
Urobilinogen, UA: 0.2 mg/dL (ref 0.0–1.0)
pH: 5 (ref 5.0–8.0)

## 2014-06-10 LAB — PROTIME-INR
INR: 1 (ref 0.00–1.49)
Prothrombin Time: 13.3 seconds (ref 11.6–15.2)

## 2014-06-10 LAB — APTT: aPTT: 30 seconds (ref 24–37)

## 2014-06-10 LAB — CBC
HCT: 36.3 % — ABNORMAL LOW (ref 39.0–52.0)
Hemoglobin: 12.2 g/dL — ABNORMAL LOW (ref 13.0–17.0)
MCH: 29.9 pg (ref 26.0–34.0)
MCHC: 33.6 g/dL (ref 30.0–36.0)
MCV: 89 fL (ref 78.0–100.0)
PLATELETS: 235 10*3/uL (ref 150–400)
RBC: 4.08 MIL/uL — ABNORMAL LOW (ref 4.22–5.81)
RDW: 14.1 % (ref 11.5–15.5)
WBC: 11.1 10*3/uL — AB (ref 4.0–10.5)

## 2014-06-10 LAB — TYPE AND SCREEN
ABO/RH(D): A POS
Antibody Screen: NEGATIVE

## 2014-06-10 LAB — SURGICAL PCR SCREEN
MRSA, PCR: NEGATIVE
Staphylococcus aureus: NEGATIVE

## 2014-06-10 NOTE — Progress Notes (Signed)
   06/10/14 1027  [place]  Have you ever been diagnosed with sleep apnea through a sleep study? No  Do you snore loudly (loud enough to be heard through closed doors)?  1  Do you often feel tired, fatigued, or sleepy during the daytime? 0  Has anyone observed you stop breathing during your sleep? 0  Do you have, or are you being treated for high blood pressure? 1  BMI more than 35 kg/m2? 0  Age over 71 years old? 1  Neck circumference greater than 40 cm/16 inches? 1  Gender: 1  Obstructive Sleep Apnea Score 5  Score 4 or greater  Results sent to PCP

## 2014-06-10 NOTE — Progress Notes (Signed)
Pt has a cardiac history with stents placed in 2004. Pt states he has not seen a cardiologist in several years, states his PCP, Dr. Manuella Ghazi follows him. Pt denies any chest pain or sob.

## 2014-06-10 NOTE — Pre-Procedure Instructions (Signed)
Logan [name]  06/10/2014   Your procedure is scheduled on:  Friday, June 17, 2014 at 7:30 AM.   Report to [facility] Entrance "A" Admitting Office at 5:30AM.   Call this number if you have problems the morning of surgery: [phone]               Any questions prior to day of surgery, please call [phone] between 8 & 4 PM>   Remember:   Do not eat food or drink liquids after midnight Thursday, 06/16/14.   Take these medicines the morning of surgery with A SIP OF WATER: amLODipine (NORVASC), Aspirin, Robaxin (Methocarbamol) - if needed, Oxycodone - if needed                       Stop taking Vitamins, Fish Oil, Indomethacin (INDOCIN); do not take any Ibuprofen (Advil) or Naproxen (Aleve).                 Stop Clopidogrel (Plavix)per Dr Evelena Leyden instructions.                        Do not take medication for Diabetes the morning of surgery.   Do not wear jewelry.  Do not wear lotions, powders, or cologne.   Men may shave face and neck.  Do not bring valuables to the hospital.               [facility] is not responsible for any belongings or valuables.               Contacts, dentures or bridgework may not be worn into surgery.  Leave suitcase in the car. After surgery it may be brought to your room.  For patients admitted to the hospital, discharge time is determined by your treatment team.                                     Special Instructions:  [facility] - Preparing for Surgery  Before surgery, you can play an important role.  Because skin is not sterile, your skin needs to be as free of germs as possible.  You can reduce the number of germs on you skin by washing with CHG (chlorahexidine gluconate) soap before surgery.  CHG is an antiseptic cleaner which kills germs and bonds with the skin to continue killing germs even after washing.  Please DO NOT use if you have an allergy to CHG or antibacterial soaps.  If your skin becomes reddened/irritated stop using  the CHG and inform your nurse when you arrive at Short Stay.  Do not shave (including legs and underarms) for at least 48 hours prior to the first CHG shower.  You may shave your face.  Please follow these instructions carefully:   1.  Shower with CHG Soap the night before surgery and the                                morning of Surgery.  2.  If you choose to wash your hair, wash your hair first as usual with your       normal shampoo.  3.  After you shampoo, rinse your hair and body thoroughly to remove the  Shampoo.  4.  Use CHG as you would any other liquid soap.  You can apply chg directly       to the skin and wash gently with scrungie or a clean washcloth.  5.  Apply the CHG Soap to your body ONLY FROM THE NECK DOWN.        Do not use on open wounds or open sores.  Avoid contact with your eyes, ears, mouth and genitals (private parts).  Wash genitals (private parts) with your normal soap.  6.  Wash thoroughly, paying special attention to the area where your surgery        will be performed.  7.  Thoroughly rinse your body with warm water from the neck down.  8.  DO NOT shower/wash with your normal soap after using and rinsing off       the CHG Soap.  9.  Pat yourself dry with a clean towel.            10.  Wear clean pajamas.            11.  Place clean sheets on your bed the night of your first shower and do not        sleep with pets.  Day of Surgery  Do not apply any lotions/deodorants the morning of surgery.  Please wear clean clothes to the hospital.     Please read over the following fact sheets that you were given: Pain Booklet, Coughing and Deep Breathing, Blood Transfusion Information, MRSA Information and Surgical Site Infection Prevention

## 2014-06-10 NOTE — Pre-Procedure Instructions (Signed)
Logan [name]  06/10/2014   Your procedure is scheduled on:  Friday, June 17, 2014 at 7:30 AM.   Report to [facility] Entrance "A" Admitting Office at 5:30AM.   Call this number if you have problems the morning of surgery: [phone]               Any questions prior to day of surgery, please call [phone] between 8 & 4 PM>   Remember:   Do not eat food or drink liquids after midnight Thursday, 06/16/14.   Take these medicines the morning of surgery with A SIP OF WATER: amLODipine (NORVASC), Aspirin, Robaxin (Methocarbamol) - if needed, Oxycodone - if needed                       Stop taking Vitamins, Fish Oil, Indomethacin (INDOCIN); do not take any Ibuprofen (Advil) or Naproxen (Aleve).                 Stop Clopidogrel (Plavix)per Dr Evelena Leyden instructions.                        Do not take medication for Diabetes the morning of surgery.   Do not wear jewelry.  Do not wear lotions, powders, or cologne.   Men may shave face and neck.  Do not bring valuables to the hospital.               [facility] is not responsible for any belongings or valuables.               Contacts, dentures or bridgework may not be worn into surgery.  Leave suitcase in the car. After surgery it may be brought to your room.  For patients admitted to the hospital, discharge time is determined by your treatment team.                                     Special Instructions:  [place] - Preparing for Surgery  Before surgery, you can play an important role.  Because skin is not sterile, your skin needs to be as free of germs as possible.  You can reduce the number of germs on you skin by washing with CHG (chlorahexidine gluconate) soap before surgery.  CHG is an antiseptic cleaner which kills germs and bonds with the skin to continue killing germs even after washing.  Please DO NOT use if you have an allergy to CHG or antibacterial soaps.  If your skin becomes reddened/irritated stop using  the CHG and inform your nurse when you arrive at Short Stay.  Do not shave (including legs and underarms) for at least 48 hours prior to the first CHG shower.  You may shave your face.  Please follow these instructions carefully:   1.  Shower with CHG Soap the night before surgery and the                                morning of Surgery.  2.  If you choose to wash your hair, wash your hair first as usual with your normal shampoo.  3.  After you shampoo, rinse your hair and body thoroughly to remove the  Shampoo.  4.  Use CHG as you would any other liquid soap.  You can apply chg directly to the skin and wash gently with scrungie or a clean washcloth.  5.  Apply the CHG Soap to your body ONLY FROM THE NECK DOWN.        Do not use on open wounds or open sores.  Avoid contact with your eyes, ears, mouth and genitals (private parts).  Wash genitals (private parts) with your normal soap.  6.  Wash thoroughly, paying special attention to the area where your surgery will be performed.  7.  Thoroughly rinse your body with warm water from the neck down.  8.  DO NOT shower/wash with your normal soap after using and rinsing off  the CHG Soap.  9.  Pat yourself dry with a clean towel.            10.  Wear clean pajamas.            11.  Place clean sheets on your bed the night of your first shower and do not  sleep with pets.  Day of Surgery  Do not apply any lotions/deodorants the morning of surgery.  Please wear clean clothes to the hospital.     Please read over the following fact sheets that you were given: Pain Booklet, Coughing and Deep Breathing, Blood Transfusion Information, MRSA Information and Surgical Site Infection Prevention

## 2014-06-13 NOTE — Progress Notes (Signed)
Anesthesia Chart Review:  Pt is 69 year old male scheduled for L axilla-femoral bypass graft on 06/17/2014 with Dr. Kellie Simmering.   PMH includes: CAD (stenting mid circumflex 2004), MI, PVD, HTN, cardiomyopathy, DM. BMI 32. Current smoker. S/p axilla-bifemoral bypass graft 2013.   Medications include plavix, ASA.   Preoperative labs reviewed.  Glucose 207.   EKG: sinus tachycardia (108 bpm).   Reviewed case with Dr. Tamala Julian.  If no changes, I anticipate pt can proceed with surgery as scheduled.   Willeen Cass, FNP-BC [facility]/Anesthesiology Phone: [phone] 06/13/2014 2:39 PM

## 2014-06-16 MED ORDER — VANCOMYCIN HCL IN DEXTROSE 1-5 GM/200ML-% IV SOLN
1000.0000 mg | INTRAVENOUS | Status: AC
  Administered 2014-06-17: 1000 mg via INTRAVENOUS
  Filled 2014-06-16: qty 200

## 2014-06-17 ENCOUNTER — Inpatient Hospital Stay (HOSPITAL_COMMUNITY)
Admission: RE | Admit: 2014-06-17 | Discharge: 2014-06-21 | DRG: 253 | Disposition: A | Discharge status: Skilled Nursing Facility | Payer: Medicare Other | Source: Ambulatory Visit | Attending: Vascular Surgery | Admitting: Vascular Surgery

## 2014-06-17 ENCOUNTER — Encounter (HOSPITAL_COMMUNITY): Payer: Self-pay | Admitting: Surgery

## 2014-06-17 ENCOUNTER — Encounter (HOSPITAL_COMMUNITY)
Admission: RE | Disposition: A | Discharge status: Skilled Nursing Facility | Payer: Self-pay | Source: Ambulatory Visit | Attending: Vascular Surgery

## 2014-06-17 ENCOUNTER — Inpatient Hospital Stay (HOSPITAL_COMMUNITY): Payer: Medicare Other | Admitting: Anesthesiology

## 2014-06-17 ENCOUNTER — Inpatient Hospital Stay (HOSPITAL_COMMUNITY): Payer: Medicare Other | Admitting: Vascular Surgery

## 2014-06-17 DIAGNOSIS — T82858A Stenosis of vascular prosthetic devices, implants and grafts, initial encounter: Principal | ICD-10-CM | POA: Diagnosis present

## 2014-06-17 DIAGNOSIS — Z6831 Body mass index (BMI) 31.0-31.9, adult: Secondary | ICD-10-CM | POA: Diagnosis not present

## 2014-06-17 DIAGNOSIS — I2510 Atherosclerotic heart disease of native coronary artery without angina pectoris: Secondary | ICD-10-CM | POA: Diagnosis present

## 2014-06-17 DIAGNOSIS — Z4889 Encounter for other specified surgical aftercare: Secondary | ICD-10-CM | POA: Diagnosis not present

## 2014-06-17 DIAGNOSIS — L97529 Non-pressure chronic ulcer of other part of left foot with unspecified severity: Secondary | ICD-10-CM | POA: Diagnosis present

## 2014-06-17 DIAGNOSIS — I96 Gangrene, not elsewhere classified: Secondary | ICD-10-CM | POA: Diagnosis not present

## 2014-06-17 DIAGNOSIS — F17210 Nicotine dependence, cigarettes, uncomplicated: Secondary | ICD-10-CM | POA: Diagnosis present

## 2014-06-17 DIAGNOSIS — E785 Hyperlipidemia, unspecified: Secondary | ICD-10-CM | POA: Diagnosis present

## 2014-06-17 DIAGNOSIS — E119 Type 2 diabetes mellitus without complications: Secondary | ICD-10-CM | POA: Diagnosis present

## 2014-06-17 DIAGNOSIS — I70244 Atherosclerosis of native arteries of left leg with ulceration of heel and midfoot: Secondary | ICD-10-CM

## 2014-06-17 DIAGNOSIS — I739 Peripheral vascular disease, unspecified: Secondary | ICD-10-CM | POA: Diagnosis present

## 2014-06-17 DIAGNOSIS — S81809A Unspecified open wound, unspecified lower leg, initial encounter: Secondary | ICD-10-CM | POA: Diagnosis present

## 2014-06-17 DIAGNOSIS — M6281 Muscle weakness (generalized): Secondary | ICD-10-CM | POA: Diagnosis not present

## 2014-06-17 DIAGNOSIS — L089 Local infection of the skin and subcutaneous tissue, unspecified: Secondary | ICD-10-CM | POA: Diagnosis not present

## 2014-06-17 DIAGNOSIS — L03116 Cellulitis of left lower limb: Secondary | ICD-10-CM | POA: Diagnosis not present

## 2014-06-17 DIAGNOSIS — L97819 Non-pressure chronic ulcer of other part of right lower leg with unspecified severity: Secondary | ICD-10-CM | POA: Diagnosis not present

## 2014-06-17 DIAGNOSIS — I429 Cardiomyopathy, unspecified: Secondary | ICD-10-CM | POA: Diagnosis not present

## 2014-06-17 DIAGNOSIS — Z794 Long term (current) use of insulin: Secondary | ICD-10-CM

## 2014-06-17 DIAGNOSIS — Z886 Allergy status to analgesic agent status: Secondary | ICD-10-CM | POA: Diagnosis not present

## 2014-06-17 DIAGNOSIS — Z79899 Other long term (current) drug therapy: Secondary | ICD-10-CM | POA: Diagnosis not present

## 2014-06-17 DIAGNOSIS — Z955 Presence of coronary angioplasty implant and graft: Secondary | ICD-10-CM

## 2014-06-17 DIAGNOSIS — Z9889 Other specified postprocedural states: Secondary | ICD-10-CM | POA: Diagnosis not present

## 2014-06-17 DIAGNOSIS — Z7902 Long term (current) use of antithrombotics/antiplatelets: Secondary | ICD-10-CM | POA: Diagnosis not present

## 2014-06-17 DIAGNOSIS — I10 Essential (primary) hypertension: Secondary | ICD-10-CM | POA: Diagnosis present

## 2014-06-17 DIAGNOSIS — I252 Old myocardial infarction: Secondary | ICD-10-CM

## 2014-06-17 DIAGNOSIS — I999 Unspecified disorder of circulatory system: Secondary | ICD-10-CM

## 2014-06-17 DIAGNOSIS — R262 Difficulty in walking, not elsewhere classified: Secondary | ICD-10-CM | POA: Diagnosis not present

## 2014-06-17 DIAGNOSIS — Z7982 Long term (current) use of aspirin: Secondary | ICD-10-CM | POA: Diagnosis not present

## 2014-06-17 DIAGNOSIS — Z880 Allergy status to penicillin: Secondary | ICD-10-CM | POA: Diagnosis not present

## 2014-06-17 DIAGNOSIS — R0602 Shortness of breath: Secondary | ICD-10-CM | POA: Diagnosis not present

## 2014-06-17 DIAGNOSIS — M79672 Pain in left foot: Secondary | ICD-10-CM

## 2014-06-17 DIAGNOSIS — E669 Obesity, unspecified: Secondary | ICD-10-CM | POA: Diagnosis present

## 2014-06-17 DIAGNOSIS — I70262 Atherosclerosis of native arteries of extremities with gangrene, left leg: Secondary | ICD-10-CM | POA: Diagnosis not present

## 2014-06-17 DIAGNOSIS — L98499 Non-pressure chronic ulcer of skin of other sites with unspecified severity: Secondary | ICD-10-CM | POA: Diagnosis not present

## 2014-06-17 HISTORY — PX: AXILLARY-FEMORAL BYPASS GRAFT: SHX894

## 2014-06-17 LAB — GLUCOSE, CAPILLARY
GLUCOSE-CAPILLARY: 116 mg/dL — AB (ref 70–99)
Glucose-Capillary: 146 mg/dL — ABNORMAL HIGH (ref 70–99)
Glucose-Capillary: 135 mg/dL — ABNORMAL HIGH (ref 70–99)
Glucose-Capillary: 98 mg/dL (ref 70–99)

## 2014-06-17 SURGERY — CREATION, BYPASS, ARTERIAL, AXILLARY TO BILATERAL FEMORAL, USING GRAFT
Anesthesia: General | Site: Neck | Laterality: Left

## 2014-06-17 MED ORDER — MORPHINE SULFATE 2 MG/ML IJ SOLN
2.0000 mg | INTRAMUSCULAR | Status: DC | PRN
  Administered 2014-06-17: 2 mg via INTRAVENOUS
  Administered 2014-06-17 (×3): 4 mg via INTRAVENOUS
  Administered 2014-06-17 – 2014-06-19 (×7): 2 mg via INTRAVENOUS
  Administered 2014-06-19: 5 mg via INTRAVENOUS
  Administered 2014-06-19: 4 mg via INTRAVENOUS
  Filled 2014-06-17 (×2): qty 1
  Filled 2014-06-17: qty 2
  Filled 2014-06-17: qty 1
  Filled 2014-06-17: qty 2
  Filled 2014-06-17: qty 1
  Filled 2014-06-17: qty 2
  Filled 2014-06-17: qty 1
  Filled 2014-06-17 (×2): qty 2
  Filled 2014-06-17: qty 1
  Filled 2014-06-17: qty 3
  Filled 2014-06-17: qty 1

## 2014-06-17 MED ORDER — EPHEDRINE SULFATE 50 MG/ML IJ SOLN
INTRAMUSCULAR | Status: AC
  Filled 2014-06-17: qty 1

## 2014-06-17 MED ORDER — DIPHENHYDRAMINE HCL 50 MG/ML IJ SOLN: 12.5000 mg | Freq: Four times a day (QID) | INTRAMUSCULAR | Status: DC | PRN

## 2014-06-17 MED ORDER — PHENYLEPHRINE 40 MCG/ML (10ML) SYRINGE FOR IV PUSH (FOR BLOOD PRESSURE SUPPORT)
PREFILLED_SYRINGE | INTRAVENOUS | Status: AC
  Filled 2014-06-17: qty 10

## 2014-06-17 MED ORDER — ROCURONIUM BROMIDE 50 MG/5ML IV SOLN
INTRAVENOUS | Status: AC
  Filled 2014-06-17: qty 1

## 2014-06-17 MED ORDER — POLYETHYLENE GLYCOL 3350 17 G PO PACK
17.0000 g | PACK | Freq: Every day | ORAL | Status: DC | PRN
  Filled 2014-06-17: qty 1

## 2014-06-17 MED ORDER — NEOSTIGMINE METHYLSULFATE 10 MG/10ML IV SOLN
INTRAVENOUS | Status: DC | PRN
  Administered 2014-06-17: 3 mg via INTRAVENOUS

## 2014-06-17 MED ORDER — OXYCODONE HCL 5 MG PO TABS: 5.0000 mg | ORAL_TABLET | Freq: Once | ORAL | Status: DC | PRN

## 2014-06-17 MED ORDER — LABETALOL HCL 5 MG/ML IV SOLN
10.0000 mg | INTRAVENOUS | Status: DC | PRN
  Filled 2014-06-17: qty 4

## 2014-06-17 MED ORDER — SUCCINYLCHOLINE CHLORIDE 20 MG/ML IJ SOLN
INTRAMUSCULAR | Status: AC
  Filled 2014-06-17: qty 1

## 2014-06-17 MED ORDER — OXYCODONE HCL 5 MG/5ML PO SOLN: 5.0000 mg | Freq: Once | ORAL | Status: DC | PRN

## 2014-06-17 MED ORDER — SODIUM CHLORIDE 0.9 % IV SOLN: INTRAVENOUS | Status: DC

## 2014-06-17 MED ORDER — MORPHINE SULFATE (PF) 1 MG/ML IV SOLN: INTRAVENOUS | Status: DC

## 2014-06-17 MED ORDER — SILVER SULFADIAZINE 1 % EX CREA: 1.0000 "application " | TOPICAL_CREAM | Freq: Every day | CUTANEOUS | Status: DC | PRN

## 2014-06-17 MED ORDER — METOPROLOL TARTRATE 1 MG/ML IV SOLN: 2.0000 mg | INTRAVENOUS | Status: DC | PRN

## 2014-06-17 MED ORDER — OXYCODONE HCL 5 MG PO TABS
5.0000 mg | ORAL_TABLET | ORAL | Status: DC | PRN
  Administered 2014-06-17 – 2014-06-21 (×12): 10 mg via ORAL
  Filled 2014-06-17 (×12): qty 2

## 2014-06-17 MED ORDER — SODIUM CHLORIDE 0.9 % IJ SOLN: 9.0000 mL | INTRAMUSCULAR | Status: DC | PRN

## 2014-06-17 MED ORDER — CHLORHEXIDINE GLUCONATE CLOTH 2 % EX PADS: 6.0000 | MEDICATED_PAD | Freq: Once | CUTANEOUS | Status: DC

## 2014-06-17 MED ORDER — HEPARIN SODIUM (PORCINE) 1000 UNIT/ML IJ SOLN
INTRAMUSCULAR | Status: DC | PRN
  Administered 2014-06-17: 6000 [IU] via INTRAVENOUS

## 2014-06-17 MED ORDER — ENOXAPARIN SODIUM 30 MG/0.3ML ~~LOC~~ SOLN
30.0000 mg | SUBCUTANEOUS | Status: DC
  Administered 2014-06-18 – 2014-06-19 (×2): 30 mg via SUBCUTANEOUS
  Filled 2014-06-17 (×2): qty 0.3

## 2014-06-17 MED ORDER — HYDROMORPHONE HCL 1 MG/ML IJ SOLN
0.2500 mg | INTRAMUSCULAR | Status: DC | PRN
  Administered 2014-06-17 (×4): 0.5 mg via INTRAVENOUS

## 2014-06-17 MED ORDER — ROCURONIUM BROMIDE 100 MG/10ML IV SOLN
INTRAVENOUS | Status: DC | PRN
  Administered 2014-06-17: 30 mg via INTRAVENOUS
  Administered 2014-06-17: 20 mg via INTRAVENOUS

## 2014-06-17 MED ORDER — HYDROMORPHONE HCL 1 MG/ML IJ SOLN
INTRAMUSCULAR | Status: AC
  Filled 2014-06-17: qty 1

## 2014-06-17 MED ORDER — PANTOPRAZOLE SODIUM 40 MG PO TBEC
40.0000 mg | DELAYED_RELEASE_TABLET | Freq: Every day | ORAL | Status: DC
  Administered 2014-06-18 – 2014-06-21 (×4): 40 mg via ORAL
  Filled 2014-06-17 (×4): qty 1

## 2014-06-17 MED ORDER — VANCOMYCIN HCL IN DEXTROSE 750-5 MG/150ML-% IV SOLN
750.0000 mg | Freq: Two times a day (BID) | INTRAVENOUS | Status: DC
  Administered 2014-06-17 – 2014-06-21 (×8): 750 mg via INTRAVENOUS
  Filled 2014-06-17 (×10): qty 150

## 2014-06-17 MED ORDER — SIMVASTATIN 20 MG PO TABS
20.0000 mg | ORAL_TABLET | Freq: Every day | ORAL | Status: DC
  Administered 2014-06-17 – 2014-06-20 (×4): 20 mg via ORAL
  Filled 2014-06-17 (×5): qty 1

## 2014-06-17 MED ORDER — ONDANSETRON HCL 4 MG/2ML IJ SOLN
INTRAMUSCULAR | Status: DC | PRN
  Administered 2014-06-17: 4 mg via INTRAVENOUS

## 2014-06-17 MED ORDER — HYDROMORPHONE HCL 1 MG/ML IJ SOLN: 1.0000 mg | INTRAMUSCULAR | Status: DC

## 2014-06-17 MED ORDER — PROPOFOL 10 MG/ML IV BOLUS
INTRAVENOUS | Status: AC
  Filled 2014-06-17: qty 20

## 2014-06-17 MED ORDER — ONDANSETRON HCL 4 MG/2ML IJ SOLN: 4.0000 mg | Freq: Four times a day (QID) | INTRAMUSCULAR | Status: DC | PRN

## 2014-06-17 MED ORDER — SUCCINYLCHOLINE CHLORIDE 20 MG/ML IJ SOLN
INTRAMUSCULAR | Status: DC | PRN
  Administered 2014-06-17: 100 mg via INTRAVENOUS

## 2014-06-17 MED ORDER — LIDOCAINE HCL (CARDIAC) 20 MG/ML IV SOLN
INTRAVENOUS | Status: AC
  Filled 2014-06-17: qty 5

## 2014-06-17 MED ORDER — CLOPIDOGREL BISULFATE 75 MG PO TABS
75.0000 mg | ORAL_TABLET | Freq: Every day | ORAL | Status: DC
  Administered 2014-06-18 – 2014-06-21 (×4): 75 mg via ORAL
  Filled 2014-06-17 (×4): qty 1

## 2014-06-17 MED ORDER — GUAIFENESIN-DM 100-10 MG/5ML PO SYRP: 15.0000 mL | ORAL_SOLUTION | ORAL | Status: DC | PRN

## 2014-06-17 MED ORDER — LACTATED RINGERS IV SOLN
INTRAVENOUS | Status: DC | PRN
  Administered 2014-06-17 (×2): via INTRAVENOUS

## 2014-06-17 MED ORDER — FENTANYL CITRATE 0.05 MG/ML IJ SOLN
INTRAMUSCULAR | Status: DC | PRN
  Administered 2014-06-17 (×2): 50 ug via INTRAVENOUS
  Administered 2014-06-17: 100 ug via INTRAVENOUS
  Administered 2014-06-17: 50 ug via INTRAVENOUS

## 2014-06-17 MED ORDER — POTASSIUM CHLORIDE CRYS ER 20 MEQ PO TBCR: 20.0000 meq | EXTENDED_RELEASE_TABLET | Freq: Every day | ORAL | Status: DC | PRN

## 2014-06-17 MED ORDER — DIPHENHYDRAMINE HCL 12.5 MG/5ML PO ELIX
12.5000 mg | ORAL_SOLUTION | Freq: Four times a day (QID) | ORAL | Status: DC | PRN
  Filled 2014-06-17: qty 5

## 2014-06-17 MED ORDER — HEPARIN SODIUM (PORCINE) 1000 UNIT/ML IJ SOLN
INTRAMUSCULAR | Status: AC
  Filled 2014-06-17: qty 1

## 2014-06-17 MED ORDER — DEXTROSE 5 % IV SOLN
10.0000 mg | INTRAVENOUS | Status: DC | PRN
  Administered 2014-06-17: 20 ug/min via INTRAVENOUS

## 2014-06-17 MED ORDER — SODIUM CHLORIDE 0.9 % IV SOLN
INTRAVENOUS | Status: DC
  Administered 2014-06-17: 15:00:00 via INTRAVENOUS

## 2014-06-17 MED ORDER — PHENOL 1.4 % MT LIQD: 1.0000 | OROMUCOSAL | Status: DC | PRN

## 2014-06-17 MED ORDER — ADULT MULTIVITAMIN W/MINERALS CH
1.0000 | ORAL_TABLET | Freq: Every day | ORAL | Status: DC
  Administered 2014-06-18 – 2014-06-21 (×4): 1 via ORAL
  Filled 2014-06-17 (×4): qty 1

## 2014-06-17 MED ORDER — PROPOFOL 10 MG/ML IV BOLUS
INTRAVENOUS | Status: DC | PRN
  Administered 2014-06-17: 200 mg via INTRAVENOUS

## 2014-06-17 MED ORDER — ASPIRIN EC 81 MG PO TBEC
81.0000 mg | DELAYED_RELEASE_TABLET | Freq: Every day | ORAL | Status: DC
  Administered 2014-06-18 – 2014-06-21 (×4): 81 mg via ORAL
  Filled 2014-06-17 (×4): qty 1

## 2014-06-17 MED ORDER — NALOXONE HCL 0.4 MG/ML IJ SOLN: 0.4000 mg | INTRAMUSCULAR | Status: DC | PRN

## 2014-06-17 MED ORDER — METFORMIN HCL ER 500 MG PO TB24
1000.0000 mg | ORAL_TABLET | Freq: Two times a day (BID) | ORAL | Status: DC
  Administered 2014-06-17: 1000 mg via ORAL
  Filled 2014-06-17 (×6): qty 2

## 2014-06-17 MED ORDER — SODIUM CHLORIDE 0.9 % IV SOLN: 500.0000 mL | Freq: Once | INTRAVENOUS | Status: AC | PRN

## 2014-06-17 MED ORDER — SODIUM CHLORIDE 0.9 % IJ SOLN
INTRAMUSCULAR | Status: AC
  Filled 2014-06-17: qty 10

## 2014-06-17 MED ORDER — INSULIN ASPART 100 UNIT/ML ~~LOC~~ SOLN
0.0000 [IU] | Freq: Three times a day (TID) | SUBCUTANEOUS | Status: DC
Start: 2014-06-17 — End: 2014-06-21
  Administered 2014-06-19: 2 [IU] via SUBCUTANEOUS
  Administered 2014-06-20: 5 [IU] via SUBCUTANEOUS
  Administered 2014-06-20: 3 [IU] via SUBCUTANEOUS
  Administered 2014-06-20: 8 [IU] via SUBCUTANEOUS
  Administered 2014-06-21: 3 [IU] via SUBCUTANEOUS

## 2014-06-17 MED ORDER — GLYCOPYRROLATE 0.2 MG/ML IJ SOLN
INTRAMUSCULAR | Status: DC | PRN
  Administered 2014-06-17: 0.4 mg via INTRAVENOUS

## 2014-06-17 MED ORDER — 0.9 % SODIUM CHLORIDE (POUR BTL) OPTIME
TOPICAL | Status: DC | PRN
  Administered 2014-06-17: 2000 mL

## 2014-06-17 MED ORDER — HYDRALAZINE HCL 20 MG/ML IJ SOLN
5.0000 mg | INTRAMUSCULAR | Status: DC | PRN
  Administered 2014-06-19: 5 mg via INTRAVENOUS
  Filled 2014-06-17: qty 1

## 2014-06-17 MED ORDER — LIDOCAINE HCL (CARDIAC) 20 MG/ML IV SOLN
INTRAVENOUS | Status: DC | PRN
  Administered 2014-06-17: 80 mg via INTRATRACHEAL
  Administered 2014-06-17: 100 mg via INTRAVENOUS

## 2014-06-17 MED ORDER — ACETAMINOPHEN 325 MG PO TABS
325.0000 mg | ORAL_TABLET | ORAL | Status: DC | PRN
  Administered 2014-06-17 – 2014-06-19 (×5): 650 mg via ORAL
  Filled 2014-06-17 (×5): qty 2

## 2014-06-17 MED ORDER — INDOMETHACIN 50 MG PO CAPS
50.0000 mg | ORAL_CAPSULE | Freq: Two times a day (BID) | ORAL | Status: DC | PRN
  Filled 2014-06-17: qty 1

## 2014-06-17 MED ORDER — METHOCARBAMOL 500 MG PO TABS
500.0000 mg | ORAL_TABLET | Freq: Four times a day (QID) | ORAL | Status: DC | PRN
  Filled 2014-06-17: qty 1

## 2014-06-17 MED ORDER — ALUM & MAG HYDROXIDE-SIMETH 200-200-20 MG/5ML PO SUSP: 15.0000 mL | ORAL | Status: DC | PRN

## 2014-06-17 MED ORDER — AMLODIPINE BESYLATE 10 MG PO TABS
10.0000 mg | ORAL_TABLET | Freq: Every day | ORAL | Status: DC
  Administered 2014-06-18 – 2014-06-21 (×4): 10 mg via ORAL
  Filled 2014-06-17 (×4): qty 1

## 2014-06-17 MED ORDER — GLIMEPIRIDE 4 MG PO TABS
4.0000 mg | ORAL_TABLET | Freq: Two times a day (BID) | ORAL | Status: DC
  Administered 2014-06-17: 4 mg via ORAL
  Filled 2014-06-17 (×6): qty 1

## 2014-06-17 MED ORDER — SODIUM CHLORIDE 0.9 % IR SOLN
Status: DC | PRN
  Administered 2014-06-17: 500 mL

## 2014-06-17 MED ORDER — FENTANYL CITRATE 0.05 MG/ML IJ SOLN
INTRAMUSCULAR | Status: AC
  Filled 2014-06-17: qty 5

## 2014-06-17 MED ORDER — OXYCODONE HCL 5 MG PO TABS
5.0000 mg | ORAL_TABLET | Freq: Four times a day (QID) | ORAL | Status: DC | PRN
  Administered 2014-06-17: 5 mg via ORAL
  Filled 2014-06-17: qty 1

## 2014-06-17 MED ORDER — ACETAMINOPHEN 325 MG RE SUPP
325.0000 mg | RECTAL | Status: DC | PRN
  Filled 2014-06-17: qty 2

## 2014-06-17 MED ORDER — DOCUSATE SODIUM 100 MG PO CAPS
100.0000 mg | ORAL_CAPSULE | Freq: Every day | ORAL | Status: DC
  Administered 2014-06-18 – 2014-06-20 (×3): 100 mg via ORAL
  Filled 2014-06-17 (×4): qty 1

## 2014-06-17 MED ORDER — INSULIN GLARGINE 100 UNIT/ML ~~LOC~~ SOLN
50.0000 [IU] | Freq: Two times a day (BID) | SUBCUTANEOUS | Status: DC
  Filled 2014-06-17 (×3): qty 0.5

## 2014-06-17 MED ORDER — MAGNESIUM SULFATE 2 GM/50ML IV SOLN: 2.0000 g | Freq: Every day | INTRAVENOUS | Status: DC | PRN

## 2014-06-17 MED ORDER — LISINOPRIL 40 MG PO TABS
40.0000 mg | ORAL_TABLET | Freq: Every day | ORAL | Status: DC
  Administered 2014-06-17 – 2014-06-20 (×4): 40 mg via ORAL
  Filled 2014-06-17 (×5): qty 1

## 2014-06-17 MED ORDER — PROTAMINE SULFATE 10 MG/ML IV SOLN
INTRAVENOUS | Status: DC | PRN
  Administered 2014-06-17: 20 mg via INTRAVENOUS
  Administered 2014-06-17 (×3): 10 mg via INTRAVENOUS

## 2014-06-17 SURGICAL SUPPLY — 50 items
BNDG GAUZE ELAST 4 BULKY (GAUZE/BANDAGES/DRESSINGS) ×2 IMPLANT
CANISTER SUCTION 2500CC (MISCELLANEOUS) ×3 IMPLANT
CATH EMB 4FR 80CM (CATHETERS) ×2 IMPLANT
CLIP TI MEDIUM 24 (CLIP) ×3 IMPLANT
CLIP TI WIDE RED SMALL 24 (CLIP) ×3 IMPLANT
COVER SURGICAL LIGHT HANDLE (MISCELLANEOUS) ×1 IMPLANT
DOPPLER CAUTERY SUPPRESSOR (INSTRUMENTS) ×2 IMPLANT
DRAIN SNY 10X20 3/4 PERF (WOUND CARE) IMPLANT
DRAPE INCISE IOBAN 66X45 STRL (DRAPES) ×5 IMPLANT
ELECT REM PT RETURN 9FT ADLT (ELECTROSURGICAL) ×3
ELECTRODE REM PT RTRN 9FT ADLT (ELECTROSURGICAL) ×1 IMPLANT
EVACUATOR SILICONE 100CC (DRAIN) IMPLANT
GAUZE SPONGE 4X4 12PLY STRL (GAUZE/BANDAGES/DRESSINGS) ×3 IMPLANT
GLOVE BIO SURGEON STRL SZ 6.5 (GLOVE) ×2 IMPLANT
GLOVE BIO SURGEONS STRL SZ 6.5 (GLOVE) ×2
GLOVE BIOGEL PI IND STRL 6.5 (GLOVE) IMPLANT
GLOVE BIOGEL PI IND STRL 7.0 (GLOVE) IMPLANT
GLOVE BIOGEL PI INDICATOR 6.5 (GLOVE) ×4
GLOVE BIOGEL PI INDICATOR 7.0 (GLOVE) ×2
GLOVE ECLIPSE 7.5 STRL STRAW (GLOVE) ×2 IMPLANT
GLOVE SS BIOGEL STRL SZ 7 (GLOVE) ×1 IMPLANT
GLOVE SUPERSENSE BIOGEL SZ 7 (GLOVE) ×4
GLOVE SURG SS PI 7.0 STRL IVOR (GLOVE) ×2 IMPLANT
GOWN STRL REUS W/ TWL LRG LVL3 (GOWN DISPOSABLE) ×3 IMPLANT
GOWN STRL REUS W/TWL LRG LVL3 (GOWN DISPOSABLE) ×12
GRAFT CV 60X8STRG TUBE KNTD (Vascular Products) IMPLANT
GRAFT HEMASHIELD 8MM (Vascular Products) ×3 IMPLANT
INSERT FOGARTY SM (MISCELLANEOUS) ×3 IMPLANT
KIT BASIN OR (CUSTOM PROCEDURE TRAY) ×3 IMPLANT
KIT ROOM TURNOVER OR (KITS) ×3 IMPLANT
LIQUID BAND (GAUZE/BANDAGES/DRESSINGS) ×3 IMPLANT
NS IRRIG 1000ML POUR BTL (IV SOLUTION) ×6 IMPLANT
PACK PERIPHERAL VASCULAR (CUSTOM PROCEDURE TRAY) ×3 IMPLANT
PAD ARMBOARD 7.5X6 YLW CONV (MISCELLANEOUS) ×6 IMPLANT
SUT ETHILON 3 0 PS 1 (SUTURE) ×4 IMPLANT
SUT PROLENE 5 0 C 1 24 (SUTURE) ×6 IMPLANT
SUT PROLENE 5 0 C1 (SUTURE) IMPLANT
SUT PROLENE 6 0 BV (SUTURE) ×3 IMPLANT
SUT PROLENE 6 0 C 1 24 (SUTURE) ×2 IMPLANT
SUT PROLENE 6 0 CC (SUTURE) ×4 IMPLANT
SUT SILK 2 0 SH (SUTURE) ×3 IMPLANT
SUT VIC AB 2-0 CT1 18 (SUTURE) ×2 IMPLANT
SUT VIC AB 2-0 CTX 36 (SUTURE) ×6 IMPLANT
SUT VIC AB 3-0 SH 27 (SUTURE) ×9
SUT VIC AB 3-0 SH 27X BRD (SUTURE) ×3 IMPLANT
SUT VICRYL 4-0 PS2 18IN ABS (SUTURE) ×2 IMPLANT
SYR 3ML LL SCALE MARK (SYRINGE) ×2 IMPLANT
TAPE CLOTH SURG 4X10 WHT LF (GAUZE/BANDAGES/DRESSINGS) ×2 IMPLANT
TRAY FOLEY CATH 16FRSI W/METER (SET/KITS/TRAYS/PACK) ×3 IMPLANT
WATER STERILE IRR 1000ML POUR (IV SOLUTION) ×3 IMPLANT

## 2014-06-17 NOTE — Anesthesia Postprocedure Evaluation (Signed)
Anesthesia Post Note  Patient: Logan [name]  Procedure(s) Performed: Procedure(s) (LRB):  LEFT AXILLA- LEFT FEMORAL ARTERTY BYPASS GRAFT (Left)  Anesthesia type: General  Patient location: PACU  Post pain: Pain level controlled and Adequate analgesia  Post assessment: Post-op Vital signs reviewed, Patient's Cardiovascular Status Stable, Respiratory Function Stable, Patent Airway and Pain level controlled  Last Vitals:  Filed Vitals:   06/17/14 1100  BP: 130/68  Pulse:   Temp:   Resp:     Post vital signs: Reviewed and stable  Level of consciousness: awake, alert  and oriented  Complications: No apparent anesthesia complications

## 2014-06-17 NOTE — Anesthesia Procedure Notes (Signed)
Procedure Name: Intubation Date/Time: 06/17/2014 7:43 AM Performed by: Julian Reil Pre-anesthesia Checklist: Patient identified, Emergency Drugs available, Patient being monitored and Suction available Patient Re-evaluated:Patient Re-evaluated prior to inductionOxygen Delivery Method: Circle system utilized Preoxygenation: Pre-oxygenation with 100% oxygen Intubation Type: IV induction Ventilation: Mask ventilation without difficulty and Oral airway inserted - appropriate to patient size Laryngoscope Size: Mac and 4 Grade View: Grade II Tube type: Oral Tube size: 7.5 mm Number of attempts: 1 Airway Equipment and Method: Stylet and LTA kit utilized Placement Confirmation: ETT inserted through vocal cords under direct vision,  positive ETCO2 and breath sounds checked- equal and bilateral Secured at: 22 cm Tube secured with: Tape Dental Injury: Teeth and Oropharynx as per pre-operative assessment

## 2014-06-17 NOTE — Addendum Note (Signed)
Addendum  created 06/17/14 1118 by Julian Reil, CRNA   Modules edited: Anesthesia Medication Administration

## 2014-06-17 NOTE — Op Note (Signed)
OPERATIVE REPORT  Date of Surgery: 06/17/2014  Surgeon: Tinnie Gens, MD  Assistant: Leontine Locket PA  Pre-op Diagnosis: Peripheral vascular disease with left foot ulcer with previous occlusion of aortobifemoral bypass, and femoral-femoral bypass 2 and left femoral-popliteal bypass. Post-op Diagnosis: Same  Procedure: Procedure(s):  Left axial load to profunda femoris bypass using 8 mm Hemashield Dacron graft  Anesthesia: General  EBL: 947 cc  Complications: None  The patient was taken the operating are placed in supine position at which time satisfactory general endotracheal last seizure was minister. The left chest abdomen and left inguinal areas down to the mid thigh oral prepped Betadine scrub and solution draped routine sterile manner. Left inguinal region had been previously operated on multiple times with previous infection proximally of the femoral-femoral bypass which was chronically occluded. Incision was made distal to the inguinal crease to the previous scar trying to avoid the femoral-femoral bypass. Chronically occluded superficial femoral artery was identified and deep dissection down beginning at the tip of the femoral-femoral graft into the origin of the profunda femoris artery dissection continued distally and the profunda was dissected free down about 4-5 cm distal to this point where it was a large vessel and free of any scar tissue. It was encircled with Vesseloops. Infraclavicular incision was made on the left side carried down through subcutaneous tissue. Dissection continued between the fibers the pectoralis major muscle. Pectoralis minor muscle was divided with Bovie. Axillary artery was identified and encircled with Vesseloops excellent pulse. Care was taken not to injure the vein or the brachial plexus. A tunnel was then made along the anterior to mid axillary line oozing along tunnel are beginning proximally and extending down through the inguinal wound and an 8 mm  Hemashield Dacron graft was delivered through the tunnel patient was heparinized. Axillary artery occluded proximally and distally with vascular clamps open 15 blade extended with Potts scissors. Had excellent inflow. The background was very slightly spatulated and anastomosed inside with 6-0 Prolene. Clamps released nose excellent pulse in the artery distal to the anastomosis. Attention turned to the inguinal area where the profunda was occluded proximally and distally with vascular clamps. Longitudinal opening made 15 blade extended with Potts scissors. There was sluggish inflow but fairly good backbleeding. A 4 Fogarty catheter was passed distally there was no thrombus and it would go about 10-15 cm. The Dacron graft was carefully measured spatulated and anastomosed inside with 6-0 Prolene. Clamps released there was excellent pulse in much improved Doppler flow in the profunda femoris artery. Protamine given to reverse the heparin at hemostasis achieved the wounds closed in layers with Vicryl oozing a subcuticular fashion for the chest wound area of the inguinal wound had dense scar tissue and was contracted down therefore it was closed with interrupted 2-0 Vicryl for the deep layers and 3-0 nylon vertical mattress sutures for the skin sterile dressing applied patient taken to recovery in stable condition  Procedure Details:   Tinnie Gens, MD 06/17/2014 10:11 AM

## 2014-06-17 NOTE — Interval H&P Note (Signed)
History and Physical Interval Note:  06/17/2014 7:32 AM  Logan [name]  has presented today for surgery, with the diagnosis of Peripheral vascular disease with left foot ulcer I70.244  The various methods of treatment have been discussed with the patient and family. After consideration of risks, benefits and other options for treatment, the patient has consented to  Procedure(s): BYPASS GRAFT AXILLA-FEMORAL (Left) as a surgical intervention .  The patient's history has been reviewed, patient examined, no change in status, stable for surgery.  I have reviewed the patient's chart and labs.  Questions were answered to the patient's satisfaction.     Tinnie Gens

## 2014-06-17 NOTE — Progress Notes (Signed)
        Patient reports pain at incision sites.  No active bleeding. Palpable graft pulses.  S/PLeft axial load to profunda femoris bypass using 8 mm Hemashield Dacron graft  Chassie Pennix MAUREEN PA-C

## 2014-06-17 NOTE — Progress Notes (Signed)
Dr. Kellie Simmering called and updated status and increase pain in left foot. Pain medicine order received.

## 2014-06-17 NOTE — H&P (View-Only) (Signed)
[facility] [place] HISTORY AND PHYSICAL -PAD  History of Present Illness Logan [name] is a 69 y.o. male  patient of Dr. Kellie Simmering had an aortobifemoral bypass graft with occlusion of the right limb. He also has a left-to-right femoral-femoral bypass graft on 12/20/11. He had a history of a draining sinus from the left inguinal area. This closed without any evidence of residual infection. Patient developed a scrotal abscess apparently at his 2013 visit and had a small draining sinus lateral to the right inguinal incision, this has healed. The patient returns today referred by Dr. Blanch Media, podiatrist, for worsening left foot ulcer,   He has claudication in both calves with standing and walking only about 20 feet, relieved by rest; this severe claudication started when he ran out of his short acting insulin in May or June 2015 and his his serum glucose increased significantly.  Pt denies any history of stroke or TIA. He has cardiac stents, did not experience any symptoms of an MI, but was told that he had an MI.  Pt Diabetic: Yes, states his home glucose readings are around 150 Pt smoker: smoker (1 cigar every 2-4 days, stopped cigarettes in 2012)  Pt meds include: Statin :Yes ASA: Yes Other anticoagulants/antiplatelets: he states that he may be taking clopidogrel, prescribed by Dr. Brigitte Pulse for his PAD, per pt   Dr. Evelena Leyden progress note from 02/22/14:  Patient with occlusion of aortobifemoral graft with history of draining sinus and left inguinal area. No active infection at the present time. No limb threatening ischemia at the present time. Patient is tolerating occlusion of the graft. Will not plan any inflow procedure as long as patient does not have limb threatening ischemia because of high likelihood of infection. He would need axillofemoral graft and high likelihood that left inguinal area would become infected since there has been infection there in the past. Discusses with  patient and he is in agreement with that. He will return in 6 months with repeat ABIs unless symptoms worsen.    Past Medical History  Diagnosis Date  . Overweight(278.02)   . Dyslipidemia   . Cardiomyopathy   . Diabetes mellitus   . PVD (peripheral vascular disease)     right to left fem -fem bypass 1997/ aortabifemoral bypass 2005 right to left fem-fem 07/2004. Secondary to occluded righ tlimb of aortobifemoral bypass  . Tobacco abuse   . Anginal pain   . Hypertension   . Shortness of breath   . Coronary artery disease     taxis DES stent circumflex 02/2003.... residual total distal circumflex and 50% LAD/ cardiolite 08/2006 inferior scar no ischemia  . Degenerative disc disease, lumbar   . Coronary artery disease   . Myocardial infarction     Social History History  Substance Use Topics  . Smoking status: Current Some Day Smoker -- 40 years    Types: Cigars    Last Attempt to Quit: 12/20/2011  . Smokeless tobacco: Never Used  . Alcohol Use: Yes     Comment: liquior sometimes    Family History Family History  Problem Relation Age of Onset  . Hypertension Mother   . Varicose Veins Mother   . Hypertension Brother     Past Surgical History  Procedure Laterality Date  . Knee surgery    . Axillary-femoral bypass graft  12/20/2011    Procedure: BYPASS GRAFT AXILLA-BIFEMORAL;  Surgeon: Conrad Navarino, MD;  Location: [place];  Service: Vascular;  Laterality: Bilateral;  Left  aorta bilateral femoral thrombectomy, femoral to femoral thrombectomy, and revision of Femoral to femoral graft. and Thrombectomy of Left tibial artery.  . Fasciotomy  12/20/2011    Procedure: FASCIOTOMY;  Surgeon: Conrad Winslow West, MD;  Location: [place];  Service: Vascular;  Laterality: Right;  right calf facsicotomy.  . Lower extremity angiogram  12/20/2011    Procedure: LOWER EXTREMITY ANGIOGRAM;  Surgeon: Conrad [place]onal[place], MD;  Location: [place];  Service: Vascular;  Laterality: Bilateral;  Intraoperative Abdominal  Aortogram with bilateral runoff  . Left-to-right femoral-femoral bypass graft  07-30-2004  DR LAWSON    OCCLUSION RIGHT LIMB AORTOBIFEMORAL BYPASS GRAFT  W/ SEVERE CLAUDICATION  . Aorto to right common femoral and left profunda femoris bypass graft  09-28-2003 DR LAWSON    SEVERE AORTOILIAC OCCLUSIVE DISEASE W/ OCCLUDED FEM-FEM BYPASS OF BOTH LOWER EXTREMITIES  . Right lateral parotidectomy with facial verve preservation  02-25-2001  DR [facility]    NEOPLASM RIGHT PAROTID GLAND  . Coronary angioplasty with stent placement  02-23-2003  DR [facility]    PCI W/ STENTING PROXIMAL CIRCUMFLEX  . Femoral-femoral bypass graft  1997  DR LAWSON    RIGHT TO LEFT  . Debridement leg  12/25/11  . Incision and drainage of wound  02/17/2012    Procedure: IRRIGATION AND DEBRIDEMENT WOUND;  Surgeon: Theodoro Kos, DO;  Location: [place];  Service: Plastics;  Laterality: Bilateral;  . Lumbar laminectomy/decompression microdiscectomy  06/02/2012    Procedure: LUMBAR LAMINECTOMY/DECOMPRESSION MICRODISCECTOMY 1 LEVEL;  Surgeon: Tobi Bastos, MD;  Location: [facility];  Service: Orthopedics;  Laterality: N/A;  Central Decompression Lumbar Laminectomy L5-S1   . Spine surgery    . Ilecolonscopy  05/05/07    RMR:ana; papilla otherwise normal rectum pancolonic diverticula    Allergies  Allergen Reactions  . Aspirin Hives  . Penicillins Rash and Other (See Comments)    Immune system shut down     Current Outpatient Prescriptions  Medication Sig Dispense Refill  . amLODipine (NORVASC) 10 MG tablet Take 10 mg by mouth daily at 12 noon.     Marland Kitchen aspirin EC 81 MG tablet Take 81 mg by mouth every morning.     . chlorhexidine (HIBICLENS) 4 % external liquid Wash head to toe in shower once daily at night for 7 days, no deodorant creams until one hour after this shower 237 mL 3  . clopidogrel (PLAVIX) 75 MG tablet Take 75 mg by mouth daily at 12 noon.     Marland Kitchen doxycycline (VIBRAMYCIN) 100 MG capsule Take 1 capsule  (100 mg total) by mouth 2 (two) times daily. One po bid x 7 days 14 capsule 0  . glimepiride (AMARYL) 4 MG tablet Take 4 mg by mouth 2 (two) times daily.     Marland Kitchen HUMALOG 100 UNIT/ML injection Inject 10 Units into the skin 3 (three) times daily with meals.   0  . indomethacin (INDOCIN) 50 MG capsule Take 50 mg by mouth 2 (two) times daily with a meal. Gout flare-up.    Marland Kitchen insulin aspart (NOVOLOG) 100 UNIT/ML injection CBG 70 - 120: 0 units CBG 121 - 150: 2 units CBG 151 - 200: 3 units CBG 201 - 250: 5 units CBG 251 - 300: 8 units  Call MD if CBg is greater than 200 1 vial 0  . insulin glargine (LANTUS) 100 UNIT/ML injection Inject 50 Units into the skin 2 (two) times daily.     Marland Kitchen lisinopril (PRINIVIL,ZESTRIL) 40 MG tablet Take 40  mg by mouth every morning.     . metFORMIN (GLUCOPHAGE-XR) 500 MG 24 hr tablet Take 1,000 mg by mouth 2 (two) times daily.     . methocarbamol (ROBAXIN) 500 MG tablet Take 1 tablet (500 mg total) by mouth every 6 (six) hours as needed. 40 tablet 1  . Multiple Vitamins-Minerals (MULTIVITAMIN PO) Take 1 tablet by mouth every morning.     . mupirocin ointment (BACTROBAN) 2 % Intranasally twice daily for 7 days with antibiotic wash 30 g 2  . Omega-3 Fatty Acids (FISH OIL) 1000 MG CAPS Take 2 capsules by mouth 2 (two) times daily.     Marland Kitchen oxyCODONE-acetaminophen (PERCOCET) 5-325 MG per tablet Take 1-2 tablets by mouth every 4 (four) hours as needed. 20 tablet 0  . polyethylene glycol (MIRALAX / GLYCOLAX) packet Take 17 g by mouth daily as needed. 14 each 0  . saccharomyces boulardii (FLORASTOR) 250 MG capsule Take 1 capsule (250 mg total) by mouth 2 (two) times daily. 1 capsule 30  . simvastatin (ZOCOR) 20 MG tablet Take 20 mg by mouth every evening.    . sulfamethoxazole-trimethoprim (BACTRIM DS,SEPTRA DS) 800-160 MG per tablet Take 2 tablets by mouth 2 (two) times daily.      No current facility-administered medications for this visit.    ROS: See HPI for pertinent  positives and negatives.   Physical Examination  Filed Vitals:   05/31/14 1340  BP: 152/81  Pulse: 102  Temp: 97.6 F (36.4 C)  TempSrc: Oral  Resp: 18  Height: 5\' 9"  (1.753 m)  Weight: 210 lb 11.2 oz (95.573 kg)  SpO2: 98%   Body mass index is 31.1 kg/(m^2).  General: A&O x 3, WDWN, obese male. Gait: normal Eyes: Pupils are equal Pulmonary:Respirations are non labored. Cardiac: regular Rythm , without detected murmur.     Carotid Bruits Right Left   Negative Negative  Aorta is not palpable. Radial pulses: absent right radial,  palpable left radial, palpable right brachial pulse.   VASCULAR EXAM: Extremities with ischemic changes: proximal portion of toes of both feet are dusky, cool, without Gangrene; with open wounds: deep semi moist ulcer medial aspect left 5th toe, no drainage. Eschar debrided from right tibial area, dermis layer ulcer revealed with white tissue.     LE Pulses Right Left   FEMORAL not palpable not palpable    POPLITEAL not palpable  not palpable   POSTERIOR TIBIAL not palpable not palpable    DORSALIS PEDIS  ANTERIOR TIBIAL not palpable not palpable   PERONEAL  not Palpable    not Palpable    Abdomen: soft, NT, no masses palpated. Skin: no rashes, see extremities. Musculoskeletal: no muscle wasting or atrophy. Neurologic: A&O X 3; Appropriate Affect, MOTOR FUNCTION: moving all extremities equally, motor strength 4/5 throughout. Speech is fluent/normal. CN 2-12 grossly intact.   Previous angiogram: 02/22/14 CTA abd/pelvis:  IMPRESSION: An aorto to left femoral artery bypass graft is occluded. Native aorta and iliac arteries are occluded.  Femoral to femoral bypass grafts are occluded.  Right common  femoral artery reconstitutes. Right popliteal artery is occluded. One vessel runoff via the posterior tibial artery.  Left common femoral and superficial femoral arteries are occluded. Left popliteal artery reconstitutes with 1 vessel runoff to the left ankle, the peroneal artery. Thrombus extends from the common femoral artery into the dominant profunda femoral artery branch.   ASSESSMENT: Logan [name] is a 69 y.o. male who presents s/p aortobifemoral bypass graft with occlusion of the  right limb. He also has a left-to-right femoral-femoral bypass graft on 12/20/11.  He has worsening ulcer on medial aspect left 5th toe with severe pain confined to the left 5th toe. Dr. Kellie Simmering debrided right tibial area eschar. Dr. Kellie Simmering viewed images from pt's CTA abdomen/pelvis and discussed limited options for revascularization with patient and wife. Pt and wife could not decide to have the major revascularization surgery offered by Dr. Kellie Simmering to be done in 2 days.  In the meantime he will continue with wound care with the likelihood that left 5th toe ulcer will not be able to heal, possible proximal progression of left and possibly right LE critical ischemia. ABI's on 02/11/14 revealed 0.40 right ABI and 0.44 left ABI with absent bilateral dorsalis pedis waveforms, monophasic bilateral posterior tibial waveforms, and absent bilateral great toe pressures.  02/22/14 CTA abd/pelvis revealed occluded aorto to left femoral artery bypass graft, occluded native aorta and iliac arteries. Femoral to femoral bypass grafts are occluded. Right common femoral artery reconstitutes. Right popliteal artery is occluded. One vessel runoff via the posterior tibial artery. Left common femoral and superficial femoral arteries are occluded. Left popliteal artery reconstitutes with 1 vessel runoff to the left ankle, the peroneal artery. Thrombus extends from the common femoral artery into the dominant profunda femoral artery  branch.  Return to wound care center for left 5th toe ulcer and right tibial area wound care.  Return in 4 weeks to discuss options with Dr. Kellie Simmering. Face to face time with patient was: 30 minutes by myself and 15 minutes by Dr. Kellie Simmering. Over 50% of this time was spent on counseling and coordination of care.    PLAN:  I discussed in depth with the patient the nature of atherosclerosis, and emphasized the importance of maximal medical management including strict control of blood pressure, blood glucose, and lipid levels, obtaining regular exercise, and Refer back tocessation of smoking.  The patient is aware that without maximal medical management the underlying atherosclerotic disease process will progress, limiting the benefit of any interventions. Refer back to Wound care center for daily wound care of right tibial area wound and left 5th toe ulcer. Patient requested prescription for analgesic to address severe pain of ischemia. Oxycodone 5 mg, 1 tablet every 8 hours prn pain, disp #30, 0 refills.  Based on the patient's vascular studies and examination, pt will return to clinic in 4 weeks with ABI's, Dr. Kellie Simmering; he knows to return sooner if needed, and to let us know if he wants to proceed with the revascularization surgery offered by Dr. Kellie Simmering.  The patient was given information about PAD including signs, symptoms, treatment, what symptoms should prompt the patient to seek immediate medical care, and risk reduction measures to take.  Clemon Chambers, RN, MSN, FNP-C [facility] Phone: [phone]  Clinic MD: Kellie Simmering  05/31/2014 1:46 PM   Agree with above assessment Patient has ulceration medial aspect of left fifth toe. Absent femoral and distal pulses bilaterally CTA done in October revealed occlusion of aortobifemoral graft with only patent vessel and left inguinal area being the profunda Patient has had lower extremity bypass grafts in both  legs 1 performed by Dr. Onnie Boer many years ago on the left side.  I think only possible way that left leg would be salvage would be to an attempt left asked low to profunda bypass see if this would be satisfactory to heal fourth and fifth toe amputation if this becomes necessary which I  thank it well. I think patient will likely come to above-knee dictation without revascularization attempt. Multiple problems exist. He has had previous infection in the left inguinal area with draining sinus for several months. Left axillofemoral graft may become infected and have to be removed. Left asked low profunda bypass may not remain patent because of poor runoff. Patient may not heal foot even with patent left axillofemoral graft.  I discussed all of these problems with patient and his wife and did recommend proceeding with surgery this week which he refused. If he changes his mind he will be in touch with [place] and I will see him otherwise in 4 weeks.

## 2014-06-17 NOTE — Progress Notes (Signed)
Patient ID: Logan [name], male   DOB: [birth year], 69 y.o.   MRN: [id number] Dr. Kellie Simmering called me to come by and assess Mr. Marchant's foot.  I will come by this evening after my [place] cases and see him.

## 2014-06-17 NOTE — Consult Note (Signed)
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  HPI:   69 yo male with diabetes and peripheral vascular disease who underwent left lower extremity surgery this am in an attempt to improve blood flow to his left foot.  He already has a 5th toe that is necrotic and will need amputating.  I was asked to see him for further evaluation and management of his foot from an othropedic standpoint.  Past Medical History  Diagnosis Date  . Overweight(278.02)   . Dyslipidemia   . Cardiomyopathy   . PVD (peripheral vascular disease)     right to left fem -fem bypass 1997/ aortabifemoral bypass 2005 right to left fem-fem 07/2004. Secondary to occluded righ tlimb of aortobifemoral bypass  . Tobacco abuse   . Anginal pain   . Hypertension   . Shortness of breath   . Coronary artery disease     taxis DES stent circumflex 02/2003.... residual total distal circumflex and 50% LAD/ cardiolite 08/2006 inferior scar no ischemia  . Degenerative disc disease, lumbar   . Coronary artery disease   . Myocardial infarction   . Diabetes mellitus     type 2  . History of blood transfusion   . Cataracts, bilateral     Past Surgical History  Procedure Laterality Date  . Knee surgery    . Axillary-femoral bypass graft  12/20/2011    Procedure: BYPASS GRAFT AXILLA-BIFEMORAL;  Surgeon: Conrad [place], MD;  Location: [place];  Service: Vascular;  Laterality: Bilateral;  Left aorta bilateral femoral thrombectomy, femoral to femoral thrombectomy, and revision of Femoral to femoral graft. and Thrombectomy of Left tibial artery.  . Fasciotomy  12/20/2011    Procedure: FASCIOTOMY;  Surgeon: Conrad Henefer, MD;  Location: [place];  Service: Vascular;  Laterality: Right;  right calf facsicotomy.  . Lower extremity angiogram  12/20/2011    Procedure: LOWER EXTREMITY ANGIOGRAM;  Surgeon: Conrad Spade, MD;  Location: [place];  Service: Vascular;  Laterality:  Bilateral;  Intraoperative Abdominal Aortogram with bilateral runoff  . Left-to-right femoral-femoral bypass graft  07-30-2004  DR LAWSON    OCCLUSION RIGHT LIMB AORTOBIFEMORAL BYPASS GRAFT  W/ SEVERE CLAUDICATION  . Aorto to right common femoral and left profunda femoris bypass graft  09-28-2003 DR LAWSON    SEVERE AORTOILIAC OCCLUSIVE DISEASE W/ OCCLUDED FEM-FEM BYPASS OF BOTH LOWER EXTREMITIES  . Right lateral parotidectomy with facial verve preservation  02-25-2001  DR [facility]    NEOPLASM RIGHT PAROTID GLAND  . Coronary angioplasty with stent placement  02-23-2003  DR [facility]    PCI W/ STENTING PROXIMAL CIRCUMFLEX  . Femoral-femoral bypass graft  1997  DR LAWSON    RIGHT TO LEFT  . Debridement leg  12/25/11  . Incision and drainage of wound  02/17/2012    Procedure: IRRIGATION AND DEBRIDEMENT WOUND;  Surgeon: Theodoro Kos, DO;  Location: [place];  Service: Plastics;  Laterality: Bilateral;  . Lumbar laminectomy/decompression microdiscectomy  06/02/2012    Procedure: LUMBAR LAMINECTOMY/DECOMPRESSION MICRODISCECTOMY 1 LEVEL;  Surgeon: Tobi Bastos, MD;  [facility];  Service: Orthopedics;  Laterality: N/A;  Central Decompression Lumbar Laminectomy L5-S1   . Spine surgery    . Ilecolonscopy  05/05/07    RMR:ana; papilla otherwise normal rectum pancolonic diverticula  . Colonoscopy      Family History  Problem Relation Age of Onset  . Hypertension Mother   .  Varicose Veins Mother   . Hypertension Brother     Social History:  reports that he has been smoking Cigars.  He has never used smokeless tobacco. He reports that he drinks alcohol. He reports that he does not use illicit drugs.  Allergies:  Allergies  Allergen Reactions  . Aspirin Hives    Tolerates low dose aspirin  . Penicillins Rash and Other (See Comments)    Immune system shut down     Medications: I have reviewed the patient's current medications.  Results for orders placed or performed  during the hospital encounter of 06/17/14 (from the past 48 hour(s))  Glucose, capillary     Status: Abnormal   Collection Time: 06/17/14  5:58 AM  Result Value Ref Range   Glucose-Capillary 146 (H) 70 - 99 mg/dL   Comment 1 Documented in Chart    Comment 2 Notify RN   Glucose, capillary     Status: Abnormal   Collection Time: 06/17/14 10:23 AM  Result Value Ref Range   Glucose-Capillary 135 (H) 70 - 99 mg/dL   Comment 1 Documented in Chart    Comment 2 Notify RN   Glucose, capillary     Status: None   Collection Time: 06/17/14  4:40 PM  Result Value Ref Range   Glucose-Capillary 98 70 - 99 mg/dL    No results found.  ROS Blood pressure 140/68, pulse 121, temperature 101 F (38.3 C), temperature source Oral, resp. rate 18, height 5\' 9"  (1.753 m), weight 101.9 kg (224 lb 10.4 oz), SpO2 95 %. Physical Exam  Musculoskeletal:       Feet:    Assessment/Plan: Left foot swelling with cellulitis and necrosis of his 5th toe 1)  I have spoken to him in length as well as to his wife at the bedside.  She looked at his foot with me and understands that he will not heal his 5th toe.  He does need surgery for at minimum, a 5th ray resection.  Likely, he will need a 4th toe amputation as well.  I plan on taking him to surgery hopefully this Sunday am.  They understand that this is an attempt to salvage his foot and that further surgery may be needed in the future as his foot demarginates.  Logan [name] 06/17/2014, 7:25 PM

## 2014-06-17 NOTE — Transfer of Care (Signed)
Immediate Anesthesia Transfer of Care Note  Patient: Logan [name]  Procedure(s) Performed: Procedure(s):  LEFT AXILLA- LEFT FEMORAL ARTERTY BYPASS GRAFT (Left)  Patient Location: PACU  Anesthesia Type:General  Level of Consciousness: awake, alert , oriented and patient cooperative  Airway & Oxygen Therapy: Patient Spontanous Breathing and Patient connected to face mask oxygen  Post-op Assessment: Report given to RN, Post -op Vital signs reviewed and stable and Patient moving all extremities  Post vital signs: Reviewed and stable  Last Vitals:  Filed Vitals:   06/17/14 0549  BP: 159/70  Pulse: 107  Temp: 37.3 C  Resp: 18    Complications: No apparent anesthesia complications

## 2014-06-17 NOTE — Progress Notes (Signed)
Inpatient Diabetes Program Recommendations  AACE/ADA: New Consensus Statement on Inpatient Glycemic Control (2013)  Target Ranges:  Prepandial:   less than 140 mg/dL      Peak postprandial:   less than 180 mg/dL (1-2 hours)      Critically ill patients:  140 - 180 mg/dL     Results for MONTREZ, Logan [name] (MRN [id number]) as of 06/17/2014 13:47  Ref. Range 06/17/2014 05:58 06/17/2014 10:23  Glucose-Capillary Latest Range: 70-99 mg/dL 146 (H) 135 (H)     Admit for Femoral Bypass surgery with Vascular.  History of DM, PVD, PAD, HTN, CAD.   Home DM Meds: Lantus 50 units bid       Humalog 10 units tidwc       Amaryl 4 mg bid       Metformin 1000 mg bid   Current Insulin Orders: Lantus 50 units bid                 Novolog Moderate SSI                 Amaryl 4 mg bid                 Metformin 1000 mg bid    **Note patient to start Lantus 50 units bid tonight at bedtime.  Patient to also start home oral DM medications along with Novolog Moderate SSI.  Agree.  **Note A1c pending.    Will follow Wyn Quaker RN, MSN, CDE Diabetes Coordinator Inpatient Diabetes Program Team Pager: [phone] (8a-10p)

## 2014-06-17 NOTE — Anesthesia Preprocedure Evaluation (Signed)
Anesthesia Evaluation  Patient identified by MRN, date of birth, ID band Patient awake    Reviewed: Allergy & Precautions, NPO status , Patient's Chart, lab work & pertinent test results  Airway Mallampati: II   Neck ROM: full    Dental   Pulmonary shortness of breath, Current Smoker,          Cardiovascular hypertension, + angina + CAD, + Past MI, + Cardiac Stents and + Peripheral Vascular Disease  cardiolite 08/2006 inferior scar no ischemia   Neuro/Psych    GI/Hepatic   Endo/Other  diabetes, Type 2  Renal/GU      Musculoskeletal  (+) Arthritis -,   Abdominal   Peds  Hematology   Anesthesia Other Findings   Reproductive/Obstetrics                             Anesthesia Physical Anesthesia Plan  ASA: III  Anesthesia Plan: General   Post-op Pain Management:    Induction: Intravenous  Airway Management Planned: Oral ETT  Additional Equipment:   Intra-op Plan:   Post-operative Plan: Extubation in OR  Informed Consent: I have reviewed the patients History and Physical, chart, labs and discussed the procedure including the risks, benefits and alternatives for the proposed anesthesia with the patient or authorized representative who has indicated his/her understanding and acceptance.     Plan Discussed with: CRNA, Anesthesiologist and Surgeon  Anesthesia Plan Comments:         Anesthesia Quick Evaluation

## 2014-06-17 NOTE — Progress Notes (Signed)
ANTIBIOTIC CONSULT NOTE - INITIAL  Pharmacy Consult for vancomycin Indication: LE infection  Allergies  Allergen Reactions  . Aspirin Hives    Tolerates low dose aspirin  . Penicillins Rash and Other (See Comments)    Immune system shut down     Patient Measurements: Height: 5\' 9"  (175.3 cm) Weight: 224 lb 10.4 oz (101.9 kg) IBW/kg (Calculated) : 70.7 Vital Signs: Temp: 98.1 F (36.7 C) (01/29 1230) Temp Source: Oral (01/29 0549) BP: 158/74 mmHg (01/29 1323) Pulse Rate: 110 (01/29 1323) Intake/Output from this shift: Total I/O In: 1500 [I.V.:1500] Out: 430 [Urine:330; Blood:100]  Labs: No results for input(s): WBC, HGB, PLT, LABCREA, CREATININE in the last 72 hours. Estimated Creatinine Clearance: 65.8 mL/min (by C-G formula based on Cr of 1.23). No results for input(s): VANCOTROUGH, VANCOPEAK, VANCORANDOM, GENTTROUGH, GENTPEAK, GENTRANDOM, TOBRATROUGH, TOBRAPEAK, TOBRARND, AMIKACINPEAK, AMIKACINTROU, AMIKACIN in the last 72 hours.   Microbiology: Recent Results (from the past 720 hour(s))  Surgical pcr screen     Status: None   Collection Time: 06/10/14 10:49 AM  Result Value Ref Range Status   MRSA, PCR NEGATIVE NEGATIVE Final   Staphylococcus aureus NEGATIVE NEGATIVE Final    Comment:        The Xpert SA Assay (FDA approved for NASAL specimens in patients over 21 years of age), is one component of a comprehensive surveillance program.  Test performance has been validated by [facility] for patients greater than or equal to 33 year old. It is not intended to diagnose infection nor to guide or monitor treatment.     Medical History: Past Medical History  Diagnosis Date  . Overweight(278.02)   . Dyslipidemia   . Cardiomyopathy   . PVD (peripheral vascular disease)     right to left fem -fem bypass 1997/ aortabifemoral bypass 2005 right to left fem-fem 07/2004. Secondary to occluded righ tlimb of aortobifemoral bypass  . Tobacco abuse   . Anginal pain    . Hypertension   . Shortness of breath   . Coronary artery disease     taxis DES stent circumflex 02/2003.... residual total distal circumflex and 50% LAD/ cardiolite 08/2006 inferior scar no ischemia  . Degenerative disc disease, lumbar   . Coronary artery disease   . Myocardial infarction   . Diabetes mellitus     type 2  . History of blood transfusion   . Cataracts, bilateral    Medications:  Anti-infectives    Start     Dose/Rate Route Frequency Ordered Stop   06/16/14 1415  vancomycin (VANCOCIN) IVPB 1000 mg/200 mL premix     1,000 mg200 mL/hr over 60 Minutes Intravenous 60 min pre-op 06/16/14 1415 06/17/14 0831     Assessment: 69 year old male with PVD and left foot ulcer to start vancomycin per pharmacy dosing. Patient received 1g dose at 0730 AM pre-operatively s/p L-axial to L-femoral artery bypass.   No cbc available this admission. WBC on 1/22 was 11.1. Afebrile. SCr 1.23 on 1/22 with estimated CrCl ~ 65 mL/min.   Goal of Therapy:  Vancomycin trough level 15-20 mcg/ml until osteo ruled out  Plan:  Vancomycin 750 mg IV every 12 hours.  Monitor renal function, clinical status, and culture results.   Sloan Leiter, PharmD, BCPS Clinical Pharmacist [phone] 06/17/2014,2:03 PM

## 2014-06-18 LAB — CBC
HCT: 32 % — ABNORMAL LOW (ref 39.0–52.0)
Hemoglobin: 10.4 g/dL — ABNORMAL LOW (ref 13.0–17.0)
MCH: 28.9 pg (ref 26.0–34.0)
MCHC: 32.5 g/dL (ref 30.0–36.0)
MCV: 88.9 fL (ref 78.0–100.0)
Platelets: 125 10*3/uL — ABNORMAL LOW (ref 150–400)
RBC: 3.6 MIL/uL — ABNORMAL LOW (ref 4.22–5.81)
RDW: 14.1 % (ref 11.5–15.5)
WBC: 12.3 10*3/uL — ABNORMAL HIGH (ref 4.0–10.5)

## 2014-06-18 LAB — GLUCOSE, CAPILLARY
Glucose-Capillary: 54 mg/dL — ABNORMAL LOW (ref 70–99)
Glucose-Capillary: 115 mg/dL — ABNORMAL HIGH (ref 70–99)
Glucose-Capillary: 56 mg/dL — ABNORMAL LOW (ref 70–99)
Glucose-Capillary: 73 mg/dL (ref 70–99)
Glucose-Capillary: 123 mg/dL — ABNORMAL HIGH (ref 70–99)

## 2014-06-18 LAB — BASIC METABOLIC PANEL
ANION GAP: 8 (ref 5–15)
BUN: 9 mg/dL (ref 6–23)
CALCIUM: 8.2 mg/dL — AB (ref 8.4–10.5)
CHLORIDE: 105 mmol/L (ref 96–112)
CO2: 22 mmol/L (ref 19–32)
CREATININE: 1.19 mg/dL (ref 0.50–1.35)
GFR calc Af Amer: 70 mL/min — ABNORMAL LOW (ref >90)
GFR calc non Af Amer: 60 mL/min — ABNORMAL LOW (ref >90)
GLUCOSE: 48 mg/dL — AB (ref 70–99)
Potassium: 3.8 mmol/L (ref 3.5–5.1)
Sodium: 135 mmol/L (ref 135–145)

## 2014-06-18 LAB — HEMOGLOBIN A1C
HEMOGLOBIN A1C: 10 % — AB (ref 4.8–5.6)
Mean Plasma Glucose: 240 mg/dL

## 2014-06-18 MED ORDER — DEXTROSE-NACL 5-0.45 % IV SOLN
INTRAVENOUS | Status: DC
  Administered 2014-06-18: 19:00:00 via INTRAVENOUS

## 2014-06-18 NOTE — Progress Notes (Signed)
    Subjective  - POD #1, status post left axillary bifemoral bypass graft  Patient is complaining of appropriate pain at his incisions.  He also complains of pain in his toe.   Physical Exam:  Palpable graft pulse in the axillary femoral portion. Incisions are healing nicely.  There is slight fullness in the infraclavicular incision.  Groin dressings are dry   Excellent Doppler signals in the dorsalis pedis and posterior tibial artery    Assessment/Plan:  POD #1  Continue routine postoperative care. Plan is for amputation tomorrow by Dr. Chipper Herb keep in stepdown overnight  Jalesia Loudenslager IV, V. WELLS 06/18/2014 10:19 AM --  Danley Danker Vitals:   06/18/14 0900  BP: 127/58  Pulse: 127  Temp: 99.6 F (37.6 C)  Resp: 15    Intake/Output Summary (Last 24 hours) at 06/18/14 1019 Last data filed at 06/18/14 0900  Gross per 24 hour  Intake 379.17 ml  Output   2200 ml  Net -1820.83 ml     Laboratory CBC    Component Value Date/Time   WBC 12.3* 06/18/2014 0240   HGB 10.4* 06/18/2014 0240   HCT 32.0* 06/18/2014 0240   PLT 125* 06/18/2014 0240    BMET    Component Value Date/Time   NA 135 06/18/2014 0240   K 3.8 06/18/2014 0240   CL 105 06/18/2014 0240   CO2 22 06/18/2014 0240   GLUCOSE 48* 06/18/2014 0240   BUN 9 06/18/2014 0240   CREATININE 1.19 06/18/2014 0240   CREATININE 1.12 08/12/2012 1227   CALCIUM 8.2* 06/18/2014 0240   GFRNONAA 60* 06/18/2014 0240   GFRNONAA 67 08/12/2012 1227   GFRAA 70* 06/18/2014 0240   GFRAA 78 08/12/2012 1227    COAG Lab Results  Component Value Date   INR 1.00 06/10/2014   INR 1.18 05/27/2012   INR 1.22 05/26/2012   No results found for: PTT  Antibiotics Anti-infectives    Start     Dose/Rate Route Frequency Ordered Stop   06/17/14 1900  vancomycin (VANCOCIN) IVPB 750 mg/150 ml premix     750 mg150 mL/hr over 60 Minutes Intravenous Every 12 hours 06/17/14 1410     06/16/14 1415  vancomycin (VANCOCIN) IVPB 1000  mg/200 mL premix     1,000 mg200 mL/hr over 60 Minutes Intravenous 60 min pre-op 06/16/14 1415 06/17/14 0831       V. Leia Alf, M.D. [facility] [place] Office: [phone] Pager:  [phone]

## 2014-06-18 NOTE — Evaluation (Signed)
Physical Therapy Evaluation Patient Details Name: Logan [name] MRN: [id number] DOB: 11-17-1945 Today's Date: 06/18/2014   History of Present Illness  Logan [name] is a 69 y.o. male  patient of Dr. Kellie Simmering had an aortobifemoral bypass graft with occlusion of the right limb. He also has a left-to-right femoral-femoral bypass graft on 12/20/11. Pt now s/p aortobifem bPG and left fem-pop with planned left toe amputations 1/31  Clinical Impression  Pt with rather flat affect and limited interaction with therapist. Pt does not demonstrate significant desire to increase mobility at this time. Pt with painful left foot with education for transfers and mobility. Pt with limited function at baseline per pt and spouse and anticipate he will need SNF at D/C. Pt will benefit from acute therapy to maximize mobility, function, ROM and strength prior to D/C to decrease burden of care. Pt encouraged to assist with mobility and attempt increased bil LE movement throughout the day.     Follow Up Recommendations SNF;Supervision/Assistance - 24 hour    Equipment Recommendations  Wheelchair (measurements PT)    Recommendations for Other Services OT consult     Precautions / Restrictions Precautions Precautions: Fall      Mobility  Bed Mobility Overal bed mobility: Needs Assistance Bed Mobility: Supine to Sit;Sit to Supine     Supine to sit: Supervision;HOB elevated Sit to supine: Supervision   General bed mobility comments: cues for sequence with increased time and pt assisting LLE with movement onto and off of bed. Mod assist with bed in trendelenburg to scoot to [facility]. Pt denied attempting standing, OOB or any further mobility. EOB grossly 7 min  Transfers                    Ambulation/Gait                Stairs            Wheelchair Mobility    Modified Rankin (Stroke Patients Only)       Balance Overall balance assessment: Needs assistance   Sitting balance-Leahy  Scale: Fair                                       Pertinent Vitals/Pain Pain Assessment: 0-10 Pain Score: 8  Pain Location: left foot Pain Descriptors / Indicators: Aching Pain Intervention(s): Limited activity within patient's tolerance;Premedicated before session;Repositioned  HR 125 sats 95% on RA    Home Living Family/patient expects to be discharged to:: Private residence Living Arrangements: Spouse/significant other Available Help at Discharge: Family;Available PRN/intermittently Type of Home: House Home Access: Stairs to enter   Entrance Stairs-Number of Steps: 3 Home Layout: One level Home Equipment: Walker - 2 wheels;Cane - single point;Bedside commode      Prior Function Level of Independence: Independent with assistive device(s)         Comments: pt with limited ambulation at home and spouse reports he was still managing with ADLs but family doing all housework     Hand Dominance        Extremity/Trunk Assessment   Upper Extremity Assessment: Overall WFL for tasks assessed           Lower Extremity Assessment: Generalized weakness;LLE deficits/detail      Cervical / Trunk Assessment: Normal  Communication   Communication: No difficulties  Cognition Arousal/Alertness: Awake/alert Behavior During Therapy: Flat affect Overall Cognitive Status: Within Functional Limits for  tasks assessed                      General Comments      Exercises General Exercises - Lower Extremity Long Arc Quad: AROM;Seated;Both;10 reps      Assessment/Plan    PT Assessment    PT Diagnosis Generalized weakness;Acute pain;Difficulty walking   PT Problem List    PT Treatment Interventions     PT Goals (Current goals can be found in the Care Plan section) Acute Rehab PT Goals Patient Stated Goal: be able to return home PT Goal Formulation: With patient/family Time For Goal Achievement: 07/02/14 Potential to Achieve Goals: Fair     Frequency     Barriers to discharge        Co-evaluation               End of Session   Activity Tolerance: Patient limited by pain Patient left: in bed;with call bell/phone within [name];with family/visitor present Nurse Communication: Mobility status         Time: 1130-1156 PT Time Calculation (min) (ACUTE ONLY): 26 min   Charges:   PT Evaluation $Initial PT Evaluation Tier I: 1 Procedure PT Treatments $Therapeutic Activity: 8-22 mins   PT G CodesMelford [name] 06/18/2014, 12:03 PM Logan [name], [place]

## 2014-06-18 NOTE — Progress Notes (Signed)
Paged Dr. Trula Slade regarding pts CBGs in the 50's despite not receiving any of his diabetes medications today or last night. Awaiting call back.

## 2014-06-18 NOTE — Progress Notes (Signed)
Patient ID: Logan [name], male   DOB: [birth year], 70 y.o.   MRN: [id number] I have talked again to Mr. Liendo and his family at the bedside in length.  It is fully understand that we will proceed to the OR tomorrow 1/31 for a Left foot I&D with at minimum a 5th ray resection and possibly a 4th ray resection pending intraoperative findings.  He will be NPO after midnight tonight.

## 2014-06-18 NOTE — Progress Notes (Signed)
Spoke with vascular this AM to see if we could leave in pts foley since he is going to OR in the AM. They said it was ok to leave in.

## 2014-06-18 NOTE — Progress Notes (Signed)
Received return call from Dr. Trula Slade. New orders received.

## 2014-06-19 ENCOUNTER — Inpatient Hospital Stay (HOSPITAL_COMMUNITY): Payer: Medicare Other | Admitting: Anesthesiology

## 2014-06-19 ENCOUNTER — Encounter (HOSPITAL_COMMUNITY)
Admission: RE | Disposition: A | Discharge status: Skilled Nursing Facility | Payer: Self-pay | Source: Ambulatory Visit | Attending: Vascular Surgery

## 2014-06-19 HISTORY — PX: AMPUTATION: SHX166

## 2014-06-19 LAB — GLUCOSE, CAPILLARY
GLUCOSE-CAPILLARY: 205 mg/dL — AB (ref 70–99)
Glucose-Capillary: 58 mg/dL — ABNORMAL LOW (ref 70–99)
Glucose-Capillary: 98 mg/dL (ref 70–99)
Glucose-Capillary: 286 mg/dL — ABNORMAL HIGH (ref 70–99)

## 2014-06-19 LAB — POCT I-STAT GLUCOSE
Glucose, Bld: 153 mg/dL — ABNORMAL HIGH (ref 70–99)
Operator id: 117071

## 2014-06-19 SURGERY — AMPUTATION, FOOT, RAY
Anesthesia: General | Site: Foot | Laterality: Left

## 2014-06-19 MED ORDER — SODIUM CHLORIDE 0.9 % IV SOLN
INTRAVENOUS | Status: DC | PRN
  Administered 2014-06-19: 08:00:00 via INTRAVENOUS

## 2014-06-19 MED ORDER — DEXTROSE 50 % IV SOLN
INTRAVENOUS | Status: AC
  Administered 2014-06-19: 25 mL
  Filled 2014-06-19: qty 50

## 2014-06-19 MED ORDER — ESMOLOL HCL 10 MG/ML IV SOLN
INTRAVENOUS | Status: AC
  Filled 2014-06-19: qty 10

## 2014-06-19 MED ORDER — LIDOCAINE HCL (CARDIAC) 20 MG/ML IV SOLN
INTRAVENOUS | Status: DC | PRN
  Administered 2014-06-19: 60 mg via INTRAVENOUS

## 2014-06-19 MED ORDER — CLINDAMYCIN PHOSPHATE 900 MG/50ML IV SOLN
900.0000 mg | Freq: Once | INTRAVENOUS | Status: DC
  Filled 2014-06-19: qty 50

## 2014-06-19 MED ORDER — PROPOFOL 10 MG/ML IV BOLUS
INTRAVENOUS | Status: DC | PRN
  Administered 2014-06-19: 200 mg via INTRAVENOUS

## 2014-06-19 MED ORDER — ENOXAPARIN SODIUM 60 MG/0.6ML ~~LOC~~ SOLN
50.0000 mg | SUBCUTANEOUS | Status: DC
  Administered 2014-06-20 – 2014-06-21 (×2): 50 mg via SUBCUTANEOUS
  Filled 2014-06-19 (×2): qty 0.6

## 2014-06-19 MED ORDER — PROPOFOL 10 MG/ML IV BOLUS
INTRAVENOUS | Status: AC
  Filled 2014-06-19: qty 20

## 2014-06-19 MED ORDER — MIDAZOLAM HCL 2 MG/2ML IJ SOLN
INTRAMUSCULAR | Status: AC
  Filled 2014-06-19: qty 2

## 2014-06-19 MED ORDER — ROCURONIUM BROMIDE 50 MG/5ML IV SOLN
INTRAVENOUS | Status: AC
  Filled 2014-06-19: qty 1

## 2014-06-19 MED ORDER — ESMOLOL HCL 10 MG/ML IV SOLN: INTRAVENOUS | Status: DC | PRN

## 2014-06-19 MED ORDER — HYDROMORPHONE HCL 1 MG/ML IJ SOLN
0.5000 mg | INTRAMUSCULAR | Status: DC | PRN
  Administered 2014-06-19 (×2): 0.5 mg via INTRAVENOUS

## 2014-06-19 MED ORDER — PHENYLEPHRINE 40 MCG/ML (10ML) SYRINGE FOR IV PUSH (FOR BLOOD PRESSURE SUPPORT)
PREFILLED_SYRINGE | INTRAVENOUS | Status: AC
  Filled 2014-06-19: qty 10

## 2014-06-19 MED ORDER — HYDROMORPHONE HCL 1 MG/ML IJ SOLN
INTRAMUSCULAR | Status: AC
  Administered 2014-06-19: 0.5 mg via INTRAVENOUS
  Filled 2014-06-19: qty 1

## 2014-06-19 MED ORDER — ESMOLOL HCL 10 MG/ML IV SOLN
INTRAVENOUS | Status: DC | PRN
  Administered 2014-06-19 (×2): 30 mg via INTRAVENOUS
  Administered 2014-06-19: 10 mg via INTRAVENOUS

## 2014-06-19 MED ORDER — ONDANSETRON HCL 4 MG/2ML IJ SOLN
INTRAMUSCULAR | Status: AC
  Filled 2014-06-19: qty 2

## 2014-06-19 MED ORDER — PHENYLEPHRINE HCL 10 MG/ML IJ SOLN
INTRAMUSCULAR | Status: DC | PRN
  Administered 2014-06-19: 80 ug via INTRAVENOUS

## 2014-06-19 MED ORDER — FENTANYL CITRATE 0.05 MG/ML IJ SOLN
INTRAMUSCULAR | Status: DC | PRN
  Administered 2014-06-19 (×2): 50 ug via INTRAVENOUS
  Administered 2014-06-19: 100 ug via INTRAVENOUS
  Administered 2014-06-19: 50 ug via INTRAVENOUS

## 2014-06-19 MED ORDER — HYDROMORPHONE HCL 1 MG/ML IJ SOLN
0.5000 mg | INTRAMUSCULAR | Status: DC | PRN
Start: 2014-06-19 — End: 2014-06-19
  Administered 2014-06-19 (×4): 0.5 mg via INTRAVENOUS

## 2014-06-19 MED ORDER — ONDANSETRON HCL 4 MG/2ML IJ SOLN
INTRAMUSCULAR | Status: DC | PRN
  Administered 2014-06-19: 4 mg via INTRAVENOUS

## 2014-06-19 MED ORDER — FENTANYL CITRATE 0.05 MG/ML IJ SOLN
INTRAMUSCULAR | Status: AC
  Filled 2014-06-19: qty 5

## 2014-06-19 MED ORDER — HYDROMORPHONE HCL 1 MG/ML IJ SOLN
0.4000 mg | INTRAMUSCULAR | Status: DC | PRN
  Administered 2014-06-19 – 2014-06-20 (×7): 0.4 mg via INTRAVENOUS
  Filled 2014-06-19 (×7): qty 1

## 2014-06-19 MED ORDER — LIDOCAINE HCL (CARDIAC) 20 MG/ML IV SOLN
INTRAVENOUS | Status: AC
  Filled 2014-06-19: qty 5

## 2014-06-19 MED ORDER — 0.9 % SODIUM CHLORIDE (POUR BTL) OPTIME
TOPICAL | Status: DC | PRN
  Administered 2014-06-19: 1000 mL

## 2014-06-19 SURGICAL SUPPLY — 42 items
BANDAGE ESMARK 6X9 LF (GAUZE/BANDAGES/DRESSINGS) IMPLANT
BLADE SURG 10 STRL SS (BLADE) ×3 IMPLANT
BNDG CMPR 9X6 STRL LF SNTH (GAUZE/BANDAGES/DRESSINGS)
BNDG COHESIVE 4X5 TAN STRL (GAUZE/BANDAGES/DRESSINGS) ×3 IMPLANT
BNDG ESMARK 6X9 LF (GAUZE/BANDAGES/DRESSINGS)
BNDG GAUZE ELAST 4 BULKY (GAUZE/BANDAGES/DRESSINGS) ×2 IMPLANT
CUFF TOURNIQUET SINGLE 34IN LL (TOURNIQUET CUFF) IMPLANT
CUFF TOURNIQUET SINGLE 44IN (TOURNIQUET CUFF) IMPLANT
DRAPE U-SHAPE 47X51 STRL (DRAPES) ×6 IMPLANT
DRSG PAD ABDOMINAL 8X10 ST (GAUZE/BANDAGES/DRESSINGS) ×2 IMPLANT
DURAPREP 26ML APPLICATOR (WOUND CARE) ×3 IMPLANT
ELECT REM PT RETURN 9FT ADLT (ELECTROSURGICAL) ×3
ELECTRODE REM PT RTRN 9FT ADLT (ELECTROSURGICAL) ×1 IMPLANT
GAUZE SPONGE 4X4 12PLY STRL (GAUZE/BANDAGES/DRESSINGS) ×3 IMPLANT
GAUZE XEROFORM 5X9 LF (GAUZE/BANDAGES/DRESSINGS) ×3 IMPLANT
GLOVE BIO SURGEON STRL SZ8 (GLOVE) ×3 IMPLANT
GLOVE BIOGEL PI IND STRL 8 (GLOVE) ×1 IMPLANT
GLOVE BIOGEL PI INDICATOR 8 (GLOVE) ×2
GLOVE ORTHO TXT STRL SZ7.5 (GLOVE) ×3 IMPLANT
GOWN STRL REUS W/ TWL LRG LVL3 (GOWN DISPOSABLE) ×1 IMPLANT
GOWN STRL REUS W/ TWL XL LVL3 (GOWN DISPOSABLE) ×4 IMPLANT
GOWN STRL REUS W/TWL LRG LVL3 (GOWN DISPOSABLE) ×3
GOWN STRL REUS W/TWL XL LVL3 (GOWN DISPOSABLE) ×12
KIT BASIN OR (CUSTOM PROCEDURE TRAY) ×3 IMPLANT
KIT ROOM TURNOVER OR (KITS) ×3 IMPLANT
NS IRRIG 1000ML POUR BTL (IV SOLUTION) ×3 IMPLANT
PACK ORTHO EXTREMITY (CUSTOM PROCEDURE TRAY) ×3 IMPLANT
PAD ARMBOARD 7.5X6 YLW CONV (MISCELLANEOUS) ×6 IMPLANT
PAD CAST 4YDX4 CTTN HI CHSV (CAST SUPPLIES) ×1 IMPLANT
PADDING CAST COTTON 4X4 STRL (CAST SUPPLIES) ×3
SPONGE GAUZE 4X4 12PLY STER LF (GAUZE/BANDAGES/DRESSINGS) ×2 IMPLANT
SPONGE LAP 18X18 X RAY DECT (DISPOSABLE) ×6 IMPLANT
STAPLER VISISTAT 35W (STAPLE) ×3 IMPLANT
STOCKINETTE IMPERVIOUS LG (DRAPES) IMPLANT
SUT ETHILON 2 0 PSLX (SUTURE) ×6 IMPLANT
SUT ETHILON 3 0 PS 1 (SUTURE) ×2 IMPLANT
TOWEL OR 17X24 6PK STRL BLUE (TOWEL DISPOSABLE) ×3 IMPLANT
TOWEL OR 17X26 10 PK STRL BLUE (TOWEL DISPOSABLE) ×3 IMPLANT
TUBE CONNECTING 12'X1/4 (SUCTIONS) ×1
TUBE CONNECTING 12X1/4 (SUCTIONS) ×2 IMPLANT
WATER STERILE IRR 1000ML POUR (IV SOLUTION) ×3 IMPLANT
YANKAUER SUCT BULB TIP NO VENT (SUCTIONS) ×3 IMPLANT

## 2014-06-19 NOTE — Progress Notes (Addendum)
   [facility] [place]  Subjective  - Patient in surgery with Dr. Ninfa Linden for Left foot I&D with at minimum a 5th ray resection and possibly a 4th ray resection pending intraoperative findings.   He has had hypoglycemia per nursing glucose was reported at 50, 48, and 54.  We are D/C ing Amaryl and metformin for now.  A/P S/PLeft axial load to profunda femoris bypass using 8 mm Hemashield Dacron graft In OR currently  COLLINS, EMMA MAUREEN PA-C   I agree with the above.  The patient had issues with hypoglycemia overnight which is being addressed.  He is scheduled for toe amputation today by Dr. Ninfa Linden.  Annamarie Major

## 2014-06-19 NOTE — Progress Notes (Signed)
Pt to OR via bed.  CBG 54 pre op, 25 ml D50 given per protocol.

## 2014-06-19 NOTE — Progress Notes (Signed)
Morphine not working for pts pain. Notified Dr. Trula Slade and received new orders. Will continue to monitor pt.

## 2014-06-19 NOTE — Progress Notes (Signed)
Utilization review completed.  

## 2014-06-19 NOTE — Transfer of Care (Signed)
Immediate Anesthesia Transfer of Care Note  Patient: Logan [name]  Procedure(s) Performed: Procedure(s): Fifth toe amputaion, MPT Joint (Left)  Patient Location: PACU  Anesthesia Type:General  Level of Consciousness: awake  Airway & Oxygen Therapy: Patient Spontanous Breathing and Patient connected to nasal cannula oxygen  Post-op Assessment: Report given to RN and Post -op Vital signs reviewed and stable  Post vital signs: Reviewed and stable  Last Vitals:  Filed Vitals:   06/19/14 0521  BP: 127/55  Pulse: 115  Temp: 37.8 C  Resp: 17    Complications: No apparent anesthesia complications

## 2014-06-19 NOTE — Anesthesia Preprocedure Evaluation (Addendum)
Anesthesia Evaluation  Patient identified by MRN, date of birth, ID band Patient awake    Reviewed: Allergy & Precautions, NPO status , Patient's Chart, lab work & pertinent test results  Airway Mallampati: III  TM Distance: >3 FB Neck ROM: Full    Dental  (+) Edentulous Upper, Edentulous Lower, Dental Advisory Given   Pulmonary Current Smoker,          Cardiovascular hypertension, Pt. on medications + CAD, + Past MI, + Cardiac Stents and + Peripheral Vascular Disease     Neuro/Psych    GI/Hepatic negative GI ROS, Neg liver ROS,   Endo/Other  diabetes  Renal/GU      Musculoskeletal   Abdominal   Peds  Hematology   Anesthesia Other Findings   Reproductive/Obstetrics                           Anesthesia Physical Anesthesia Plan  ASA: III  Anesthesia Plan: General   Post-op Pain Management:    Induction: Intravenous  Airway Management Planned: LMA  Additional Equipment:   Intra-op Plan:   Post-operative Plan: Extubation in OR  Informed Consent: I have reviewed the patients History and Physical, chart, labs and discussed the procedure including the risks, benefits and alternatives for the proposed anesthesia with the patient or authorized representative who has indicated his/her understanding and acceptance.   Dental advisory given  Plan Discussed with: CRNA and Anesthesiologist  Anesthesia Plan Comments:         Anesthesia Quick Evaluation

## 2014-06-19 NOTE — Brief Op Note (Signed)
06/17/2014 - 06/19/2014  8:25 AM  PATIENT:  Logan [name]  69 y.o. male  PRE-OPERATIVE DIAGNOSIS:  Infection, PVD, necrosis of left foot 5th toe  POST-OPERATIVE DIAGNOSIS:  same  PROCEDURE:  Procedure(s): Fifth toe amputaion, MPT Joint (Left)  SURGEON:  Surgeon(s) and Role:    * Mcarthur Rossetti, MD - Primary  ANESTHESIA:   general  EBL:  Total I/O In: 400 [I.V.:400] Out: -   BLOOD ADMINISTERED:none  DRAINS: none   LOCAL MEDICATIONS USED:  NONE  SPECIMEN:  Excision  DISPOSITION OF SPECIMEN:  PATHOLOGY  DICTATION: .Other Dictation: Dictation Number [id number]  PLAN OF CARE: Admit to inpatient   PATIENT DISPOSITION:  PACU - hemodynamically stable.   Delay start of Pharmacological VTE agent (>24hrs) due to surgical blood loss or risk of bleeding: no

## 2014-06-19 NOTE — Anesthesia Procedure Notes (Signed)
Procedure Name: LMA Insertion Date/Time: 06/19/2014 7:49 AM Performed by: Marinda Elk A Pre-anesthesia Checklist: Patient identified, Emergency Drugs available, Suction available, Patient being monitored and Timeout performed Patient Re-evaluated:Patient Re-evaluated prior to inductionOxygen Delivery Method: Circle system utilized Preoxygenation: Pre-oxygenation with 100% oxygen Intubation Type: IV induction Ventilation: Mask ventilation without difficulty LMA: LMA inserted LMA Size: 5.0 Number of attempts: 1 Tube secured with: Tape Dental Injury: Teeth and Oropharynx as per pre-operative assessment

## 2014-06-19 NOTE — Op Note (Signed)
NAME:  Logan [name], SALZWEDEL NO.:  [id number]  MEDICAL RECORD NO.:  [id number]  LOCATION:  3S09C                        FACILITY:  [facility]  PHYSICIAN:  Lind Guest. Ninfa Linden, M.D.DATE OF BIRTH:  01-23-1946  DATE OF PROCEDURE:  06/19/2014 DATE OF DISCHARGE:                              OPERATIVE REPORT   PREOPERATIVE DIAGNOSIS:  Peripheral vascular disease status post revascularization of the left lower extremity with left fifth toe necrosis and infection.  POSTOPERATIVE DIAGNOSIS:  Peripheral vascular disease status post revascularization of the left lower extremity with left fifth toe necrosis and infection.  PROCEDURE:  Left foot fifth toe amputation to the MTP joint.  SURGEON:  Lind Guest. Ninfa Linden, MD  ANESTHESIA:  General.  BLOOD LOSS:  Less than 50 mL.  COMPLICATIONS:  None.  INDICATIONS:  Mr. Delker, 69 year old gentleman with diabetes and peripheral vascular disease who this past Friday, underwent a left lower extremity revascularization by Dr. Kellie Simmering.  Orthopedics was asked to address his left foot.  He has necrotic and malodorous left fifth toe. There is some skin changes on his foot and cellulitis suggesting that he may be developing some ischemic changes in the fourth toe.  I talked to the patient's family in length about it, minimal of fifth toe amputation due to the necrosis of it and then assessing the foot in general to see if any further surgery is warranted.  They did agree to proceed with this given the state of his left foot.  PROCEDURE DESCRIPTION:  After informed consent was obtained, appropriate left foot was marked.  He was brought to the operating room, placed supine on the operating table.  General anesthesia was then obtained. His left foot was prepped and draped with DuraPrep and sterile drapes. A time-out was called.  He identified as correct patient, correct left foot.  I then able was to easily make an incision around the  fifth toe and remove it in its entirety through the MTP joint.  It was necrotic at the end and had obvious infection.  I did not use any type of tourniquet and I was pleased to see bleeding in the soft tissues around this.  I was able to sharply excise with a knife necrotic tissue around this. The fourth toe had good perfusion to it and basically just some necrotic- appearing skin dorsally which was able to remove with just forceps easily that made the determination then not to remove any further parts of his foot to see if he still has a potential for healing given his revascularization.  I did make a small incision on the dorsal foot where he was red and found just abundant edema and I irrigated this out just to make sure there was no infection or purulence and there was none, so that was absolutely pleasing as well.  After all incisions were then closed, I placed Xeroform and then loose dressing around the foot.  He was awakened, extubated, and taken to recovery room in stable condition.  All final counts were correct. There were no complications noted.  Postoperatively, I will have him to ambulate, weightbearing as tolerated in a postoperative shoe.  We will leave  the dressing on for at least a week for protection of his foot.     Lind Guest. Ninfa Linden, M.D.     CYB/MEDQ  D:  06/19/2014  T:  06/19/2014  Job:  [id number]

## 2014-06-19 NOTE — Progress Notes (Signed)
Orthopedic Tech Progress Note Patient Details:  Logan [name] Oct 16, 1945 [id number] Patient states he already has p[ost op shoe Patient ID: Logan [name], male   DOB: [birth year], 69 y.o.   MRN: [id number]   Logan [name] 06/19/2014, 12:34 PM

## 2014-06-19 NOTE — Anesthesia Postprocedure Evaluation (Signed)
  Anesthesia Post-op Note  Patient: Logan [name]  Procedure(s) Performed: Procedure(s): Fifth toe amputaion, MPT Joint (Left)  Patient Location: PACU  Anesthesia Type:General  Level of Consciousness: awake  Airway and Oxygen Therapy: Patient Spontanous Breathing  Post-op Pain: mild  Post-op Assessment: Post-op Vital signs reviewed  Post-op Vital Signs: Reviewed  Last Vitals:  Filed Vitals:   06/19/14 0932  BP: 153/63  Pulse: 112  Temp:   Resp: 16    Complications: No apparent anesthesia complications

## 2014-06-20 ENCOUNTER — Encounter (HOSPITAL_COMMUNITY): Payer: Self-pay | Admitting: Vascular Surgery

## 2014-06-20 ENCOUNTER — Telehealth: Payer: Self-pay | Admitting: Vascular Surgery

## 2014-06-20 LAB — GLUCOSE, CAPILLARY
GLUCOSE-CAPILLARY: 202 mg/dL — AB (ref 70–99)
Glucose-Capillary: 263 mg/dL — ABNORMAL HIGH (ref 70–99)
Glucose-Capillary: 166 mg/dL — ABNORMAL HIGH (ref 70–99)
Glucose-Capillary: 150 mg/dL — ABNORMAL HIGH (ref 70–99)

## 2014-06-20 MED ORDER — INSULIN GLARGINE 100 UNIT/ML ~~LOC~~ SOLN
15.0000 [IU] | Freq: Two times a day (BID) | SUBCUTANEOUS | Status: DC
  Administered 2014-06-20 – 2014-06-21 (×3): 15 [IU] via SUBCUTANEOUS
  Filled 2014-06-20 (×4): qty 0.15

## 2014-06-20 NOTE — Discharge Instructions (Signed)
Keep left foot dressing clean and dry. Follow-up for foot with Dr. Ninfa Linden, [facility], in 1 week.

## 2014-06-20 NOTE — Progress Notes (Addendum)
  Progress Note    06/20/2014 7:33 AM 1 Day Post-Op  Subjective:  States "My foot is killing me"  Tm 101.4 now 98.7 HR 110's-120's 425'Z-563'O systolic 75% RA  Abx:  Vancomycin  Filed Vitals:   06/20/14 0355  BP: 142/62  Pulse: 110  Temp: 98.7 F (37.1 C)  Resp: 23    Physical Exam: Cardiac:  regular Lungs:  Non labored Incisions:  Left ant chest incision is c/d/i; left groin is clean with nylon sutures in tact Extremities:  Left foot is wrapped; strong palpable graft pulse   CBC    Component Value Date/Time   WBC 12.3* 06/18/2014 0240   RBC 3.60* 06/18/2014 0240   HGB 10.4* 06/18/2014 0240   HCT 32.0* 06/18/2014 0240   PLT 125* 06/18/2014 0240   MCV 88.9 06/18/2014 0240   MCH 28.9 06/18/2014 0240   MCHC 32.5 06/18/2014 0240   RDW 14.1 06/18/2014 0240   LYMPHSABS 3.3 05/04/2014 1752   MONOABS 0.7 05/04/2014 1752   EOSABS 0.2 05/04/2014 1752   BASOSABS 0.0 05/04/2014 1752    BMET    Component Value Date/Time   NA 135 06/18/2014 0240   K 3.8 06/18/2014 0240   CL 105 06/18/2014 0240   CO2 22 06/18/2014 0240   GLUCOSE 153* 06/19/2014 0759   BUN 9 06/18/2014 0240   CREATININE 1.19 06/18/2014 0240   CREATININE 1.12 08/12/2012 1227   CALCIUM 8.2* 06/18/2014 0240   GFRNONAA 60* 06/18/2014 0240   GFRNONAA 67 08/12/2012 1227   GFRAA 70* 06/18/2014 0240   GFRAA 78 08/12/2012 1227    INR    Component Value Date/Time   INR 1.00 06/10/2014 1051     Intake/Output Summary (Last 24 hours) at 06/20/14 6433 Last data filed at 06/20/14 0500  Gross per 24 hour  Intake   1835 ml  Output   4975 ml  Net  -3140 ml     Assessment:  69 y.o. male is s/p:  Left axillary to profunda femoris bypass using 8 mm Hemashield Dacron graft 3 Day Post-Op And  Left foot fifth toe amputation to the MTP joint. 1 Day Post Op   Plan: -palpable graft pulse present -per Dr. Ninfa Linden, dressing to remain in tact for ~ 1 week.  Also, he can have full weight bearing on  left foot in post op shoe.  Pt states he has one. -PT to see pt today -glucose elevated overnight-DM coordinator to see pt today and adjust meds accordingly.  Continue SS for now. -heplock IV -d/c foley catheter -mobilize pt as he has only been up to the chair -DVT prophylaxis:  Lovenox   Leontine Locket, PA-C [facility] [phone] 06/20/2014 7:33 AM    Agree with above assessment 3+ pulse in left axillary femoral bypass with nicely healing incisions Weightbearing with shoe per Dr. Osvaldo Shipper PT today  DC Foley today and plan DC home in a.m. and follow-up with me in 2 weeks and Dr. Ninfa Linden in one week

## 2014-06-20 NOTE — Progress Notes (Signed)
Foley removed per MD order. Pt tolerated well. 

## 2014-06-20 NOTE — Evaluation (Addendum)
Occupational Therapy Evaluation Patient Details Name: Logan [name] MRN: [id number] DOB: [birth year] Today's Date: 06/20/2014    History of Present Illness Logan [name] is a 69 y.o. male  patient of Dr. Kellie Simmering had an aortobifemoral bypass graft with occlusion of the right limb. He also has a left-to-right femoral-femoral bypass graft on 12/20/11. Pt now s/p aortobifem bPG, L Fem-pop, and L 5th toe amputation.     Clinical Impression   Pt s/p above. Pt independent with ADLs, PTA. Pt refusing to stand with therapy with post-op shoe. OT educated on orders and WB status, but pt refusing to still stand and does not want to wear post-op shoe. Deferring all further needs to SNF.     Follow Up Recommendations  SNF    Equipment Recommendations  Other (comment) (defer to next venue)    Recommendations for Other Services       Precautions / Restrictions Precautions Precautions: Fall Required Braces or Orthoses: Other Brace/Splint Other Brace/Splint: Post-op shoe on Lt for WBAT Restrictions Weight Bearing Restrictions: Yes LLE Weight Bearing: Weight bearing as tolerated (while in post-op shoe)      Mobility Bed Mobility Overal bed mobility: Needs Assistance Bed Mobility: Supine to Sit     Supine to sit: Supervision        Transfers                 General transfer comment: pt refusing to stand with post-op shoe    Balance                                            ADL Overall ADL's : Needs assistance/impaired     Grooming: Oral care;Sitting;Set up (also dried off hands)           Upper Body Dressing : Set up;Sitting   Lower Body Dressing: Minimal assistance;Bed level   Toilet Transfer:  (not assessed)             General ADL Comments: Educated on techniques for LB ADLs (rolling, leaning). Suggested pt to elevate Lt LE. Discussed importance of getting up-pt refusing to stand. Educated that pt is WBAT on Lt LE with post op shoe on. Pt sat  EOB for grooming.     Vision                     Perception     Praxis      Pertinent Vitals/Pain Pain Assessment: 0-10 Pain Score: 10-Worst pain ever Pain Location: left foot Pain Intervention(s): Monitored during session     Hand Dominance     Extremity/Trunk Assessment Upper Extremity Assessment Upper Extremity Assessment: LUE deficits/detail LUE Deficits / Details: limited AROM left shoulder due to incision in chest area   Lower Extremity Assessment Lower Extremity Assessment: Defer to PT evaluation       Communication Communication Communication: No difficulties   Cognition Arousal/Alertness: Awake/alert Behavior During Therapy: Flat affect;Agitated Overall Cognitive Status: Within Functional Limits for tasks assessed                     General Comments       Exercises       Shoulder Instructions      Home Living Family/patient expects to be discharged to:: Private residence Living Arrangements: Spouse/significant other Available Help at Discharge: Family;Available PRN/intermittently Type of Home: [place]  Access: Stairs to enter [employer] of Steps: 3   Home Layout: One level               Home Equipment: Walker - 2 wheels;Cane - single point;Bedside commode          Prior Functioning/Environment Level of Independence: Independent with assistive device(s)        Comments: pt with limited ambulation at home and spouse reports he was still managing with ADLs but family doing all housework    OT Diagnosis: Acute pain   OT Problem List: Decreased strength;Decreased activity tolerance;Decreased knowledge of use of DME or AE;Decreased knowledge of precautions;Pain;Decreased safety awareness; Decreased range of motion   OT Treatment/Interventions:      OT Goals(Current goals can be found in the care plan section)    OT Frequency:     Barriers to D/C:            Co-evaluation              End  of Session    Activity Tolerance: Patient limited by pain Patient left: in bed;with call bell/phone within reach;with bed alarm set;with family/visitor present  Nurse communication: mobility status, pain level, d/c plan   Time: 2706-2376 OT Time Calculation (min): 14 min Charges:  OT General Charges $OT Visit: 1 Procedure OT Evaluation $Initial OT Evaluation Tier I: 1 Procedure G-CodesBenito Mccreedy OTR/L [id number] 06/20/2014, 3:26 PM

## 2014-06-20 NOTE — Progress Notes (Signed)
Patient ID: Logan [name], male   DOB: March 10, 1946, 69 y.o.   MRN: [id number] Left foot dressing is clean, dry, and intact.  Looks good overall.  Can be up with PT and full weight on left foot in post-op shoe.  I plan to leave that dressing on for 1 week and see him in the office then.

## 2014-06-20 NOTE — Progress Notes (Signed)
Physical Therapy Treatment Patient Details Name: Logan [name] MRN: [id number] DOB: 1945-09-26 Today's Date: 06/20/2014    History of Present Illness Logan [name] is a 69 y.o. male  patient of Dr. Kellie Simmering had an aortobifemoral bypass graft with occlusion of the right limb. He also has a left-to-right femoral-femoral bypass graft on 12/20/11. Pt now s/p aortobifem bPG, L Fem-pop, and L 5th toe amputation.      PT Comments    Pt needs gentle encouragement for participation and declined attempting to stand as pt states MD didn't tell him that he could stand.  Attempted to educate pt on MD orders for mobility and use of post-op shoe, however pt continues to decline mobility.  At this point continue to feel pt will need SNF level of care at D/C.    Follow Up Recommendations  SNF     Equipment Recommendations  Wheelchair (measurements PT)    Recommendations for Other Services OT consult     Precautions / Restrictions Precautions Precautions: Fall Required Braces or Orthoses: Other Brace/Splint Other Brace/Splint: Post-op shoe on L for WBAT Restrictions Weight Bearing Restrictions: Yes LLE Weight Bearing: Weight bearing as tolerated (while in post-op shoe)    Mobility  Bed Mobility Overal bed mobility: Needs Assistance Bed Mobility: Supine to Sit;Sit to Supine     Supine to sit: Supervision;HOB elevated Sit to supine: Supervision   General bed mobility comments: pt needs increased time and HOB elevated, but is able to complete without physical A.  pt indicates pain is worse once placing L LE in dependent position.    Transfers                 General transfer comment: pt declined to attempt  Ambulation/Gait                 Stairs            Wheelchair Mobility    Modified Rankin (Stroke Patients Only)       Balance Overall balance assessment: Needs assistance Sitting-balance support: No upper extremity supported;Feet supported;Feet  unsupported Sitting balance-Leahy Scale: Fair Sitting balance - Comments: Balance limited mostly by pain.                              Cognition Arousal/Alertness: Awake/alert Behavior During Therapy: Flat affect Overall Cognitive Status: Within Functional Limits for tasks assessed                      Exercises      General Comments        Pertinent Vitals/Pain Pain Assessment: 0-10 Pain Score: 9  Pain Location: L foot and LE Pain Descriptors / Indicators: Aching;Constant Pain Intervention(s): Monitored during session;Premedicated before session;Repositioned    Home Living                      Prior Function            PT Goals (current goals can now be found in the care plan section) Acute Rehab PT Goals Patient Stated Goal: be able to return home PT Goal Formulation: With patient/family Time For Goal Achievement: 07/02/14 Potential to Achieve Goals: Fair Progress towards PT goals: Progressing toward goals    Frequency  Min 3X/week    PT Plan Current plan remains appropriate    Co-evaluation             End of  Session   Activity Tolerance: Patient limited by pain Patient left: in bed;with call bell/phone within reach;with family/visitor present     Time: 2035-5974 PT Time Calculation (min) (ACUTE ONLY): 16 min  Charges:  $Therapeutic Activity: 8-22 mins                    G CodesCatarina Hartshorn, [place] 06/20/2014, 10:58 AM

## 2014-06-20 NOTE — Progress Notes (Signed)
Inpatient Diabetes Program Recommendations  AACE/ADA: New Consensus Statement on Inpatient Glycemic Control (2013)  Target Ranges:  Prepandial:   less than 140 mg/dL      Peak postprandial:   less than 180 mg/dL (1-2 hours)      Critically ill patients:  140 - 180 mg/dL     Results for Logan [name], Logan [name] (MRN [id number]) as of 06/20/2014 09:25  Ref. Range 06/18/2014 08:25 06/18/2014 10:30 06/18/2014 17:09 06/18/2014 18:03 06/18/2014 21:27  Glucose-Capillary Latest Range: 70-99 mg/dL 54 (L) 115 (H) 56 (L) 73 123 (H)    Results for Logan [name], Logan [name] (MRN [id number]) as of 06/20/2014 09:25  Ref. Range 06/19/2014 06:29 06/19/2014 12:20 06/19/2014 16:09 06/19/2014 21:30  Glucose-Capillary Latest Range: 70-99 mg/dL 58 (L) 98 205 (H) 286 (H)    Results for Logan [name], Logan [name] (MRN [id number]) as of 06/20/2014 09:25  Ref. Range 06/20/2014 09:02  Glucose-Capillary Latest Range: 70-99 mg/dL 263 (H)     **Issues with Hypoglycemia over the weekend.  Patient did not receive any insulin on 01/30.  **Note patient underwent toe amputation on 01/31.  **Glucose levels now elevated today.   Spoke with Logan Locket, PA with Vascular Team.  Got telephone order to start 25% of patient's home dose of Lantus- Lantus 15 units bid.  Order placed.  Called RN, Logan [name] to alert RN to new order to start today.     Will follow Logan Quaker RN, MSN, CDE Diabetes Coordinator Inpatient Diabetes Program Team Pager: [phone] (8a-10p)

## 2014-06-20 NOTE — Clinical Social Work Psychosocial (Signed)
Clinical Social Work Department BRIEF PSYCHOSOCIAL ASSESSMENT 06/20/2014  Patient:  Logan [name], Logan [name]     Account Number:  [id number]     Admit date:  06/17/2014  Clinical Social Worker:  Marciano Sequin  Date/Time:  06/20/2014 11:56 AM  Referred by:  RN  Date Referred:  06/20/2014 Referred for  SNF Placement   Other Referral:   Interview type:  Patient Other interview type:    PSYCHOSOCIAL DATA Living Status:  WIFE Admitted from facility:   Level of care:   Primary support name:  [place],Jessica Primary support relationship to patient:  SPOUSE Degree of support available:   Strong Support    CURRENT CONCERNS Current Concerns  None Noted   Other Concerns:    SOCIAL WORK ASSESSMENT / PLAN CSW met the pt at the bedside. CSW introduced self and purpose of the visit. CSW and pt discussed clinical recommendations for SNF rehab. CSW explained the SNF process to the pt. CSW provided pt with a SNF list. CSW explained insurance component and its relation to SNF placement.  CSW answered all questions in which the pt inquired about. CSW provided the pt with contact information for further questions. CSW will continue to follow this pt and assist with discharge as needed.   Assessment/plan status:  Psychosocial Support/Ongoing Assessment of Needs Other assessment/ plan:   Information/referral to community resources:    PATIENT'S/FAMILY'S RESPONSE TO PLAN OF CARE: Pt reported having a lengthy conversation with the MD regarding rehab. Pt expressed interested in going home, but the pt did agree to rehab.      [place], MSW, [place]

## 2014-06-20 NOTE — Telephone Encounter (Addendum)
-----   Message from Mena Goes, RN sent at 06/20/2014 10:55 AM EST ----- Regarding: Schedule   ----- Message -----    From: Gabriel Earing, PA-C    Sent: 06/20/2014  10:32 AM      To: Vvs Charge Pool  S/p left ax-fem bypass.  F/u with Dr. Kellie Simmering in 2 weeks.  Thanks, Samantha  06/20/14: no answer, no vm- mailed letter, dpm

## 2014-06-20 NOTE — Clinical Social Work Placement (Addendum)
Clinical Social Work Department CLINICAL SOCIAL WORK PLACEMENT NOTE 06/20/2014  Patient:  Logan [name], Logan [name]  Account Number:  [id number] Admit date:  06/17/2014  Clinical Social Worker:  Paulette Blanch BIBBS, LCSWA  Date/time:  06/20/2014 12:04 PM  Clinical Social Work is seeking post-discharge placement for this patient at the following level of care:   SKILLED NURSING   (*CSW will update this form in Epic as items are completed)   06/20/2014  Patient/family provided with [place] Department of Clinical Social Work's list of facilities offering this level of care within the geographic area requested by the patient (or if unable, by the patient's family).  06/20/2014  Patient/family informed of their freedom to choose among providers that offer the needed level of care, that participate in Medicare, Medicaid or managed care program needed by the patient, have an available bed and are willing to accept the patient.  06/20/2014  Patient/family informed of [facility]' ownership interest in [facility], as well as of the fact that they are under no obligation to receive care at this facility.  PASARR submitted to EDS on 06/20/2014 PASARR number received on 06/20/2014  FL2 transmitted to all facilities in geographic area requested by pt/family on  06/20/2014 FL2 transmitted to all facilities within larger geographic area on 06/20/2014  Patient informed that his/her managed care company has contracts with or will negotiate with  certain facilities, including the following:     Patient/family informed of bed offers received:  06/20/14 Patient chooses bed at [facility] Physician recommends and patient chooses bed at    Patient to be transferred to [facility] on  06/21/14 Patient to be transferred to facility by private vechile Patient and family notified of transfer on 06/21/14 Name of family member notified:  Wife Eliezer Lofts [place], [place], [place])  The following physician request  were entered in Epic:   Additional Comments:  [place], MSW, Jess[place]

## 2014-06-20 NOTE — Clinical Social Work Psychosocial (Unsigned)
     Clinical Social Work Department BRIEF PSYCHOSOCIAL ASSESSMENT 06/20/2014  Patient:  Logan [name], Logan [name]     Account Number:  [id number]     Admit date:  06/17/2014  Clinical Social Worker:  ,  Date/Time:     Referred by:    Date Referred:    Other Referral:   Interview type:   Other interview type:    PSYCHOSOCIAL DATA Living Status:   Admitted from facility:   Level of care:   Primary support name:   Primary support relationship to patient:   Degree of support available:    CURRENT CONCERNS  Other Concerns:    SOCIAL WORK ASSESSMENT / PLAN  Assessment/plan status:   Other assessment/ plan:   Information/referral to community resources:    PATIENTS/FAMILYS RESPONSE TO PLAN OF CARE:

## 2014-06-21 DIAGNOSIS — I739 Peripheral vascular disease, unspecified: Secondary | ICD-10-CM | POA: Diagnosis not present

## 2014-06-21 DIAGNOSIS — Z4889 Encounter for other specified surgical aftercare: Secondary | ICD-10-CM | POA: Diagnosis not present

## 2014-06-21 DIAGNOSIS — I429 Cardiomyopathy, unspecified: Secondary | ICD-10-CM | POA: Diagnosis not present

## 2014-06-21 DIAGNOSIS — L97819 Non-pressure chronic ulcer of other part of right lower leg with unspecified severity: Secondary | ICD-10-CM | POA: Diagnosis not present

## 2014-06-21 DIAGNOSIS — L97529 Non-pressure chronic ulcer of other part of left foot with unspecified severity: Secondary | ICD-10-CM | POA: Diagnosis not present

## 2014-06-21 DIAGNOSIS — R262 Difficulty in walking, not elsewhere classified: Secondary | ICD-10-CM | POA: Diagnosis not present

## 2014-06-21 DIAGNOSIS — Z9889 Other specified postprocedural states: Secondary | ICD-10-CM

## 2014-06-21 DIAGNOSIS — E119 Type 2 diabetes mellitus without complications: Secondary | ICD-10-CM | POA: Diagnosis not present

## 2014-06-21 DIAGNOSIS — M6281 Muscle weakness (generalized): Secondary | ICD-10-CM | POA: Diagnosis not present

## 2014-06-21 DIAGNOSIS — I10 Essential (primary) hypertension: Secondary | ICD-10-CM | POA: Diagnosis not present

## 2014-06-21 LAB — GLUCOSE, CAPILLARY
GLUCOSE-CAPILLARY: 228 mg/dL — AB (ref 70–99)
Glucose-Capillary: 182 mg/dL — ABNORMAL HIGH (ref 70–99)

## 2014-06-21 MED ORDER — CIPROFLOXACIN HCL 500 MG PO TABS: 500.0000 mg | ORAL_TABLET | Freq: Two times a day (BID) | ORAL | Status: AC

## 2014-06-21 MED ORDER — OXYCODONE HCL 5 MG PO TABS: 5.0000 mg | ORAL_TABLET | Freq: Three times a day (TID) | ORAL | Status: DC | PRN

## 2014-06-21 NOTE — Progress Notes (Signed)
VASCULAR LAB PRELIMINARY  ARTERIAL  ABI completed:    RIGHT    LEFT    PRESSURE WAVEFORM  PRESSURE WAVEFORM  BRACHIAL 146 Triphasic BRACHIAL 135 Triphasic  DP Absent  DP Unable to obtain due to bandages   PT 63 Dampened Monophasic PT 100  Monophasic  PER 52 Severely Dampened Monophasic PER 81 Monophasic    RIGHT LEFT  ABI 0.43 0.68   Right ABI indicates a severe reduction in arterial flow. Left ABI indicates a Moderate reduction in arterial flow. Doppler waveforms were abnormal bilaterally.  Trenton Passow, RVS 06/21/2014, 4:21 PM

## 2014-06-21 NOTE — Clinical Social Work Note (Signed)
Patient and family chooses [facility]- bed available.  Family will transport.  CSW will continue to follow.  Domenica Reamer, [place] Social Worker [phone]

## 2014-06-21 NOTE — Progress Notes (Addendum)
Reported called to [place] [facility]. Sherrie Mustache 1:22 PM   Pt discharged to NH with son Discharge packet given to patient Education discussed  IV dc'd  Tele dc'd  Pt discharged via wheelchair with volunteer services.  All pt belongs at side.  Kathleen Argue S 2:01 PM

## 2014-06-21 NOTE — Progress Notes (Signed)
Medicare Important Message given? YES  (If response is "NO", the following Medicare IM given date fields will be blank)  Date Medicare IM given: 06/21/14 Medicare IM given by:  Dahlia Client [employer]

## 2014-06-21 NOTE — Clinical Social Work Note (Signed)
Patient will discharge to [name] Anticipated discharge date:06/21/14 Family notified:wife Transportation by family  CSW signing off.  Domenica Reamer, [place] Social Worker [phone]

## 2014-06-21 NOTE — Discharge Summary (Signed)
Discharge Summary     Logan [name] 05/19/1946 69 y.o. male  [id number]  Admission Date: 06/17/2014  Discharge Date: 06/21/14  Physician: Mal Misty, MD  Admission Diagnosis: Peripheral vascular disease with left foot ulcer I70.244 infection   HPI:   This is a 69 y.o. male patient of Dr. Kellie Simmering had an aortobifemoral bypass graft with occlusion of the right limb. He also has a left-to-right femoral-femoral bypass graft on 12/20/11. He had a history of a draining sinus from the left inguinal area. This closed without any evidence of residual infection. Patient developed a scrotal abscess apparently at his 2013 visit and had a small draining sinus lateral to the right inguinal incision, this has healed. The patient returns today referred by Dr. Blanch Media, podiatrist, for worsening left foot ulcer,   He has claudication in both calves with standing and walking only about 20 feet, relieved by rest; this severe claudication started when he ran out of his short acting insulin in May or June 2015 and his his serum glucose increased significantly.  Pt denies any history of stroke or TIA. He has cardiac stents, did not experience any symptoms of an MI, but was told that he had an MI.  Pt Diabetic: Yes, states his home glucose readings are around 150 Pt smoker: smoker (1 cigar every 2-4 days, stopped cigarettes in 2012)  Pt meds include: Statin :Yes ASA: Yes Other anticoagulants/antiplatelets: he states that he may be taking clopidogrel, prescribed by Dr. Brigitte Pulse for his PAD, per pt   Dr. Evelena Leyden progress note from 02/22/14:  Patient with occlusion of aortobifemoral graft with history of draining sinus and left inguinal area. No active infection at the present time. No limb threatening ischemia at the present time. Patient is tolerating occlusion of the graft. Will not plan any inflow procedure as long as patient does not have limb threatening ischemia because of high likelihood of  infection. He would need axillofemoral graft and high likelihood that left inguinal area would become infected since there has been infection there in the past. Discusses with patient and he is in agreement with that. He will return in 6 months with repeat ABIs unless symptoms worsen.    Hospital Course:  The patient was admitted to the hospital and taken to the operating room on 06/17/2014 and underwent: Left axillary to profunda femoris bypass using 51mm Hemashield Dacron graft.  The pt tolerated the procedure well and was transported to the PACU in good condition.  By POD 1, he did have a palpable graft pulse in the axillary femoral portion.  There was some slight fullness in the infraclavicular region.  He was kept in stepdown that evening.  The pt did have issues with hypoglycemia and the diabetic coordinator had been consulted to help manage his insulin.    On June 19, 2014, the pt was taken to the operating room and underwent fifth toe amputation, MPT joint on the left.  He tolerated well and was transferred to the PACU in stable condition.  By the next day, his dressing was dry and in tact.  He is able to have full weight bearing on the left foot in a post op shoe.  The dressing is to remain in tact until his follow up visit with Dr. Ninfa Linden.  PT evaluated the pt on 06/20/14 and felt that SNF would be appropriate for this pt.  A social work consult was obtained for placement.  By POD 4, he continues to complain of significant  pain.  His chest and left inguinal incisions are healing nicely with a 3+ pulse in the left axillofemoral graft.  The pt will be discharged on 2 weeks of Cipro for wound infection left foot.  The remainder of the hospital course consisted of increasing mobilization and increasing intake of solids without difficulty.  CBC    Component Value Date/Time   WBC 12.3* 06/18/2014 0240   RBC 3.60* 06/18/2014 0240   HGB 10.4* 06/18/2014 0240   HCT 32.0* 06/18/2014 0240    PLT 125* 06/18/2014 0240   MCV 88.9 06/18/2014 0240   MCH 28.9 06/18/2014 0240   MCHC 32.5 06/18/2014 0240   RDW 14.1 06/18/2014 0240   LYMPHSABS 3.3 05/04/2014 1752   MONOABS 0.7 05/04/2014 1752   EOSABS 0.2 05/04/2014 1752   BASOSABS 0.0 05/04/2014 1752    BMET    Component Value Date/Time   NA 135 06/18/2014 0240   K 3.8 06/18/2014 0240   CL 105 06/18/2014 0240   CO2 22 06/18/2014 0240   GLUCOSE 153* 06/19/2014 0759   BUN 9 06/18/2014 0240   CREATININE 1.19 06/18/2014 0240   CREATININE 1.12 08/12/2012 1227   CALCIUM 8.2* 06/18/2014 0240   GFRNONAA 60* 06/18/2014 0240   GFRNONAA 67 08/12/2012 1227   GFRAA 70* 06/18/2014 0240   GFRAA 78 08/12/2012 1227     Discharge Instructions:   The patient is discharged with extensive instructions on wound care and progressive ambulation.  They are instructed not to drive or perform any heavy lifting until returning to see the physician in his office.      Discharge Instructions    Call MD for:  redness, tenderness, or signs of infection (pain, swelling, bleeding, redness, odor or green/yellow discharge around incision site)    Complete by:  As directed      Call MD for:  severe or increased pain, loss or decreased feeling  in affected limb(s)    Complete by:  As directed      Call MD for:  temperature >100.5    Complete by:  As directed      Discharge wound care:    Complete by:  As directed   Wash the groin wound with soap and water daily and pat dry. (No tub bath-only shower)  Then put a dry gauze or washcloth there to keep this area dry daily and as needed.  Do not use Vaseline or neosporin on your incisions.  Only use soap and water on your incisions and then protect and keep dry.     Driving Restrictions    Complete by:  As directed   No driving for 2 weeks     Lifting restrictions    Complete by:  As directed   No lifting for 4 weeks     Resume previous diet    Complete by:  As directed            Discharge  Diagnosis:  Peripheral vascular disease with left foot ulcer I70.244 infection  Secondary Diagnosis: Patient Active Problem List   Diagnosis Date Noted  . Non-healing wound of lower extremity 06/17/2014  . Pain of left lower leg- and Right Leg 02/15/2014  . Scrotal swelling 11/17/2012  . Aftercare following surgery of the circulatory system, [place] 11/17/2012  . Abscess, perirectal 08/12/2012  . Fever 06/04/2012  . DM type 2 (diabetes mellitus, type 2) 06/03/2012  . Non compliance w medication regimen 06/03/2012  . Hypoglycemia 06/03/2012  . Abscess in epidural space  of lumbar spine 06/02/2012  . Spinal stenosis of lumbosacral region 06/02/2012  . Osteomyelitis of vertebra of lumbosacral region 05/27/2012  . PVD (peripheral vascular disease) 04/21/2012  . Atherosclerosis of native arteries of the extremities with intermittent claudication 01/31/2012  . Atherosclerotic PVD with intermittent claudication 01/17/2012  . PAD (peripheral artery disease) 01/03/2012  . Unspecified circulatory system disorder 12/20/2011  . Critical lower limb ischemia 12/20/2011  . TOBACCO ABUSE 07/23/2010  . PURE HYPERCHOLESTEROLEMIA 02/17/2009  . HYPERTENSION 02/17/2009  . CORONARY ATHEROSCLEROSIS NATIVE CORONARY ARTERY 02/17/2009  . OTH ATHEROSCLEROSIS NATIVE ARTERIES EXTREMITIES 02/17/2009  . OTH MUSCULOSKELETAL SX REFERABLE LIMBS OTH 02/17/2009  . EDEMA 02/17/2009  . SHORTNESS OF BREATH 02/17/2009  . POSTSURG PERCUT TRANSLUMINAL COR ANGPLSTY STS 02/17/2009  . DYSLIPIDEMIA 02/16/2009  . OVERWEIGHT 02/16/2009  . CAD 02/16/2009  . CARDIOMYOPATHY 02/16/2009  . PERIPHERAL VASCULAR DISEASE 02/16/2009   Past Medical History  Diagnosis Date  . Overweight(278.02)   . Dyslipidemia   . Cardiomyopathy   . PVD (peripheral vascular disease)     right to left fem -fem bypass 1997/ aortabifemoral bypass 2005 right to left fem-fem 07/2004. Secondary to occluded righ tlimb of aortobifemoral bypass  . Tobacco  abuse   . Anginal pain   . Hypertension   . Shortness of breath   . Coronary artery disease     taxis DES stent circumflex 02/2003.... residual total distal circumflex and 50% LAD/ cardiolite 08/2006 inferior scar no ischemia  . Degenerative disc disease, lumbar   . Coronary artery disease   . Myocardial infarction   . Diabetes mellitus     type 2  . History of blood transfusion   . Cataracts, bilateral        Medication List    STOP taking these medications        chlorhexidine 4 % external liquid  Commonly known as:  HIBICLENS     doxycycline 100 MG capsule  Commonly known as:  VIBRAMYCIN     HYDROcodone-acetaminophen 7.5-325 MG per tablet  Commonly known as:  NORCO     oxyCODONE-acetaminophen 5-325 MG per tablet  Commonly known as:  PERCOCET      TAKE these medications        amLODipine 10 MG tablet  Commonly known as:  NORVASC  Take 10 mg by mouth daily.     aspirin EC 81 MG tablet  Take 81 mg by mouth daily.     ciprofloxacin 500 MG tablet  Commonly known as:  CIPRO  Take 1 tablet (500 mg total) by mouth 2 (two) times daily.     clopidogrel 75 MG tablet  Commonly known as:  PLAVIX  Take 75 mg by mouth daily.     Fish Oil 1000 MG Caps  Take 2,000 mg by mouth 2 (two) times daily.     glimepiride 4 MG tablet  Commonly known as:  AMARYL  Take 4 mg by mouth 2 (two) times daily.     indomethacin 50 MG capsule  Commonly known as:  INDOCIN  Take 50 mg by mouth 2 (two) times daily as needed (gout flare-up).     insulin aspart 100 UNIT/ML injection  Commonly known as:  novoLOG  - CBG 70 - 120: 0 units  - CBG 121 - 150: 2 units  - CBG 151 - 200: 3 units  - CBG 201 - 250: 5 units  - CBG 251 - 300: 8 units  -  Call MD if CBg is greater than  200     insulin glargine 100 UNIT/ML injection  Commonly known as:  LANTUS  Inject 50 Units into the skin 2 (two) times daily.     insulin lispro 100 UNIT/ML injection  Commonly known as:  HUMALOG  Inject 10  Units into the skin 3 (three) times daily before meals.     insulin lispro 100 UNIT/ML injection  Commonly known as:  HUMALOG  Inject 10 Units into the skin 3 (three) times daily before meals.     lisinopril 40 MG tablet  Commonly known as:  PRINIVIL,ZESTRIL  Take 40 mg by mouth at bedtime.     metFORMIN 500 MG 24 hr tablet  Commonly known as:  GLUCOPHAGE-XR  Take 1,000 mg by mouth 2 (two) times daily.     methocarbamol 500 MG tablet  Commonly known as:  ROBAXIN  Take 1 tablet (500 mg total) by mouth every 6 (six) hours as needed.     multivitamin with minerals Tabs tablet  Take 1 tablet by mouth daily.     mupirocin ointment 2 %  Commonly known as:  BACTROBAN  Intranasally twice daily for 7 days with antibiotic wash     oxyCODONE 5 MG immediate release tablet  Commonly known as:  Oxy IR/ROXICODONE  Take 1 tablet (5 mg total) by mouth every 8 (eight) hours as needed for severe pain.     polyethylene glycol packet  Commonly known as:  MIRALAX / GLYCOLAX  Take 17 g by mouth daily as needed.     saccharomyces boulardii 250 MG capsule  Commonly known as:  FLORASTOR  Take 1 capsule (250 mg total) by mouth 2 (two) times daily.     silver sulfADIAZINE 1 % cream  Commonly known as:  SILVADENE  Apply 1 application topically daily as needed (wound care).     simvastatin 20 MG tablet  Commonly known as:  ZOCOR  Take 20 mg by mouth at bedtime.       Prescriptions given: Roxicodone #30 No Refill Cipro 500mg  bid x 2 weeks #28 NR  Disposition: SNF  Patient's condition: is Good  Follow up: 1. Dr. Kellie Simmering in 2 weeks 2. Dr. Ninfa Linden in 1 week  Dressings: 1.  Wash the groin wound with soap and water daily and pat dry. (No tub bath-only shower)  Then put a dry gauze or washcloth there to keep this area dry daily and as needed.  Do not use Vaseline or neosporin on your incisions.  Only use soap and water on your incisions and then protect and keep dry.  2.  Left foot dressing  to remain in tact and dry until f/u appointment with Dr. Elenor Quinones, PA-C [facility] [phone] 06/21/2014  11:21 AM  - For VQI Registry use --- Instructions: Press F2 to tab through selections.  Delete question if not applicable.   Post-op:  Wound infection: No  Graft infection: No  Transfusion: No  If yes, n/a units given New Arrhythmia: No Ipsilateral amputation: No, [ ]  Minor, [ ]  BKA, [ ]  AKA Discharge patency: [ x] Primary, [ ]  Primary assisted, [ ]  Secondary, [ ]  Occluded Patency judged by: [ ]  Dopper only, [x ] Palpable graft pulse, [ ]  Palpable distal pulse, [ ]  ABI inc. > 0.15, [ ]  Duplex Discharge ABI: R not done, L not done Discharge TBI: R , L  D/C Ambulatory Status: Ambulatory with Assistance  Complications: MI: No, [ ]  Troponin only, [ ]   EKG or Clinical CHF: No Resp failure:No, [ ]  Pneumonia, [ ]  Ventilator Chg in renal function: No, [ ]  Inc. Cr > 0.5, [ ]  Temp. Dialysis, [ ]  Permanent dialysis Stroke: No, [ ]  Minor, [ ]  Major Return to OR: No  Reason for return to OR: [ ]  Bleeding, [ ]  Infection, [ ]  Thrombosis, [ ]  Revision  Discharge medications: Statin use:  yes ASA use:  yes Plavix use:  yes Beta blocker use: no Coumadin use: no ACEI:  yes

## 2014-06-21 NOTE — Care Management Note (Signed)
    Page 1 of 1   06/21/2014     11:58:36 AM CARE MANAGEMENT NOTE 06/21/2014  Patient:  Logan [name], Logan [name]   Account Number:  [id number]  Date Initiated:  06/21/2014  Documentation initiated by:  Marvetta Gibbons  Subjective/Objective Assessment:   Pt admitted s/p left fem bypass graft with toe amputation     Action/Plan:   PTA pt lived at home- PT/OT recommending SNF- CSW following for placement   Anticipated DC Date:  06/21/2014   Anticipated DC Plan:  SKILLED NURSING FACILITY  In-house referral  Clinical Social Worker      DC Planning Services  CM consult      Choice offered to / List presented to:             Status of service:  Completed, signed off Medicare Important Message given?  YES (If response is "NO", the following Medicare IM given date fields will be blank) Date Medicare IM given:  06/21/2014 Medicare IM given by:  Marvetta Gibbons Date Additional Medicare IM given:   Additional Medicare IM given by:    Discharge Disposition:  [facility]  Per UR Regulation:  Reviewed for med. necessity/level of care/duration of stay  If discussed at [place] of Stay Meetings, dates discussed:    Comments:

## 2014-06-21 NOTE — Progress Notes (Signed)
Patient ID: Logan [name], male   DOB: 12-12-1945, 69 y.o.   MRN: [id number] Vascular Surgery Progress Note  Subjective: 4 days post left axillofemoral bypass 2 days post left lateral foot amputation by Dr. Vincenza Hews to complain of significant pain PT has recommended skilled nursing facility  Objective:  Filed Vitals:   06/21/14 0500  BP: 131/82  Pulse: 105  Temp: 98.9 F (37.2 C)  Resp: 18    Gen. alert and oriented 3 Chest and left inguinal incisions healing nicely with 3+ pulse and left axillofemoral graft Left foot dressing intact per Dr. Ninfa Linden   Labs:  Recent Labs Lab 06/18/14 0240  CREATININE 1.19    Recent Labs Lab 06/18/14 0240 06/19/14 0759  NA 135  --   K 3.8  --   CL 105  --   CO2 22  --   BUN 9  --   CREATININE 1.19  --   GLUCOSE 48* 153*  CALCIUM 8.2*  --     Recent Labs Lab 06/18/14 0240  WBC 12.3*  HGB 10.4*  HCT 32.0*  PLT 125*   No results for input(s): INR in the last 168 hours.  I/O last 3 completed shifts: In: 2000 [P.O.:1200; I.V.:500; IV Piggyback:300] Out: 1017 [Urine:5325]  Imaging: No results found.  Assessment/Plan:  POD #4  LOS: 4 days  s/p Procedure(s): Fifth toe amputaion, MPT Joint  Plan DC to skilled nursing facility as soon as this can be arranged-no need for further hospitalization from vascular surgery standpoint Patient to return to see Dr. Ninfa Linden in one week and to see me in 2 weeks   Tinnie Gens, MD 06/21/2014 9:10 AM

## 2014-06-22 ENCOUNTER — Telehealth: Payer: Self-pay

## 2014-06-22 DIAGNOSIS — E119 Type 2 diabetes mellitus without complications: Secondary | ICD-10-CM | POA: Diagnosis not present

## 2014-06-22 DIAGNOSIS — I10 Essential (primary) hypertension: Secondary | ICD-10-CM | POA: Diagnosis not present

## 2014-06-22 NOTE — Telephone Encounter (Signed)
Verbal order given to Santiago Glad, Therapist, sports at [facility] for Oxycodone IR 10mg  every 6 hours as needed for pain; requested by Dr. Kellie Simmering.

## 2014-06-28 ENCOUNTER — Encounter (HOSPITAL_COMMUNITY): Payer: Medicare Other

## 2014-06-28 ENCOUNTER — Ambulatory Visit: Payer: Medicare Other | Admitting: Vascular Surgery

## 2014-07-04 ENCOUNTER — Ambulatory Visit (INDEPENDENT_AMBULATORY_CARE_PROVIDER_SITE_OTHER): Payer: Self-pay | Admitting: Family

## 2014-07-04 ENCOUNTER — Encounter: Payer: Self-pay | Admitting: Family

## 2014-07-04 VITALS — BP 130/80 | HR 119 | Resp 18 | Ht 69.0 in | Wt 203.0 lb

## 2014-07-04 DIAGNOSIS — Z720 Tobacco use: Secondary | ICD-10-CM

## 2014-07-04 DIAGNOSIS — I739 Peripheral vascular disease, unspecified: Secondary | ICD-10-CM

## 2014-07-04 DIAGNOSIS — Z4802 Encounter for removal of sutures: Secondary | ICD-10-CM

## 2014-07-04 DIAGNOSIS — Z9889 Other specified postprocedural states: Secondary | ICD-10-CM

## 2014-07-04 DIAGNOSIS — Z95828 Presence of other vascular implants and grafts: Secondary | ICD-10-CM

## 2014-07-04 DIAGNOSIS — E1151 Type 2 diabetes mellitus with diabetic peripheral angiopathy without gangrene: Secondary | ICD-10-CM

## 2014-07-04 DIAGNOSIS — Z87891 Personal history of nicotine dependence: Secondary | ICD-10-CM

## 2014-07-04 DIAGNOSIS — E1159 Type 2 diabetes mellitus with other circulatory complications: Secondary | ICD-10-CM

## 2014-07-04 NOTE — Progress Notes (Signed)
Postoperative Visit   History of Present Illness  Logan [name] is a 69 y.o. year old male patient of Dr. Kellie Simmering who is status post left axillary to profunda femoris bypass using 8 mm Hemashield Dacron graft on 06/17/14, and left lateral foot amputation by Dr. Ninfa Linden on 06/19/14. He is convalescing at [facility].    His previous vascular surgeries include an aortobifemoral bypass graft with occlusion of the right limb. He also had a left-to-right femoral-femoral bypass graft on 12/20/11. He had a history of a draining sinus from the left inguinal area. This closed without any evidence of residual infection. Patient developed a scrotal abscess apparently at his 2013 visit and had a small draining sinus lateral to the right inguinal incision, this has healed.  Pt Diabetic: Yes, states his home glucose readings are around 88-150 Pt smoker: smoker (1 cigar every 2-4 days, stopped cigarettes in 2012, has not smoked since 06/16/14)  Pt meds include: Statin :Yes ASA: Yes Other anticoagulants/antiplatelets: he states that he may be taking clopidogrel, prescribed by Dr. Brigitte Pulse for his PAD, per pt  He returns today for left groin suture removal; is scheduled to follow up with Dr. Kellie Simmering on 07/12/14.  The patient's wounds are healed.  He c/o of left foot postoperative pain that is adequately relieved by his current analgesics.  The patient is receiving physical therapy while in the nursing facility. The nursing facility changes his left foot dressing daily as managed by ortho.  For VQI Use Only  PRE-ADM LIVING: Home  AMB STATUS: Ambulatory  Physical Examination  Filed Vitals:   07/04/14 1156  BP: 130/80  Pulse: 119  Resp: 18  Height: 5\' 9"  (1.753 m)  Weight: 203 lb (92.08 kg)   Body mass index is 29.96 kg/(m^2).   General: A&O x 3, WDWN, obese male. Gait: normal Eyes: Pupils are equal Pulmonary:Respirations are non labored. Cardiac: regular Rythm , without detected  murmur.     Carotid Bruits Right Left   Negative Negative  Aorta is not palpable. Left axillary graft is palpable. Radial pulses: absent right radial, palpable left radial, palpable right brachial pulse.   VASCULAR EXAM: Extremities with ischemic changes: proximal portion of toes of both feet are no longer dusky, are warm, without Gangrene; left foot dressing intact with small amount of dried yellow drainage. Left upper chest incision well healed, Dermabond at closed incision. Left groin incision edges well proximated, sutures in place, no swelling, no drainage, no erythema.    LE Pulses Right Left   FEMORAL not palpable not palpable    POPLITEAL not palpable  not palpable   POSTERIOR TIBIAL not palpable not palpable, no audible Doppler signal    DORSALIS PEDIS  ANTERIOR TIBIAL 1+ palpable not palpable, monophasic by Doppler   PERONEAL not Palpable   not Palpable, monophasic by Doppler    Abdomen: soft, NT, no masses palpated. Skin: no rashes, see extremities. Musculoskeletal: no muscle wasting or atrophy. Neurologic: A&O X 3; Appropriate Affect, MOTOR FUNCTION: moving all extremities equally, motor strength 4/5 throughout. Speech is fluent/normal. CN 2-12 grossly intact.         Left upper chest and left groin: Incisions are well healed, left flank graft pulse is palpable, pedal pulses are as above.  Medical Decision Making  Logan [name] is a 69 y.o. year old male who is s/p left axillary to profunda femoris bypass using 8 mm Hemashield Dacron graft on 06/17/14, and left lateral foot amputation by  Dr. Ninfa Linden on 06/19/14. He is convalescing at [facility].    His previous vascular surgeries include an  aortobifemoral bypass graft with occlusion of the right limb. He also had a left-to-right femoral-femoral bypass graft on 12/20/11. He had a history of a draining sinus from the left inguinal area. This closed without any evidence of residual infection. Patient developed a scrotal abscess apparently at his 2013 visit and had a small draining sinus lateral to the right inguinal incision, this has healed.  Left groin sutures removed today; instructions to pt and nursing facility: wash left groin daily with soap and warm water, otherwise keep left groin clean and dry.  Return as scheduled on 07/12/14 to see Dr. Kellie Simmering.  The patient's bypass incisions are healing appropriately; he is experiencing pain in his left foot post amputation of lateral aspect of left foot, his pain seems adequately controlled by his current analgesic.   NICKEL, Sharmon Leyden, RN, MSN, FNP-C [facility] [place] Office: [phone]  07/04/2014, 11:52 AM  Clinic MD: Trula Slade

## 2014-07-11 ENCOUNTER — Encounter: Payer: Self-pay | Admitting: Vascular Surgery

## 2014-07-12 ENCOUNTER — Encounter: Payer: Self-pay | Admitting: Vascular Surgery

## 2014-07-12 ENCOUNTER — Ambulatory Visit (INDEPENDENT_AMBULATORY_CARE_PROVIDER_SITE_OTHER): Payer: Self-pay | Admitting: Vascular Surgery

## 2014-07-12 VITALS — BP 131/77 | HR 106 | Resp 18 | Ht 69.0 in | Wt 200.0 lb

## 2014-07-12 DIAGNOSIS — I739 Peripheral vascular disease, unspecified: Secondary | ICD-10-CM

## 2014-07-12 NOTE — Progress Notes (Signed)
Subjective:     Patient ID: Logan [name], male   DOB: Oct 27, 1945, 69 y.o.   MRN: [id number]  HPI this 69 year old male returns 2 weeks post left asked low to profunda femoris bypass and amputation of left fifth toe by Dr. Zollie Beckers. Patient is currently in a skilled nursing facility and is ambulating on his own. He states he is ready to leave that facility. He denies any chills and fever. States that the foot feels better. Review of Systems     Objective:   Physical Exam BP 131/77 mmHg  Pulse 106  Resp 18  Ht 5\' 9"  (1.753 m)  Wt 200 lb (90.719 kg)  BMI 29.52 kg/m2  Gen. well-developed nourished male no apparent stress alert and oriented 3 Left infra clavicular and inguinal wounds well healed. 3+ pulse in left asked low femoral graft. Left fifth toe amputation site reveals nice healing with no evidence of infection.     Assessment:     Nicely functioning left asked low to profunda femoris bypass with healing of fifth toe amputation performed by Dr. Ninfa Linden    Plan:     Return to see nurse practitioner in 3 months with ABIs Patient ready to be discharged from skilled nursing facility

## 2014-07-13 NOTE — Addendum Note (Signed)
Addended by: Mena Goes on: 07/13/2014 02:11 PM   Modules accepted: Orders

## 2014-07-14 DIAGNOSIS — I10 Essential (primary) hypertension: Secondary | ICD-10-CM | POA: Diagnosis not present

## 2014-07-14 DIAGNOSIS — E119 Type 2 diabetes mellitus without complications: Secondary | ICD-10-CM | POA: Diagnosis not present

## 2014-07-16 DIAGNOSIS — Z48812 Encounter for surgical aftercare following surgery on the circulatory system: Secondary | ICD-10-CM | POA: Diagnosis not present

## 2014-07-16 DIAGNOSIS — L97521 Non-pressure chronic ulcer of other part of left foot limited to breakdown of skin: Secondary | ICD-10-CM | POA: Diagnosis not present

## 2014-07-16 DIAGNOSIS — I429 Cardiomyopathy, unspecified: Secondary | ICD-10-CM | POA: Diagnosis not present

## 2014-07-16 DIAGNOSIS — E119 Type 2 diabetes mellitus without complications: Secondary | ICD-10-CM | POA: Diagnosis not present

## 2014-07-16 DIAGNOSIS — I739 Peripheral vascular disease, unspecified: Secondary | ICD-10-CM | POA: Diagnosis not present

## 2014-07-16 DIAGNOSIS — I10 Essential (primary) hypertension: Secondary | ICD-10-CM | POA: Diagnosis not present

## 2014-07-16 DIAGNOSIS — F17210 Nicotine dependence, cigarettes, uncomplicated: Secondary | ICD-10-CM | POA: Diagnosis not present

## 2014-07-16 DIAGNOSIS — I2510 Atherosclerotic heart disease of native coronary artery without angina pectoris: Secondary | ICD-10-CM | POA: Diagnosis not present

## 2014-07-18 DIAGNOSIS — I429 Cardiomyopathy, unspecified: Secondary | ICD-10-CM | POA: Diagnosis not present

## 2014-07-18 DIAGNOSIS — E119 Type 2 diabetes mellitus without complications: Secondary | ICD-10-CM | POA: Diagnosis not present

## 2014-07-18 DIAGNOSIS — L97521 Non-pressure chronic ulcer of other part of left foot limited to breakdown of skin: Secondary | ICD-10-CM | POA: Diagnosis not present

## 2014-07-18 DIAGNOSIS — I739 Peripheral vascular disease, unspecified: Secondary | ICD-10-CM | POA: Diagnosis not present

## 2014-07-18 DIAGNOSIS — I2510 Atherosclerotic heart disease of native coronary artery without angina pectoris: Secondary | ICD-10-CM | POA: Diagnosis not present

## 2014-07-18 DIAGNOSIS — Z48812 Encounter for surgical aftercare following surgery on the circulatory system: Secondary | ICD-10-CM | POA: Diagnosis not present

## 2014-07-20 DIAGNOSIS — Z48812 Encounter for surgical aftercare following surgery on the circulatory system: Secondary | ICD-10-CM | POA: Diagnosis not present

## 2014-07-20 DIAGNOSIS — I2510 Atherosclerotic heart disease of native coronary artery without angina pectoris: Secondary | ICD-10-CM | POA: Diagnosis not present

## 2014-07-20 DIAGNOSIS — I739 Peripheral vascular disease, unspecified: Secondary | ICD-10-CM | POA: Diagnosis not present

## 2014-07-20 DIAGNOSIS — E119 Type 2 diabetes mellitus without complications: Secondary | ICD-10-CM | POA: Diagnosis not present

## 2014-07-20 DIAGNOSIS — I429 Cardiomyopathy, unspecified: Secondary | ICD-10-CM | POA: Diagnosis not present

## 2014-07-20 DIAGNOSIS — L97521 Non-pressure chronic ulcer of other part of left foot limited to breakdown of skin: Secondary | ICD-10-CM | POA: Diagnosis not present

## 2014-07-22 DIAGNOSIS — Z87891 Personal history of nicotine dependence: Secondary | ICD-10-CM | POA: Diagnosis not present

## 2014-07-22 DIAGNOSIS — J449 Chronic obstructive pulmonary disease, unspecified: Secondary | ICD-10-CM | POA: Diagnosis not present

## 2014-07-22 DIAGNOSIS — I2510 Atherosclerotic heart disease of native coronary artery without angina pectoris: Secondary | ICD-10-CM | POA: Diagnosis not present

## 2014-07-22 DIAGNOSIS — Z48812 Encounter for surgical aftercare following surgery on the circulatory system: Secondary | ICD-10-CM | POA: Diagnosis not present

## 2014-07-22 DIAGNOSIS — Z418 Encounter for other procedures for purposes other than remedying health state: Secondary | ICD-10-CM | POA: Diagnosis not present

## 2014-07-22 DIAGNOSIS — I429 Cardiomyopathy, unspecified: Secondary | ICD-10-CM | POA: Diagnosis not present

## 2014-07-22 DIAGNOSIS — I10 Essential (primary) hypertension: Secondary | ICD-10-CM | POA: Diagnosis not present

## 2014-07-22 DIAGNOSIS — I739 Peripheral vascular disease, unspecified: Secondary | ICD-10-CM | POA: Diagnosis not present

## 2014-07-22 DIAGNOSIS — E668 Other obesity: Secondary | ICD-10-CM | POA: Diagnosis not present

## 2014-07-22 DIAGNOSIS — E119 Type 2 diabetes mellitus without complications: Secondary | ICD-10-CM | POA: Diagnosis not present

## 2014-07-22 DIAGNOSIS — L97521 Non-pressure chronic ulcer of other part of left foot limited to breakdown of skin: Secondary | ICD-10-CM | POA: Diagnosis not present

## 2014-07-27 DIAGNOSIS — L97521 Non-pressure chronic ulcer of other part of left foot limited to breakdown of skin: Secondary | ICD-10-CM | POA: Diagnosis not present

## 2014-07-27 DIAGNOSIS — Z48812 Encounter for surgical aftercare following surgery on the circulatory system: Secondary | ICD-10-CM | POA: Diagnosis not present

## 2014-07-27 DIAGNOSIS — I429 Cardiomyopathy, unspecified: Secondary | ICD-10-CM | POA: Diagnosis not present

## 2014-07-27 DIAGNOSIS — I2510 Atherosclerotic heart disease of native coronary artery without angina pectoris: Secondary | ICD-10-CM | POA: Diagnosis not present

## 2014-07-27 DIAGNOSIS — I739 Peripheral vascular disease, unspecified: Secondary | ICD-10-CM | POA: Diagnosis not present

## 2014-07-27 DIAGNOSIS — E119 Type 2 diabetes mellitus without complications: Secondary | ICD-10-CM | POA: Diagnosis not present

## 2014-08-22 ENCOUNTER — Encounter (HOSPITAL_COMMUNITY): Payer: Medicare Other

## 2014-08-22 ENCOUNTER — Ambulatory Visit: Payer: Medicare Other | Admitting: Family

## 2014-08-23 ENCOUNTER — Encounter (HOSPITAL_COMMUNITY): Payer: Medicare Other

## 2014-08-23 ENCOUNTER — Ambulatory Visit: Payer: Medicare Other | Admitting: Family

## 2014-08-25 ENCOUNTER — Encounter (HOSPITAL_COMMUNITY): Payer: Medicare Other

## 2014-08-25 ENCOUNTER — Ambulatory Visit: Payer: Medicare Other | Admitting: Family

## 2014-09-01 DIAGNOSIS — E119 Type 2 diabetes mellitus without complications: Secondary | ICD-10-CM | POA: Diagnosis not present

## 2014-09-08 ENCOUNTER — Encounter: Payer: Self-pay | Admitting: Internal Medicine

## 2014-09-20 DIAGNOSIS — H25813 Combined forms of age-related cataract, bilateral: Secondary | ICD-10-CM | POA: Diagnosis not present

## 2014-09-20 DIAGNOSIS — E11329 Type 2 diabetes mellitus with mild nonproliferative diabetic retinopathy without macular edema: Secondary | ICD-10-CM | POA: Diagnosis not present

## 2014-10-04 ENCOUNTER — Telehealth: Payer: Self-pay | Admitting: Internal Medicine

## 2014-10-04 ENCOUNTER — Encounter: Payer: Self-pay | Admitting: Gastroenterology

## 2014-10-04 ENCOUNTER — Ambulatory Visit: Payer: Medicare Other | Admitting: Gastroenterology

## 2014-10-04 NOTE — Telephone Encounter (Signed)
PATIENT WAS A NO SHOW AND LETTER WAS SENT  °

## 2014-10-04 NOTE — Telephone Encounter (Signed)
No show X 3 since 03/2014.

## 2014-10-14 ENCOUNTER — Encounter: Payer: Self-pay | Admitting: Family

## 2014-10-18 ENCOUNTER — Ambulatory Visit (INDEPENDENT_AMBULATORY_CARE_PROVIDER_SITE_OTHER): Payer: Medicare Other | Admitting: Family

## 2014-10-18 ENCOUNTER — Ambulatory Visit (HOSPITAL_COMMUNITY)
Admission: RE | Admit: 2014-10-18 | Discharge: 2014-10-18 | Disposition: A | Discharge status: Home / Self Care | Payer: Medicare Other | Source: Ambulatory Visit | Attending: Family | Admitting: Family

## 2014-10-18 ENCOUNTER — Encounter: Payer: Self-pay | Admitting: Family

## 2014-10-18 DIAGNOSIS — I999 Unspecified disorder of circulatory system: Secondary | ICD-10-CM

## 2014-10-18 DIAGNOSIS — Z720 Tobacco use: Secondary | ICD-10-CM | POA: Diagnosis not present

## 2014-10-18 DIAGNOSIS — Z9889 Other specified postprocedural states: Secondary | ICD-10-CM

## 2014-10-18 DIAGNOSIS — E1159 Type 2 diabetes mellitus with other circulatory complications: Secondary | ICD-10-CM | POA: Diagnosis not present

## 2014-10-18 DIAGNOSIS — I739 Peripheral vascular disease, unspecified: Secondary | ICD-10-CM | POA: Insufficient documentation

## 2014-10-18 DIAGNOSIS — Z87891 Personal history of nicotine dependence: Secondary | ICD-10-CM

## 2014-10-18 DIAGNOSIS — E1151 Type 2 diabetes mellitus with diabetic peripheral angiopathy without gangrene: Secondary | ICD-10-CM

## 2014-10-18 DIAGNOSIS — Z789 Other specified health status: Secondary | ICD-10-CM | POA: Insufficient documentation

## 2014-10-18 DIAGNOSIS — M79672 Pain in left foot: Secondary | ICD-10-CM

## 2014-10-18 DIAGNOSIS — Z95828 Presence of other vascular implants and grafts: Secondary | ICD-10-CM

## 2014-10-18 NOTE — Progress Notes (Signed)
[facility] [place] HISTORY AND PHYSICAL -PAD  History of Present Illness Logan [name] is a 69 y.o. male patient of Dr. Kellie Simmering had an aortobifemoral bypass graft with occlusion of the right limb. He also has a left-to-right femoral-femoral bypass graft on 12/20/11. He had a history of a draining sinus from the left inguinal area. This closed without any evidence of residual infection. Patient developed a scrotal abscess apparently at his 2013 visit and had a small draining sinus lateral to the right inguinal incision, this has healed. He is status post left axillary to profunda femoris bypass using 8 mm Hemashield Dacron graft on 06/17/14, and left lateral foot amputation by Dr. Ninfa Linden on 06/19/14.  Dr. Blanch Media is his podiatrist.  He has claudication in both calves with walking about a block, relieved by rest; he has been working out regularly in a gym. He returns today for 3 months follow up with ABI's. He has scant drainage from a pin hole sized area at well healed abdominal incision when he works out.  Pt denies any history of stroke or TIA. He has cardiac stents, did not experience any symptoms of an MI, but was told that he had an MI.  Pt Diabetic: Yes, states his home glucose readings are around 120; states his last A1C was 7.4 Pt smoker: resumed smoking cigars since his grand daughter died unexpectedly (1 cigar every 2-4 days, stopped cigarettes in 7450)  His 61 year old grand daughter died of an MI a few days ago, was 7 months pregnant.   Pt meds include: Statin :Yes ASA: Yes Other anticoagulants/antiplatelets: clopidogrel, prescribed by Dr. Brigitte Pulse for his PAD, per pt   Past Medical History  Diagnosis Date  . Overweight(278.02)   . Dyslipidemia   . Cardiomyopathy   . PVD (peripheral vascular disease)     right to left fem -fem bypass 1997/ aortabifemoral bypass 2005 right to left fem-fem 07/2004. Secondary to occluded righ tlimb of aortobifemoral bypass  .  Tobacco abuse   . Anginal pain   . Hypertension   . Shortness of breath   . Coronary artery disease     taxis DES stent circumflex 02/2003.... residual total distal circumflex and 50% LAD/ cardiolite 08/2006 inferior scar no ischemia  . Degenerative disc disease, lumbar   . Coronary artery disease   . Myocardial infarction   . Diabetes mellitus     type 2  . History of blood transfusion   . Cataracts, bilateral     Social History History  Substance Use Topics  . Smoking status: Former Smoker -- 40 years    Types: Cigars    Quit date: 04/20/2014  . Smokeless tobacco: Never Used  . Alcohol Use: 0.0 oz/week    0 Standard drinks or equivalent per week     Comment: liquior sometimes    Family History Family History  Problem Relation Age of Onset  . Hypertension Mother   . Varicose Veins Mother   . Hypertension Brother     Past Surgical History  Procedure Laterality Date  . Knee surgery    . Axillary-femoral bypass graft  12/20/2011    Procedure: BYPASS GRAFT AXILLA-BIFEMORAL;  Surgeon: Conrad Rockford, MD;  Location: [place];  Service: Vascular;  Laterality: Bilateral;  Left aorta bilateral femoral thrombectomy, femoral to femoral thrombectomy, and revision of Femoral to femoral graft. and Thrombectomy of Left tibial artery.  . Fasciotomy  12/20/2011    Procedure: FASCIOTOMY;  Surgeon: Jannette Fogo  Bridgett Larsson, MD;  Location: [facility] OR;  Service: Vascular;  Laterality: Right;  right calf facsicotomy.  . Lower extremity angiogram  12/20/2011    Procedure: LOWER EXTREMITY ANGIOGRAM;  Surgeon: Conrad Alice, MD;  Location: [place];  Service: Vascular;  Laterality: Bilateral;  Intraoperative Abdominal Aortogram with bilateral runoff  . Left-to-right femoral-femoral bypass graft  07-30-2004  DR LAWSON    OCCLUSION RIGHT LIMB AORTOBIFEMORAL BYPASS GRAFT  W/ SEVERE CLAUDICATION  . Aorto to right common femoral and left profunda femoris bypass graft  09-28-2003 DR LAWSON    SEVERE AORTOILIAC OCCLUSIVE DISEASE W/  OCCLUDED FEM-FEM BYPASS OF BOTH LOWER EXTREMITIES  . Right lateral parotidectomy with facial verve preservation  02-25-2001  DR [facility]    NEOPLASM RIGHT PAROTID GLAND  . Coronary angioplasty with stent placement  02-23-2003  DR [facility]    PCI W/ STENTING PROXIMAL CIRCUMFLEX  . Femoral-femoral bypass graft  1997  DR LAWSON    RIGHT TO LEFT  . Debridement leg  12/25/11  . Incision and drainage of wound  02/17/2012    Procedure: IRRIGATION AND DEBRIDEMENT WOUND;  Surgeon: Theodoro Kos, DO;  Location: [place];  Service: Plastics;  Laterality: Bilateral;  . Lumbar laminectomy/decompression microdiscectomy  06/02/2012    Procedure: LUMBAR LAMINECTOMY/DECOMPRESSION MICRODISCECTOMY 1 LEVEL;  Surgeon: Tobi Bastos, MD;  [facility];  Service: Orthopedics;  Laterality: N/A;  Central Decompression Lumbar Laminectomy L5-S1   . Spine surgery    . Ilecolonscopy  05/05/07    RMR:ana; papilla otherwise normal rectum pancolonic diverticula  . Colonoscopy    . Axillary-femoral bypass graft Left 06/17/2014    Procedure:  LEFT AXILLA- LEFT FEMORAL ARTERTY BYPASS GRAFT;  Surgeon: Mal Misty, MD;  Location: [place];  Service: Vascular;  Laterality: Left;  . Amputation Left 06/19/2014    Procedure: Fifth toe amputaion, MPT Joint;  Surgeon: Mcarthur Rossetti, MD;  Location: [place];  Service: Orthopedics;  Laterality: Left;    Allergies  Allergen Reactions  . Aspirin Hives    Tolerates low dose aspirin  . Penicillins Rash and Other (See Comments)    Immune system shut down     Current Outpatient Prescriptions  Medication Sig Dispense Refill  . amLODipine (NORVASC) 10 MG tablet Take 10 mg by mouth daily.     Marland Kitchen aspirin EC 81 MG tablet Take 81 mg by mouth daily.     . clopidogrel (PLAVIX) 75 MG tablet Take 75 mg by mouth daily.     Marland Kitchen glimepiride (AMARYL) 4 MG tablet Take 4 mg by mouth 2 (two) times daily.     . indomethacin (INDOCIN) 50 MG capsule Take 50 mg by mouth 2 (two)  times daily as needed (gout flare-up).     . insulin aspart (NOVOLOG) 100 UNIT/ML injection CBG 70 - 120: 0 units CBG 121 - 150: 2 units CBG 151 - 200: 3 units CBG 201 - 250: 5 units CBG 251 - 300: 8 units  Call MD if CBg is greater than 200 1 vial 0  . insulin glargine (LANTUS) 100 UNIT/ML injection Inject 50 Units into the skin 2 (two) times daily.     . insulin lispro (HUMALOG) 100 UNIT/ML injection Inject 10 Units into the skin 3 (three) times daily before meals.    . insulin lispro (HUMALOG) 100 UNIT/ML injection Inject 10 Units into the skin 3 (three) times daily before meals.    Marland Kitchen lisinopril (PRINIVIL,ZESTRIL) 40 MG tablet Take 40 mg by mouth  at bedtime.     . metFORMIN (GLUCOPHAGE-XR) 500 MG 24 hr tablet Take 1,000 mg by mouth 2 (two) times daily.     . metoprolol succinate (TOPROL-XL) 25 MG 24 hr tablet daily.    . Multiple Vitamin (MULTIVITAMIN WITH MINERALS) TABS tablet Take 1 tablet by mouth daily.    . mupirocin ointment (BACTROBAN) 2 % Intranasally twice daily for 7 days with antibiotic wash 30 g 2  . Omega-3 Fatty Acids (FISH OIL) 1000 MG CAPS Take 2,000 mg by mouth 2 (two) times daily.     . ONE TOUCH ULTRA TEST test strip 3 (three) times daily. for testing  0  . simvastatin (ZOCOR) 20 MG tablet Take 20 mg by mouth at bedtime.     . methocarbamol (ROBAXIN) 500 MG tablet Take 1 tablet (500 mg total) by mouth every 6 (six) hours as needed. (Patient not taking: Reported on 10/18/2014) 40 tablet 1  . oxyCODONE (OXY IR/ROXICODONE) 5 MG immediate release tablet Take 1 tablet (5 mg total) by mouth every 8 (eight) hours as needed for severe pain. (Patient not taking: Reported on 10/18/2014) 30 tablet 0  . polyethylene glycol (MIRALAX / GLYCOLAX) packet Take 17 g by mouth daily as needed. (Patient not taking: Reported on 10/18/2014) 14 each 0  . saccharomyces boulardii (FLORASTOR) 250 MG capsule Take 1 capsule (250 mg total) by mouth 2 (two) times daily. (Patient not taking: Reported on  06/08/2014) 1 capsule 30  . silver sulfADIAZINE (SILVADENE) 1 % cream Apply 1 application topically daily as needed (wound care).      No current facility-administered medications for this visit.    ROS: See HPI for pertinent positives and negatives.   Physical Examination  Filed Vitals:   10/18/14 1156  BP: 142/80  Pulse: 92  Resp: 18  Height: 5\' 9"  (1.753 m)  Weight: 221 lb (100.245 kg)  SpO2: 98%   Body mass index is 32.62 kg/(m^2).  General: A&O x 3, WDWN, obese male. Gait: normal Eyes: Pupils are equal Pulmonary:Respirations are non labored. Cardiac: regular Rythm , without detected murmur.     Carotid Bruits Right Left   Negative Negative  Aorta is not palpable. Radial pulses: absent right radial, palpable left radial, palpable right brachial pulse.   VASCULAR EXAM: Extremities with no ischemic changes. Palpable left axillary to profunda femoris bypass graft. Surgically absent left 5th toe to the MTP joint. No open wounds.  Pin hole sized area of scant bloody drainage from old well healed incision at mid pubis, no erythema,  No swelling, no open wounds, no gangrene.      LE Pulses Right Left   FEMORAL not palpable  palpable    POPLITEAL not palpable  not palpable   POSTERIOR TIBIAL not palpable not palpable    DORSALIS PEDIS  ANTERIOR TIBIAL 1+ palpable 1+ palpable   PERONEAL not Palpable   not Palpable    Abdomen: soft, NT, no masses palpated. Skin: no rashes, see extremities. Musculoskeletal: no muscle wasting or atrophy. See Extremities.  Neurologic: A&O X 3; Appropriate Affect, MOTOR FUNCTION: moving all extremities equally, motor strength 4/5 throughout. Speech is fluent/normal. CN 2-12 grossly  intact.          Non-Invasive Vascular Imaging: DATE: 10/18/2014 ABI: RIGHT: 0.52 (0.42, 02/15/14), Waveforms: monophasic, TBI: 0.29;  LEFT: 0.67 (0.44), TBI: 0.36, Waveforms: monophasic   ASSESSMENT: SELESTINO NILA is a 69 y.o. male who had an aortobifemoral bypass graft with occlusion of the right limb. He  also has a left-to-right femoral-femoral bypass graft on 12/20/11. He had a history of a draining sinus from the left inguinal area. This closed without any evidence of residual infection. Patient developed a scrotal abscess apparently at his 2013 visit and had a small draining sinus lateral to the right inguinal incision, this has healed. He is status post left axillary to profunda femoris bypass using 8 mm Hemashield Dacron graft on 06/17/14, and left lateral foot amputation by Dr. Ninfa Linden on 06/19/14.  Dr. Kellie Simmering spoke with pt and examined him. Scant drainage from small mid pubis healed incision is not close to left ax-fem graft and seems more from a mild suture inflammation, possible previous drain site.   Improvement in bilateral ABI's since he has been walking and exercising, no tissue loss.  His DM is almost in control, he has resumed some cigar smoking since his grand daughter died unexpectedly a few days ago; he is encouraged to curtail that as best he can.   PLAN:  Continue graduated walking program.  I discussed in depth with the patient the nature of atherosclerosis, and emphasized the importance of maximal medical management including strict control of blood pressure, blood glucose, and lipid levels, obtaining regular exercise, and cessation of smoking.  The patient is aware that without maximal medical management the underlying atherosclerotic disease process will progress, limiting the benefit of any interventions.  Based on the patient's vascular studies and examination, pt will return to clinic in 3 months with ABI's according to surveillance timeline.   The patient was  given information about PAD including signs, symptoms, treatment, what symptoms should prompt the patient to seek immediate medical care, and risk reduction measures to take.  Logan Chambers, RN, MSN, FNP-C [facility] Phone: [phone]  Clinic MD: Kellie Simmering  10/18/2014 12:10 PM

## 2014-10-18 NOTE — Patient Instructions (Signed)

## 2014-10-18 NOTE — Addendum Note (Signed)
Addended by: Mena Goes on: 10/18/2014 01:55 PM   Modules accepted: Orders

## 2014-10-21 ENCOUNTER — Ambulatory Visit: Payer: Medicare Other | Admitting: Gastroenterology

## 2014-11-08 DIAGNOSIS — I2510 Atherosclerotic heart disease of native coronary artery without angina pectoris: Secondary | ICD-10-CM | POA: Diagnosis not present

## 2014-11-08 DIAGNOSIS — E780 Pure hypercholesterolemia: Secondary | ICD-10-CM | POA: Diagnosis not present

## 2014-11-08 DIAGNOSIS — Z6833 Body mass index (BMI) 33.0-33.9, adult: Secondary | ICD-10-CM | POA: Diagnosis not present

## 2014-11-08 DIAGNOSIS — E668 Other obesity: Secondary | ICD-10-CM | POA: Diagnosis not present

## 2014-11-08 DIAGNOSIS — E119 Type 2 diabetes mellitus without complications: Secondary | ICD-10-CM | POA: Diagnosis not present

## 2014-11-08 DIAGNOSIS — J449 Chronic obstructive pulmonary disease, unspecified: Secondary | ICD-10-CM | POA: Diagnosis not present

## 2014-11-14 ENCOUNTER — Other Ambulatory Visit: Payer: Self-pay

## 2015-01-16 ENCOUNTER — Encounter: Payer: Self-pay | Admitting: Family

## 2015-01-17 ENCOUNTER — Encounter (HOSPITAL_COMMUNITY): Payer: Medicare Other

## 2015-01-17 ENCOUNTER — Ambulatory Visit: Payer: Medicare Other | Admitting: Family

## 2015-01-31 DIAGNOSIS — Z886 Allergy status to analgesic agent status: Secondary | ICD-10-CM | POA: Diagnosis not present

## 2015-01-31 DIAGNOSIS — R238 Other skin changes: Secondary | ICD-10-CM | POA: Diagnosis not present

## 2015-01-31 DIAGNOSIS — M1990 Unspecified osteoarthritis, unspecified site: Secondary | ICD-10-CM | POA: Diagnosis not present

## 2015-01-31 DIAGNOSIS — E119 Type 2 diabetes mellitus without complications: Secondary | ICD-10-CM | POA: Diagnosis not present

## 2015-01-31 DIAGNOSIS — I739 Peripheral vascular disease, unspecified: Secondary | ICD-10-CM | POA: Diagnosis not present

## 2015-01-31 DIAGNOSIS — Z79899 Other long term (current) drug therapy: Secondary | ICD-10-CM | POA: Diagnosis not present

## 2015-01-31 DIAGNOSIS — Z823 Family history of stroke: Secondary | ICD-10-CM | POA: Diagnosis not present

## 2015-01-31 DIAGNOSIS — L0231 Cutaneous abscess of buttock: Secondary | ICD-10-CM | POA: Diagnosis not present

## 2015-01-31 DIAGNOSIS — Z8249 Family history of ischemic heart disease and other diseases of the circulatory system: Secondary | ICD-10-CM | POA: Diagnosis not present

## 2015-01-31 DIAGNOSIS — Z8589 Personal history of malignant neoplasm of other organs and systems: Secondary | ICD-10-CM | POA: Diagnosis not present

## 2015-01-31 DIAGNOSIS — L728 Other follicular cysts of the skin and subcutaneous tissue: Secondary | ICD-10-CM | POA: Diagnosis not present

## 2015-01-31 DIAGNOSIS — I10 Essential (primary) hypertension: Secondary | ICD-10-CM | POA: Diagnosis not present

## 2015-01-31 DIAGNOSIS — Z880 Allergy status to penicillin: Secondary | ICD-10-CM | POA: Diagnosis not present

## 2015-01-31 DIAGNOSIS — Z7982 Long term (current) use of aspirin: Secondary | ICD-10-CM | POA: Diagnosis not present

## 2015-01-31 DIAGNOSIS — Z794 Long term (current) use of insulin: Secondary | ICD-10-CM | POA: Diagnosis not present

## 2015-01-31 DIAGNOSIS — Z833 Family history of diabetes mellitus: Secondary | ICD-10-CM | POA: Diagnosis not present

## 2015-02-27 ENCOUNTER — Encounter: Payer: Self-pay | Admitting: Family

## 2015-02-28 NOTE — Telephone Encounter (Signed)
I called patient's home phone number no answer and voicemail hasn't been setup yet. Called patient's cell phone number no answer, but was able to leave message to call me back .Need to verify information on where to send records .

## 2015-03-20 ENCOUNTER — Other Ambulatory Visit: Payer: Self-pay

## 2015-03-20 ENCOUNTER — Encounter: Payer: Self-pay | Admitting: Gastroenterology

## 2015-03-20 ENCOUNTER — Ambulatory Visit (INDEPENDENT_AMBULATORY_CARE_PROVIDER_SITE_OTHER): Payer: Medicare Other | Admitting: Gastroenterology

## 2015-03-20 VITALS — BP 120/71 | HR 82 | Temp 98.0°F | Ht 69.0 in | Wt 223.8 lb

## 2015-03-20 DIAGNOSIS — Z86010 Personal history of colonic polyps: Secondary | ICD-10-CM | POA: Diagnosis not present

## 2015-03-20 MED ORDER — PEG 3350-KCL-NA BICARB-NACL 420 G PO SOLR: 4000.0000 mL | Freq: Once | ORAL | Status: DC

## 2015-03-20 NOTE — Patient Instructions (Signed)
1. Colonoscopy with Dr. Rourk. Please see separate instructions. 

## 2015-03-20 NOTE — Progress Notes (Signed)
cc'ed to pcp °

## 2015-03-20 NOTE — Progress Notes (Signed)
Primary Care Physician:  Monico Blitz, MD  Primary Gastroenterologist:  Garfield Cornea, MD   Chief Complaint  Patient presents with  . OTHER    set up colonoscoy/ hx of polyps    HPI:  Logan [name] is a 69 y.o. male here to schedule surveillance colonoscopy. His last colonoscopy was in 2008. He had a 5 mm and 7 mm pedunculated tubular adenoma removed from the mid descending colon. No family history of colon cancer. Due back for surveillance colonoscopy at this time. He is on Plavix and aspirin with extensive history of CAD/PVD. With regards to GI symptoms, he is doing well. Bowel movement daily. No blood in the stool or melena. Denies abdominal pain. Appetite good. No unintentional weight loss. No heartburn or indigestion. No dysphagia, vomiting.  Current Outpatient Prescriptions  Medication Sig Dispense Refill  . amLODipine (NORVASC) 10 MG tablet Take 10 mg by mouth daily.     Marland Kitchen aspirin EC 81 MG tablet Take 81 mg by mouth daily.     . clopidogrel (PLAVIX) 75 MG tablet Take 75 mg by mouth daily.     Marland Kitchen glimepiride (AMARYL) 4 MG tablet Take 4 mg by mouth 2 (two) times daily.     . indomethacin (INDOCIN) 50 MG capsule Take 50 mg by mouth 2 (two) times daily as needed (gout flare-up).     . insulin glargine (LANTUS) 100 UNIT/ML injection Inject 50 Units into the skin 2 (two) times daily.     . insulin lispro (HUMALOG) 100 UNIT/ML injection Inject 10 Units into the skin 3 (three) times daily before meals. Sliding scale    . lisinopril (PRINIVIL,ZESTRIL) 40 MG tablet Take 40 mg by mouth at bedtime.     . metFORMIN (GLUCOPHAGE-XR) 500 MG 24 hr tablet Take 1,000 mg by mouth 2 (two) times daily.     . metoprolol succinate (TOPROL-XL) 25 MG 24 hr tablet daily.    . Multiple Vitamin (MULTIVITAMIN WITH MINERALS) TABS tablet Take 1 tablet by mouth daily.    . Omega-3 Fatty Acids (FISH OIL) 1000 MG CAPS Take 2,000 mg by mouth 2 (two) times daily.     . simvastatin (ZOCOR) 20 MG tablet Take 20 mg by mouth  at bedtime.     . insulin aspart (NOVOLOG) 100 UNIT/ML injection CBG 70 - 120: 0 units CBG 121 - 150: 2 units CBG 151 - 200: 3 units CBG 201 - 250: 5 units CBG 251 - 300: 8 units  Call MD if CBg is greater than 200 1 vial 0  . ONE TOUCH ULTRA TEST test strip 3 (three) times daily. for testing  0   No current facility-administered medications for this visit.    Allergies as of 03/20/2015 - Review Complete 03/20/2015  Allergen Reaction Noted  . Aspirin Hives   . Penicillins Rash and Other (See Comments)     Past Medical History  Diagnosis Date  . Overweight(278.02)   . Dyslipidemia   . Cardiomyopathy ([place])   . PVD (peripheral vascular disease) ([place])     right to left fem -fem bypass 1997/ aortabifemoral bypass 2005 right to left fem-fem 07/2004. Secondary to occluded righ tlimb of aortobifemoral bypass  . Tobacco abuse   . Anginal pain ([place])   . Hypertension   . Shortness of breath   . Coronary artery disease     taxis DES stent circumflex 02/2003.... residual total distal circumflex and 50% LAD/ cardiolite 08/2006 inferior scar no ischemia  . Degenerative disc disease, lumbar   .  Coronary artery disease   . Myocardial infarction ([place])   . Diabetes mellitus     type 2  . History of blood transfusion   . Cataracts, bilateral     Past Surgical History  Procedure Laterality Date  . Knee surgery    . Axillary-femoral bypass graft  12/20/2011    Procedure: BYPASS GRAFT AXILLA-BIFEMORAL;  Surgeon: Conrad [place]ville, MD;  Location: [place];  Service: Vascular;  Laterality: Bilateral;  Left aorta bilateral femoral thrombectomy, femoral to femoral thrombectomy, and revision of Femoral to femoral graft. and Thrombectomy of Left tibial artery.  . Fasciotomy  12/20/2011    Procedure: FASCIOTOMY;  Surgeon: Conrad Lacey, MD;  Location: [place];  Service: Vascular;  Laterality: Right;  right calf facsicotomy.  . Lower extremity angiogram  12/20/2011    Procedure: LOWER EXTREMITY ANGIOGRAM;  Surgeon:  Conrad Glenpool, MD;  Location: [place];  Service: Vascular;  Laterality: Bilateral;  Intraoperative Abdominal Aortogram with bilateral runoff  . Left-to-right femoral-femoral bypass graft  07-30-2004  DR LAWSON    OCCLUSION RIGHT LIMB AORTOBIFEMORAL BYPASS GRAFT  W/ SEVERE CLAUDICATION  . Aorto to right common femoral and left profunda femoris bypass graft  09-28-2003 DR LAWSON    SEVERE AORTOILIAC OCCLUSIVE DISEASE W/ OCCLUDED FEM-FEM BYPASS OF BOTH LOWER EXTREMITIES  . Right lateral parotidectomy with facial verve preservation  02-25-2001  DR [facility]    NEOPLASM RIGHT PAROTID GLAND  . Coronary angioplasty with stent placement  02-23-2003  DR [facility]    PCI W/ STENTING PROXIMAL CIRCUMFLEX  . Femoral-femoral bypass graft  1997  DR LAWSON    RIGHT TO LEFT  . Debridement leg  12/25/11  . Incision and drainage of wound  02/17/2012    Procedure: IRRIGATION AND DEBRIDEMENT WOUND;  Surgeon: Theodoro Kos, DO;  Location: [place];  Service: Plastics;  Laterality: Bilateral;  . Lumbar laminectomy/decompression microdiscectomy  06/02/2012    Procedure: LUMBAR LAMINECTOMY/DECOMPRESSION MICRODISCECTOMY 1 LEVEL;  Surgeon: Tobi Bastos, MD;  [facility];  Service: Orthopedics;  Laterality: N/A;  Central Decompression Lumbar Laminectomy L5-S1   . Spine surgery    . Ilecolonscopy  05/05/07    RMR:ana; papilla otherwise normal rectum pancolonic diverticula. two pedunculated polyps removed, tubular adenoma  . Colonoscopy    . Axillary-femoral bypass graft Left 06/17/2014    Procedure:  LEFT AXILLA- LEFT FEMORAL ARTERTY BYPASS GRAFT;  Surgeon: Mal Misty, MD;  Location: [place];  Service: Vascular;  Laterality: Left;  . Amputation Left 06/19/2014    Procedure: Fifth toe amputaion, MPT Joint;  Surgeon: Mcarthur Rossetti, MD;  Location: [place];  Service: Orthopedics;  Laterality: Left;    Family History  Problem Relation Age of Onset  . Hypertension Mother   . Varicose Veins Mother    . Hypertension Brother   . Colon cancer Neg Hx     Social History   Social History  . Marital Status: Married    Spouse Name: N/A  . Number of Children: N/A  . Years of Education: N/A   Occupational History  . Not on file.   Social History Main Topics  . Smoking status: Former Smoker -- 40 years    Types: Cigars    Quit date: 04/20/2014  . Smokeless tobacco: Never Used     Comment: Quit x 2 years off and on  . Alcohol Use: 0.0 oz/week    0 Standard drinks or equivalent per week     Comment: liquior  sometimes, seldom  . Drug Use: No  . Sexual Activity: Not on file   Other Topics Concern  . Not on file   Social History Narrative      ROS: patient reported negative ros  General: Negative for anorexia, weight loss, fever, chills, fatigue, weakness. Eyes: Negative for vision changes.  ENT: Negative for hoarseness, difficulty swallowing , nasal congestion. CV: Negative for chest pain, angina, palpitations, dyspnea on exertion, peripheral edema.  Respiratory: Negative for dyspnea at rest, dyspnea on exertion, cough, sputum, wheezing.  GI: See history of present illness. GU:  Negative for dysuria, hematuria, urinary incontinence, urinary frequency, nocturnal urination.  MS: Negative for joint pain, low back pain.  Derm: Negative for rash or itching.  Neuro: Negative for weakness, abnormal sensation, seizure, frequent headaches, memory loss, confusion.  Psych: Negative for anxiety, depression, suicidal ideation, hallucinations.  Endo: Negative for unusual weight change.  Heme: Negative for bruising or bleeding. Allergy: Negative for rash or hives.    Physical Examination:  BP 120/71 mmHg  Pulse 82  Temp(Src) 98 F (36.7 C) (Oral)  Ht 5\' 9"  (1.753 m)  Wt 223 lb 12.8 oz (101.515 kg)  BMI 33.03 kg/m2   General: Well-nourished, well-developed in no acute distress. Very limited conversation. Flat affect. Head: Normocephalic, atraumatic.   Eyes: Conjunctiva pink, no  icterus. Mouth: Oropharyngeal mucosa moist and pink , no lesions erythema or exudate. Neck: Supple without thyromegaly, masses, or lymphadenopathy.  Lungs: Clear to auscultation bilaterally.  Heart: Regular rate and rhythm, no murmurs rubs or gallops.  Abdomen: Bowel sounds are normal, nontender, nondistended, no hepatosplenomegaly or masses, no abdominal bruits or    hernia , no rebound or guarding.   Rectal: deferred Extremities: No lower extremity edema. No clubbing or deformities.  Neuro: Alert and oriented x 4 , grossly normal neurologically.  Skin: Warm and dry, no rash or jaundice.   Psych: Alert and cooperative, normal mood and flat affect.  Labs: Lab Results  Component Value Date   WBC 12.3* 06/18/2014   HGB 10.4* 06/18/2014   HCT 32.0* 06/18/2014   MCV 88.9 06/18/2014   PLT 125* 06/18/2014   Lab Results  Component Value Date   CREATININE 1.19 06/18/2014   BUN 9 06/18/2014   NA 135 06/18/2014   K 3.8 06/18/2014   CL 105 06/18/2014   CO2 22 06/18/2014   Lab Results  Component Value Date   ALT 17 06/10/2014   AST 23 06/10/2014   ALKPHOS 87 06/10/2014   BILITOT 0.3 06/10/2014   No results found for: IRON, TIBC, FERRITIN   Imaging Studies: No results found.

## 2015-03-20 NOTE — Assessment & Plan Note (Signed)
69 year old gentleman with significant CAD/PVD on aspirin and Plavix who presents for surveillance colonoscopy for history of adenomatous colon polyps. Last colonoscopy 2008. No GI symptoms.  I have discussed the risks, alternatives, benefits with regards to but not limited to the risk of reaction to medication, bleeding, infection, perforation and the patient is agreeable to proceed. Written consent to be obtained.  Continue Plavix/ASA.

## 2015-03-28 ENCOUNTER — Ambulatory Visit (HOSPITAL_COMMUNITY)
Admission: RE | Admit: 2015-03-28 | Discharge: 2015-03-28 | Disposition: A | Discharge status: Home / Self Care | Payer: Medicare Other | Source: Ambulatory Visit | Attending: Internal Medicine | Admitting: Internal Medicine

## 2015-03-28 ENCOUNTER — Encounter (HOSPITAL_COMMUNITY)
Admission: RE | Disposition: A | Discharge status: Home / Self Care | Payer: Self-pay | Source: Ambulatory Visit | Attending: Internal Medicine

## 2015-03-28 ENCOUNTER — Encounter (HOSPITAL_COMMUNITY): Payer: Self-pay | Admitting: *Deleted

## 2015-03-28 DIAGNOSIS — Z79899 Other long term (current) drug therapy: Secondary | ICD-10-CM | POA: Insufficient documentation

## 2015-03-28 DIAGNOSIS — E785 Hyperlipidemia, unspecified: Secondary | ICD-10-CM | POA: Insufficient documentation

## 2015-03-28 DIAGNOSIS — Z794 Long term (current) use of insulin: Secondary | ICD-10-CM | POA: Insufficient documentation

## 2015-03-28 DIAGNOSIS — Z1211 Encounter for screening for malignant neoplasm of colon: Secondary | ICD-10-CM | POA: Insufficient documentation

## 2015-03-28 DIAGNOSIS — D123 Benign neoplasm of transverse colon: Secondary | ICD-10-CM | POA: Insufficient documentation

## 2015-03-28 DIAGNOSIS — Z86010 Personal history of colonic polyps: Secondary | ICD-10-CM

## 2015-03-28 DIAGNOSIS — I2510 Atherosclerotic heart disease of native coronary artery without angina pectoris: Secondary | ICD-10-CM | POA: Insufficient documentation

## 2015-03-28 DIAGNOSIS — Z7982 Long term (current) use of aspirin: Secondary | ICD-10-CM | POA: Diagnosis not present

## 2015-03-28 DIAGNOSIS — Z7984 Long term (current) use of oral hypoglycemic drugs: Secondary | ICD-10-CM | POA: Insufficient documentation

## 2015-03-28 DIAGNOSIS — E119 Type 2 diabetes mellitus without complications: Secondary | ICD-10-CM | POA: Insufficient documentation

## 2015-03-28 DIAGNOSIS — I10 Essential (primary) hypertension: Secondary | ICD-10-CM | POA: Insufficient documentation

## 2015-03-28 DIAGNOSIS — I429 Cardiomyopathy, unspecified: Secondary | ICD-10-CM | POA: Insufficient documentation

## 2015-03-28 DIAGNOSIS — K648 Other hemorrhoids: Secondary | ICD-10-CM | POA: Insufficient documentation

## 2015-03-28 DIAGNOSIS — K635 Polyp of colon: Secondary | ICD-10-CM | POA: Diagnosis not present

## 2015-03-28 DIAGNOSIS — E663 Overweight: Secondary | ICD-10-CM | POA: Insufficient documentation

## 2015-03-28 HISTORY — PX: COLONOSCOPY: SHX5424

## 2015-03-28 LAB — GLUCOSE, CAPILLARY: Glucose-Capillary: 157 mg/dL — ABNORMAL HIGH (ref 65–99)

## 2015-03-28 SURGERY — COLONOSCOPY
Anesthesia: Moderate Sedation

## 2015-03-28 MED ORDER — ONDANSETRON HCL 4 MG/2ML IJ SOLN
INTRAMUSCULAR | Status: DC | PRN
Start: 2015-03-28 — End: 2015-03-28
  Administered 2015-03-28: 4 mg via INTRAVENOUS

## 2015-03-28 MED ORDER — MIDAZOLAM HCL 5 MG/5ML IJ SOLN
INTRAMUSCULAR | Status: AC
  Filled 2015-03-28: qty 10

## 2015-03-28 MED ORDER — SODIUM CHLORIDE 0.9 % IV SOLN
INTRAVENOUS | Status: DC
  Administered 2015-03-28: 09:00:00 via INTRAVENOUS

## 2015-03-28 MED ORDER — MIDAZOLAM HCL 5 MG/5ML IJ SOLN
INTRAMUSCULAR | Status: DC | PRN
Start: 2015-03-28 — End: 2015-03-28
  Administered 2015-03-28 (×2): 2 mg via INTRAVENOUS

## 2015-03-28 MED ORDER — STERILE WATER FOR IRRIGATION IR SOLN
Status: DC | PRN
  Administered 2015-03-28: 09:00:00

## 2015-03-28 MED ORDER — MEPERIDINE HCL 100 MG/ML IJ SOLN
INTRAMUSCULAR | Status: AC
  Filled 2015-03-28: qty 2

## 2015-03-28 MED ORDER — ONDANSETRON HCL 4 MG/2ML IJ SOLN
INTRAMUSCULAR | Status: AC
  Filled 2015-03-28: qty 2

## 2015-03-28 MED ORDER — MEPERIDINE HCL 100 MG/ML IJ SOLN
INTRAMUSCULAR | Status: DC | PRN
  Administered 2015-03-28: 25 mg via INTRAVENOUS
  Administered 2015-03-28: 50 mg via INTRAVENOUS

## 2015-03-28 NOTE — Op Note (Signed)
[facility] [address] [place], [address]   COLONOSCOPY PROCEDURE REPORT  PATIENT: Stevin, Bielinski  MR#: [id number] BIRTHDATE: April 19, 1946 , 69  yrs. old GENDER: male ENDOSCOPIST: R.  Garfield Cornea, MD FACP [facility] REFERRED SP:[id number] Manuella Ghazi, M.D. PROCEDURE DATE:  04-19-2015 PROCEDURE:   Colonoscopy with snare polypectomy INDICATIONS:Surveillance examination; history of colonic adenoma. MEDICATIONS: Versed 4 mg IV and Demerol 75 mg IV in divided doses. Zofran 4 mg IV. ASA CLASS:       Class II  CONSENT: The risks, benefits, alternatives and imponderables including but not limited to bleeding, perforation as well as the possibility of a missed lesion have been reviewed.  The potential for biopsy, lesion removal, etc. have also been discussed. Questions have been answered.  All parties agreeable.  Please see the history and physical in the medical record for more information.  DESCRIPTION OF PROCEDURE:   After the risks benefits and alternatives of the procedure were thoroughly explained, informed consent was obtained.  The digital rectal exam revealed no abnormalities of the rectum.   The EC-[id number] ([id number])  endoscope was introduced through the anus and advanced to the cecum, which was identified by both the appendix and ileocecal valve. No adverse events experienced.   The quality of the prep was adequate  The instrument was then slowly withdrawn as the colon was fully examined. Estimated blood loss is zero unless otherwise noted in this procedure report.      COLON FINDINGS: Anal papilla and internal hemorrhoids; otherwise, normal-appearing rectal mucosa.  (1) 6 mm polyp at the splenic flexure; otherwise, the remainder of the colonic mucosa appeared normal.  Retroflexion was performed. . The above-mentioned problems cold snare removed. EBL 2 mL Withdrawal time=11 minutes 0 seconds.  The scope was withdrawn and the procedure completed. COMPLICATIONS: There  were no immediate complications.  ENDOSCOPIC IMPRESSION: Single colonic polyp?"removed as described above  RECOMMENDATIONS: Follow up on pathology.  eSigned:  R. Garfield Cornea, MD FACP [facility] 19-Apr-2015 10:00 AM   cc:  CPT CODES: ICD CODES:  The ICD and CPT codes recommended by this software are interpretations from the data that the clinical staff has captured with the software.  The verification of the translation of this report to the ICD and CPT codes and modifiers is the sole responsibility of the health care institution and practicing physician where this report was generated.  [employer] will not be held responsible for the validity of the ICD and CPT codes included on this report.  AMA assumes no liability for data contained or not contained herein. CPT is a Designer, television/film set of the [employer].  PATIENT NAME:  Quindarrius, [place] MR#: [id number]

## 2015-03-28 NOTE — Interval H&P Note (Signed)
History and Physical Interval Note:  03/28/2015 9:23 AM  Logan [name]  has presented today for surgery, with the diagnosis of history of colon polyps  The various methods of treatment have been discussed with the patient and family. After consideration of risks, benefits and other options for treatment, the patient has consented to  Procedure(s) with comments: COLONOSCOPY (N/A) - 0900 - moved to 9:30 - office to notify as a surgical intervention .  The patient's history has been reviewed, patient examined, no change in status, stable for surgery.  I have reviewed the patient's chart and labs.  Questions were answered to the patient's satisfaction.     Evertte Sones  No change. Surveillance colonoscopy per plan.  The risks, benefits, limitations, alternatives and imponderables have been reviewed with the patient. Questions have been answered. All parties are agreeable.

## 2015-03-28 NOTE — H&P (View-Only) (Signed)
Primary Care Physician:  Monico Blitz, MD  Primary Gastroenterologist:  Garfield Cornea, MD   Chief Complaint  Patient presents with  . OTHER    set up colonoscoy/ hx of polyps    HPI:  Logan [name] is a 69 y.o. male here to schedule surveillance colonoscopy. His last colonoscopy was in 2008. He had a 5 mm and 7 mm pedunculated tubular adenoma removed from the mid descending colon. No family history of colon cancer. Due back for surveillance colonoscopy at this time. He is on Plavix and aspirin with extensive history of CAD/PVD. With regards to GI symptoms, he is doing well. Bowel movement daily. No blood in the stool or melena. Denies abdominal pain. Appetite good. No unintentional weight loss. No heartburn or indigestion. No dysphagia, vomiting.  Current Outpatient Prescriptions  Medication Sig Dispense Refill  . amLODipine (NORVASC) 10 MG tablet Take 10 mg by mouth daily.     Marland Kitchen aspirin EC 81 MG tablet Take 81 mg by mouth daily.     . clopidogrel (PLAVIX) 75 MG tablet Take 75 mg by mouth daily.     Marland Kitchen glimepiride (AMARYL) 4 MG tablet Take 4 mg by mouth 2 (two) times daily.     . indomethacin (INDOCIN) 50 MG capsule Take 50 mg by mouth 2 (two) times daily as needed (gout flare-up).     . insulin glargine (LANTUS) 100 UNIT/ML injection Inject 50 Units into the skin 2 (two) times daily.     . insulin lispro (HUMALOG) 100 UNIT/ML injection Inject 10 Units into the skin 3 (three) times daily before meals. Sliding scale    . lisinopril (PRINIVIL,ZESTRIL) 40 MG tablet Take 40 mg by mouth at bedtime.     . metFORMIN (GLUCOPHAGE-XR) 500 MG 24 hr tablet Take 1,000 mg by mouth 2 (two) times daily.     . metoprolol succinate (TOPROL-XL) 25 MG 24 hr tablet daily.    . Multiple Vitamin (MULTIVITAMIN WITH MINERALS) TABS tablet Take 1 tablet by mouth daily.    . Omega-3 Fatty Acids (FISH OIL) 1000 MG CAPS Take 2,000 mg by mouth 2 (two) times daily.     . simvastatin (ZOCOR) 20 MG tablet Take 20 mg by mouth  at bedtime.     . insulin aspart (NOVOLOG) 100 UNIT/ML injection CBG 70 - 120: 0 units CBG 121 - 150: 2 units CBG 151 - 200: 3 units CBG 201 - 250: 5 units CBG 251 - 300: 8 units  Call MD if CBg is greater than 200 1 vial 0  . ONE TOUCH ULTRA TEST test strip 3 (three) times daily. for testing  0   No current facility-administered medications for this visit.    Allergies as of 03/20/2015 - Review Complete 03/20/2015  Allergen Reaction Noted  . Aspirin Hives   . Penicillins Rash and Other (See Comments)     Past Medical History  Diagnosis Date  . Overweight(278.02)   . Dyslipidemia   . Cardiomyopathy ([place])   . PVD (peripheral vascular disease) ([place])     right to left fem -fem bypass 1997/ aortabifemoral bypass 2005 right to left fem-fem 07/2004. Secondary to occluded righ tlimb of aortobifemoral bypass  . Tobacco abuse   . Anginal pain ([place])   . Hypertension   . Shortness of breath   . Coronary artery disease     taxis DES stent circumflex 02/2003.... residual total distal circumflex and 50% LAD/ cardiolite 08/2006 inferior scar no ischemia  . Degenerative disc disease, lumbar   .  Coronary artery disease   . Myocardial infarction ([place])   . Diabetes mellitus     type 2  . History of blood transfusion   . Cataracts, bilateral     Past Surgical History  Procedure Laterality Date  . Knee surgery    . Axillary-femoral bypass graft  12/20/2011    Procedure: BYPASS GRAFT AXILLA-BIFEMORAL;  Surgeon: Conrad Bristol, MD;  Location: [place];  Service: Vascular;  Laterality: Bilateral;  Left aorta bilateral femoral thrombectomy, femoral to femoral thrombectomy, and revision of Femoral to femoral graft. and Thrombectomy of Left tibial artery.  . Fasciotomy  12/20/2011    Procedure: FASCIOTOMY;  Surgeon: Conrad [place], MD;  Location: [place];  Service: Vascular;  Laterality: Right;  right calf facsicotomy.  . Lower extremity angiogram  12/20/2011    Procedure: LOWER EXTREMITY ANGIOGRAM;  Surgeon:  Conrad , MD;  Location: [place];  Service: Vascular;  Laterality: Bilateral;  Intraoperative Abdominal Aortogram with bilateral runoff  . Left-to-right femoral-femoral bypass graft  07-30-2004  DR LAWSON    OCCLUSION RIGHT LIMB AORTOBIFEMORAL BYPASS GRAFT  W/ SEVERE CLAUDICATION  . Aorto to right common femoral and left profunda femoris bypass graft  09-28-2003 DR LAWSON    SEVERE AORTOILIAC OCCLUSIVE DISEASE W/ OCCLUDED FEM-FEM BYPASS OF BOTH LOWER EXTREMITIES  . Right lateral parotidectomy with facial verve preservation  02-25-2001  DR [facility]    NEOPLASM RIGHT PAROTID GLAND  . Coronary angioplasty with stent placement  02-23-2003  DR [facility]    PCI W/ STENTING PROXIMAL CIRCUMFLEX  . Femoral-femoral bypass graft  1997  DR LAWSON    RIGHT TO LEFT  . Debridement leg  12/25/11  . Incision and drainage of wound  02/17/2012    Procedure: IRRIGATION AND DEBRIDEMENT WOUND;  Surgeon: Theodoro Kos, DO;  Location: [place];  Service: Plastics;  Laterality: Bilateral;  . Lumbar laminectomy/decompression microdiscectomy  06/02/2012    Procedure: LUMBAR LAMINECTOMY/DECOMPRESSION MICRODISCECTOMY 1 LEVEL;  Surgeon: Tobi Bastos, MD;  Location: [facility];  Service: Orthopedics;  Laterality: N/A;  Central Decompression Lumbar Laminectomy L5-S1   . Spine surgery    . Ilecolonscopy  05/05/07    RMR:ana; papilla otherwise normal rectum pancolonic diverticula. two pedunculated polyps removed, tubular adenoma  . Colonoscopy    . Axillary-femoral bypass graft Left 06/17/2014    Procedure:  LEFT AXILLA- LEFT FEMORAL ARTERTY BYPASS GRAFT;  Surgeon: Mal Misty, MD;  Location: [place];  Service: Vascular;  Laterality: Left;  . Amputation Left 06/19/2014    Procedure: Fifth toe amputaion, MPT Joint;  Surgeon: Mcarthur Rossetti, MD;  Location: [place];  Service: Orthopedics;  Laterality: Left;    Family History  Problem Relation Age of Onset  . Hypertension Mother   . Varicose Veins Mother    . Hypertension Brother   . Colon cancer Neg Hx     Social History   Social History  . Marital Status: Married    Spouse Name: N/A  . Number of Children: N/A  . Years of Education: N/A   Occupational History  . Not on file.   Social History Main Topics  . Smoking status: Former Smoker -- 40 years    Types: Cigars    Quit date: 04/20/2014  . Smokeless tobacco: Never Used     Comment: Quit x 2 years off and on  . Alcohol Use: 0.0 oz/week    0 Standard drinks or equivalent per week     Comment: liquior  sometimes, seldom  . Drug Use: No  . Sexual Activity: Not on file   Other Topics Concern  . Not on file   Social History Narrative      ROS: patient reported negative ros  General: Negative for anorexia, weight loss, fever, chills, fatigue, weakness. Eyes: Negative for vision changes.  ENT: Negative for hoarseness, difficulty swallowing , nasal congestion. CV: Negative for chest pain, angina, palpitations, dyspnea on exertion, peripheral edema.  Respiratory: Negative for dyspnea at rest, dyspnea on exertion, cough, sputum, wheezing.  GI: See history of present illness. GU:  Negative for dysuria, hematuria, urinary incontinence, urinary frequency, nocturnal urination.  MS: Negative for joint pain, low back pain.  Derm: Negative for rash or itching.  Neuro: Negative for weakness, abnormal sensation, seizure, frequent headaches, memory loss, confusion.  Psych: Negative for anxiety, depression, suicidal ideation, hallucinations.  Endo: Negative for unusual weight change.  Heme: Negative for bruising or bleeding. Allergy: Negative for rash or hives.    Physical Examination:  BP 120/71 mmHg  Pulse 82  Temp(Src) 98 F (36.7 C) (Oral)  Ht 5\' 9"  (1.753 m)  Wt 223 lb 12.8 oz (101.515 kg)  BMI 33.03 kg/m2   General: Well-nourished, well-developed in no acute distress. Very limited conversation. Flat affect. Head: Normocephalic, atraumatic.   Eyes: Conjunctiva pink, no  icterus. Mouth: Oropharyngeal mucosa moist and pink , no lesions erythema or exudate. Neck: Supple without thyromegaly, masses, or lymphadenopathy.  Lungs: Clear to auscultation bilaterally.  Heart: Regular rate and rhythm, no murmurs rubs or gallops.  Abdomen: Bowel sounds are normal, nontender, nondistended, no hepatosplenomegaly or masses, no abdominal bruits or    hernia , no rebound or guarding.   Rectal: deferred Extremities: No lower extremity edema. No clubbing or deformities.  Neuro: Alert and oriented x 4 , grossly normal neurologically.  Skin: Warm and dry, no rash or jaundice.   Psych: Alert and cooperative, normal mood and flat affect.  Labs: Lab Results  Component Value Date   WBC 12.3* 06/18/2014   HGB 10.4* 06/18/2014   HCT 32.0* 06/18/2014   MCV 88.9 06/18/2014   PLT 125* 06/18/2014   Lab Results  Component Value Date   CREATININE 1.19 06/18/2014   BUN 9 06/18/2014   NA 135 06/18/2014   K 3.8 06/18/2014   CL 105 06/18/2014   CO2 22 06/18/2014   Lab Results  Component Value Date   ALT 17 06/10/2014   AST 23 06/10/2014   ALKPHOS 87 06/10/2014   BILITOT 0.3 06/10/2014   No results found for: IRON, TIBC, FERRITIN   Imaging Studies: No results found.

## 2015-03-28 NOTE — Discharge Instructions (Signed)

## 2015-03-29 ENCOUNTER — Encounter: Payer: Self-pay | Admitting: Internal Medicine

## 2015-04-03 ENCOUNTER — Encounter (HOSPITAL_COMMUNITY): Payer: Self-pay | Admitting: Internal Medicine

## 2015-04-04 ENCOUNTER — Encounter: Payer: Self-pay | Admitting: Family

## 2015-04-17 ENCOUNTER — Encounter: Payer: Self-pay | Admitting: Vascular Surgery

## 2015-04-19 ENCOUNTER — Ambulatory Visit (INDEPENDENT_AMBULATORY_CARE_PROVIDER_SITE_OTHER): Payer: Medicare Other | Admitting: Family

## 2015-04-19 ENCOUNTER — Ambulatory Visit (HOSPITAL_COMMUNITY)
Admission: RE | Admit: 2015-04-19 | Discharge: 2015-04-19 | Disposition: A | Discharge status: Home / Self Care | Payer: Medicare Other | Source: Ambulatory Visit | Attending: Family | Admitting: Family

## 2015-04-19 ENCOUNTER — Encounter: Payer: Self-pay | Admitting: Family

## 2015-04-19 VITALS — BP 135/77 | HR 80 | Temp 98.1°F | Resp 16 | Ht 69.0 in | Wt 228.0 lb

## 2015-04-19 DIAGNOSIS — E1151 Type 2 diabetes mellitus with diabetic peripheral angiopathy without gangrene: Secondary | ICD-10-CM | POA: Diagnosis not present

## 2015-04-19 DIAGNOSIS — Z95828 Presence of other vascular implants and grafts: Secondary | ICD-10-CM | POA: Insufficient documentation

## 2015-04-19 DIAGNOSIS — Z87891 Personal history of nicotine dependence: Secondary | ICD-10-CM

## 2015-04-19 DIAGNOSIS — Z720 Tobacco use: Secondary | ICD-10-CM | POA: Insufficient documentation

## 2015-04-19 DIAGNOSIS — Z48812 Encounter for surgical aftercare following surgery on the circulatory system: Secondary | ICD-10-CM | POA: Diagnosis not present

## 2015-04-19 DIAGNOSIS — I739 Peripheral vascular disease, unspecified: Secondary | ICD-10-CM | POA: Diagnosis not present

## 2015-04-19 NOTE — Patient Instructions (Signed)
Peripheral Vascular Disease Peripheral vascular disease (PVD) is a disease of the blood vessels that are not part of your heart and brain. A simple term for PVD is poor circulation. In most cases, PVD narrows the blood vessels that carry blood from your heart to the rest of your body. This can result in a decreased supply of blood to your arms, legs, and internal organs, like your stomach or kidneys. However, it most often affects a person's lower legs and feet. There are two types of PVD.  Organic PVD. This is the more common type. It is caused by damage to the structure of blood vessels.  Functional PVD. This is caused by conditions that make blood vessels contract and tighten (spasm). Without treatment, PVD tends to get worse over time. PVD can also lead to acute ischemic limb. This is when an arm or limb suddenly has trouble getting enough blood. This is a medical emergency. CAUSES Each type of PVD has many different causes. The most common cause of PVD is buildup of a fatty material (plaque) inside of your arteries (atherosclerosis). Small amounts of plaque can break off from the walls of the blood vessels and become lodged in a smaller artery. This blocks blood flow and can cause acute ischemic limb. Other common causes of PVD include:  Blood clots that form inside of blood vessels.  Injuries to blood vessels.  Diseases that cause inflammation of blood vessels or cause blood vessel spasms.  Health behaviors and health history that increase your risk of developing PVD. RISK FACTORS  You may have a greater risk of PVD if you:  Have a family history of PVD.  Have certain medical conditions, including:  High cholesterol.  Diabetes.  High blood pressure (hypertension).  Coronary heart disease.  Past problems with blood clots.  Past injury, such as burns or a broken bone. These may have damaged blood vessels in your limbs.  Buerger disease. This is caused by inflamed blood  vessels in your hands and feet.  Some forms of arthritis.  Rare birth defects that affect the arteries in your legs.  Use tobacco.  Do not get enough exercise.  Are obese.  Are age 50 or older. SIGNS AND SYMPTOMS  PVD may cause many different symptoms. Your symptoms depend on what part of your body is not getting enough blood. Some common signs and symptoms include:  Cramps in your lower legs. This may be a symptom of poor leg circulation (claudication).  Pain and weakness in your legs while you are physically active that goes away when you rest (intermittent claudication).  Leg pain when at rest.  Leg numbness, tingling, or weakness.  Coldness in a leg or foot, especially when compared with the other leg.  Skin or hair changes. These can include:  Hair loss.  Shiny skin.  Pale or bluish skin.  Thick toenails.  Inability to get or maintain an erection (erectile dysfunction). People with PVD are more prone to developing ulcers and sores on their toes, feet, or legs. These may take longer than normal to heal. DIAGNOSIS Your health care provider may diagnose PVD from your signs and symptoms. The health care provider will also do a physical exam. You may have tests to find out what is causing your PVD and determine its severity. Tests may include:  Blood pressure recordings from your arms and legs and measurements of the strength of your pulses (pulse volume recordings).  Imaging studies using sound waves to take pictures of   the blood flow through your blood vessels (Doppler ultrasound).  Injecting a dye into your blood vessels before having imaging studies using:  X-rays (angiogram or arteriogram).  Computer-generated X-rays (CT angiogram).  A powerful electromagnetic field and a computer (magnetic resonance angiogram or MRA). TREATMENT Treatment for PVD depends on the cause of your condition and the severity of your symptoms. It also depends on your age. Underlying  causes need to be treated and controlled. These include long-lasting (chronic) conditions, such as diabetes, high cholesterol, and high blood pressure. You may need to first try making lifestyle changes and taking medicines. Surgery may be needed if these do not work. Lifestyle changes may include:  Quitting smoking.  Exercising regularly.  Following a low-fat, low-cholesterol diet. Medicines may include:  Blood thinners to prevent blood clots.  Medicines to improve blood flow.  Medicines to improve your blood cholesterol levels. Surgical procedures may include:  A procedure that uses an inflated balloon to open a blocked artery and improve blood flow (angioplasty).  A procedure to put in a tube (stent) to keep a blocked artery open (stent implant).  Surgery to reroute blood flow around a blocked artery (peripheral bypass surgery).  Surgery to remove dead tissue from an infected wound on the affected limb.  Amputation. This is surgical removal of the affected limb. This may be necessary in cases of acute ischemic limb that are not improved through medical or surgical treatments. HOME CARE INSTRUCTIONS  Take medicines only as directed by your health care provider.  Do not use any tobacco products, including cigarettes, chewing tobacco, or electronic cigarettes. If you need help quitting, ask your health care provider.  Lose weight if you are overweight, and maintain a healthy weight as directed by your health care provider.  Eat a diet that is low in fat and cholesterol. If you need help, ask your health care provider.  Exercise regularly. Ask your health care provider to suggest some good activities for you.  Use compression stockings or other mechanical devices as directed by your health care provider.  Take good care of your feet.  Wear comfortable shoes that fit well.  Check your feet often for any cuts or sores. SEEK MEDICAL CARE IF:  You have cramps in your legs  while walking.  You have leg pain when you are at rest.  You have coldness in a leg or foot.  Your skin changes.  You have erectile dysfunction.  You have cuts or sores on your feet that are not healing. SEEK IMMEDIATE MEDICAL CARE IF:  Your arm or leg turns cold and blue.  Your arms or legs become red, warm, swollen, painful, or numb.  You have chest pain or trouble breathing.  You suddenly have weakness in your face, arm, or leg.  You become very confused or lose the ability to speak.  You suddenly have a very bad headache or lose your vision.   This information is not intended to replace advice given to you by your health care provider. Make sure you discuss any questions you have with your health care provider.   Document Released: 06/13/2004 Document Revised: 05/27/2014 Document Reviewed: 10/14/2013 Elsevier Interactive Patient Education 2016 [employer]  

## 2015-04-19 NOTE — Progress Notes (Signed)
[facility] [place] HISTORY AND PHYSICAL -PAD  History of Present Illness Logan [name] is a 69 y.o. male patient of Dr. Kellie Simmering had an aortobifemoral bypass graft with occlusion of the right limb. He also has a left-to-right femoral-femoral bypass graft  on 12/20/11. He had a history of a draining sinus from the left inguinal area. This closed without any evidence of residual infection. Patient developed a scrotal abscess apparently at his 2013 visit and had a small draining sinus lateral to the right inguinal incision, this has healed. He is status post left axillary to profunda femoris bypass using 8 mm Hemashield Dacron graft on 06/17/14, and left lateral foot amputation by Dr. Ninfa Linden on 06/19/14. He returns today for routine follow up of his PAD.  Dr. Blanch Media is his podiatrist.  He has claudication in both calves with walking about a block, relieved by rest. He has right hip pain after walking about a block; he stopped working out regularly in a gym, hopes to resume.  He has recurrent ulcers in both groins, states his PCP is aware and that he was told by a surgeon that debrided these ulcers that they would recur.  Pt denies any history of stroke or TIA. He has cardiac stents, did not experience any symptoms of an MI, but was told that he had an MI.  Pt Diabetic: Yes, states his home glucose readings are around 120; states his last A1C was 7.4 Pt smoker: resumed smoking cigars since his grand daughter died unexpected lyin 2014-10-15 (1 cigar every 2-4 days, stopped cigarettes in 2012)  Pt meds include: Statin :Yes ASA: Yes Other anticoagulants/antiplatelets: clopidogrel, prescribed by Dr. Brigitte Pulse for his PAD, per pt     Past Medical History  Diagnosis Date  . Overweight(278.02)   . Dyslipidemia   . Cardiomyopathy ([place])   . PVD (peripheral vascular disease) ([place])     right to left fem -fem bypass 1997/ aortabifemoral bypass 2005 right to left fem-fem 07/2004.  Secondary to occluded righ tlimb of aortobifemoral bypass  . Tobacco abuse   . Anginal pain ([place])   . Hypertension   . Shortness of breath   . Coronary artery disease     taxis DES stent circumflex 02/2003.... residual total distal circumflex and 50% LAD/ cardiolite 08/2006 inferior scar no ischemia  . Degenerative disc disease, lumbar   . Coronary artery disease   . Myocardial infarction ([place])   . Diabetes mellitus     type 2  . History of blood transfusion   . Cataracts, bilateral     Social History Social History  Substance Use Topics  . Smoking status: Former Smoker -- 40 years    Types: Cigars    Quit date: 04/20/2014  . Smokeless tobacco: Never Used     Comment: Quit x 2 years off and on  . Alcohol Use: 0.0 oz/week    0 Standard drinks or equivalent per week     Comment: liquior sometimes, seldom    Family History Family History  Problem Relation Age of Onset  . Hypertension Mother   . Varicose Veins Mother   . Hypertension Brother   . Colon cancer Neg Hx     Past Surgical History  Procedure Laterality Date  . Knee surgery    . Axillary-femoral bypass graft  12/20/2011    Procedure: BYPASS GRAFT AXILLA-BIFEMORAL;  Surgeon: Conrad Macy, MD;  Location: [place];  Service: Vascular;  Laterality: Bilateral;  Left aorta  bilateral femoral thrombectomy, femoral to femoral thrombectomy, and revision of Femoral to femoral graft. and Thrombectomy of Left tibial artery.  . Fasciotomy  12/20/2011    Procedure: FASCIOTOMY;  Surgeon: Conrad Clayton, MD;  Location: [place];  Service: Vascular;  Laterality: Right;  right calf facsicotomy.  . Lower extremity angiogram  12/20/2011    Procedure: LOWER EXTREMITY ANGIOGRAM;  Surgeon: Conrad Larchwood, MD;  Location: [place];  Service: Vascular;  Laterality: Bilateral;  Intraoperative Abdominal Aortogram with bilateral runoff  . Left-to-right femoral-femoral bypass graft  07-30-2004  DR LAWSON    OCCLUSION RIGHT LIMB AORTOBIFEMORAL BYPASS GRAFT  W/  SEVERE CLAUDICATION  . Aorto to right common femoral and left profunda femoris bypass graft  09-28-2003 DR LAWSON    SEVERE AORTOILIAC OCCLUSIVE DISEASE W/ OCCLUDED FEM-FEM BYPASS OF BOTH LOWER EXTREMITIES  . Right lateral parotidectomy with facial verve preservation  02-25-2001  DR [facility]    NEOPLASM RIGHT PAROTID GLAND  . Coronary angioplasty with stent placement  02-23-2003  DR [facility]    PCI W/ STENTING PROXIMAL CIRCUMFLEX  . Femoral-femoral bypass graft  1997  DR LAWSON    RIGHT TO LEFT  . Debridement leg  12/25/11  . Incision and drainage of wound  02/17/2012    Procedure: IRRIGATION AND DEBRIDEMENT WOUND;  Surgeon: Theodoro Kos, DO;  Location: [place];  Service: Plastics;  Laterality: Bilateral;  . Lumbar laminectomy/decompression microdiscectomy  06/02/2012    Procedure: LUMBAR LAMINECTOMY/DECOMPRESSION MICRODISCECTOMY 1 LEVEL;  Surgeon: Tobi Bastos, MD;  [facility];  Service: Orthopedics;  Laterality: N/A;  Central Decompression Lumbar Laminectomy L5-S1   . Spine surgery    . Ilecolonscopy  05/05/07    RMR:ana; papilla otherwise normal rectum pancolonic diverticula. two pedunculated polyps removed, tubular adenoma  . Colonoscopy    . Axillary-femoral bypass graft Left 06/17/2014    Procedure:  LEFT AXILLA- LEFT FEMORAL ARTERTY BYPASS GRAFT;  Surgeon: Mal Misty, MD;  Location: [place];  Service: Vascular;  Laterality: Left;  . Amputation Left 06/19/2014    Procedure: Fifth toe amputaion, MPT Joint;  Surgeon: Mcarthur Rossetti, MD;  Location: [place];  Service: Orthopedics;  Laterality: Left;  . Colonoscopy N/A 03/28/2015    Procedure: COLONOSCOPY;  Surgeon: Daneil Dolin, MD;  [facility];  Service: Endoscopy;  Laterality: N/A;  0900 - moved to 9:30 - office to notify    Allergies  Allergen Reactions  . Aspirin Hives    Tolerates low dose aspirin  . Penicillins Rash and Other (See Comments)    Immune system shut down     Current  Outpatient Prescriptions  Medication Sig Dispense Refill  . amLODipine (NORVASC) 10 MG tablet Take 10 mg by mouth daily.     Marland Kitchen aspirin EC 81 MG tablet Take 81 mg by mouth daily.     . clopidogrel (PLAVIX) 75 MG tablet Take 75 mg by mouth daily.     Marland Kitchen glimepiride (AMARYL) 4 MG tablet Take 4 mg by mouth 2 (two) times daily.     . indomethacin (INDOCIN) 50 MG capsule Take 50 mg by mouth 2 (two) times daily as needed (gout flare-up).     . insulin aspart (NOVOLOG) 100 UNIT/ML injection CBG 70 - 120: 0 units CBG 121 - 150: 2 units CBG 151 - 200: 3 units CBG 201 - 250: 5 units CBG 251 - 300: 8 units  Call MD if CBg is greater than 200 1 vial 0  .  insulin glargine (LANTUS) 100 UNIT/ML injection Inject 50 Units into the skin 2 (two) times daily.     . insulin lispro (HUMALOG) 100 UNIT/ML injection Inject 10 Units into the skin 3 (three) times daily before meals. Sliding scale    . lisinopril (PRINIVIL,ZESTRIL) 40 MG tablet Take 40 mg by mouth at bedtime.     . metFORMIN (GLUCOPHAGE-XR) 500 MG 24 hr tablet Take 1,000 mg by mouth 2 (two) times daily.     . metoprolol succinate (TOPROL-XL) 25 MG 24 hr tablet daily.    . Multiple Vitamin (MULTIVITAMIN WITH MINERALS) TABS tablet Take 1 tablet by mouth daily.    . Omega-3 Fatty Acids (FISH OIL) 1000 MG CAPS Take 2,000 mg by mouth 2 (two) times daily.     . ONE TOUCH ULTRA TEST test strip 3 (three) times daily. for testing  0  . polyethylene glycol-electrolytes (NULYTELY/GOLYTELY) 420 G solution Take 4,000 mLs by mouth once. 4000 mL 0  . simvastatin (ZOCOR) 20 MG tablet Take 20 mg by mouth at bedtime.      No current facility-administered medications for this visit.    ROS: See HPI for pertinent positives and negatives.   Physical Examination  Filed Vitals:   04/19/15 1146  BP: 135/77  Pulse: 80  Temp: 98.1 F (36.7 C)  TempSrc: Oral  Resp: 16  Height: 5\' 9"  (1.753 m)  Weight: 228 lb (103.42 kg)  SpO2: 97%   Body mass index is 33.65  kg/(m^2).   General: A&O x 3, WDWN, obese male. Gait: normal Eyes: Pupils are equal Pulmonary:Respirations are non labored. Cardiac: regular rhythm, no detected murmur.     Carotid Bruits Right Left   Negative Negative  Aorta is not palpable. Radial pulses: absent right radial, palpable left radial, palpable right brachial pulse.    VASCULAR EXAM: Extremities with no ischemic changes. Palpable left axillary to profunda femoris bypass graft. Surgically absent left 5th toe to the MTP joint. No open wounds.      LE Pulses Right Left   FEMORAL not palpable palpable    POPLITEAL not palpable  not palpable   POSTERIOR TIBIAL not palpable not palpable    DORSALIS PEDIS  ANTERIOR TIBIAL 1+ palpable 1+ palpable   PERONEAL not Palpable   not Palpable    Abdomen: soft, NT, no masses palpated. Skin: no rashes, see extremities. Musculoskeletal: no muscle wasting or atrophy. See Extremities.  Neurologic: A&O X 3; Appropriate Affect, MOTOR FUNCTION: moving all extremities equally, motor strength 4/5 throughout. Speech is fluent/normal. CN 2-12 grossly intact.                 Non-Invasive Vascular Imaging: DATE: 04/19/2015 ABI: RIGHT: 0.49 (0.52, 10/18/14), TBI: 0.35, Waveforms: monophasic;  LEFT: 0.75 (0.67), Waveforms: monophasic, TBI: 0.41   ASSESSMENT: Logan [name] is a 69 y.o. male who is s/p aortobifemoral bypass graft with occlusion of the right limb. He also has a left-to-right femoral-femoral bypass graft on 12/20/11. He had a history of a draining sinus from the left inguinal area. This closed without any evidence of residual infection. Patient developed a scrotal abscess apparently at his 2013 visit  and had a small draining sinus lateral to the right inguinal incision, this has healed. He is status post left axillary to profunda femoris bypass using 8 mm Hemashield Dacron graft on 06/17/14, and left lateral foot amputation by Dr. Ninfa Linden on 06/19/14. His bilateral claudication is moderate, not life limiting. He has no signs of ischemia in  his feet/legs. ABI's remain stable with severe arterial occlusive disease in the right LE and moderate in the left LE. He admits to not trying to exercise or walk much lately.  His DM seems almost in control.     PLAN:  Graduated walking program discussed.  Based on the patient's vascular studies and examination, pt will return to clinic in 3 months with ABI's according to post-operative surveillance timeline.  I discussed in depth with the patient the nature of atherosclerosis, and emphasized the importance of maximal medical management including strict control of blood pressure, blood glucose, and lipid levels, obtaining regular exercise, and cessation of smoking.  The patient is aware that without maximal medical management the underlying atherosclerotic disease process will progress, limiting the benefit of any interventions.  The patient was given information about PAD including signs, symptoms, treatment, what symptoms should prompt the patient to seek immediate medical care, and risk reduction measures to take.  Clemon Chambers, RN, MSN, FNP-C [facility] Phone: [phone]  Clinic MD: Scot Dock  04/19/2015 11:45 AM

## 2015-07-28 ENCOUNTER — Encounter: Payer: Self-pay | Admitting: Family

## 2015-08-01 ENCOUNTER — Encounter: Payer: Self-pay | Admitting: Family

## 2015-08-01 ENCOUNTER — Ambulatory Visit (HOSPITAL_COMMUNITY)
Admission: RE | Admit: 2015-08-01 | Discharge: 2015-08-01 | Disposition: A | Discharge status: Home / Self Care | Payer: Medicare Other | Source: Ambulatory Visit | Attending: Family | Admitting: Family

## 2015-08-01 ENCOUNTER — Ambulatory Visit (INDEPENDENT_AMBULATORY_CARE_PROVIDER_SITE_OTHER): Payer: Medicare Other | Admitting: Family

## 2015-08-01 VITALS — BP 120/67 | HR 108 | Temp 97.6°F | Resp 16 | Ht 69.0 in | Wt 221.0 lb

## 2015-08-01 DIAGNOSIS — I779 Disorder of arteries and arterioles, unspecified: Secondary | ICD-10-CM | POA: Diagnosis not present

## 2015-08-01 DIAGNOSIS — E1151 Type 2 diabetes mellitus with diabetic peripheral angiopathy without gangrene: Secondary | ICD-10-CM | POA: Diagnosis not present

## 2015-08-01 DIAGNOSIS — R0989 Other specified symptoms and signs involving the circulatory and respiratory systems: Secondary | ICD-10-CM | POA: Diagnosis present

## 2015-08-01 DIAGNOSIS — E785 Hyperlipidemia, unspecified: Secondary | ICD-10-CM | POA: Insufficient documentation

## 2015-08-01 DIAGNOSIS — Z87891 Personal history of nicotine dependence: Secondary | ICD-10-CM

## 2015-08-01 DIAGNOSIS — I10 Essential (primary) hypertension: Secondary | ICD-10-CM | POA: Insufficient documentation

## 2015-08-01 DIAGNOSIS — Z48812 Encounter for surgical aftercare following surgery on the circulatory system: Secondary | ICD-10-CM | POA: Diagnosis not present

## 2015-08-01 DIAGNOSIS — Z720 Tobacco use: Secondary | ICD-10-CM

## 2015-08-01 DIAGNOSIS — R938 Abnormal findings on diagnostic imaging of other specified body structures: Secondary | ICD-10-CM | POA: Diagnosis not present

## 2015-08-01 DIAGNOSIS — Z95828 Presence of other vascular implants and grafts: Secondary | ICD-10-CM | POA: Insufficient documentation

## 2015-08-01 NOTE — Patient Instructions (Signed)
Peripheral Vascular Disease Peripheral vascular disease (PVD) is a disease of the blood vessels that are not part of your heart and brain. A simple term for PVD is poor circulation. In most cases, PVD narrows the blood vessels that carry blood from your heart to the rest of your body. This can result in a decreased supply of blood to your arms, legs, and internal organs, like your stomach or kidneys. However, it most often affects a person's lower legs and feet. There are two types of PVD.  Organic PVD. This is the more common type. It is caused by damage to the structure of blood vessels.  Functional PVD. This is caused by conditions that make blood vessels contract and tighten (spasm). Without treatment, PVD tends to get worse over time. PVD can also lead to acute ischemic limb. This is when an arm or limb suddenly has trouble getting enough blood. This is a medical emergency. CAUSES Each type of PVD has many different causes. The most common cause of PVD is buildup of a fatty material (plaque) inside of your arteries (atherosclerosis). Small amounts of plaque can break off from the walls of the blood vessels and become lodged in a smaller artery. This blocks blood flow and can cause acute ischemic limb. Other common causes of PVD include:  Blood clots that form inside of blood vessels.  Injuries to blood vessels.  Diseases that cause inflammation of blood vessels or cause blood vessel spasms.  Health behaviors and health history that increase your risk of developing PVD. RISK FACTORS  You may have a greater risk of PVD if you:  Have a family history of PVD.  Have certain medical conditions, including:  High cholesterol.  Diabetes.  High blood pressure (hypertension).  Coronary heart disease.  Past problems with blood clots.  Past injury, such as burns or a broken bone. These may have damaged blood vessels in your limbs.  Buerger disease. This is caused by inflamed blood  vessels in your hands and feet.  Some forms of arthritis.  Rare birth defects that affect the arteries in your legs.  Use tobacco.  Do not get enough exercise.  Are obese.  Are age 50 or older. SIGNS AND SYMPTOMS  PVD may cause many different symptoms. Your symptoms depend on what part of your body is not getting enough blood. Some common signs and symptoms include:  Cramps in your lower legs. This may be a symptom of poor leg circulation (claudication).  Pain and weakness in your legs while you are physically active that goes away when you rest (intermittent claudication).  Leg pain when at rest.  Leg numbness, tingling, or weakness.  Coldness in a leg or foot, especially when compared with the other leg.  Skin or hair changes. These can include:  Hair loss.  Shiny skin.  Pale or bluish skin.  Thick toenails.  Inability to get or maintain an erection (erectile dysfunction). People with PVD are more prone to developing ulcers and sores on their toes, feet, or legs. These may take longer than normal to heal. DIAGNOSIS Your health care provider may diagnose PVD from your signs and symptoms. The health care provider will also do a physical exam. You may have tests to find out what is causing your PVD and determine its severity. Tests may include:  Blood pressure recordings from your arms and legs and measurements of the strength of your pulses (pulse volume recordings).  Imaging studies using sound waves to take pictures of   the blood flow through your blood vessels (Doppler ultrasound).  Injecting a dye into your blood vessels before having imaging studies using:  X-rays (angiogram or arteriogram).  Computer-generated X-rays (CT angiogram).  A powerful electromagnetic field and a computer (magnetic resonance angiogram or MRA). TREATMENT Treatment for PVD depends on the cause of your condition and the severity of your symptoms. It also depends on your age. Underlying  causes need to be treated and controlled. These include long-lasting (chronic) conditions, such as diabetes, high cholesterol, and high blood pressure. You may need to first try making lifestyle changes and taking medicines. Surgery may be needed if these do not work. Lifestyle changes may include:  Quitting smoking.  Exercising regularly.  Following a low-fat, low-cholesterol diet. Medicines may include:  Blood thinners to prevent blood clots.  Medicines to improve blood flow.  Medicines to improve your blood cholesterol levels. Surgical procedures may include:  A procedure that uses an inflated balloon to open a blocked artery and improve blood flow (angioplasty).  A procedure to put in a tube (stent) to keep a blocked artery open (stent implant).  Surgery to reroute blood flow around a blocked artery (peripheral bypass surgery).  Surgery to remove dead tissue from an infected wound on the affected limb.  Amputation. This is surgical removal of the affected limb. This may be necessary in cases of acute ischemic limb that are not improved through medical or surgical treatments. HOME CARE INSTRUCTIONS  Take medicines only as directed by your health care provider.  Do not use any tobacco products, including cigarettes, chewing tobacco, or electronic cigarettes. If you need help quitting, ask your health care provider.  Lose weight if you are overweight, and maintain a healthy weight as directed by your health care provider.  Eat a diet that is low in fat and cholesterol. If you need help, ask your health care provider.  Exercise regularly. Ask your health care provider to suggest some good activities for you.  Use compression stockings or other mechanical devices as directed by your health care provider.  Take good care of your feet.  Wear comfortable shoes that fit well.  Check your feet often for any cuts or sores. SEEK MEDICAL CARE IF:  You have cramps in your legs  while walking.  You have leg pain when you are at rest.  You have coldness in a leg or foot.  Your skin changes.  You have erectile dysfunction.  You have cuts or sores on your feet that are not healing. SEEK IMMEDIATE MEDICAL CARE IF:  Your arm or leg turns cold and blue.  Your arms or legs become red, warm, swollen, painful, or numb.  You have chest pain or trouble breathing.  You suddenly have weakness in your face, arm, or leg.  You become very confused or lose the ability to speak.  You suddenly have a very bad headache or lose your vision.   This information is not intended to replace advice given to you by your health care provider. Make sure you discuss any questions you have with your health care provider.   Document Released: 06/13/2004 Document Revised: 05/27/2014 Document Reviewed: 10/14/2013 Elsevier Interactive Patient Education 2016 [employer]  

## 2015-08-01 NOTE — Progress Notes (Signed)
[facility] [place] HISTORY AND PHYSICAL -PAD  History of Present Illness Logan [name] is a 70 y.o. male patient of Dr. Kellie Simmering had an aortobifemoral bypass graft with occlusion of the right limb. He also has a left-to-right femoral-femoral bypass graft on 12/20/11. He had a history of a draining sinus from the left inguinal area. This closed without any evidence of residual infection. Patient developed a scrotal abscess apparently at his 2013 visit and had a small draining sinus lateral to the right inguinal incision, this had healed and has now recurred. He is status post left axillary to profunda femoris bypass using 8 mm Hemashield Dacron graft on 06/17/14, and left lateral foot amputation by Dr. Ninfa Linden on 06/19/14. He returns today for routine follow up of his PAD.  Dr. Blanch Media is his podiatrist.  He has claudication in both calves with walking about a block, relieved by rest. He has right hip pain after walking about a block; he stopped working out regularly in a gym, hopes to resume.  He has recurrent ulcers in his scrotum, states his PCP is aware and that he was told by a surgeon that debrided these ulcers that they would recur.  Pt denies any history of stroke or TIA. He has cardiac stents, did not experience any symptoms of an MI, but was told that he had an MI.  Pt Diabetic: Yes, states his home glucose readings are averaging 160  Pt smoker: resumed smoking cigars since his grand daughter died unexpected lyin 09-27-14, sister died in 07-28-15 (1 cigar every 2-4 days, stopped cigarettes in 2012)  Pt meds include: Statin :Yes ASA: Yes Other anticoagulants/antiplatelets: clopidogrel, prescribed by Dr. Brigitte Pulse for his PAD, per pt      Past Medical History  Diagnosis Date  . Overweight(278.02)   . Dyslipidemia   . Cardiomyopathy ([place])   . PVD (peripheral vascular disease) ([place])     right to left fem -fem bypass 1997/ aortabifemoral bypass 2005 right to  left fem-fem 07/27/2004. Secondary to occluded righ tlimb of aortobifemoral bypass  . Tobacco abuse   . Anginal pain ([place])   . Hypertension   . Shortness of breath   . Coronary artery disease     taxis DES stent circumflex 02/2003.... residual total distal circumflex and 50% LAD/ cardiolite 08/2006 inferior scar no ischemia  . Degenerative disc disease, lumbar   . Coronary artery disease   . Myocardial infarction ([place])   . Diabetes mellitus     type 2  . History of blood transfusion   . Cataracts, bilateral     Social History Social History  Substance Use Topics  . Smoking status: Former Smoker -- 40 years    Types: Cigars    Quit date: 04/20/2014  . Smokeless tobacco: Never Used     Comment: Quit x 2 years off and on  . Alcohol Use: 0.0 oz/week    0 Standard drinks or equivalent per week     Comment: liquior sometimes, seldom    Family History Family History  Problem Relation Age of Onset  . Hypertension Mother   . Varicose Veins Mother   . Hypertension Brother   . Colon cancer Neg Hx     Past Surgical History  Procedure Laterality Date  . Knee surgery    . Axillary-femoral bypass graft  12/20/2011    Procedure: BYPASS GRAFT AXILLA-BIFEMORAL;  Surgeon: Conrad Sprague, MD;  Location: [place];  Service: Vascular;  Laterality:  Bilateral;  Left aorta bilateral femoral thrombectomy, femoral to femoral thrombectomy, and revision of Femoral to femoral graft. and Thrombectomy of Left tibial artery.  . Fasciotomy  12/20/2011    Procedure: FASCIOTOMY;  Surgeon: Conrad Wellman, MD;  Location: [place];  Service: Vascular;  Laterality: Right;  right calf facsicotomy.  . Lower extremity angiogram  12/20/2011    Procedure: LOWER EXTREMITY ANGIOGRAM;  Surgeon: Conrad , MD;  Location: [place];  Service: Vascular;  Laterality: Bilateral;  Intraoperative Abdominal Aortogram with bilateral runoff  . Left-to-right femoral-femoral bypass graft  07-30-2004  DR [facility]    OCCLUSION RIGHT LIMB AORTOBIFEMORAL  BYPASS GRAFT  W/ SEVERE CLAUDICATION  . Aorto to right common femoral and left profunda femoris bypass graft  09-28-2003 DR LAWSON    SEVERE AORTOILIAC OCCLUSIVE DISEASE W/ OCCLUDED FEM-FEM BYPASS OF BOTH LOWER EXTREMITIES  . Right lateral parotidectomy with facial verve preservation  02-25-2001  DR [facility]    NEOPLASM RIGHT PAROTID GLAND  . Coronary angioplasty with stent placement  02-23-2003  DR [facility]    PCI W/ STENTING PROXIMAL CIRCUMFLEX  . Femoral-femoral bypass graft  1997  DR LAWSON    RIGHT TO LEFT  . Debridement leg  12/25/11  . Incision and drainage of wound  02/17/2012    Procedure: IRRIGATION AND DEBRIDEMENT WOUND;  Surgeon: Theodoro Kos, DO;  Location: [place];  Service: Plastics;  Laterality: Bilateral;  . Lumbar laminectomy/decompression microdiscectomy  06/02/2012    Procedure: LUMBAR LAMINECTOMY/DECOMPRESSION MICRODISCECTOMY 1 LEVEL;  Surgeon: Tobi Bastos, MD;  [facility];  Service: Orthopedics;  Laterality: N/A;  Central Decompression Lumbar Laminectomy L5-S1   . Spine surgery    . Ilecolonscopy  05/05/07    RMR:ana; papilla otherwise normal rectum pancolonic diverticula. two pedunculated polyps removed, tubular adenoma  . Colonoscopy    . Axillary-femoral bypass graft Left 06/17/2014    Procedure:  LEFT AXILLA- LEFT FEMORAL ARTERTY BYPASS GRAFT;  Surgeon: Mal Misty, MD;  Location: [place];  Service: Vascular;  Laterality: Left;  . Amputation Left 06/19/2014    Procedure: Fifth toe amputaion, MPT Joint;  Surgeon: Mcarthur Rossetti, MD;  Location: [place];  Service: Orthopedics;  Laterality: Left;  . Colonoscopy N/A 03/28/2015    Procedure: COLONOSCOPY;  Surgeon: Daneil Dolin, MD;  [facility];  Service: Endoscopy;  Laterality: N/A;  0900 - moved to 9:30 - office to notify    Allergies  Allergen Reactions  . Aspirin Hives    Tolerates low dose aspirin  . Penicillins Rash and Other (See Comments)    Immune system shut down      Current Outpatient Prescriptions  Medication Sig Dispense Refill  . amLODipine (NORVASC) 10 MG tablet Take 10 mg by mouth daily.     Marland Kitchen aspirin EC 81 MG tablet Take 81 mg by mouth daily.     . clopidogrel (PLAVIX) 75 MG tablet Take 75 mg by mouth daily.     Marland Kitchen glimepiride (AMARYL) 4 MG tablet Take 4 mg by mouth 2 (two) times daily.     . insulin aspart (NOVOLOG) 100 UNIT/ML injection CBG 70 - 120: 0 units CBG 121 - 150: 2 units CBG 151 - 200: 3 units CBG 201 - 250: 5 units CBG 251 - 300: 8 units  Call MD if CBg is greater than 200 1 vial 0  . lisinopril (PRINIVIL,ZESTRIL) 40 MG tablet Take 40 mg by mouth at bedtime.     Marland Kitchen  metFORMIN (GLUCOPHAGE-XR) 500 MG 24 hr tablet Take 1,000 mg by mouth 2 (two) times daily.     . metoprolol succinate (TOPROL-XL) 25 MG 24 hr tablet daily.    . Multiple Vitamin (MULTIVITAMIN WITH MINERALS) TABS tablet Take 1 tablet by mouth daily.    . Omega-3 Fatty Acids (FISH OIL) 1000 MG CAPS Take 2,000 mg by mouth 2 (two) times daily.     . ONE TOUCH ULTRA TEST test strip 3 (three) times daily. for testing  0  . simvastatin (ZOCOR) 20 MG tablet Take 20 mg by mouth at bedtime.     . indomethacin (INDOCIN) 50 MG capsule Take 50 mg by mouth 2 (two) times daily as needed (gout flare-up). Reported on 08/01/2015    . insulin glargine (LANTUS) 100 UNIT/ML injection Inject 50 Units into the skin 2 (two) times daily. Reported on 08/01/2015    . insulin lispro (HUMALOG) 100 UNIT/ML injection Inject 10 Units into the skin 3 (three) times daily before meals. Reported on 08/01/2015    . polyethylene glycol-electrolytes (NULYTELY/GOLYTELY) 420 G solution Take 4,000 mLs by mouth once. (Patient not taking: Reported on 04/19/2015) 4000 mL 0   No current facility-administered medications for this visit.    ROS: See HPI for pertinent positives and negatives.   Physical Examination  Filed Vitals:   08/01/15 1123  BP: 120/67  Pulse: 108  Temp: 97.6 F (36.4 C)  Resp: 16   Height: 5\' 9"  (1.753 m)  Weight: 221 lb (100.245 kg)  SpO2: 99%   Body mass index is 32.62 kg/(m^2).  General: A&O x 3, WDWN, obese male. Gait: normal Eyes: Pupils are equal Pulmonary:Respirations are non labored, CTAB. Cardiac: regular rhythm, no detected murmur.     Carotid Bruits Right Left   Negative Negative  Aorta is not palpable. Radial pulses: absent right radial, palpable right ulnar pulse, palpable left radial, palpable right brachial pulse.    VASCULAR EXAM: Extremities with no ischemic changes. Palpable left axillary to profunda femoris bypass graft. Surgically absent left 5th toe to the MTP joint. No open wounds.      LE Pulses Right Left   FEMORAL not palpable palpable    POPLITEAL not palpable  not palpable   POSTERIOR TIBIAL not palpable not palpable    DORSALIS PEDIS  ANTERIOR TIBIAL not palpable not palpable   PERONEAL not Palpable   not Palpable    Abdomen: soft, NT, no masses palpated. Skin: no rashes, see extremities. Musculoskeletal: no muscle wasting or atrophy. See Extremities.  Neurologic: A&O X 3; Appropriate Affect, MOTOR FUNCTION: moving all extremities equally, motor strength 4/5 throughout. Speech is fluent/normal. CN 2-12 grossly intact.                      Non-Invasive Vascular Imaging: DATE: 08/01/2015 ABI: RIGHT: 0.49 (0.49, 04/19/15), TBI: 0.36, Waveforms: monophasic, AT is not detected;  LEFT: 0.71 (0.75), Waveforms: monophasic, TBI: 0.55   ASSESSMENT:  MCKENNON MANGE is a 69 y.o. male who is s/p aortobifemoral bypass graft with occlusion of the right limb. He also has a left-to-right femoral-femoral bypass graft on 12/20/11. He had  a history of a draining sinus from the left inguinal area. This closed without any evidence of residual infection. Patient developed a scrotal abscess apparently at his 2013 visit and had a small draining sinus lateral to the right inguinal incision, this has healed and recurred. He is status post left axillary to profunda femoris bypass using 8 mm  Hemashield Dacron graft on 06/17/14, and left lateral foot amputation by Dr. Ninfa Linden on 06/19/14. He has moderate claudication in both calves which is not life limiting, he has no rest pain, he has no signs of ischemia in his feet/legs .  ABI's  are stable in the right LE with severely decreased arterial perfusion and slightly diminished in the left LE with moderately decreased arterial perfusion; monophasic waveforms except right AT is absent.  His atherosclerotic risk factors include DM, former cigarette smoker and current occasional cigar smoker, CAD, and obesity.   PLAN:  Graduated walking program stressed and discussed.  Based on the patient's vascular studies and examination, pt will return to clinic in 6 months with ABI's. I advised him to notify us should he develop concerns re the circulation in his legs.   I discussed in depth with the patient the nature of atherosclerosis, and emphasized the importance of maximal medical management including strict control of blood pressure, blood glucose, and lipid levels, obtaining regular exercise, and cessation of smoking.  The patient is aware that without maximal medical management the underlying atherosclerotic disease process will progress, limiting the benefit of any interventions.  The patient was given information about PAD including signs, symptoms, treatment, what symptoms should prompt the patient to seek immediate medical care, and risk reduction measures to take.  Clemon Chambers, RN, MSN, FNP-C [facility] Phone: [phone]  Clinic MD:  Kellie Simmering  08/01/2015 11:44 AM

## 2015-12-21 DIAGNOSIS — J449 Chronic obstructive pulmonary disease, unspecified: Secondary | ICD-10-CM | POA: Diagnosis not present

## 2015-12-21 DIAGNOSIS — I10 Essential (primary) hypertension: Secondary | ICD-10-CM | POA: Diagnosis not present

## 2015-12-21 DIAGNOSIS — E1165 Type 2 diabetes mellitus with hyperglycemia: Secondary | ICD-10-CM | POA: Diagnosis not present

## 2015-12-21 DIAGNOSIS — I2510 Atherosclerotic heart disease of native coronary artery without angina pectoris: Secondary | ICD-10-CM | POA: Diagnosis not present

## 2015-12-21 DIAGNOSIS — L739 Follicular disorder, unspecified: Secondary | ICD-10-CM | POA: Diagnosis not present

## 2015-12-21 DIAGNOSIS — Z87891 Personal history of nicotine dependence: Secondary | ICD-10-CM | POA: Diagnosis not present

## 2016-01-17 DIAGNOSIS — E7800 Pure hypercholesterolemia, unspecified: Secondary | ICD-10-CM | POA: Diagnosis not present

## 2016-01-17 DIAGNOSIS — J449 Chronic obstructive pulmonary disease, unspecified: Secondary | ICD-10-CM | POA: Diagnosis not present

## 2016-01-17 DIAGNOSIS — G4700 Insomnia, unspecified: Secondary | ICD-10-CM | POA: Diagnosis not present

## 2016-01-17 DIAGNOSIS — Z6832 Body mass index (BMI) 32.0-32.9, adult: Secondary | ICD-10-CM | POA: Diagnosis not present

## 2016-01-17 DIAGNOSIS — E1165 Type 2 diabetes mellitus with hyperglycemia: Secondary | ICD-10-CM | POA: Diagnosis not present

## 2016-01-29 DIAGNOSIS — E113293 Type 2 diabetes mellitus with mild nonproliferative diabetic retinopathy without macular edema, bilateral: Secondary | ICD-10-CM | POA: Diagnosis not present

## 2016-01-29 DIAGNOSIS — E1165 Type 2 diabetes mellitus with hyperglycemia: Secondary | ICD-10-CM | POA: Diagnosis not present

## 2016-01-29 DIAGNOSIS — H5211 Myopia, right eye: Secondary | ICD-10-CM | POA: Diagnosis not present

## 2016-01-29 DIAGNOSIS — H25813 Combined forms of age-related cataract, bilateral: Secondary | ICD-10-CM | POA: Diagnosis not present

## 2016-02-06 ENCOUNTER — Encounter: Payer: Self-pay | Admitting: Family

## 2016-02-07 DIAGNOSIS — Z6832 Body mass index (BMI) 32.0-32.9, adult: Secondary | ICD-10-CM | POA: Diagnosis not present

## 2016-02-07 DIAGNOSIS — Z713 Dietary counseling and surveillance: Secondary | ICD-10-CM | POA: Diagnosis not present

## 2016-02-07 DIAGNOSIS — E1165 Type 2 diabetes mellitus with hyperglycemia: Secondary | ICD-10-CM | POA: Diagnosis not present

## 2016-02-12 ENCOUNTER — Encounter: Payer: Self-pay | Admitting: Family

## 2016-02-12 ENCOUNTER — Ambulatory Visit (INDEPENDENT_AMBULATORY_CARE_PROVIDER_SITE_OTHER): Payer: Medicare Other | Admitting: Family

## 2016-02-12 ENCOUNTER — Ambulatory Visit (HOSPITAL_COMMUNITY)
Admission: RE | Admit: 2016-02-12 | Discharge: 2016-02-12 | Disposition: A | Discharge status: Home / Self Care | Payer: Medicare Other | Source: Ambulatory Visit | Attending: Family | Admitting: Family

## 2016-02-12 VITALS — BP 123/69 | HR 76 | Temp 98.5°F | Resp 20 | Ht 69.0 in | Wt 224.0 lb

## 2016-02-12 DIAGNOSIS — E1151 Type 2 diabetes mellitus with diabetic peripheral angiopathy without gangrene: Secondary | ICD-10-CM | POA: Diagnosis not present

## 2016-02-12 DIAGNOSIS — Z95828 Presence of other vascular implants and grafts: Secondary | ICD-10-CM

## 2016-02-12 DIAGNOSIS — F17290 Nicotine dependence, other tobacco product, uncomplicated: Secondary | ICD-10-CM

## 2016-02-12 DIAGNOSIS — Z720 Tobacco use: Secondary | ICD-10-CM | POA: Diagnosis not present

## 2016-02-12 DIAGNOSIS — Z48812 Encounter for surgical aftercare following surgery on the circulatory system: Secondary | ICD-10-CM | POA: Insufficient documentation

## 2016-02-12 DIAGNOSIS — I779 Disorder of arteries and arterioles, unspecified: Secondary | ICD-10-CM

## 2016-02-12 DIAGNOSIS — Z87891 Personal history of nicotine dependence: Secondary | ICD-10-CM

## 2016-02-12 NOTE — Progress Notes (Signed)
[facility] [place]   CC: Follow up peripheral artery occlusive disease  History of Present Illness Logan [name] is a 70 y.o. male patient of Dr. Kellie Simmering had an aortobifemoral bypass graft with occlusion of the right limb. He also has a left-to-right femoral-femoral bypass graft on 12/20/11. He had a history of a draining sinus from the left inguinal area. This closed without any evidence of residual infection. Patient developed a scrotal abscess apparently at his 2013 visit and had a small draining sinus lateral to the right inguinal incision, this had healed and has now recurred. He is status post left axillary to profunda femoris bypass using 8 mm Hemashield Dacron graft on 06/17/14, and left lateral foot amputation by Dr. Ninfa Linden on 06/19/14. He returns today for routine follow up of his PAD.  Dr. Blanch Media is his podiatrist.  He has claudication in both calves with walking about a 1/2 block, relieved by rest. He has right hip pain after walking about a block; he stopped working out regularly in a gym, hopes to resume.  He has recurrent ulcers in his scrotum, states his PCP is aware and that he was told by a surgeon that debrided these ulcers that they would recur.  Pt denies any history of stroke or TIA. He has cardiac stents, did not experience any symptoms of an MI, but was told that he had an MI.  Pt Diabetic: Yes, states his home glucose readings are averaging 160, states his last A1C was 7.?  Pt smoker: resumed smoking cigars since his grand daughter died unexpected lyin Oct 07, 2014, sister died in 08/07/15 (1 cigar every 2-4 days, stopped cigarettes in 2012)  Pt meds include: Statin :Yes ASA: Yes Other anticoagulants/antiplatelets: clopidogrel, prescribed by Dr. Brigitte Pulse for his PAD, per pt    Past Medical History:  Diagnosis Date  . Anginal pain ([place])   . Cardiomyopathy ([place])   . Cataracts, bilateral   . Coronary artery disease    taxis DES stent  circumflex 02/2003.... residual total distal circumflex and 50% LAD/ cardiolite 08/2006 inferior scar no ischemia  . Coronary artery disease   . Degenerative disc disease, lumbar   . Diabetes mellitus    type 2  . Dyslipidemia   . History of blood transfusion   . Hypertension   . Myocardial infarction ([place])   . Overweight(278.02)   . PVD (peripheral vascular disease) ([place])    right to left fem -fem bypass 1997/ aortabifemoral bypass 2005 right to left fem-fem 06-Aug-2004. Secondary to occluded righ tlimb of aortobifemoral bypass  . Shortness of breath   . Tobacco abuse     Social History Social History  Substance Use Topics  . Smoking status: Former Smoker    Years: 40.00    Types: Cigars    Quit date: 04/20/2014  . Smokeless tobacco: Never Used     Comment: Quit x 2 years off and on  . Alcohol use 0.0 oz/week     Comment: liquior sometimes, seldom    Family History Family History  Problem Relation Age of Onset  . Hypertension Mother   . Varicose Veins Mother   . Hypertension Brother   . Colon cancer Neg Hx     Past Surgical History:  Procedure Laterality Date  . AMPUTATION Left 06/19/2014   Procedure: Fifth toe amputaion, MPT Joint;  Surgeon: Mcarthur Rossetti, MD;  Location: [place];  Service: Orthopedics;  Laterality: Left;  . AORTO TO RIGHT COMMON FEMORAL  AND LEFT PROFUNDA FEMORIS BYPASS GRAFT  09-28-2003 DR Kellie Simmering   SEVERE AORTOILIAC OCCLUSIVE DISEASE W/ OCCLUDED FEM-FEM BYPASS OF BOTH LOWER EXTREMITIES  . AXILLARY-FEMORAL BYPASS GRAFT  12/20/2011   Procedure: BYPASS GRAFT AXILLA-BIFEMORAL;  Surgeon: Conrad [place], MD;  Location: [place];  Service: Vascular;  Laterality: Bilateral;  Left aorta bilateral femoral thrombectomy, femoral to femoral thrombectomy, and revision of Femoral to femoral graft. and Thrombectomy of Left tibial artery.  Marland Kitchen AXILLARY-FEMORAL BYPASS GRAFT Left 06/17/2014   Procedure:  LEFT AXILLA- LEFT FEMORAL ARTERTY BYPASS GRAFT;  Surgeon: Mal Misty, MD;   Location: [facility];  Service: Vascular;  Laterality: Left;  . COLONOSCOPY    . COLONOSCOPY N/A 03/28/2015   Procedure: COLONOSCOPY;  Surgeon: Daneil Dolin, MD;  [facility];  Service: Endoscopy;  Laterality: N/A;  0900 - moved to 9:30 - office to notify  . CORONARY ANGIOPLASTY WITH STENT PLACEMENT  02-23-2003  DR Lia Foyer   PCI W/ STENTING PROXIMAL CIRCUMFLEX  . DEBRIDEMENT LEG  12/25/11  . FASCIOTOMY  12/20/2011   Procedure: FASCIOTOMY;  Surgeon: Conrad Blakely, MD;  Location: [facility];  Service: Vascular;  Laterality: Right;  right calf facsicotomy.  . FEMORAL-FEMORAL BYPASS GRAFT  1997  DR LAWSON   RIGHT TO LEFT  . ilecolonscopy  05/05/07   RMR:ana; papilla otherwise normal rectum pancolonic diverticula. two pedunculated polyps removed, tubular adenoma  . INCISION AND DRAINAGE OF WOUND  02/17/2012   Procedure: IRRIGATION AND DEBRIDEMENT WOUND;  Surgeon: Theodoro Kos, DO;  Location: [place];  Service: Plastics;  Laterality: Bilateral;  . KNEE SURGERY    . LEFT-TO-RIGHT FEMORAL-FEMORAL BYPASS GRAFT  07-30-2004  DR LAWSON   OCCLUSION RIGHT LIMB AORTOBIFEMORAL BYPASS GRAFT  W/ SEVERE CLAUDICATION  . LOWER EXTREMITY ANGIOGRAM  12/20/2011   Procedure: LOWER EXTREMITY ANGIOGRAM;  Surgeon: Conrad [place], MD;  Location: [place];  Service: Vascular;  Laterality: Bilateral;  Intraoperative Abdominal Aortogram with bilateral runoff  . LUMBAR LAMINECTOMY/DECOMPRESSION MICRODISCECTOMY  06/02/2012   Procedure: LUMBAR LAMINECTOMY/DECOMPRESSION MICRODISCECTOMY 1 LEVEL;  Surgeon: Tobi Bastos, MD;  [facility];  Service: Orthopedics;  Laterality: N/A;  Central Decompression Lumbar Laminectomy L5-S1   . RIGHT LATERAL PAROTIDECTOMY WITH FACIAL VERVE PRESERVATION  02-25-2001  DR REDMAN   NEOPLASM RIGHT PAROTID GLAND  . SPINE SURGERY      Allergies  Allergen Reactions  . Aspirin Hives    Tolerates low dose aspirin  . Penicillins Rash and Other (See Comments)    Immune system shut  down     Current Outpatient Prescriptions  Medication Sig Dispense Refill  . amLODipine (NORVASC) 10 MG tablet Take 10 mg by mouth daily.     Marland Kitchen aspirin EC 81 MG tablet Take 81 mg by mouth daily.     . clopidogrel (PLAVIX) 75 MG tablet Take 75 mg by mouth daily.     Marland Kitchen glimepiride (AMARYL) 4 MG tablet Take 4 mg by mouth 2 (two) times daily.     . indomethacin (INDOCIN) 50 MG capsule Take 50 mg by mouth 2 (two) times daily as needed (gout flare-up). Reported on 08/01/2015    . insulin aspart (NOVOLOG) 100 UNIT/ML injection CBG 70 - 120: 0 units CBG 121 - 150: 2 units CBG 151 - 200: 3 units CBG 201 - 250: 5 units CBG 251 - 300: 8 units  Call MD if CBg is greater than 200 1 vial 0  . insulin glargine (LANTUS) 100 UNIT/ML injection Inject  50 Units into the skin 2 (two) times daily. Reported on 08/01/2015    . insulin lispro (HUMALOG) 100 UNIT/ML injection Inject 10 Units into the skin 3 (three) times daily before meals. Reported on 08/01/2015    . lisinopril (PRINIVIL,ZESTRIL) 40 MG tablet Take 40 mg by mouth at bedtime.     . metFORMIN (GLUCOPHAGE-XR) 500 MG 24 hr tablet Take 1,000 mg by mouth 2 (two) times daily.     . metoprolol succinate (TOPROL-XL) 25 MG 24 hr tablet daily.    . Multiple Vitamin (MULTIVITAMIN WITH MINERALS) TABS tablet Take 1 tablet by mouth daily.    . Omega-3 Fatty Acids (FISH OIL) 1000 MG CAPS Take 2,000 mg by mouth 2 (two) times daily.     . ONE TOUCH ULTRA TEST test strip 3 (three) times daily. for testing  0  . polyethylene glycol-electrolytes (NULYTELY/GOLYTELY) 420 G solution Take 4,000 mLs by mouth once. 4000 mL 0  . simvastatin (ZOCOR) 20 MG tablet Take 20 mg by mouth at bedtime.      No current facility-administered medications for this visit.     ROS: See HPI for pertinent positives and negatives.   Physical Examination  Vitals:   02/12/16 1445 02/12/16 1447  BP: 125/68 123/69  Pulse: 76   Resp: 20   Temp: 98.5 F (36.9 C)   TempSrc: Oral   SpO2:  100%   Weight: 224 lb (101.6 kg)   Height: 5\' 9"  (1.753 m)    Body mass index is 33.08 kg/m.  General: A&O x 3, WDWN, obese male. Gait: normal Eyes: Pupils are equal Pulmonary:Respirations are non labored, CTAB. Cardiac: regular rhythm and rate, no detected murmur.     Carotid Bruits Right Left   Negative Negative  Aorta is not palpable. Radial pulses: absent right radial, palpable right ulnar pulse, palpable left radial, palpable right brachial pulse.    VASCULAR EXAM: Extremities with no ischemic changes. Palpable left axillary to profunda femoris bypass graft at his visit on 08/01/15, not palpable today, faint Doppler signal over this bypass. Surgically absent left 5th toe to the MTP joint. No open wounds.      LE Pulses Right Left   FEMORAL not palpable Not palpable    POPLITEAL not palpable  not palpable   POSTERIOR TIBIAL not palpable not palpable    DORSALIS PEDIS  ANTERIOR TIBIAL not palpable 1+palpable   PERONEAL not Palpable   not Palpable    Abdomen: soft, NT, no masses palpated. Skin: no rashes, see extremities. Musculoskeletal: no muscle wasting or atrophy. See Extremities.  Neurologic: A&O X 3; Appropriate Affect, MOTOR FUNCTION: moving all extremities equally, motor strength 4/5 throughout. Speech is fluent/normal. CN 2-12 grossly intact.    ASSESSMENT: Logan [name] is a 70 y.o. male  who is s/p aortobifemoral bypass graft with occlusion of the right limb. He also has a left-to-right femoral-femoral bypass graft on 12/20/11. He had a history of a draining sinus from the left inguinal area. This closed without any evidence of residual infection. Patient developed  a scrotal abscess apparently at his 2013 visit and had a small draining sinus lateral to the right inguinal incision, this has healed and recurred and healed. He is status post left axillary to profunda femoris bypass using 8 mm Hemashield Dacron graft on 06/17/14 by Dr. Kellie Simmering, and left lateral foot amputation by Dr. Ninfa Linden on 06/19/14. He has moderate claudication in both legs which is not life limiting, he has no rest pain, he  has no signs of ischemia in his feet/legs. He has stopped the graduated walking program due to reported legs hurting when he walks; he denies any known lumbar spine problems.   His atherosclerotic risk factors include DM, former cigarette smoker and current occasional cigar smoker, CAD, sedentary lifestyle, and obesity.   At his visit on 08/01/15, the left ax-fem bypass graft pulse was palpable; the graft pulse is not palpable today and has a faint Doppler signal.   DATA ABI is stable in the right LE compared to 08/01/15, with severely decreased arterial perfusion (0.46), monophasic waveforms except anterior tibial is absent. Right TBI decreased to 0.19 from 0.36 Left ABI decreased to 0.56 today from 0.71 on 08/01/15. Left TBI decreased to 0.08 from 0.55 on 08/01/15. Monophasic waveforms in the left LE.   PLAN:  Graduated walking program again stressed and discussed. The patient was counseled re smoking cessation and given several free resources re smoking cessation.  Based on the patient's vascular studies and examination, and after discussing with Dr. Trula Slade, pt will return to clinic on 02/15/16, see Dr. Bridgett Larsson after duplex of left ax-fem bypass graft which is now pulseless.   I advised him to notify us should he develop concerns re the circulation in his legs.   I discussed in depth with the patient the nature of atherosclerosis, and emphasized the importance of maximal medical management including strict control of blood pressure, blood glucose, and lipid levels,  obtaining regular exercise, and cessation of smoking.  The patient is aware that without maximal medical management the underlying atherosclerotic disease process will progress, limiting the benefit of any interventions.  The patient was given information about PAD including signs, symptoms, treatment, what symptoms should prompt the patient to seek immediate medical care, and risk reduction measures to take.  Clemon Chambers, RN, MSN, FNP-C [facility] Phone: [phone]  Clinic MD: Trula Slade  02/12/16 2:55 PM

## 2016-02-12 NOTE — Patient Instructions (Signed)
Peripheral Vascular Disease Peripheral vascular disease (PVD) is a disease of the blood vessels that are not part of your heart and brain. A simple term for PVD is poor circulation. In most cases, PVD narrows the blood vessels that carry blood from your heart to the rest of your body. This can result in a decreased supply of blood to your arms, legs, and internal organs, like your stomach or kidneys. However, it most often affects a person's lower legs and feet. There are two types of PVD.  Organic PVD. This is the more common type. It is caused by damage to the structure of blood vessels.  Functional PVD. This is caused by conditions that make blood vessels contract and tighten (spasm). Without treatment, PVD tends to get worse over time. PVD can also lead to acute ischemic limb. This is when an arm or limb suddenly has trouble getting enough blood. This is a medical emergency. CAUSES Each type of PVD has many different causes. The most common cause of PVD is buildup of a fatty material (plaque) inside of your arteries (atherosclerosis). Small amounts of plaque can break off from the walls of the blood vessels and become lodged in a smaller artery. This blocks blood flow and can cause acute ischemic limb. Other common causes of PVD include:  Blood clots that form inside of blood vessels.  Injuries to blood vessels.  Diseases that cause inflammation of blood vessels or cause blood vessel spasms.  Health behaviors and health history that increase your risk of developing PVD. RISK FACTORS  You may have a greater risk of PVD if you:  Have a family history of PVD.  Have certain medical conditions, including:  High cholesterol.  Diabetes.  High blood pressure (hypertension).  Coronary heart disease.  Past problems with blood clots.  Past injury, such as burns or a broken bone. These may have damaged blood vessels in your limbs.  Buerger disease. This is caused by inflamed blood  vessels in your hands and feet.  Some forms of arthritis.  Rare birth defects that affect the arteries in your legs.  Use tobacco.  Do not get enough exercise.  Are obese.  Are age 50 or older. SIGNS AND SYMPTOMS  PVD may cause many different symptoms. Your symptoms depend on what part of your body is not getting enough blood. Some common signs and symptoms include:  Cramps in your lower legs. This may be a symptom of poor leg circulation (claudication).  Pain and weakness in your legs while you are physically active that goes away when you rest (intermittent claudication).  Leg pain when at rest.  Leg numbness, tingling, or weakness.  Coldness in a leg or foot, especially when compared with the other leg.  Skin or hair changes. These can include:  Hair loss.  Shiny skin.  Pale or bluish skin.  Thick toenails.  Inability to get or maintain an erection (erectile dysfunction). People with PVD are more prone to developing ulcers and sores on their toes, feet, or legs. These may take longer than normal to heal. DIAGNOSIS Your health care provider may diagnose PVD from your signs and symptoms. The health care provider will also do a physical exam. You may have tests to find out what is causing your PVD and determine its severity. Tests may include:  Blood pressure recordings from your arms and legs and measurements of the strength of your pulses (pulse volume recordings).  Imaging studies using sound waves to take pictures of   the blood flow through your blood vessels (Doppler ultrasound).  Injecting a dye into your blood vessels before having imaging studies using:  X-rays (angiogram or arteriogram).  Computer-generated X-rays (CT angiogram).  A powerful electromagnetic field and a computer (magnetic resonance angiogram or MRA). TREATMENT Treatment for PVD depends on the cause of your condition and the severity of your symptoms. It also depends on your age. Underlying  causes need to be treated and controlled. These include long-lasting (chronic) conditions, such as diabetes, high cholesterol, and high blood pressure. You may need to first try making lifestyle changes and taking medicines. Surgery may be needed if these do not work. Lifestyle changes may include:  Quitting smoking.  Exercising regularly.  Following a low-fat, low-cholesterol diet. Medicines may include:  Blood thinners to prevent blood clots.  Medicines to improve blood flow.  Medicines to improve your blood cholesterol levels. Surgical procedures may include:  A procedure that uses an inflated balloon to open a blocked artery and improve blood flow (angioplasty).  A procedure to put in a tube (stent) to keep a blocked artery open (stent implant).  Surgery to reroute blood flow around a blocked artery (peripheral bypass surgery).  Surgery to remove dead tissue from an infected wound on the affected limb.  Amputation. This is surgical removal of the affected limb. This may be necessary in cases of acute ischemic limb that are not improved through medical or surgical treatments. HOME CARE INSTRUCTIONS  Take medicines only as directed by your health care provider.  Do not use any tobacco products, including cigarettes, chewing tobacco, or electronic cigarettes. If you need help quitting, ask your health care provider.  Lose weight if you are overweight, and maintain a healthy weight as directed by your health care provider.  Eat a diet that is low in fat and cholesterol. If you need help, ask your health care provider.  Exercise regularly. Ask your health care provider to suggest some good activities for you.  Use compression stockings or other mechanical devices as directed by your health care provider.  Take good care of your feet.  Wear comfortable shoes that fit well.  Check your feet often for any cuts or sores. SEEK MEDICAL CARE IF:  You have cramps in your legs  while walking.  You have leg pain when you are at rest.  You have coldness in a leg or foot.  Your skin changes.  You have erectile dysfunction.  You have cuts or sores on your feet that are not healing. SEEK IMMEDIATE MEDICAL CARE IF:  Your arm or leg turns cold and blue.  Your arms or legs become red, warm, swollen, painful, or numb.  You have chest pain or trouble breathing.  You suddenly have weakness in your face, arm, or leg.  You become very confused or lose the ability to speak.  You suddenly have a very bad headache or lose your vision.   This information is not intended to replace advice given to you by your health care provider. Make sure you discuss any questions you have with your health care provider.   Document Released: 06/13/2004 Document Revised: 05/27/2014 Document Reviewed: 10/14/2013 Elsevier Interactive Patient Education 2016 [employer]  

## 2016-02-13 ENCOUNTER — Other Ambulatory Visit: Payer: Self-pay | Admitting: *Deleted

## 2016-02-13 DIAGNOSIS — T859XXD Unspecified complication of internal prosthetic device, implant and graft, subsequent encounter: Secondary | ICD-10-CM

## 2016-02-14 ENCOUNTER — Encounter: Payer: Self-pay | Admitting: Vascular Surgery

## 2016-02-14 ENCOUNTER — Ambulatory Visit (HOSPITAL_COMMUNITY)
Admission: RE | Admit: 2016-02-14 | Discharge: 2016-02-14 | Disposition: A | Discharge status: Home / Self Care | Payer: Medicare Other | Source: Ambulatory Visit | Attending: Vascular Surgery | Admitting: Vascular Surgery

## 2016-02-14 DIAGNOSIS — T859XXD Unspecified complication of internal prosthetic device, implant and graft, subsequent encounter: Secondary | ICD-10-CM | POA: Diagnosis not present

## 2016-02-14 DIAGNOSIS — Z95828 Presence of other vascular implants and grafts: Secondary | ICD-10-CM | POA: Insufficient documentation

## 2016-02-15 ENCOUNTER — Ambulatory Visit (INDEPENDENT_AMBULATORY_CARE_PROVIDER_SITE_OTHER): Payer: Medicare Other | Admitting: Vascular Surgery

## 2016-02-15 ENCOUNTER — Encounter: Payer: Self-pay | Admitting: Vascular Surgery

## 2016-02-15 VITALS — BP 140/75 | HR 82 | Temp 97.0°F | Resp 20 | Ht 69.0 in | Wt 224.0 lb

## 2016-02-15 DIAGNOSIS — I779 Disorder of arteries and arterioles, unspecified: Secondary | ICD-10-CM | POA: Diagnosis not present

## 2016-02-15 DIAGNOSIS — I70213 Atherosclerosis of native arteries of extremities with intermittent claudication, bilateral legs: Secondary | ICD-10-CM

## 2016-02-15 NOTE — Progress Notes (Signed)
Established Previous Bypass   History of Present Illness  Logan [name] is a 70 y.o. ([birth year]) male s/p multiple bypasses with Dr. Kellie Simmering who presents with chief complaint: mild pain in feet with walking.    Previous operation(s) include: 1. L ax-PFA BPG (06/17/14) 2. Closure of R med calf fasciotomy, partial closure of R lateral calf fasciotomy, VAC (12/25/11) 3. L ABF limb TE, femfem BPG TE, Revision of Fem-pop w/ interposition, R tib TE, R 4 compartment fasciotomies, BRo (12/20/11) 4. L to R fem-fem BPG (10/02/10) 5. ABF (10/02/10)  The patient's symptoms have not progressed.  The patient's symptoms are: mild intermittent claudication which he does not feel are lifestyle limiting. The patient's treatment regimen currently included: maximal medical management.  Past Medical History:  Diagnosis Date  . Anginal pain ([place])   . Cardiomyopathy ([place])   . Cataracts, bilateral   . Coronary artery disease    taxis DES stent circumflex 02/2003.... residual total distal circumflex and 50% LAD/ cardiolite 08/2006 inferior scar no ischemia  . Coronary artery disease   . Degenerative disc disease, lumbar   . Diabetes mellitus    type 2  . Dyslipidemia   . History of blood transfusion   . Hypertension   . Myocardial infarction ([place])   . Overweight(278.02)   . PVD (peripheral vascular disease) ([place])    right to left fem -fem bypass 1997/ aortabifemoral bypass 2005 right to left fem-fem 07/2004. Secondary to occluded righ tlimb of aortobifemoral bypass  . Shortness of breath   . Tobacco abuse     Past Surgical History:  Procedure Laterality Date  . AMPUTATION Left 06/19/2014   Procedure: Fifth toe amputaion, MPT Joint;  Surgeon: Mcarthur Rossetti, MD;  Location: [facility];  Service: Orthopedics;  Laterality: Left;  . AORTO TO RIGHT COMMON FEMORAL AND LEFT PROFUNDA FEMORIS BYPASS GRAFT  09-28-2003 DR LAWSON   SEVERE AORTOILIAC OCCLUSIVE DISEASE W/ OCCLUDED FEM-FEM BYPASS OF BOTH LOWER  EXTREMITIES  . AXILLARY-FEMORAL BYPASS GRAFT  12/20/2011   Procedure: BYPASS GRAFT AXILLA-BIFEMORAL;  Surgeon: Conrad Watkins, MD;  Location: [facility];  Service: Vascular;  Laterality: Bilateral;  Left aorta bilateral femoral thrombectomy, femoral to femoral thrombectomy, and revision of Femoral to femoral graft. and Thrombectomy of Left tibial artery.  Marland Kitchen AXILLARY-FEMORAL BYPASS GRAFT Left 06/17/2014   Procedure:  LEFT AXILLA- LEFT FEMORAL ARTERTY BYPASS GRAFT;  Surgeon: Mal Misty, MD;  Location: [facility];  Service: Vascular;  Laterality: Left;  . COLONOSCOPY    . COLONOSCOPY N/A 03/28/2015   Procedure: COLONOSCOPY;  Surgeon: Daneil Dolin, MD;  [facility];  Service: Endoscopy;  Laterality: N/A;  0900 - moved to 9:30 - office to notify  . CORONARY ANGIOPLASTY WITH STENT PLACEMENT  02-23-2003  DR Lia Foyer   PCI W/ STENTING PROXIMAL CIRCUMFLEX  . DEBRIDEMENT LEG  12/25/11  . FASCIOTOMY  12/20/2011   Procedure: FASCIOTOMY;  Surgeon: Conrad Bluejacket, MD;  Location: [facility];  Service: Vascular;  Laterality: Right;  right calf facsicotomy.  . FEMORAL-FEMORAL BYPASS GRAFT  1997  DR LAWSON   RIGHT TO LEFT  . ilecolonscopy  05/05/07   RMR:ana; papilla otherwise normal rectum pancolonic diverticula. two pedunculated polyps removed, tubular adenoma  . INCISION AND DRAINAGE OF WOUND  02/17/2012   Procedure: IRRIGATION AND DEBRIDEMENT WOUND;  Surgeon: Theodoro Kos, DO;  Location: [facility];  Service: Plastics;  Laterality: Bilateral;  . KNEE SURGERY    . LEFT-TO-RIGHT FEMORAL-FEMORAL BYPASS  GRAFT  07-30-2004  DR LAWSON   OCCLUSION RIGHT LIMB AORTOBIFEMORAL BYPASS GRAFT  W/ SEVERE CLAUDICATION  . LOWER EXTREMITY ANGIOGRAM  12/20/2011   Procedure: LOWER EXTREMITY ANGIOGRAM;  Surgeon: Conrad Nikolaevsk, MD;  Location: [facility];  Service: Vascular;  Laterality: Bilateral;  Intraoperative Abdominal Aortogram with bilateral runoff  . LUMBAR LAMINECTOMY/DECOMPRESSION MICRODISCECTOMY  06/02/2012   Procedure:  LUMBAR LAMINECTOMY/DECOMPRESSION MICRODISCECTOMY 1 LEVEL;  Surgeon: Tobi Bastos, MD;  [facility];  Service: Orthopedics;  Laterality: N/A;  Central Decompression Lumbar Laminectomy L5-S1   . RIGHT LATERAL PAROTIDECTOMY WITH FACIAL VERVE PRESERVATION  02-25-2001  DR REDMAN   NEOPLASM RIGHT PAROTID GLAND  . SPINE SURGERY      Social History   Social History  . Marital status: Married    Spouse name: N/A  . Number of children: N/A  . Years of education: N/A   Occupational History  . Not on file.   Social History Main Topics  . Smoking status: Former Smoker    Years: 40.00    Types: Cigars    Quit date: 04/20/2014  . Smokeless tobacco: Never Used     Comment: Quit x 2 years off and on  . Alcohol use 0.0 oz/week     Comment: liquior sometimes, seldom  . Drug use: No  . Sexual activity: Not on file   Other Topics Concern  . Not on file   Social History Narrative  . No narrative on file    Family History  Problem Relation Age of Onset  . Hypertension Mother   . Varicose Veins Mother   . Hypertension Brother   . Colon cancer Neg Hx     Current Outpatient Prescriptions  Medication Sig Dispense Refill  . amLODipine (NORVASC) 10 MG tablet Take 10 mg by mouth daily.     Marland Kitchen aspirin EC 81 MG tablet Take 81 mg by mouth daily.     . clopidogrel (PLAVIX) 75 MG tablet Take 75 mg by mouth daily.     Marland Kitchen glimepiride (AMARYL) 4 MG tablet Take 4 mg by mouth 2 (two) times daily.     . indomethacin (INDOCIN) 50 MG capsule Take 50 mg by mouth 2 (two) times daily as needed (gout flare-up). Reported on 08/01/2015    . insulin aspart (NOVOLOG) 100 UNIT/ML injection CBG 70 - 120: 0 units CBG 121 - 150: 2 units CBG 151 - 200: 3 units CBG 201 - 250: 5 units CBG 251 - 300: 8 units  Call MD if CBg is greater than 200 1 vial 0  . insulin glargine (LANTUS) 100 UNIT/ML injection Inject 50 Units into the skin 2 (two) times daily. Reported on 08/01/2015    . insulin lispro (HUMALOG) 100  UNIT/ML injection Inject 10 Units into the skin 3 (three) times daily before meals. Reported on 08/01/2015    . lisinopril (PRINIVIL,ZESTRIL) 40 MG tablet Take 40 mg by mouth at bedtime.     . metFORMIN (GLUCOPHAGE-XR) 500 MG 24 hr tablet Take 1,000 mg by mouth 2 (two) times daily.     . metoprolol succinate (TOPROL-XL) 25 MG 24 hr tablet daily.    . Multiple Vitamin (MULTIVITAMIN WITH MINERALS) TABS tablet Take 1 tablet by mouth daily.    . Omega-3 Fatty Acids (FISH OIL) 1000 MG CAPS Take 2,000 mg by mouth 2 (two) times daily.     . ONE TOUCH ULTRA TEST test strip 3 (three) times daily. for testing  0  . polyethylene glycol-electrolytes (NULYTELY/GOLYTELY)  420 G solution Take 4,000 mLs by mouth once. 4000 mL 0  . simvastatin (ZOCOR) 20 MG tablet Take 20 mg by mouth at bedtime.      No current facility-administered medications for this visit.      Allergies  Allergen Reactions  . Aspirin Hives    Tolerates low dose aspirin  . Penicillins Rash and Other (See Comments)    Immune system shut down      REVIEW OF SYSTEMS:  (Positives checked otherwise negative)  CARDIOVASCULAR:   [ ]  chest pain,  [ ]  chest pressure,  [ ]  palpitations,  [ ]  shortness of breath when laying flat,  [ ]  shortness of breath with exertion,   [x]  pain in feet when walking,  [ ]  pain in feet when laying flat, [ ]  history of blood clot in veins (DVT),  [ ]  history of phlebitis,  [ ]  swelling in legs,  [ ]  varicose veins  PULMONARY:   [ ]  productive cough,  [ ]  asthma,  [ ]  wheezing  NEUROLOGIC:   [ ]  weakness in arms or legs,  [ ]  numbness in arms or legs,  [ ]  difficulty speaking or slurred speech,  [ ]  temporary loss of vision in one eye,  [ ]  dizziness  HEMATOLOGIC:   [ ]  bleeding problems,  [ ]  problems with blood clotting too easily  MUSCULOSKEL:   [ ]  joint pain, [ ]  joint swelling  GASTROINTEST:   [ ]  vomiting blood,  [ ]  blood in stool     GENITOURINARY:   [ ]  burning with  urination,  [ ]  blood in urine  PSYCHIATRIC:   [ ]  history of major depression  INTEGUMENTARY:   [ ]  rashes,  [ ]  ulcers  CONSTITUTIONAL:   [ ]  fever,  [ ]  chills   Physical Examination  Vitals:   02/15/16 0926  BP: 140/75  Pulse: 82  Resp: 20  Temp: 97 F (36.1 C)  TempSrc: Oral  SpO2: 96%  Weight: 224 lb (101.6 kg)  Height: 5\' 9"  (1.753 m)    Body mass index is 33.08 kg/m.  General: Alert, O x 3, WD,NAD  Pulmonary: Sym exp, good B air movt,CTA B  Cardiac: RRR, Nl S1, S2, no Murmurs, No rubs, No S3,S4  Vascular: Vessel Right Left  Radial Palpable Palpable  Brachial Palpable Palpable  Carotid Palpable, No Bruit Palpable, No Bruit  Aorta Not palpable N/A  Femoral Not palpable Not palpable  Popliteal Not palpable Not palpable  PT Not palpable Not palpable  DP Not palpable Not palpable   Gastrointestinal: soft, non-distended, non-tender to palpation, No guarding or rebound, no HSM, no masses, no CVAT B, No palpable prominent aortic pulse,    Musculoskeletal: M/S 5/5 throughout  , Extremities without ischemic changes  , Edema in B legs,  , No LDS present  Neurologic: CN 2-12 intact , Pain and light touch intact in extremities , Motor exam as listed above   Medical Decision Making  Logan [name] is a 70 y.o. male who presents with: Occluded ABF, Occluded L ax-fem BPG, Occluded fem-fem BPG, BLE intermittent claudication    Listening to the patient, his sx suggest that his L ax-PFA bypass may have been occluded for months without any severe worsening in leg sx.  He does not feel he has rest pain or lifestyle limiting claudication.  Pt has now undergone multiple revascularization procedures with limited long-term patency.    I reiterated to him  the importance of smoking cessation as an adjunct to limiting the progression of his underlying atherosclerosis.  He discussed limiting any further interventions unless he develops critical limb ischemia   Based  on the patient's vascular studies and examination, I have offered the patient: BLE ABI in 3 months.  I discussed in depth with the patient the nature of atherosclerosis, and emphasized the importance of maximal medical management including strict control of blood pressure, blood glucose, and lipid levels, obtaining regular exercise, and cessation of smoking.    The patient is aware that without maximal medical management the underlying atherosclerotic disease process will progress, limiting the benefit of any interventions.  I discussed in depth with the patient the need for long term surveillance to improve the primary assisted patency of his bypass.  The patient agrees to cooperate with such. The patient is currently on a statin:  Zocor. The patient is currently on an anti-platelet: ASA, Plavix.  Thank you for allowing [place] to participate in this patient's care.   Adele Barthel, MD, FACS [facility] Office: [phone] Pager: [phone]

## 2016-02-20 NOTE — Addendum Note (Signed)
Addended by: Kaleen Mask on: 02/20/2016 01:27 PM   Modules accepted: Orders

## 2016-03-08 DIAGNOSIS — Z299 Encounter for prophylactic measures, unspecified: Secondary | ICD-10-CM | POA: Diagnosis not present

## 2016-03-08 DIAGNOSIS — Z6834 Body mass index (BMI) 34.0-34.9, adult: Secondary | ICD-10-CM | POA: Diagnosis not present

## 2016-03-08 DIAGNOSIS — J449 Chronic obstructive pulmonary disease, unspecified: Secondary | ICD-10-CM | POA: Diagnosis not present

## 2016-03-08 DIAGNOSIS — G4700 Insomnia, unspecified: Secondary | ICD-10-CM | POA: Diagnosis not present

## 2016-03-08 DIAGNOSIS — E1165 Type 2 diabetes mellitus with hyperglycemia: Secondary | ICD-10-CM | POA: Diagnosis not present

## 2016-03-25 DIAGNOSIS — H2513 Age-related nuclear cataract, bilateral: Secondary | ICD-10-CM | POA: Diagnosis not present

## 2016-03-25 DIAGNOSIS — E113293 Type 2 diabetes mellitus with mild nonproliferative diabetic retinopathy without macular edema, bilateral: Secondary | ICD-10-CM | POA: Diagnosis not present

## 2016-03-29 DIAGNOSIS — I10 Essential (primary) hypertension: Secondary | ICD-10-CM | POA: Diagnosis not present

## 2016-03-29 DIAGNOSIS — Z299 Encounter for prophylactic measures, unspecified: Secondary | ICD-10-CM | POA: Diagnosis not present

## 2016-03-29 DIAGNOSIS — J069 Acute upper respiratory infection, unspecified: Secondary | ICD-10-CM | POA: Diagnosis not present

## 2016-03-29 DIAGNOSIS — E7800 Pure hypercholesterolemia, unspecified: Secondary | ICD-10-CM | POA: Diagnosis not present

## 2016-03-29 DIAGNOSIS — Z6834 Body mass index (BMI) 34.0-34.9, adult: Secondary | ICD-10-CM | POA: Diagnosis not present

## 2016-03-29 DIAGNOSIS — E1165 Type 2 diabetes mellitus with hyperglycemia: Secondary | ICD-10-CM | POA: Diagnosis not present

## 2016-05-02 DIAGNOSIS — Z299 Encounter for prophylactic measures, unspecified: Secondary | ICD-10-CM | POA: Diagnosis not present

## 2016-05-02 DIAGNOSIS — E1165 Type 2 diabetes mellitus with hyperglycemia: Secondary | ICD-10-CM | POA: Diagnosis not present

## 2016-05-02 DIAGNOSIS — Z6834 Body mass index (BMI) 34.0-34.9, adult: Secondary | ICD-10-CM | POA: Diagnosis not present

## 2016-05-02 DIAGNOSIS — J449 Chronic obstructive pulmonary disease, unspecified: Secondary | ICD-10-CM | POA: Diagnosis not present

## 2016-05-02 DIAGNOSIS — I10 Essential (primary) hypertension: Secondary | ICD-10-CM | POA: Diagnosis not present

## 2016-05-17 ENCOUNTER — Encounter (HOSPITAL_COMMUNITY): Payer: Medicare Other

## 2016-05-17 ENCOUNTER — Ambulatory Visit: Payer: Medicare Other | Admitting: Vascular Surgery

## 2016-06-04 ENCOUNTER — Encounter: Payer: Self-pay | Admitting: Vascular Surgery

## 2016-06-06 NOTE — Progress Notes (Signed)
Established Previous Bypass   History of Present Illness  Logan [name] is a 71 y.o. ([birth year]) male who presents with chief complaint: none.  Previous operations completed by Dr. Kellie Simmering include:   1. L ax-PFA BPG (06/17/14) 2. Closure of R med calf fasciotomy, partial closure of R lateral calf fasciotomy, VAC (12/25/11) 3. L ABF limb TE, femfem BPG TE, Revision of Fem-pop w/ interposition, R tib TE, R 4 compartment fasciotomies, BRo (12/20/11) 4. L to R fem-fem BPG (10/02/10) 5. ABF (10/02/10)   All of his prior bypasses have occluded.  We has agreed to limit any further intervention unless he develops frank tissue loss.  The patient's symptoms have not progressed.  The patient's symptoms are: intermittent claudication. The patient's treatment regimen currently included: maximal medical management.  The patient's PMH, PSH, SH, and FamHx are unchanged from 02/15/16.   Current Outpatient Prescriptions  Medication Sig Dispense Refill  . amLODipine (NORVASC) 10 MG tablet Take 10 mg by mouth daily.     Marland Kitchen aspirin EC 81 MG tablet Take 81 mg by mouth daily.     . clopidogrel (PLAVIX) 75 MG tablet Take 75 mg by mouth daily.     Marland Kitchen glimepiride (AMARYL) 4 MG tablet Take 4 mg by mouth 2 (two) times daily.     . indomethacin (INDOCIN) 50 MG capsule Take 50 mg by mouth 2 (two) times daily as needed (gout flare-up). Reported on 08/01/2015    . insulin aspart (NOVOLOG) 100 UNIT/ML injection CBG 70 - 120: 0 units CBG 121 - 150: 2 units CBG 151 - 200: 3 units CBG 201 - 250: 5 units CBG 251 - 300: 8 units  Call MD if CBg is greater than 200 (Patient not taking: Reported on 02/15/2016) 1 vial 0  . Insulin Aspart Prot & Aspart (NOVOLOG MIX 70/30 [place]) Inject 100 Units into the skin 2 (two) times daily.    . insulin glargine (LANTUS) 100 UNIT/ML injection Inject 50 Units into the skin 2 (two) times daily. Reported on 08/01/2015    . insulin lispro (HUMALOG) 100 UNIT/ML injection Inject 10 Units into the skin 3  (three) times daily before meals. Reported on 08/01/2015    . lisinopril (PRINIVIL,ZESTRIL) 40 MG tablet Take 40 mg by mouth at bedtime.     . metFORMIN (GLUCOPHAGE-XR) 500 MG 24 hr tablet Take 1,000 mg by mouth 2 (two) times daily.     . metoprolol succinate (TOPROL-XL) 25 MG 24 hr tablet daily.    . Multiple Vitamin (MULTIVITAMIN WITH MINERALS) TABS tablet Take 1 tablet by mouth daily.    . Omega-3 Fatty Acids (FISH OIL) 1000 MG CAPS Take 2,000 mg by mouth 2 (two) times daily.     . ONE TOUCH ULTRA TEST test strip 3 (three) times daily. for testing  0  . polyethylene glycol-electrolytes (NULYTELY/GOLYTELY) 420 G solution Take 4,000 mLs by mouth once. (Patient not taking: Reported on 02/15/2016) 4000 mL 0  . simvastatin (ZOCOR) 20 MG tablet Take 20 mg by mouth at bedtime.      No current facility-administered medications for this visit.     Allergies  Allergen Reactions  . Aspirin Hives    Tolerates low dose aspirin  . Penicillins Rash and Other (See Comments)    Immune system shut down     On ROS today: no rest pain, no ulcers or gangrene   Physical Examination  Vitals:   06/07/16 1341  BP: 139/75  Pulse: 96  Resp:  20  Temp: 98.7 F (37.1 C)  TempSrc: Oral  SpO2: 98%  Weight: 229 lb (103.9 kg)  Height: 5\' 9"  (1.753 m)    Body mass index is 33.82 kg/m.  Body mass index is 33.08 kg/m.  General: Alert, O x 3, WD,NAD  Pulmonary: Sym exp, good B air movt,CTA B  Cardiac: RRR, Nl S1, S2, no Murmurs, No rubs, No S3,S4  Vascular: Vessel Right Left  Radial Palpable Palpable  Brachial Palpable Palpable  Carotid Palpable, No Bruit Palpable, No Bruit  Aorta Not palpable N/A  Femoral Not palpable Not palpable  Popliteal Not palpable Not palpable  PT Not palpable Not palpable  DP Not palpable Not palpable   Gastrointestinal: soft, non-distended, non-tender to palpation, No guarding or rebound, no HSM, no masses, no CVAT B, No palpable prominent aortic pulse,     Musculoskeletal: M/S 5/5 throughout, Extremities without ischemic changes, no ulcers or wounds on either feet, pes planus B  Neurologic: CN 2-12 intact , Pain and light touch intact in extremities , Motor exam as listed above   Non-Invasive Vascular Imaging ABI (Date: 06/06/2016)  R:   ABI: 0.43 (0.46),   DP: none  PT: mono  TBI:  0  L:   ABI: 0.53 (0.56),   DP: none  PT: mono  TBI: 0   Medical Decision Making  Logan [name] is a 71 y.o. male who presents with: Occluded ABF, Occluded L ax-fem BPG, Occluded fem-fem BPG, BLE intermittent claudication    Again, we discussed the medical mgmt including meticulous foot care given his lack of further revascularization options.  Based on the patient's vascular studies and examination, I have offered the patient: BLE ABI 6 months.  I discussed in depth with the patient the nature of atherosclerosis, and emphasized the importance of maximal medical management including strict control of blood pressure, blood glucose, and lipid levels, obtaining regular exercise, and cessation of smoking.    The patient is aware that without maximal medical management the underlying atherosclerotic disease process will progress, limiting the benefit of any interventions.  I discussed in depth with the patient the need for long term surveillance to improve the primary assisted patency of his bypass.  The patient agrees to cooperate with such. The patient is currently not a statin as not medically indicated. The patient is currently on an anti-platelet: Plavix.  Thank you for allowing [place] to participate in this patient's care.   Adele Barthel, MD, FACS [facility] Office: [phone] Pager: [phone]

## 2016-06-07 ENCOUNTER — Ambulatory Visit (INDEPENDENT_AMBULATORY_CARE_PROVIDER_SITE_OTHER): Payer: Medicare Other | Admitting: Vascular Surgery

## 2016-06-07 ENCOUNTER — Ambulatory Visit (HOSPITAL_COMMUNITY)
Admission: RE | Admit: 2016-06-07 | Discharge: 2016-06-07 | Disposition: A | Discharge status: Home / Self Care | Payer: Medicare Other | Source: Ambulatory Visit | Attending: Vascular Surgery | Admitting: Vascular Surgery

## 2016-06-07 ENCOUNTER — Encounter: Payer: Self-pay | Admitting: Vascular Surgery

## 2016-06-07 VITALS — BP 139/75 | HR 96 | Temp 98.7°F | Resp 20 | Ht 69.0 in | Wt 229.0 lb

## 2016-06-07 DIAGNOSIS — I70213 Atherosclerosis of native arteries of extremities with intermittent claudication, bilateral legs: Secondary | ICD-10-CM | POA: Diagnosis not present

## 2016-06-07 DIAGNOSIS — I998 Other disorder of circulatory system: Secondary | ICD-10-CM

## 2016-06-07 DIAGNOSIS — I70229 Atherosclerosis of native arteries of extremities with rest pain, unspecified extremity: Secondary | ICD-10-CM

## 2016-06-07 NOTE — Addendum Note (Signed)
Addended by: Lianne Cure A on: 06/07/2016 03:46 PM   Modules accepted: Orders

## 2016-06-12 ENCOUNTER — Encounter: Payer: Self-pay | Admitting: Cardiology

## 2016-07-10 DIAGNOSIS — I10 Essential (primary) hypertension: Secondary | ICD-10-CM | POA: Diagnosis not present

## 2016-07-10 DIAGNOSIS — J449 Chronic obstructive pulmonary disease, unspecified: Secondary | ICD-10-CM | POA: Diagnosis not present

## 2016-07-10 DIAGNOSIS — Z299 Encounter for prophylactic measures, unspecified: Secondary | ICD-10-CM | POA: Diagnosis not present

## 2016-07-10 DIAGNOSIS — I2510 Atherosclerotic heart disease of native coronary artery without angina pectoris: Secondary | ICD-10-CM | POA: Diagnosis not present

## 2016-07-10 DIAGNOSIS — R5383 Other fatigue: Secondary | ICD-10-CM | POA: Diagnosis not present

## 2016-07-10 DIAGNOSIS — E1022 Type 1 diabetes mellitus with diabetic chronic kidney disease: Secondary | ICD-10-CM | POA: Diagnosis not present

## 2016-07-10 DIAGNOSIS — Z6835 Body mass index (BMI) 35.0-35.9, adult: Secondary | ICD-10-CM | POA: Diagnosis not present

## 2016-07-10 DIAGNOSIS — N183 Chronic kidney disease, stage 3 (moderate): Secondary | ICD-10-CM | POA: Diagnosis not present

## 2016-07-10 DIAGNOSIS — Z1211 Encounter for screening for malignant neoplasm of colon: Secondary | ICD-10-CM | POA: Diagnosis not present

## 2016-07-10 DIAGNOSIS — Z1389 Encounter for screening for other disorder: Secondary | ICD-10-CM | POA: Diagnosis not present

## 2016-07-10 DIAGNOSIS — Z0000 Encounter for general adult medical examination without abnormal findings: Secondary | ICD-10-CM | POA: Diagnosis not present

## 2016-07-10 DIAGNOSIS — Z7189 Other specified counseling: Secondary | ICD-10-CM | POA: Diagnosis not present

## 2016-07-15 DIAGNOSIS — Z125 Encounter for screening for malignant neoplasm of prostate: Secondary | ICD-10-CM | POA: Diagnosis not present

## 2016-07-15 DIAGNOSIS — I2510 Atherosclerotic heart disease of native coronary artery without angina pectoris: Secondary | ICD-10-CM | POA: Diagnosis not present

## 2016-07-15 DIAGNOSIS — E1022 Type 1 diabetes mellitus with diabetic chronic kidney disease: Secondary | ICD-10-CM | POA: Diagnosis not present

## 2016-07-15 DIAGNOSIS — R5383 Other fatigue: Secondary | ICD-10-CM | POA: Diagnosis not present

## 2016-08-21 DIAGNOSIS — N183 Chronic kidney disease, stage 3 (moderate): Secondary | ICD-10-CM | POA: Diagnosis not present

## 2016-08-21 DIAGNOSIS — Z6835 Body mass index (BMI) 35.0-35.9, adult: Secondary | ICD-10-CM | POA: Diagnosis not present

## 2016-08-21 DIAGNOSIS — Z299 Encounter for prophylactic measures, unspecified: Secondary | ICD-10-CM | POA: Diagnosis not present

## 2016-08-21 DIAGNOSIS — I739 Peripheral vascular disease, unspecified: Secondary | ICD-10-CM | POA: Diagnosis not present

## 2016-08-21 DIAGNOSIS — G8220 Paraplegia, unspecified: Secondary | ICD-10-CM | POA: Diagnosis not present

## 2016-08-21 DIAGNOSIS — Z713 Dietary counseling and surveillance: Secondary | ICD-10-CM | POA: Diagnosis not present

## 2016-08-21 DIAGNOSIS — I2510 Atherosclerotic heart disease of native coronary artery without angina pectoris: Secondary | ICD-10-CM | POA: Diagnosis not present

## 2016-08-21 DIAGNOSIS — I10 Essential (primary) hypertension: Secondary | ICD-10-CM | POA: Diagnosis not present

## 2016-08-21 DIAGNOSIS — E7800 Pure hypercholesterolemia, unspecified: Secondary | ICD-10-CM | POA: Diagnosis not present

## 2016-08-21 DIAGNOSIS — E1122 Type 2 diabetes mellitus with diabetic chronic kidney disease: Secondary | ICD-10-CM | POA: Diagnosis not present

## 2016-08-21 DIAGNOSIS — J449 Chronic obstructive pulmonary disease, unspecified: Secondary | ICD-10-CM | POA: Diagnosis not present

## 2016-08-21 DIAGNOSIS — E669 Obesity, unspecified: Secondary | ICD-10-CM | POA: Diagnosis not present

## 2016-10-23 DIAGNOSIS — N183 Chronic kidney disease, stage 3 (moderate): Secondary | ICD-10-CM | POA: Diagnosis not present

## 2016-10-23 DIAGNOSIS — E1122 Type 2 diabetes mellitus with diabetic chronic kidney disease: Secondary | ICD-10-CM | POA: Diagnosis not present

## 2016-10-23 DIAGNOSIS — E7800 Pure hypercholesterolemia, unspecified: Secondary | ICD-10-CM | POA: Diagnosis not present

## 2016-10-23 DIAGNOSIS — J449 Chronic obstructive pulmonary disease, unspecified: Secondary | ICD-10-CM | POA: Diagnosis not present

## 2016-10-23 DIAGNOSIS — K219 Gastro-esophageal reflux disease without esophagitis: Secondary | ICD-10-CM | POA: Diagnosis not present

## 2016-10-23 DIAGNOSIS — G8220 Paraplegia, unspecified: Secondary | ICD-10-CM | POA: Diagnosis not present

## 2016-10-23 DIAGNOSIS — Z299 Encounter for prophylactic measures, unspecified: Secondary | ICD-10-CM | POA: Diagnosis not present

## 2016-10-23 DIAGNOSIS — I2510 Atherosclerotic heart disease of native coronary artery without angina pectoris: Secondary | ICD-10-CM | POA: Diagnosis not present

## 2016-10-23 DIAGNOSIS — Z6835 Body mass index (BMI) 35.0-35.9, adult: Secondary | ICD-10-CM | POA: Diagnosis not present

## 2016-10-23 DIAGNOSIS — I10 Essential (primary) hypertension: Secondary | ICD-10-CM | POA: Diagnosis not present

## 2016-10-23 DIAGNOSIS — I739 Peripheral vascular disease, unspecified: Secondary | ICD-10-CM | POA: Diagnosis not present

## 2016-10-23 DIAGNOSIS — E669 Obesity, unspecified: Secondary | ICD-10-CM | POA: Diagnosis not present

## 2016-10-28 ENCOUNTER — Encounter: Payer: Self-pay | Admitting: Family

## 2016-10-28 ENCOUNTER — Telehealth: Payer: Self-pay | Admitting: *Deleted

## 2016-10-28 NOTE — Telephone Encounter (Signed)
Logan [name] called to report that he has been having increased pain in his RLE since Friday 10-25-16. He doesn't recall any falls or injury but did say that he had been working on his car a lot. Patient states that he is having pain 24/7 even at rest. He does not report any ulcers or skin discoloration at this time.  He is s/p operations completed by Logan [name] including:                  L ax-PFA BPG (06/17/14) 1. Closure of R med calf fasciotomy, partial closure of R lateral calf fasciotomy, VAC (12/25/11) 2. L ABF limb TE, femfem BPG TE, Revision of Fem-pop w/ interposition, R tib TE, R 4 compartment fasciotomies, BRo (12/20/11) 3. L to R fem-fem BPG (10/02/10) 4. ABF (10/02/10)  All of his prior bypasses have occluded per Logan [name] note on 06-07-16.  I will move up his appt with our NP to come in tomorrow.  Patient voiced understanding and agreement with this plan.

## 2016-10-28 NOTE — Telephone Encounter (Signed)
Spoke to pt to sch appt 10/29/16 at 10:15.

## 2016-10-29 ENCOUNTER — Ambulatory Visit (INDEPENDENT_AMBULATORY_CARE_PROVIDER_SITE_OTHER): Payer: Medicare Other | Admitting: Family

## 2016-10-29 ENCOUNTER — Encounter: Payer: Self-pay | Admitting: Family

## 2016-10-29 VITALS — BP 116/69 | HR 83 | Temp 98.0°F | Resp 20 | Ht 69.0 in | Wt 226.0 lb

## 2016-10-29 DIAGNOSIS — I70213 Atherosclerosis of native arteries of extremities with intermittent claudication, bilateral legs: Secondary | ICD-10-CM

## 2016-10-29 DIAGNOSIS — Z95828 Presence of other vascular implants and grafts: Secondary | ICD-10-CM | POA: Diagnosis not present

## 2016-10-29 DIAGNOSIS — T82898S Other specified complication of vascular prosthetic devices, implants and grafts, sequela: Secondary | ICD-10-CM | POA: Diagnosis not present

## 2016-10-29 DIAGNOSIS — F17290 Nicotine dependence, other tobacco product, uncomplicated: Secondary | ICD-10-CM | POA: Diagnosis not present

## 2016-10-29 DIAGNOSIS — M25551 Pain in right hip: Secondary | ICD-10-CM

## 2016-10-29 NOTE — Progress Notes (Signed)
[facility]   CC: Follow up peripheral artery occlusive disease  History of Present Illness Logan [name] is a 71 y.o. male patient of Dr. Kellie Simmering had an aortobifemoral bypass graft with occlusion of the right limb. He also has a left-to-right femoral-femoral bypass graft on 12/20/11. He had a history of a draining sinus from the left inguinal area. This closed without any evidence of residual infection. Patient developed a scrotal abscess apparently at his 08/29/2011 visit and had a small draining sinus lateral to the right inguinal incision, this had healed and has not recurred. He is status post left axillary to profunda femoris bypass using 8 mm Hemashield Dacron graft on 06/17/14, and left lateral foot amputation by Dr. Ninfa Linden on 06/19/14.  He was last evaluated by Dr. Bridgett Larsson on 06-07-16. At that time pt had occluded ABF, Occluded L ax-fem BPG, Occluded fem-fem BPG, BLE intermittent claudication  Dr. Bridgett Larsson again discussed with pt the medical mgmt including meticulous foot care given his lack of further revascularization options. Dr. Bridgett Larsson offered the patient: BLE ABI 6 months.  He returns today after phone call from pt yesterday to report that he has been having increased pain in his RLE since Friday 10-25-16. He doesn't recall any falls or injury but did say that he had been working on his car a lot, laying down and squatting, the right hip pain started that evening. Patient states that he is having pain 24/7 even at rest, but that the pain is worse when he lays on the right hip. He does not report any ulcers or skin discoloration at this time. He does not indicate left leg claudication symptoms, does not indicate right lower leg symptoms.   Dr. Blanch Media is his podiatrist.  In the recent past he reported claudication in both calves with walking about a 1/2 block, relieved by rest. He had right hip pain after walking about a block.  He has recurrent ulcers in his scrotum, states  his PCP is aware and that he was told by a surgeon that debrided these ulcers that they would recur.  Pt denies any history of stroke or TIA. He has cardiac stents, did not experience any symptoms of an MI, but was told that he had an MI.  Pt Diabetic: Yes, states his last A1C was 7.3; last A1C result on file was 10.0 on 06-17-14  Pt smoker: resumed smoking cigars since his grand daughter died unexpected lyin 10-29-2014, sister died in 08-29-2015 (1 cigar every 2-4 days, stopped cigarettes in 08-29-10)  Pt meds include: Statin :Yes ASA: Yes Other anticoagulants/antiplatelets: clopidogrel, prescribed by Dr. Brigitte Pulse for his PAD, per pt     Past Medical History:  Diagnosis Date  . Anginal pain ([place])   . Cardiomyopathy ([place])   . Cataracts, bilateral   . Coronary artery disease    taxis DES stent circumflex 02/2003.... residual total distal circumflex and 50% LAD/ cardiolite 08/2006 inferior scar no ischemia  . Coronary artery disease   . Degenerative disc disease, lumbar   . Diabetes mellitus    type 2  . Dyslipidemia   . History of blood transfusion   . Hypertension   . Myocardial infarction ([place])   . Overweight(278.02)   . PVD (peripheral vascular disease) ([place])    right to left fem -fem bypass 1997/ aortabifemoral bypass 2003-08-29 right to left fem-fem Aug 28, 2004. Secondary to occluded righ tlimb of aortobifemoral bypass  . Shortness of breath   .  Tobacco abuse     Social History Social History  Substance Use Topics  . Smoking status: Former Smoker    Years: 40.00    Types: Cigars    Quit date: 04/20/2014  . Smokeless tobacco: Never Used     Comment: Quit x 2 years off and on  . Alcohol use 0.0 oz/week     Comment: liquior sometimes, seldom    Family History Family History  Problem Relation Age of Onset  . Hypertension Mother   . Varicose Veins Mother   . Hypertension Brother   . Colon cancer Neg Hx     Past Surgical History:  Procedure Laterality Date  . AMPUTATION  Left 06/19/2014   Procedure: Fifth toe amputaion, MPT Joint;  Surgeon: Mcarthur Rossetti, MD;  Location: [place];  Service: Orthopedics;  Laterality: Left;  . AORTO TO RIGHT COMMON FEMORAL AND LEFT PROFUNDA FEMORIS BYPASS GRAFT  09-28-2003 DR LAWSON   SEVERE AORTOILIAC OCCLUSIVE DISEASE W/ OCCLUDED FEM-FEM BYPASS OF BOTH LOWER EXTREMITIES  . AXILLARY-FEMORAL BYPASS GRAFT  12/20/2011   Procedure: BYPASS GRAFT AXILLA-BIFEMORAL;  Surgeon: Conrad [place], MD;  Location: [place];  Service: Vascular;  Laterality: Bilateral;  Left aorta bilateral femoral thrombectomy, femoral to femoral thrombectomy, and revision of Femoral to femoral graft. and Thrombectomy of Left tibial artery.  Marland Kitchen AXILLARY-FEMORAL BYPASS GRAFT Left 06/17/2014   Procedure:  LEFT AXILLA- LEFT FEMORAL ARTERTY BYPASS GRAFT;  Surgeon: Mal Misty, MD;  Location: [facility];  Service: Vascular;  Laterality: Left;  . COLONOSCOPY    . COLONOSCOPY N/A 03/28/2015   Procedure: COLONOSCOPY;  Surgeon: Daneil Dolin, MD;  [facility];  Service: Endoscopy;  Laterality: N/A;  0900 - moved to 9:30 - office to notify  . CORONARY ANGIOPLASTY WITH STENT PLACEMENT  02-23-2003  DR Lia Foyer   PCI W/ STENTING PROXIMAL CIRCUMFLEX  . DEBRIDEMENT LEG  12/25/11  . FASCIOTOMY  12/20/2011   Procedure: FASCIOTOMY;  Surgeon: Conrad [place], MD;  Location: [place];  Service: Vascular;  Laterality: Right;  right calf facsicotomy.  . FEMORAL-FEMORAL BYPASS GRAFT  1997  DR LAWSON   RIGHT TO LEFT  . ilecolonscopy  05/05/07   RMR:ana; papilla otherwise normal rectum pancolonic diverticula. two pedunculated polyps removed, tubular adenoma  . INCISION AND DRAINAGE OF WOUND  02/17/2012   Procedure: IRRIGATION AND DEBRIDEMENT WOUND;  Surgeon: Theodoro Kos, DO;  Location: [place];  Service: Plastics;  Laterality: Bilateral;  . KNEE SURGERY    . LEFT-TO-RIGHT FEMORAL-FEMORAL BYPASS GRAFT  07-30-2004  DR LAWSON   OCCLUSION RIGHT LIMB AORTOBIFEMORAL BYPASS  GRAFT  W/ SEVERE CLAUDICATION  . LOWER EXTREMITY ANGIOGRAM  12/20/2011   Procedure: LOWER EXTREMITY ANGIOGRAM;  Surgeon: Conrad [place], MD;  Location: [facility];  Service: Vascular;  Laterality: Bilateral;  Intraoperative Abdominal Aortogram with bilateral runoff  . LUMBAR LAMINECTOMY/DECOMPRESSION MICRODISCECTOMY  06/02/2012   Procedure: LUMBAR LAMINECTOMY/DECOMPRESSION MICRODISCECTOMY 1 LEVEL;  Surgeon: Tobi Bastos, MD;  Location: [facility];  Service: Orthopedics;  Laterality: N/A;  Central Decompression Lumbar Laminectomy L5-S1   . RIGHT LATERAL PAROTIDECTOMY WITH FACIAL VERVE PRESERVATION  02-25-2001  DR REDMAN   NEOPLASM RIGHT PAROTID GLAND  . SPINE SURGERY      Allergies  Allergen Reactions  . Aspirin Hives    Tolerates low dose aspirin  . Penicillins Rash and Other (See Comments)    Immune system shut down     Current Outpatient Prescriptions  Medication Sig Dispense Refill  .  amLODipine (NORVASC) 10 MG tablet Take 10 mg by mouth daily.     Marland Kitchen aspirin EC 81 MG tablet Take 81 mg by mouth daily.     . clopidogrel (PLAVIX) 75 MG tablet Take 75 mg by mouth daily.     . empagliflozin (JARDIANCE) 25 MG TABS tablet Take 25 mg by mouth daily.    Marland Kitchen glimepiride (AMARYL) 4 MG tablet Take 4 mg by mouth 2 (two) times daily.     . indomethacin (INDOCIN) 50 MG capsule Take 50 mg by mouth 2 (two) times daily as needed (gout flare-up). Reported on 08/01/2015    . insulin aspart (NOVOLOG) 100 UNIT/ML injection CBG 70 - 120: 0 units CBG 121 - 150: 2 units CBG 151 - 200: 3 units CBG 201 - 250: 5 units CBG 251 - 300: 8 units  Call MD if CBg is greater than 200 1 vial 0  . Insulin Aspart Prot & Aspart (NOVOLOG MIX 70/30 [place]) Inject 100 Units into the skin 2 (two) times daily.    . insulin glargine (LANTUS) 100 UNIT/ML injection Inject 50 Units into the skin 2 (two) times daily. Reported on 08/01/2015    . insulin lispro (HUMALOG) 100 UNIT/ML injection Inject 10 Units into the skin 3 (three) times daily  before meals. Reported on 08/01/2015    . lisinopril (PRINIVIL,ZESTRIL) 40 MG tablet Take 40 mg by mouth at bedtime.     . metFORMIN (GLUCOPHAGE-XR) 500 MG 24 hr tablet Take 1,000 mg by mouth 2 (two) times daily.     . metoprolol succinate (TOPROL-XL) 25 MG 24 hr tablet daily.    . Multiple Vitamin (MULTIVITAMIN WITH MINERALS) TABS tablet Take 1 tablet by mouth daily.    . Omega-3 Fatty Acids (FISH OIL) 1000 MG CAPS Take 2,000 mg by mouth 2 (two) times daily.     . ONE TOUCH ULTRA TEST test strip 3 (three) times daily. for testing  0  . polyethylene glycol-electrolytes (NULYTELY/GOLYTELY) 420 G solution Take 4,000 mLs by mouth once. 4000 mL 0  . simvastatin (ZOCOR) 20 MG tablet Take 20 mg by mouth at bedtime.      No current facility-administered medications for this visit.     ROS: See HPI for pertinent positives and negatives.   Physical Examination  Vitals:   10/29/16 1010  BP: 116/69  Pulse: 83  Resp: 20  Temp: 98 F (36.7 C)  TempSrc: Oral  SpO2: 98%  Weight: 226 lb (102.5 kg)  Height: 5\' 9"  (1.753 m)   Body mass index is 33.37 kg/m.  General: A&O x 3, WDWN, obese male. Gait: normal Eyes: Pupils are equal Pulmonary:Respirations are non labored, CTAB, distant breath sounds in posterior fields.  Cardiac: regular rhythm and rate, no detected murmur.     Carotid Bruits Right Left   Negative Negative  Abdominal aortic pulse is not palpable. Radial pulses: absent right radial, palpable right ulnar pulse,palpable left radial, palpable right brachial pulse.    VASCULAR EXAM: Extremitieswith no ischemic changes. Palpable left axillary to profunda femoris bypass graft at his visit on 08/01/15, not palpable at his 02-12-16 visit and today. Surgically absent left 5th toe to the MTP joint. No open wounds.       LE Pulses Right Left   FEMORAL not palpable, + Doppler signal Not palpable, + Doppler signal    POPLITEAL not palpable  not palpable   POSTERIOR TIBIAL not palpable, + Doppler signal not palpable    DORSALIS PEDIS  ANTERIOR  TIBIAL not palpable, + Doppler signal 1+palpable   PERONEAL not Palpable   not Palpable    Abdomen: soft, NT, no masses palpated. Healed surgical scar. Skin: no rashes, see extremities. Musculoskeletal: no muscle wasting or atrophy. See Extremities.  Neurologic: A&O X 3; Appropriate Affect, MOTOR FUNCTION: moving all extremities equally, motor strength 4/5 throughout. Speech is fluent/normal. CN 2-12 grossly intact.      Non-Invasive Vascular Imaging ABI (Date: 06/06/2016)  R:  ? ABI: 0.43 (0.46),  ? DP: none ? PT: mono ? TBI:  0  L:  ? ABI: 0.53 (0.56),  ? DP: none ? PT: mono ? TBI: 0   ASSESSMENT: DE JAWORSKI is a 71 y.o. male who is s/p aortobifemoral bypass graft with occlusion of the right limb. He also has a left-to-right femoral-femoral bypass graft on 12/20/11.  He is also status post left axillary to profunda femoris bypass using 8 mm Hemashield Dacron graft on 06/17/14 by Dr. Kellie Simmering, and left lateral foot amputation by Dr. Ninfa Linden on 06/19/14.  At his previous visit he had moderate claudication in both legs which is not life limiting, he has no rest pain, he has no signs of ischemia in his feet/legs. He has stopped the graduated walking program due to reported legs hurting when he walks; he denies any known lumbar spine problems.   His atherosclerotic risk factors include DM, former cigarette smoker and current occasional cigar smoker, CAD, sedentary lifestyle, and obesity.   He has an occluded ABF, Occluded L ax-fem BPG,  Occluded fem-fem BPG, BLE intermittent claudication. He needs medical mgmt including meticulous foot care given his lack of further revascularization options.  On 10-25-16, after working on his car which involved laying down and squatting, he developed right hip pain that evening, that was aggravated by laying on his right hip. Doppler signals present in right DP and PT. He was laying and bending working on his car earlier that day that the right hip pain suddenly started; right hip pain is more likely musco skeletal related; advised warm (not too hot) moist heat to right hip several times/day, acetaminophen, or Ben Gay type cream with ASA in the cream.    PLAN:  Graduated walking program again stressed and discussed. The patient was counseled re smoking cessation and given several free resources re smoking cessation.   Based on the patient's vascular studies and examination, and after discussing with Dr. Donnetta Hutching, pt will return to clinic in with ABI's, and see Dr. Bridgett Larsson, soonest available.  I discussed in depth with the patient the nature of atherosclerosis, and emphasized the importance of maximal medical management including strict control of blood pressure, blood glucose, and lipid levels, obtaining regular exercise, and cessation of smoking.  The patient is aware that without maximal medical management the underlying atherosclerotic disease process will progress, limiting the benefit of any interventions.  The patient was given information about PAD including signs, symptoms, treatment, what symptoms should prompt the patient to seek immediate medical care, and risk reduction measures to take.  Logan Chambers, RN, MSN, FNP-C [facility] Phone: [phone]  Clinic MD: Early  10/29/16 10:24 AM

## 2016-10-29 NOTE — Patient Instructions (Signed)

## 2016-11-01 ENCOUNTER — Ambulatory Visit: Payer: Medicare Other | Admitting: Vascular Surgery

## 2016-12-05 DIAGNOSIS — E1122 Type 2 diabetes mellitus with diabetic chronic kidney disease: Secondary | ICD-10-CM | POA: Diagnosis not present

## 2016-12-05 DIAGNOSIS — M47816 Spondylosis without myelopathy or radiculopathy, lumbar region: Secondary | ICD-10-CM | POA: Diagnosis not present

## 2016-12-05 DIAGNOSIS — I2510 Atherosclerotic heart disease of native coronary artery without angina pectoris: Secondary | ICD-10-CM | POA: Diagnosis not present

## 2016-12-05 DIAGNOSIS — Z299 Encounter for prophylactic measures, unspecified: Secondary | ICD-10-CM | POA: Diagnosis not present

## 2016-12-05 DIAGNOSIS — M160 Bilateral primary osteoarthritis of hip: Secondary | ICD-10-CM | POA: Diagnosis not present

## 2016-12-05 DIAGNOSIS — N183 Chronic kidney disease, stage 3 (moderate): Secondary | ICD-10-CM | POA: Diagnosis not present

## 2016-12-05 DIAGNOSIS — M5137 Other intervertebral disc degeneration, lumbosacral region: Secondary | ICD-10-CM | POA: Diagnosis not present

## 2016-12-05 DIAGNOSIS — M47898 Other spondylosis, sacral and sacrococcygeal region: Secondary | ICD-10-CM | POA: Diagnosis not present

## 2016-12-05 DIAGNOSIS — M25551 Pain in right hip: Secondary | ICD-10-CM | POA: Diagnosis not present

## 2016-12-05 DIAGNOSIS — E7800 Pure hypercholesterolemia, unspecified: Secondary | ICD-10-CM | POA: Diagnosis not present

## 2016-12-05 DIAGNOSIS — Z6834 Body mass index (BMI) 34.0-34.9, adult: Secondary | ICD-10-CM | POA: Diagnosis not present

## 2016-12-05 DIAGNOSIS — J449 Chronic obstructive pulmonary disease, unspecified: Secondary | ICD-10-CM | POA: Diagnosis not present

## 2016-12-05 DIAGNOSIS — I739 Peripheral vascular disease, unspecified: Secondary | ICD-10-CM | POA: Diagnosis not present

## 2016-12-05 DIAGNOSIS — M545 Low back pain: Secondary | ICD-10-CM | POA: Diagnosis not present

## 2016-12-05 DIAGNOSIS — I10 Essential (primary) hypertension: Secondary | ICD-10-CM | POA: Diagnosis not present

## 2016-12-05 DIAGNOSIS — M79606 Pain in leg, unspecified: Secondary | ICD-10-CM | POA: Diagnosis not present

## 2016-12-06 ENCOUNTER — Encounter (HOSPITAL_COMMUNITY): Payer: Medicare Other

## 2016-12-06 ENCOUNTER — Ambulatory Visit: Payer: Medicare Other | Admitting: Vascular Surgery

## 2016-12-06 ENCOUNTER — Ambulatory Visit: Payer: Medicare Other

## 2016-12-24 ENCOUNTER — Encounter: Payer: Self-pay | Admitting: Vascular Surgery

## 2016-12-26 NOTE — Progress Notes (Signed)
Established Intermittent Claudication   History of Present Illness   Logan [name] is a 71 y.o. ([birth year]) male who presents with chief complaint: right hip pain.  Previous operations completed by Dr. Kellie Simmering include:   1. L ax-PFA BPG (06/17/14) 2. Closure of R med calf fasciotomy, partial closure of R lateral calf fasciotomy, VAC (12/25/11) 3. L ABF limb TE, femfem BPG TE, Revision of Fem-pop w/ interposition, R tib TE, R 4 compartment fasciotomies, BRo (12/20/11) 4. L to R fem-fem BPG (10/02/10) 5. ABF (10/02/10)  All of these bypasses had previously occluded.  The patient's symptoms have progressed.  His right hips is described as aching without any neurologic sx.  His hip is severe enough to limit ambulation.  Pt denies intermittent claudication or rest pain.  The patient's treatment regimen currently included: maximal medical management.    The patient's PMH, PSH, and SH, and FamHx are unchanged from 10/29/16.  Current Outpatient Prescriptions  Medication Sig Dispense Refill  . amLODipine (NORVASC) 10 MG tablet Take 10 mg by mouth daily.     Marland Kitchen aspirin EC 81 MG tablet Take 81 mg by mouth daily.     . clopidogrel (PLAVIX) 75 MG tablet Take 75 mg by mouth daily.     . empagliflozin (JARDIANCE) 25 MG TABS tablet Take 25 mg by mouth daily.    Marland Kitchen glimepiride (AMARYL) 4 MG tablet Take 4 mg by mouth 2 (two) times daily.     . indomethacin (INDOCIN) 50 MG capsule Take 50 mg by mouth 2 (two) times daily as needed (gout flare-up). Reported on 08/01/2015    . insulin aspart (NOVOLOG) 100 UNIT/ML injection CBG 70 - 120: 0 units CBG 121 - 150: 2 units CBG 151 - 200: 3 units CBG 201 - 250: 5 units CBG 251 - 300: 8 units  Call MD if CBg is greater than 200 1 vial 0  . Insulin Aspart Prot & Aspart (NOVOLOG MIX 70/30 [place]) Inject 100 Units into the skin 2 (two) times daily.    . insulin glargine (LANTUS) 100 UNIT/ML injection Inject 50 Units into the skin 2 (two) times daily. Reported on 08/01/2015      . insulin lispro (HUMALOG) 100 UNIT/ML injection Inject 10 Units into the skin 3 (three) times daily before meals. Reported on 08/01/2015    . lisinopril (PRINIVIL,ZESTRIL) 40 MG tablet Take 40 mg by mouth at bedtime.     . metFORMIN (GLUCOPHAGE-XR) 500 MG 24 hr tablet Take 1,000 mg by mouth 2 (two) times daily.     . metoprolol succinate (TOPROL-XL) 25 MG 24 hr tablet daily.    . Multiple Vitamin (MULTIVITAMIN WITH MINERALS) TABS tablet Take 1 tablet by mouth daily.    . Omega-3 Fatty Acids (FISH OIL) 1000 MG CAPS Take 2,000 mg by mouth 2 (two) times daily.     . ONE TOUCH ULTRA TEST test strip 3 (three) times daily. for testing  0  . polyethylene glycol-electrolytes (NULYTELY/GOLYTELY) 420 G solution Take 4,000 mLs by mouth once. 4000 mL 0  . simvastatin (ZOCOR) 20 MG tablet Take 20 mg by mouth at bedtime.      No current facility-administered medications for this visit.     On ROS today: right hip pain, no rest pain or intermittent claudication .   Physical Examination   Vitals:   01/01/17 1023  BP: 121/64  Pulse: 80  Resp: 16  Temp: 98.4 F (36.9 C)  TempSrc: Oral  SpO2: 97%  Weight: 229 lb 1.9 oz (103.9 kg)  Height: 5\' 9"  (1.753 m)   Body mass index is 33.84 kg/m.  General Alert, O x 3, WD, NAD  Pulmonary Sym exp, good B air movt, CTA B  Cardiac RRR, Nl S1, S2, no Murmurs, No rubs, No S3,S4  Vascular Vessel Right Left  Radial Not palpable Palpable  Brachial Not palpable Palpable  Carotid Palpable, No Bruit Palpable, No Bruit  Aorta Not palpable N/A  Femoral Palpable Palpable  Popliteal Not palpable Not palpable  PT Not palpable Not palpable  DP Not palpable Not palpable    Gastro- intestinal soft, non-distended, non-tender to palpation, No guarding or rebound, no HSM, no masses, no CVAT B, No palpable prominent aortic pulse,    Musculo- skeletal M/S 5/5 throughout  , Extremities without ischemic changes  , No edema present, No visible varicosities , No  Lipodermatosclerosis present, multiple healed incision, some ballotable tissue in medial left calf  Neurologic Pain and light touch intact in extremities , Motor exam as listed above    Non-Invasive Vascular imaging   ABI (01/01/2017)  R:   ABI: 0.47 (0.43),   PT: mono  DP: mono  TBI:  0.42 (0)  L:   ABI: 0.45 (0.52),   PT: mono  DP: mono  TBI: 0.29 (0)   Medical Decision Making   Logan [name] is a 71 y.o. male who presents with:  Right hip pain, aortoiliac occlusion without evidence of critical limb ischemia   Unclear whether right hip is is right aortoiliac disease vs osteoarthritis given ABI look better today and I doubt the aortoiliac occlusion has extended.  This patient has undergone multiple operations at this point, which likely has compromised his B common femoral arteries.  Another redo surgery would not only be difficult but also potentially dangerous.  Based on the patient's vascular studies and examination, I have offered the patient: Left brachial artery cannulation, aortogram, bilateral leg runoff to see if any revascularization targets are left. I discussed with the patient the nature of angiographic procedures, especially the limited patencies of any endovascular intervention.   The patient is aware of that the risks of an angiographic procedure include but are not limited to: bleeding, infection, access site complications, renal failure, embolization, rupture of vessel, dissection, arteriovenous fistula, possible need for emergent surgical intervention, possible need for surgical procedures to treat the patient's pathology, anaphylactic reaction to contrast, and stroke and death.   The patient is aware of the risks and agrees to proceed.  He will be scheduled for 23 AUG 18.  I discussed in depth with the patient the nature of atherosclerosis, and emphasized the importance of maximal medical management including strict control of blood pressure, blood  glucose, and lipid levels, antiplatelet agents, obtaining regular exercise, and cessation of smoking.    The patient is aware that without maximal medical management the underlying atherosclerotic disease process will progress, limiting the benefit of any interventions. The patient is currently on a statin: Zocor.  The patient is currently on an anti-platelet: ASA and Plavix.  Additionally, I think getting an Orthopedic evaluation of the right hip will be essential in determining any necessary intervention.    I am reluctant to consider extensive redo surgery if the etiology of his pain is musculoskeletal in nature.    Thank you for allowing [place] to participate in this patient's care.   Adele Barthel, MD, FACS [facility] Office: [phone] Pager: [phone]

## 2017-01-01 ENCOUNTER — Encounter: Payer: Self-pay | Admitting: *Deleted

## 2017-01-01 ENCOUNTER — Other Ambulatory Visit: Payer: Self-pay | Admitting: *Deleted

## 2017-01-01 ENCOUNTER — Encounter: Payer: Self-pay | Admitting: Vascular Surgery

## 2017-01-01 ENCOUNTER — Ambulatory Visit (INDEPENDENT_AMBULATORY_CARE_PROVIDER_SITE_OTHER): Payer: Medicare Other | Admitting: Vascular Surgery

## 2017-01-01 ENCOUNTER — Ambulatory Visit (HOSPITAL_COMMUNITY)
Admission: RE | Admit: 2017-01-01 | Discharge: 2017-01-01 | Disposition: A | Discharge status: Home / Self Care | Payer: Medicare Other | Source: Ambulatory Visit | Attending: Vascular Surgery | Admitting: Vascular Surgery

## 2017-01-01 DIAGNOSIS — I7409 Other arterial embolism and thrombosis of abdominal aorta: Secondary | ICD-10-CM | POA: Diagnosis not present

## 2017-01-01 DIAGNOSIS — I70213 Atherosclerosis of native arteries of extremities with intermittent claudication, bilateral legs: Secondary | ICD-10-CM

## 2017-01-01 DIAGNOSIS — I70203 Unspecified atherosclerosis of native arteries of extremities, bilateral legs: Secondary | ICD-10-CM | POA: Insufficient documentation

## 2017-01-01 DIAGNOSIS — I998 Other disorder of circulatory system: Secondary | ICD-10-CM | POA: Diagnosis not present

## 2017-01-01 DIAGNOSIS — I70229 Atherosclerosis of native arteries of extremities with rest pain, unspecified extremity: Secondary | ICD-10-CM

## 2017-01-09 ENCOUNTER — Encounter (HOSPITAL_COMMUNITY): Admission: RE | Payer: Self-pay | Source: Ambulatory Visit

## 2017-01-09 ENCOUNTER — Ambulatory Visit (HOSPITAL_COMMUNITY): Admission: RE | Admit: 2017-01-09 | Payer: Medicare Other | Source: Ambulatory Visit | Admitting: Vascular Surgery

## 2017-01-09 SURGERY — ABDOMINAL AORTOGRAM W/LOWER EXTREMITY
Anesthesia: LOCAL

## 2017-01-31 ENCOUNTER — Telehealth: Payer: Self-pay

## 2017-01-31 NOTE — Telephone Encounter (Signed)
Phone call placed to pt's home to attempt to reschedule the Aortogram with bilateral runoff; possible intervention.  Left voice message to call office to reschedule.

## 2017-07-15 DIAGNOSIS — N183 Chronic kidney disease, stage 3 (moderate): Secondary | ICD-10-CM | POA: Diagnosis not present

## 2017-07-15 DIAGNOSIS — Z299 Encounter for prophylactic measures, unspecified: Secondary | ICD-10-CM | POA: Diagnosis not present

## 2017-07-15 DIAGNOSIS — I10 Essential (primary) hypertension: Secondary | ICD-10-CM | POA: Diagnosis not present

## 2017-07-15 DIAGNOSIS — Z1339 Encounter for screening examination for other mental health and behavioral disorders: Secondary | ICD-10-CM | POA: Diagnosis not present

## 2017-07-15 DIAGNOSIS — Z79899 Other long term (current) drug therapy: Secondary | ICD-10-CM | POA: Diagnosis not present

## 2017-07-15 DIAGNOSIS — E1165 Type 2 diabetes mellitus with hyperglycemia: Secondary | ICD-10-CM | POA: Diagnosis not present

## 2017-07-15 DIAGNOSIS — Z1211 Encounter for screening for malignant neoplasm of colon: Secondary | ICD-10-CM | POA: Diagnosis not present

## 2017-07-15 DIAGNOSIS — Z1331 Encounter for screening for depression: Secondary | ICD-10-CM | POA: Diagnosis not present

## 2017-07-15 DIAGNOSIS — Z0000 Encounter for general adult medical examination without abnormal findings: Secondary | ICD-10-CM | POA: Diagnosis not present

## 2017-07-15 DIAGNOSIS — R5383 Other fatigue: Secondary | ICD-10-CM | POA: Diagnosis not present

## 2017-07-15 DIAGNOSIS — Z6834 Body mass index (BMI) 34.0-34.9, adult: Secondary | ICD-10-CM | POA: Diagnosis not present

## 2017-07-15 DIAGNOSIS — Z7189 Other specified counseling: Secondary | ICD-10-CM | POA: Diagnosis not present

## 2017-07-16 DIAGNOSIS — R5383 Other fatigue: Secondary | ICD-10-CM | POA: Diagnosis not present

## 2017-07-16 DIAGNOSIS — Z79899 Other long term (current) drug therapy: Secondary | ICD-10-CM | POA: Diagnosis not present

## 2017-07-16 DIAGNOSIS — I10 Essential (primary) hypertension: Secondary | ICD-10-CM | POA: Diagnosis not present

## 2017-07-16 DIAGNOSIS — Z125 Encounter for screening for malignant neoplasm of prostate: Secondary | ICD-10-CM | POA: Diagnosis not present

## 2018-04-23 DIAGNOSIS — Z299 Encounter for prophylactic measures, unspecified: Secondary | ICD-10-CM | POA: Diagnosis not present

## 2018-04-23 DIAGNOSIS — I10 Essential (primary) hypertension: Secondary | ICD-10-CM | POA: Diagnosis not present

## 2018-04-23 DIAGNOSIS — E1165 Type 2 diabetes mellitus with hyperglycemia: Secondary | ICD-10-CM | POA: Diagnosis not present

## 2018-04-23 DIAGNOSIS — F17210 Nicotine dependence, cigarettes, uncomplicated: Secondary | ICD-10-CM | POA: Diagnosis not present

## 2018-04-23 DIAGNOSIS — J449 Chronic obstructive pulmonary disease, unspecified: Secondary | ICD-10-CM | POA: Diagnosis not present

## 2018-04-23 DIAGNOSIS — J069 Acute upper respiratory infection, unspecified: Secondary | ICD-10-CM | POA: Diagnosis not present

## 2018-04-23 DIAGNOSIS — Z6833 Body mass index (BMI) 33.0-33.9, adult: Secondary | ICD-10-CM | POA: Diagnosis not present

## 2018-04-23 DIAGNOSIS — I739 Peripheral vascular disease, unspecified: Secondary | ICD-10-CM | POA: Diagnosis not present

## 2018-06-05 DIAGNOSIS — N481 Balanitis: Secondary | ICD-10-CM | POA: Diagnosis not present

## 2018-10-20 DIAGNOSIS — Z6833 Body mass index (BMI) 33.0-33.9, adult: Secondary | ICD-10-CM | POA: Diagnosis not present

## 2018-10-20 DIAGNOSIS — Z299 Encounter for prophylactic measures, unspecified: Secondary | ICD-10-CM | POA: Diagnosis not present

## 2018-10-20 DIAGNOSIS — E1122 Type 2 diabetes mellitus with diabetic chronic kidney disease: Secondary | ICD-10-CM | POA: Diagnosis not present

## 2018-10-20 DIAGNOSIS — N183 Chronic kidney disease, stage 3 (moderate): Secondary | ICD-10-CM | POA: Diagnosis not present

## 2018-10-20 DIAGNOSIS — I10 Essential (primary) hypertension: Secondary | ICD-10-CM | POA: Diagnosis not present

## 2018-10-20 DIAGNOSIS — E1165 Type 2 diabetes mellitus with hyperglycemia: Secondary | ICD-10-CM | POA: Diagnosis not present

## 2018-11-23 DIAGNOSIS — Z0000 Encounter for general adult medical examination without abnormal findings: Secondary | ICD-10-CM | POA: Diagnosis not present

## 2018-11-23 DIAGNOSIS — E1165 Type 2 diabetes mellitus with hyperglycemia: Secondary | ICD-10-CM | POA: Diagnosis not present

## 2018-11-23 DIAGNOSIS — Z7189 Other specified counseling: Secondary | ICD-10-CM | POA: Diagnosis not present

## 2018-11-23 DIAGNOSIS — E7800 Pure hypercholesterolemia, unspecified: Secondary | ICD-10-CM | POA: Diagnosis not present

## 2018-11-23 DIAGNOSIS — Z1339 Encounter for screening examination for other mental health and behavioral disorders: Secondary | ICD-10-CM | POA: Diagnosis not present

## 2018-11-23 DIAGNOSIS — R5383 Other fatigue: Secondary | ICD-10-CM | POA: Diagnosis not present

## 2018-11-23 DIAGNOSIS — Z1211 Encounter for screening for malignant neoplasm of colon: Secondary | ICD-10-CM | POA: Diagnosis not present

## 2018-11-23 DIAGNOSIS — Z6834 Body mass index (BMI) 34.0-34.9, adult: Secondary | ICD-10-CM | POA: Diagnosis not present

## 2018-11-23 DIAGNOSIS — Z299 Encounter for prophylactic measures, unspecified: Secondary | ICD-10-CM | POA: Diagnosis not present

## 2018-11-23 DIAGNOSIS — Z1331 Encounter for screening for depression: Secondary | ICD-10-CM | POA: Diagnosis not present

## 2018-11-24 DIAGNOSIS — Z125 Encounter for screening for malignant neoplasm of prostate: Secondary | ICD-10-CM | POA: Diagnosis not present

## 2018-11-24 DIAGNOSIS — Z79899 Other long term (current) drug therapy: Secondary | ICD-10-CM | POA: Diagnosis not present

## 2018-11-24 DIAGNOSIS — R5383 Other fatigue: Secondary | ICD-10-CM | POA: Diagnosis not present

## 2018-11-24 DIAGNOSIS — E7800 Pure hypercholesterolemia, unspecified: Secondary | ICD-10-CM | POA: Diagnosis not present

## 2019-01-04 DIAGNOSIS — I70211 Atherosclerosis of native arteries of extremities with intermittent claudication, right leg: Secondary | ICD-10-CM | POA: Diagnosis not present

## 2019-01-04 DIAGNOSIS — I70203 Unspecified atherosclerosis of native arteries of extremities, bilateral legs: Secondary | ICD-10-CM | POA: Diagnosis not present

## 2019-01-26 DIAGNOSIS — E1165 Type 2 diabetes mellitus with hyperglycemia: Secondary | ICD-10-CM | POA: Diagnosis not present

## 2019-01-26 DIAGNOSIS — N183 Chronic kidney disease, stage 3 (moderate): Secondary | ICD-10-CM | POA: Diagnosis not present

## 2019-01-26 DIAGNOSIS — I10 Essential (primary) hypertension: Secondary | ICD-10-CM | POA: Diagnosis not present

## 2019-01-26 DIAGNOSIS — Z6831 Body mass index (BMI) 31.0-31.9, adult: Secondary | ICD-10-CM | POA: Diagnosis not present

## 2019-01-26 DIAGNOSIS — F17210 Nicotine dependence, cigarettes, uncomplicated: Secondary | ICD-10-CM | POA: Diagnosis not present

## 2019-01-26 DIAGNOSIS — I739 Peripheral vascular disease, unspecified: Secondary | ICD-10-CM | POA: Diagnosis not present

## 2019-01-26 DIAGNOSIS — Z299 Encounter for prophylactic measures, unspecified: Secondary | ICD-10-CM | POA: Diagnosis not present

## 2019-01-26 DIAGNOSIS — E1122 Type 2 diabetes mellitus with diabetic chronic kidney disease: Secondary | ICD-10-CM | POA: Diagnosis not present

## 2019-09-07 DIAGNOSIS — Z6833 Body mass index (BMI) 33.0-33.9, adult: Secondary | ICD-10-CM | POA: Diagnosis not present

## 2019-09-07 DIAGNOSIS — K529 Noninfective gastroenteritis and colitis, unspecified: Secondary | ICD-10-CM | POA: Diagnosis not present

## 2019-09-07 DIAGNOSIS — I10 Essential (primary) hypertension: Secondary | ICD-10-CM | POA: Diagnosis not present

## 2019-09-07 DIAGNOSIS — Z299 Encounter for prophylactic measures, unspecified: Secondary | ICD-10-CM | POA: Diagnosis not present

## 2019-09-07 DIAGNOSIS — F17210 Nicotine dependence, cigarettes, uncomplicated: Secondary | ICD-10-CM | POA: Diagnosis not present

## 2019-09-07 DIAGNOSIS — S98919A Complete traumatic amputation of unspecified foot, level unspecified, initial encounter: Secondary | ICD-10-CM | POA: Diagnosis not present

## 2019-09-07 DIAGNOSIS — E1165 Type 2 diabetes mellitus with hyperglycemia: Secondary | ICD-10-CM | POA: Diagnosis not present

## 2019-11-30 DIAGNOSIS — Z0000 Encounter for general adult medical examination without abnormal findings: Secondary | ICD-10-CM | POA: Diagnosis not present

## 2019-11-30 DIAGNOSIS — R0602 Shortness of breath: Secondary | ICD-10-CM | POA: Diagnosis not present

## 2019-11-30 DIAGNOSIS — Z299 Encounter for prophylactic measures, unspecified: Secondary | ICD-10-CM | POA: Diagnosis not present

## 2019-11-30 DIAGNOSIS — Z6833 Body mass index (BMI) 33.0-33.9, adult: Secondary | ICD-10-CM | POA: Diagnosis not present

## 2019-11-30 DIAGNOSIS — Z1339 Encounter for screening examination for other mental health and behavioral disorders: Secondary | ICD-10-CM | POA: Diagnosis not present

## 2019-11-30 DIAGNOSIS — Z7189 Other specified counseling: Secondary | ICD-10-CM | POA: Diagnosis not present

## 2019-11-30 DIAGNOSIS — E1165 Type 2 diabetes mellitus with hyperglycemia: Secondary | ICD-10-CM | POA: Diagnosis not present

## 2019-11-30 DIAGNOSIS — S98919A Complete traumatic amputation of unspecified foot, level unspecified, initial encounter: Secondary | ICD-10-CM | POA: Diagnosis not present

## 2019-11-30 DIAGNOSIS — Z87891 Personal history of nicotine dependence: Secondary | ICD-10-CM | POA: Diagnosis not present

## 2019-11-30 DIAGNOSIS — Z1331 Encounter for screening for depression: Secondary | ICD-10-CM | POA: Diagnosis not present

## 2019-12-02 DIAGNOSIS — R0600 Dyspnea, unspecified: Secondary | ICD-10-CM | POA: Diagnosis not present

## 2019-12-02 DIAGNOSIS — R0602 Shortness of breath: Secondary | ICD-10-CM | POA: Diagnosis not present

## 2019-12-26 DIAGNOSIS — L02224 Furuncle of groin: Secondary | ICD-10-CM | POA: Diagnosis not present

## 2019-12-26 DIAGNOSIS — Z760 Encounter for issue of repeat prescription: Secondary | ICD-10-CM | POA: Diagnosis not present

## 2020-02-17 DIAGNOSIS — I129 Hypertensive chronic kidney disease with stage 1 through stage 4 chronic kidney disease, or unspecified chronic kidney disease: Secondary | ICD-10-CM | POA: Diagnosis not present

## 2020-02-17 DIAGNOSIS — N183 Chronic kidney disease, stage 3 unspecified: Secondary | ICD-10-CM | POA: Diagnosis not present

## 2020-02-17 DIAGNOSIS — E7849 Other hyperlipidemia: Secondary | ICD-10-CM | POA: Diagnosis not present

## 2020-02-17 DIAGNOSIS — E1122 Type 2 diabetes mellitus with diabetic chronic kidney disease: Secondary | ICD-10-CM | POA: Diagnosis not present

## 2020-03-06 DIAGNOSIS — E1165 Type 2 diabetes mellitus with hyperglycemia: Secondary | ICD-10-CM | POA: Diagnosis not present

## 2020-03-06 DIAGNOSIS — Z6832 Body mass index (BMI) 32.0-32.9, adult: Secondary | ICD-10-CM | POA: Diagnosis not present

## 2020-03-06 DIAGNOSIS — H6012 Cellulitis of left external ear: Secondary | ICD-10-CM | POA: Diagnosis not present

## 2020-03-06 DIAGNOSIS — Z299 Encounter for prophylactic measures, unspecified: Secondary | ICD-10-CM | POA: Diagnosis not present

## 2020-03-06 DIAGNOSIS — I10 Essential (primary) hypertension: Secondary | ICD-10-CM | POA: Diagnosis not present

## 2020-03-06 DIAGNOSIS — F17210 Nicotine dependence, cigarettes, uncomplicated: Secondary | ICD-10-CM | POA: Diagnosis not present

## 2020-03-06 DIAGNOSIS — S98919A Complete traumatic amputation of unspecified foot, level unspecified, initial encounter: Secondary | ICD-10-CM | POA: Diagnosis not present

## 2020-03-16 DIAGNOSIS — I10 Essential (primary) hypertension: Secondary | ICD-10-CM | POA: Diagnosis not present

## 2020-03-16 DIAGNOSIS — Z299 Encounter for prophylactic measures, unspecified: Secondary | ICD-10-CM | POA: Diagnosis not present

## 2020-03-16 DIAGNOSIS — L0390 Cellulitis, unspecified: Secondary | ICD-10-CM | POA: Diagnosis not present

## 2020-03-16 DIAGNOSIS — E1165 Type 2 diabetes mellitus with hyperglycemia: Secondary | ICD-10-CM | POA: Diagnosis not present

## 2020-03-16 DIAGNOSIS — E1122 Type 2 diabetes mellitus with diabetic chronic kidney disease: Secondary | ICD-10-CM | POA: Diagnosis not present

## 2020-03-16 DIAGNOSIS — L0291 Cutaneous abscess, unspecified: Secondary | ICD-10-CM | POA: Diagnosis not present

## 2020-03-17 DIAGNOSIS — I129 Hypertensive chronic kidney disease with stage 1 through stage 4 chronic kidney disease, or unspecified chronic kidney disease: Secondary | ICD-10-CM | POA: Diagnosis not present

## 2020-03-17 DIAGNOSIS — E7849 Other hyperlipidemia: Secondary | ICD-10-CM | POA: Diagnosis not present

## 2020-03-17 DIAGNOSIS — E1122 Type 2 diabetes mellitus with diabetic chronic kidney disease: Secondary | ICD-10-CM | POA: Diagnosis not present

## 2020-03-17 DIAGNOSIS — N183 Chronic kidney disease, stage 3 unspecified: Secondary | ICD-10-CM | POA: Diagnosis not present

## 2020-03-31 ENCOUNTER — Encounter: Payer: Self-pay | Admitting: Internal Medicine

## 2020-04-18 DIAGNOSIS — H6692 Otitis media, unspecified, left ear: Secondary | ICD-10-CM | POA: Diagnosis not present

## 2020-04-18 DIAGNOSIS — N183 Chronic kidney disease, stage 3 unspecified: Secondary | ICD-10-CM | POA: Diagnosis not present

## 2020-04-18 DIAGNOSIS — Z2821 Immunization not carried out because of patient refusal: Secondary | ICD-10-CM | POA: Diagnosis not present

## 2020-04-18 DIAGNOSIS — Z299 Encounter for prophylactic measures, unspecified: Secondary | ICD-10-CM | POA: Diagnosis not present

## 2020-04-18 DIAGNOSIS — I10 Essential (primary) hypertension: Secondary | ICD-10-CM | POA: Diagnosis not present

## 2020-04-18 DIAGNOSIS — Z6833 Body mass index (BMI) 33.0-33.9, adult: Secondary | ICD-10-CM | POA: Diagnosis not present

## 2020-04-20 ENCOUNTER — Ambulatory Visit: Payer: Medicare Other | Attending: Internal Medicine

## 2020-04-20 DIAGNOSIS — Z23 Encounter for immunization: Secondary | ICD-10-CM

## 2020-04-20 NOTE — Progress Notes (Signed)
   Covid-19 Vaccination Clinic  Name:  NEVAAN BUNTON    MRN: [id number] DOB: 1945/10/28  04/20/2020  Mr. Brunton was observed post Covid-19 immunization for 15 minutes without incident. He was provided with Vaccine Information Sheet and instruction to access the V-Safe system.   Mr. Judice was instructed to call 911 with any severe reactions post vaccine: Marland Kitchen Difficulty breathing  . Swelling of face and throat  . A fast heartbeat  . A bad rash all over body  . Dizziness and weakness   Immunizations Administered    Name Date Dose VIS Date Route   JANSSEN COVID-19 VACCINE 04/20/2020  2:59 PM 0.5 mL 03/08/2020 Intramuscular   Manufacturer: [employer]   Lot: [id number]   [place]: [id number]

## 2020-05-26 DIAGNOSIS — H6121 Impacted cerumen, right ear: Secondary | ICD-10-CM | POA: Diagnosis not present

## 2020-05-26 DIAGNOSIS — H60332 Swimmer's ear, left ear: Secondary | ICD-10-CM | POA: Diagnosis not present

## 2020-05-31 ENCOUNTER — Ambulatory Visit (INDEPENDENT_AMBULATORY_CARE_PROVIDER_SITE_OTHER): Payer: Medicare Other | Admitting: Gastroenterology

## 2020-05-31 ENCOUNTER — Encounter: Payer: Self-pay | Admitting: Gastroenterology

## 2020-05-31 ENCOUNTER — Other Ambulatory Visit: Payer: Self-pay

## 2020-05-31 DIAGNOSIS — Z86010 Personal history of colonic polyps: Secondary | ICD-10-CM

## 2020-05-31 DIAGNOSIS — Z860101 Personal history of adenomatous and serrated colon polyps: Secondary | ICD-10-CM | POA: Insufficient documentation

## 2020-05-31 NOTE — Progress Notes (Signed)
Primary Care Physician:  Monico Blitz, MD  Primary Gastroenterologist:  Garfield Cornea, MD   Chief Complaint  Patient presents with  . Colonoscopy    Due for tcs. Doing ok    HPI:  Logan [name] is a 75 y.o. male here to schedule surveillance colonoscopy.  In 2008 he had a tubular adenoma removed from the descending colon.  Surveillance colonoscopy in 2016, single hyperplastic polyp removed.  Denies family history of colon cancer.  From a GI standpoint he does well.  Bowel movements regular.  No blood in the stool or melena.  No abdominal pain.  Appetite good.  No unintentional weight loss.  No heartburn, vomiting, dysphagia  We discussed current guidelines regarding ongoing surveillance/screening colonoscopies.  Given his age it would be appropriate to forego any future colonoscopies for screening/surveillance purposes.  However if he were to have any problems such as change in bowel habits, anemia, blood in the stool etc. he would be a candidate for diagnostic colonoscopy.    Current Outpatient Medications  Medication Sig Dispense Refill  . amLODipine (NORVASC) 10 MG tablet Take 10 mg by mouth daily.     Marland Kitchen aspirin EC 81 MG tablet Take 81 mg by mouth daily.     . clopidogrel (PLAVIX) 75 MG tablet Take 75 mg by mouth daily.     . indomethacin (INDOCIN) 50 MG capsule Take 50 mg by mouth 2 (two) times daily as needed (gout flare-up). Reported on 08/01/2015    . Insulin Aspart Prot & Aspart (NOVOLOG MIX 70/30 [place]) Inject 80 Units into the skin 2 (two) times daily.    Marland Kitchen lisinopril (PRINIVIL,ZESTRIL) 40 MG tablet Take 40 mg by mouth at bedtime.     . metFORMIN (GLUCOPHAGE-XR) 500 MG 24 hr tablet Take 1,000 mg by mouth 2 (two) times daily.     . metoprolol succinate (TOPROL-XL) 25 MG 24 hr tablet Take 25 mg by mouth daily.    . Multiple Vitamin (MULTIVITAMIN WITH MINERALS) TABS tablet Take 1 tablet by mouth daily.    . Omega-3 Fatty Acids (FISH OIL) 1000 MG CAPS Take 2,000 mg by mouth 2 (two) times  daily.     . ONE TOUCH ULTRA TEST test strip 3 (three) times daily. for testing  0  . Semaglutide (OZEMPIC, 0.25 OR 0.5 MG/DOSE, [place]) Inject into the skin once a week.    . simvastatin (ZOCOR) 20 MG tablet Take 20 mg by mouth at bedtime.     Marland Kitchen glimepiride (AMARYL) 4 MG tablet Take 4 mg by mouth 2 (two) times daily.  (Patient not taking: Reported on 05/31/2020)     No current facility-administered medications for this visit.    Allergies as of 05/31/2020 - Review Complete 05/31/2020  Allergen Reaction Noted  . Aspirin Hives   . Penicillins Rash and Other (See Comments)      ROS:  General: Negative for anorexia, weight loss, fever, chills, fatigue, weakness. Eyes: Negative for vision changes.  ENT: Negative for hoarseness, difficulty swallowing , nasal congestion. CV: Negative for chest pain, angina, palpitations, dyspnea on exertion, peripheral edema.  Respiratory: Negative for dyspnea at rest, dyspnea on exertion, cough, sputum, wheezing.  GI: See history of present illness. GU:  Negative for dysuria, hematuria, urinary incontinence, urinary frequency, nocturnal urination.  MS: Negative for joint pain, low back pain.  Derm: Negative for rash or itching.  Neuro: Negative for weakness, abnormal sensation, seizure, frequent headaches, memory loss, confusion.  Psych: Negative for anxiety, depression, suicidal ideation,  hallucinations.  Endo: Negative for unusual weight change.  Heme: Negative for bruising or bleeding. Allergy: Negative for rash or hives.    Physical Examination:  BP 135/74   Pulse 98   Temp (!) 97.5 F (36.4 C) (Temporal)   Ht 5\' 9"  (1.753 m)   Wt 223 lb 3.2 oz (101.2 kg)   BMI 32.96 kg/m    General: Well-nourished, well-developed in no acute distress.  Head: Normocephalic, atraumatic.   Eyes: Conjunctiva pink, no icterus. Mouth: masked Neck: Supple without thyromegaly, masses, or lymphadenopathy.  Lungs: Clear to auscultation bilaterally.  Heart: Regular  rate and rhythm, no murmurs rubs or gallops.  Abdomen: Bowel sounds are normal, nontender, nondistended, no hepatosplenomegaly or masses, no abdominal bruits or hernia , no rebound or guarding.   Rectal: not performed Extremities: No lower extremity edema. No clubbing or deformities.  Neuro: Alert and oriented x 4 , grossly normal neurologically.  Skin: Warm and dry, no rash or jaundice.   Psych: Alert and cooperative, normal mood and affect.  Imaging Studies: No results found.  Impression/plan:  Pleasant 75 year old male presenting to schedule 5 year surveillance colonoscopy for history of adenomatous colon polyps.  Doing well from a GI standpoint.  Per current guidelines, given his age, he can forego future screening/surveillance colonoscopies.  Discussed with him today that we would be more than happy to offer him 1 more colonoscopy or he could appropriately opt out due to current guidelines.  We offered iFOBT and if positive pursue colonoscopy as part of his treatment plan today.  He states he has a follow-up with Dr. Manuella Ghazi in a couple of weeks and anticipates blood work and stool Hemoccult at that time.  He will also discuss above recommendations with PCP.  At any time if he decides he wants to pursue 1 additional colonoscopy, he can let us know and we will make arrangements.  Also if he develops any change in bowels, rectal bleeding, anemia he would need a diagnostic colonoscopy.  At that time.

## 2020-05-31 NOTE — Patient Instructions (Signed)
1. Please let us know if your stool test comes back positive for blood or you change your mind about having a colonoscopy. 2. If you having change in bowels, blood in stool, or anemia then we would recommend a colonoscopy.

## 2020-06-19 DIAGNOSIS — N183 Chronic kidney disease, stage 3 unspecified: Secondary | ICD-10-CM | POA: Diagnosis not present

## 2020-06-19 DIAGNOSIS — E1122 Type 2 diabetes mellitus with diabetic chronic kidney disease: Secondary | ICD-10-CM | POA: Diagnosis not present

## 2020-06-19 DIAGNOSIS — I129 Hypertensive chronic kidney disease with stage 1 through stage 4 chronic kidney disease, or unspecified chronic kidney disease: Secondary | ICD-10-CM | POA: Diagnosis not present

## 2020-06-19 DIAGNOSIS — E7849 Other hyperlipidemia: Secondary | ICD-10-CM | POA: Diagnosis not present

## 2020-07-31 DIAGNOSIS — Z299 Encounter for prophylactic measures, unspecified: Secondary | ICD-10-CM | POA: Diagnosis not present

## 2020-07-31 DIAGNOSIS — N183 Chronic kidney disease, stage 3 unspecified: Secondary | ICD-10-CM | POA: Diagnosis not present

## 2020-07-31 DIAGNOSIS — J449 Chronic obstructive pulmonary disease, unspecified: Secondary | ICD-10-CM | POA: Diagnosis not present

## 2020-07-31 DIAGNOSIS — M79675 Pain in left toe(s): Secondary | ICD-10-CM | POA: Diagnosis not present

## 2020-07-31 DIAGNOSIS — I10 Essential (primary) hypertension: Secondary | ICD-10-CM | POA: Diagnosis not present

## 2020-07-31 DIAGNOSIS — I998 Other disorder of circulatory system: Secondary | ICD-10-CM | POA: Diagnosis not present

## 2020-08-07 ENCOUNTER — Other Ambulatory Visit: Payer: Self-pay | Admitting: *Deleted

## 2020-08-07 DIAGNOSIS — I739 Peripheral vascular disease, unspecified: Secondary | ICD-10-CM

## 2020-08-10 ENCOUNTER — Ambulatory Visit (INDEPENDENT_AMBULATORY_CARE_PROVIDER_SITE_OTHER): Payer: Medicare Other

## 2020-08-10 ENCOUNTER — Other Ambulatory Visit: Payer: Self-pay

## 2020-08-10 DIAGNOSIS — I739 Peripheral vascular disease, unspecified: Secondary | ICD-10-CM

## 2020-08-14 ENCOUNTER — Ambulatory Visit (INDEPENDENT_AMBULATORY_CARE_PROVIDER_SITE_OTHER): Payer: Medicare Other | Admitting: Vascular Surgery

## 2020-08-14 ENCOUNTER — Encounter: Payer: Self-pay | Admitting: Vascular Surgery

## 2020-08-14 ENCOUNTER — Other Ambulatory Visit: Payer: Self-pay

## 2020-08-14 VITALS — BP 153/80 | HR 94 | Temp 98.9°F | Resp 18 | Ht 69.0 in | Wt 222.0 lb

## 2020-08-14 DIAGNOSIS — I739 Peripheral vascular disease, unspecified: Secondary | ICD-10-CM

## 2020-08-14 NOTE — Progress Notes (Signed)
[facility]  Patient name: Logan [name] MRN: [id number] DOB: 10-04-1945 Sex: male  REASON FOR CONSULT: Evaluation lower extremity arterial insufficiency  HPI: Logan [name] is a 75 y.o. male, who is here today for evaluation.  He has an extremely complex past history.  I do not have all of his old records but it appears that he had a right to left femorofemoral bypass in 1997 with Dr. Kellie Simmering.  He subsequently had occlusion of this and underwent aortobifemoral bypass in 2005.  He had Logan [name] of the left limb of his aortofemoral bypass and underwent a right to left femorofemoral bypass in 2006.  He had multiple issues with following this including admissions with critical limb ischemia and underwent emergent thrombectomy with axillofemoral bypass placement and fasciotomies in his right leg.  His most recent visit with [place] in our office with with Dr. Geryl Councilman in 2018.  At that time he was found to have occlusion of his axillofemoral and femorofemoral bypasses.  Ankle arm index in August 2018 was 0.43 on the right and 0.52 on the left.  He recently had an episode where he had spontaneous loss of the toenail on his left third toe.  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  He does note some darkening of his toe but has no pain.  He specifically has not awakened at night with pain.  He has had healing of the nailbed.  Past Medical History:  Diagnosis Date  . Anginal pain ([place])   . Cardiomyopathy ([place])   . Cataracts, bilateral   . Coronary artery disease    taxis DES stent circumflex 02/2003.... residual total distal circumflex and 50% LAD/ cardiolite 08/2006 inferior scar no ischemia  . Coronary artery disease   . Degenerative disc disease, lumbar   . Diabetes mellitus    type 2  . Dyslipidemia   . History of blood transfusion   . Hypertension   . Myocardial infarction ([place])   . Overweight(278.02)   . PVD (peripheral  vascular disease) ([place])    right to left fem -fem bypass 1997/ aortabifemoral bypass 2005 right to left fem-fem 07/2004. Secondary to occluded righ tlimb of aortobifemoral bypass  . Shortness of breath   . Tobacco abuse     Family History  Problem Relation Age of Onset  . Hypertension Mother   . Varicose Veins Mother   . Hypertension Brother   . Colon cancer Neg Hx     SOCIAL HISTORY: Social History   Socioeconomic History  . Marital status: Married    Spouse name: Not on file  . Number of children: Not on file  . Years of education: Not on file  . Highest education level: Not on file  Occupational History  . Not on file  Tobacco Use  . Smoking status: Former Smoker    Years: 40.00    Types: Cigars    Quit date: 04/20/2014    Years since quitting: 6.3  . Smokeless tobacco: Never Used  . Tobacco comment: Quit x 2 years off and on  Vaping Use  . Vaping Use: Never used  Substance and Sexual Activity  . Alcohol use: Yes    Alcohol/week: 0.0 standard drinks    Comment: liquior sometimes, seldom  . Drug use: No  . Sexual activity: Not on file  Other Topics Concern  . Not on file  Social History Narrative  . Not on file   Social  Determinants of Health   Financial Resource Strain: Not on file  Food Insecurity: Not on file  Transportation Needs: Not on file  Physical Activity: Not on file  Stress: Not on file  Social Connections: Not on file  Intimate Partner Violence: Not on file    Allergies  Allergen Reactions  . Aspirin Hives    Tolerates low dose aspirin  . Penicillins Rash and Other (See Comments)    Immune system shut down     Current Outpatient Medications  Medication Sig Dispense Refill  . amLODipine (NORVASC) 10 MG tablet Take 10 mg by mouth daily.     Marland Kitchen aspirin EC 81 MG tablet Take 81 mg by mouth daily.     . clopidogrel (PLAVIX) 75 MG tablet Take 75 mg by mouth daily.     . indomethacin (INDOCIN) 50 MG capsule Take 50 mg by mouth 2 (two) times  daily as needed (gout flare-up). Reported on 08/01/2015    . Insulin Aspart Prot & Aspart (NOVOLOG MIX 70/30 [place]) Inject 80 Units into the skin 2 (two) times daily.    Marland Kitchen lisinopril (PRINIVIL,ZESTRIL) 40 MG tablet Take 40 mg by mouth at bedtime.     . metFORMIN (GLUCOPHAGE-XR) 500 MG 24 hr tablet Take 1,000 mg by mouth 2 (two) times daily.     . metoprolol succinate (TOPROL-XL) 25 MG 24 hr tablet Take 25 mg by mouth daily.    . Multiple Vitamin (MULTIVITAMIN WITH MINERALS) TABS tablet Take 1 tablet by mouth daily.    . Omega-3 Fatty Acids (FISH OIL) 1000 MG CAPS Take 2,000 mg by mouth 2 (two) times daily.     . ONE TOUCH ULTRA TEST test strip 3 (three) times daily. for testing  0  . Semaglutide (OZEMPIC, 0.25 OR 0.5 MG/DOSE, [place]) Inject into the skin once a week.    . simvastatin (ZOCOR) 20 MG tablet Take 20 mg by mouth at bedtime.      No current facility-administered medications for this visit.    REVIEW OF SYSTEMS:  [X]  denotes positive finding, [ ]  denotes negative finding Cardiac  Comments:  Chest pain or chest pressure:    Shortness of breath upon exertion: x   Short of breath when lying flat:    Irregular heart rhythm:        Vascular    Pain in calf, thigh, or hip brought on by ambulation: x   Pain in feet at night that wakes you up from your sleep:     Blood clot in your veins:    Leg swelling:  x       Pulmonary    Oxygen at home:    Productive cough:     Wheezing:         Neurologic    Sudden weakness in arms or legs:     Sudden numbness in arms or legs:     Sudden onset of difficulty speaking or slurred speech:    Temporary loss of vision in one eye:     Problems with dizziness:         Gastrointestinal    Blood in stool:     Vomited blood:         Genitourinary    Burning when urinating:     Blood in urine:        Psychiatric    Major depression:         Hematologic    Bleeding problems:    Problems with blood  clotting too easily:        Skin    Rashes  or ulcers:        Constitutional    Fever or chills:      PHYSICAL EXAM: Vitals:   08/14/20 0912  BP: (!) 153/80  Pulse: 94  Resp: 18  Temp: 98.9 F (37.2 C)  TempSrc: Other (Comment)  SpO2: 98%  Weight: 222 lb (100.7 kg)  Height: 5\' 9"  (1.753 m)    GENERAL: The patient is a well-nourished male, in no acute distress. The vital signs are documented above. CARDIOVASCULAR: I do not palpate any pulse in his left axillofemoral bypass.  I do not palpate femoral pulses or distal pulses bilaterally PULMONARY: There is good air exchange  MUSCULOSKELETAL: There are no major deformities or cyanosis. NEUROLOGIC: No focal weakness or paresthesias are detected. SKIN: There are no ulcers or rashes noted.  The toenail on the left looks quite good with no evidence of difficulty healing.  There is some slight darkening on the dorsum of his left third toe versus the other toes PSYCHIATRIC: The patient has a normal affect.  DATA:  Ankle arm index from 08/10/2020 reveal ankle arm index of 0.46 on the right and 0.46 on the left  MEDICAL ISSUES: Had long discussion with the patient.  He has had a very difficult course of events over the last 25 years.  Despite this he still is ambulating.  He does have claudication symptoms but no rest pain.  He appears to be healing the toe amputation despite marginal flow.  I would not recommend any further evaluation at this time.  If he develops any worsening tissue loss or rest pain would recommend arteriogram for further evaluation and determine bypass options.  Will notify should he develop any new difficulties   Rosetta Posner, MD [facility] [phone] Pager [phone]  Note: Portions of this report may have been transcribed using voice recognition software.  Every effort has been made to ensure accuracy; however, inadvertent computerized transcription errors may still be present.

## 2020-08-16 DIAGNOSIS — N183 Chronic kidney disease, stage 3 unspecified: Secondary | ICD-10-CM | POA: Diagnosis not present

## 2020-08-16 DIAGNOSIS — E1122 Type 2 diabetes mellitus with diabetic chronic kidney disease: Secondary | ICD-10-CM | POA: Diagnosis not present

## 2020-08-16 DIAGNOSIS — I10 Essential (primary) hypertension: Secondary | ICD-10-CM | POA: Diagnosis not present

## 2020-08-16 DIAGNOSIS — E7849 Other hyperlipidemia: Secondary | ICD-10-CM | POA: Diagnosis not present

## 2020-09-16 DIAGNOSIS — I10 Essential (primary) hypertension: Secondary | ICD-10-CM | POA: Diagnosis not present

## 2020-09-16 DIAGNOSIS — E1142 Type 2 diabetes mellitus with diabetic polyneuropathy: Secondary | ICD-10-CM | POA: Diagnosis not present

## 2020-09-16 DIAGNOSIS — E7849 Other hyperlipidemia: Secondary | ICD-10-CM | POA: Diagnosis not present

## 2020-10-05 DIAGNOSIS — R2681 Unsteadiness on feet: Secondary | ICD-10-CM | POA: Diagnosis not present

## 2020-10-05 DIAGNOSIS — Z0000 Encounter for general adult medical examination without abnormal findings: Secondary | ICD-10-CM | POA: Diagnosis not present

## 2020-10-05 DIAGNOSIS — Z1339 Encounter for screening examination for other mental health and behavioral disorders: Secondary | ICD-10-CM | POA: Diagnosis not present

## 2020-10-05 DIAGNOSIS — E1165 Type 2 diabetes mellitus with hyperglycemia: Secondary | ICD-10-CM | POA: Diagnosis not present

## 2020-10-05 DIAGNOSIS — E1169 Type 2 diabetes mellitus with other specified complication: Secondary | ICD-10-CM | POA: Diagnosis not present

## 2020-10-05 DIAGNOSIS — Z7189 Other specified counseling: Secondary | ICD-10-CM | POA: Diagnosis not present

## 2020-10-05 DIAGNOSIS — Z1331 Encounter for screening for depression: Secondary | ICD-10-CM | POA: Diagnosis not present

## 2020-10-05 DIAGNOSIS — I10 Essential (primary) hypertension: Secondary | ICD-10-CM | POA: Diagnosis not present

## 2020-10-05 DIAGNOSIS — J449 Chronic obstructive pulmonary disease, unspecified: Secondary | ICD-10-CM | POA: Diagnosis not present

## 2020-10-05 DIAGNOSIS — Z299 Encounter for prophylactic measures, unspecified: Secondary | ICD-10-CM | POA: Diagnosis not present

## 2020-10-05 DIAGNOSIS — Z6832 Body mass index (BMI) 32.0-32.9, adult: Secondary | ICD-10-CM | POA: Diagnosis not present

## 2020-10-06 DIAGNOSIS — Z125 Encounter for screening for malignant neoplasm of prostate: Secondary | ICD-10-CM | POA: Diagnosis not present

## 2020-10-06 DIAGNOSIS — R5383 Other fatigue: Secondary | ICD-10-CM | POA: Diagnosis not present

## 2020-10-06 DIAGNOSIS — E7800 Pure hypercholesterolemia, unspecified: Secondary | ICD-10-CM | POA: Diagnosis not present

## 2020-10-06 DIAGNOSIS — Z79899 Other long term (current) drug therapy: Secondary | ICD-10-CM | POA: Diagnosis not present

## 2020-11-16 DIAGNOSIS — E7849 Other hyperlipidemia: Secondary | ICD-10-CM | POA: Diagnosis not present

## 2020-11-16 DIAGNOSIS — E1142 Type 2 diabetes mellitus with diabetic polyneuropathy: Secondary | ICD-10-CM | POA: Diagnosis not present

## 2020-11-16 DIAGNOSIS — I10 Essential (primary) hypertension: Secondary | ICD-10-CM | POA: Diagnosis not present

## 2020-11-30 DIAGNOSIS — J449 Chronic obstructive pulmonary disease, unspecified: Secondary | ICD-10-CM | POA: Diagnosis not present

## 2020-11-30 DIAGNOSIS — F17210 Nicotine dependence, cigarettes, uncomplicated: Secondary | ICD-10-CM | POA: Diagnosis not present

## 2020-11-30 DIAGNOSIS — Z6832 Body mass index (BMI) 32.0-32.9, adult: Secondary | ICD-10-CM | POA: Diagnosis not present

## 2020-11-30 DIAGNOSIS — N183 Chronic kidney disease, stage 3 unspecified: Secondary | ICD-10-CM | POA: Diagnosis not present

## 2020-11-30 DIAGNOSIS — Z299 Encounter for prophylactic measures, unspecified: Secondary | ICD-10-CM | POA: Diagnosis not present

## 2020-11-30 DIAGNOSIS — E1165 Type 2 diabetes mellitus with hyperglycemia: Secondary | ICD-10-CM | POA: Diagnosis not present

## 2020-11-30 DIAGNOSIS — R030 Elevated blood-pressure reading, without diagnosis of hypertension: Secondary | ICD-10-CM | POA: Diagnosis not present

## 2020-11-30 DIAGNOSIS — E1122 Type 2 diabetes mellitus with diabetic chronic kidney disease: Secondary | ICD-10-CM | POA: Diagnosis not present

## 2020-11-30 DIAGNOSIS — I10 Essential (primary) hypertension: Secondary | ICD-10-CM | POA: Diagnosis not present

## 2020-12-17 DIAGNOSIS — I10 Essential (primary) hypertension: Secondary | ICD-10-CM | POA: Diagnosis not present

## 2020-12-17 DIAGNOSIS — E1142 Type 2 diabetes mellitus with diabetic polyneuropathy: Secondary | ICD-10-CM | POA: Diagnosis not present

## 2020-12-17 DIAGNOSIS — E7849 Other hyperlipidemia: Secondary | ICD-10-CM | POA: Diagnosis not present

## 2021-01-11 DIAGNOSIS — Z89619 Acquired absence of unspecified leg above knee: Secondary | ICD-10-CM | POA: Diagnosis not present

## 2021-01-11 DIAGNOSIS — E1165 Type 2 diabetes mellitus with hyperglycemia: Secondary | ICD-10-CM | POA: Diagnosis not present

## 2021-01-11 DIAGNOSIS — Z6832 Body mass index (BMI) 32.0-32.9, adult: Secondary | ICD-10-CM | POA: Diagnosis not present

## 2021-01-11 DIAGNOSIS — M79606 Pain in leg, unspecified: Secondary | ICD-10-CM | POA: Diagnosis not present

## 2021-01-11 DIAGNOSIS — E1169 Type 2 diabetes mellitus with other specified complication: Secondary | ICD-10-CM | POA: Diagnosis not present

## 2021-01-11 DIAGNOSIS — I10 Essential (primary) hypertension: Secondary | ICD-10-CM | POA: Diagnosis not present

## 2021-01-11 DIAGNOSIS — Z299 Encounter for prophylactic measures, unspecified: Secondary | ICD-10-CM | POA: Diagnosis not present

## 2021-01-11 DIAGNOSIS — F17210 Nicotine dependence, cigarettes, uncomplicated: Secondary | ICD-10-CM | POA: Diagnosis not present

## 2021-01-15 DIAGNOSIS — I70203 Unspecified atherosclerosis of native arteries of extremities, bilateral legs: Secondary | ICD-10-CM | POA: Diagnosis not present

## 2021-01-15 DIAGNOSIS — I70211 Atherosclerosis of native arteries of extremities with intermittent claudication, right leg: Secondary | ICD-10-CM | POA: Diagnosis not present

## 2021-04-18 DIAGNOSIS — E1165 Type 2 diabetes mellitus with hyperglycemia: Secondary | ICD-10-CM | POA: Diagnosis not present

## 2021-04-18 DIAGNOSIS — I10 Essential (primary) hypertension: Secondary | ICD-10-CM | POA: Diagnosis not present

## 2021-04-18 DIAGNOSIS — B079 Viral wart, unspecified: Secondary | ICD-10-CM | POA: Diagnosis not present

## 2021-04-18 DIAGNOSIS — Z299 Encounter for prophylactic measures, unspecified: Secondary | ICD-10-CM | POA: Diagnosis not present

## 2021-04-18 DIAGNOSIS — E1122 Type 2 diabetes mellitus with diabetic chronic kidney disease: Secondary | ICD-10-CM | POA: Diagnosis not present

## 2021-04-18 DIAGNOSIS — Z6831 Body mass index (BMI) 31.0-31.9, adult: Secondary | ICD-10-CM | POA: Diagnosis not present

## 2021-04-18 DIAGNOSIS — Z2821 Immunization not carried out because of patient refusal: Secondary | ICD-10-CM | POA: Diagnosis not present

## 2021-05-15 DIAGNOSIS — W0110XA Fall on same level from slipping, tripping and stumbling with subsequent striking against unspecified object, initial encounter: Secondary | ICD-10-CM | POA: Diagnosis not present

## 2021-05-15 DIAGNOSIS — G319 Degenerative disease of nervous system, unspecified: Secondary | ICD-10-CM | POA: Diagnosis not present

## 2021-05-15 DIAGNOSIS — R519 Headache, unspecified: Secondary | ICD-10-CM | POA: Diagnosis not present

## 2021-05-15 DIAGNOSIS — S0990XA Unspecified injury of head, initial encounter: Secondary | ICD-10-CM | POA: Diagnosis not present

## 2021-05-15 DIAGNOSIS — S0101XA Laceration without foreign body of scalp, initial encounter: Secondary | ICD-10-CM | POA: Diagnosis not present

## 2021-05-15 DIAGNOSIS — M47812 Spondylosis without myelopathy or radiculopathy, cervical region: Secondary | ICD-10-CM | POA: Diagnosis not present

## 2021-05-15 DIAGNOSIS — M542 Cervicalgia: Secondary | ICD-10-CM | POA: Diagnosis not present

## 2021-05-15 DIAGNOSIS — M2578 Osteophyte, vertebrae: Secondary | ICD-10-CM | POA: Diagnosis not present
